# Patient Record
Sex: Male | Born: 1940
Health system: Southern US, Community
[De-identification: ages and names within clinical notes are randomized; demographics above are authoritative.]

## PROBLEM LIST (undated history)

## (undated) DIAGNOSIS — G43909 Migraine, unspecified, not intractable, without status migrainosus: Secondary | ICD-10-CM

## (undated) DIAGNOSIS — I10 Essential (primary) hypertension: Secondary | ICD-10-CM

## (undated) DIAGNOSIS — H269 Unspecified cataract: Secondary | ICD-10-CM

## (undated) DIAGNOSIS — J690 Pneumonitis due to inhalation of food and vomit: Secondary | ICD-10-CM

## (undated) DIAGNOSIS — K219 Gastro-esophageal reflux disease without esophagitis: Secondary | ICD-10-CM

## (undated) DIAGNOSIS — K279 Peptic ulcer, site unspecified, unspecified as acute or chronic, without hemorrhage or perforation: Secondary | ICD-10-CM

## (undated) DIAGNOSIS — K254 Chronic or unspecified gastric ulcer with hemorrhage: Secondary | ICD-10-CM

## (undated) DIAGNOSIS — N390 Urinary tract infection, site not specified: Secondary | ICD-10-CM

## (undated) DIAGNOSIS — A419 Sepsis, unspecified organism: Secondary | ICD-10-CM

## (undated) DIAGNOSIS — T7840XA Allergy, unspecified, initial encounter: Secondary | ICD-10-CM

## (undated) DIAGNOSIS — Z8673 Personal history of transient ischemic attack (TIA), and cerebral infarction without residual deficits: Secondary | ICD-10-CM

## (undated) DIAGNOSIS — Z9289 Personal history of other medical treatment: Secondary | ICD-10-CM

## (undated) DIAGNOSIS — R011 Cardiac murmur, unspecified: Secondary | ICD-10-CM

## (undated) DIAGNOSIS — M1990 Unspecified osteoarthritis, unspecified site: Secondary | ICD-10-CM

## (undated) DIAGNOSIS — T4145XA Adverse effect of unspecified anesthetic, initial encounter: Secondary | ICD-10-CM

## (undated) HISTORY — DX: Peptic ulcer, site unspecified, unspecified as acute or chronic, without hemorrhage or perforation: K27.9

## (undated) HISTORY — DX: Pneumonitis due to inhalation of food and vomit: J69.0

## (undated) HISTORY — DX: Sepsis, unspecified organism: A41.9

## (undated) HISTORY — PX: CATARACT EXTRACTION W/ INTRAOCULAR LENS  IMPLANT, BILATERAL: SHX1307

## (undated) HISTORY — DX: Allergy, unspecified, initial encounter: T78.40XA

## (undated) HISTORY — DX: Unspecified cataract: H26.9

## (undated) HISTORY — PX: COLONOSCOPY: SHX174

## (undated) HISTORY — DX: Urinary tract infection, site not specified: N39.0

## (undated) HISTORY — PX: LAPAROSCOPIC CHOLECYSTECTOMY: SUR755

---

## 1986-04-05 DIAGNOSIS — K254 Chronic or unspecified gastric ulcer with hemorrhage: Secondary | ICD-10-CM

## 1986-04-05 DIAGNOSIS — Z9289 Personal history of other medical treatment: Secondary | ICD-10-CM

## 1986-04-05 HISTORY — DX: Chronic or unspecified gastric ulcer with hemorrhage: K25.4

## 1986-04-05 HISTORY — DX: Personal history of other medical treatment: Z92.89

## 1991-04-06 DIAGNOSIS — T8859XA Other complications of anesthesia, initial encounter: Secondary | ICD-10-CM

## 1991-04-06 HISTORY — DX: Other complications of anesthesia, initial encounter: T88.59XA

## 1991-04-06 HISTORY — PX: EXCISIONAL HEMORRHOIDECTOMY: SHX1541

## 2009-10-09 ENCOUNTER — Encounter (INDEPENDENT_AMBULATORY_CARE_PROVIDER_SITE_OTHER): Payer: Self-pay | Admitting: *Deleted

## 2009-10-09 ENCOUNTER — Ambulatory Visit: Payer: Self-pay | Admitting: Internal Medicine

## 2009-10-09 DIAGNOSIS — R1012 Left upper quadrant pain: Secondary | ICD-10-CM

## 2009-10-17 ENCOUNTER — Ambulatory Visit: Payer: Self-pay | Admitting: Internal Medicine

## 2009-10-20 ENCOUNTER — Telehealth: Payer: Self-pay | Admitting: Internal Medicine

## 2009-10-31 ENCOUNTER — Ambulatory Visit: Payer: Self-pay | Admitting: Internal Medicine

## 2009-10-31 ENCOUNTER — Telehealth: Payer: Self-pay | Admitting: Internal Medicine

## 2009-10-31 ENCOUNTER — Inpatient Hospital Stay (HOSPITAL_COMMUNITY): Admission: EM | Admit: 2009-10-31 | Discharge: 2009-11-02 | Payer: Self-pay | Admitting: Emergency Medicine

## 2009-10-31 ENCOUNTER — Encounter (INDEPENDENT_AMBULATORY_CARE_PROVIDER_SITE_OTHER): Payer: Self-pay | Admitting: Surgery

## 2010-01-07 ENCOUNTER — Emergency Department (HOSPITAL_COMMUNITY): Admission: EM | Admit: 2010-01-07 | Discharge: 2010-01-07 | Payer: Self-pay | Admitting: Emergency Medicine

## 2010-05-05 NOTE — Progress Notes (Signed)
Summary: wants PPI  ---- Converted from flag ---- ---- 10/20/2009 8:01 AM, Clide Cliff RN wrote: Jeanene Erb pt back this am...wants PPI or something for discomfort...he assumes this discomfort is related to his gastritis/not new same as before procedure ------------------------------     Follow-up for Phone Call       Follow-up by: Iva Boop MD, Clementeen Graham,  October 20, 2009 8:09 AM    Additional Follow-up for Phone Call Additional follow up Details #2::    let him know that I will recommend treatment once path back (this week) Iva Boop MD, [facility]  October 20, 2009 8:09 AM    Informed patient that Dr. will notify him of treatment plan once the pathology is back sometime this week. Follow-up by: Clide Cliff RN,  October 20, 2009 12:25 PM

## 2010-05-05 NOTE — Letter (Signed)
Summary: [facility] Instructions  [facility]  [address] [place], [place] [address]   Phone: [phone]  Fax: [phone]       CORBETT MOULDER    [birth year]    MRN: [id number]      Procedure Day Dorna Bloom: Farrell Ours, 10/17/09     Arrival Time: 1:00 PM      Procedure Time: 2:00 PM    Location of Procedure:                    _X_  [facility] (4th Floor)                    PREPARATION FOR COLONOSCOPY WITH MOVIPREP   Starting 5 days prior to your procedure 10/12/09 do not eat nuts, seeds, popcorn, corn, beans, peas,  salads, or any raw vegetables.  Do not take any fiber supplements (e.g. Metamucil, Citrucel, and Benefiber).  THE DAY BEFORE YOUR PROCEDURE         THURSDAY, 10/16/09  1.  Drink clear liquids the entire day-NO SOLID FOOD  2.  Do not drink anything colored red or purple.  Avoid juices with pulp.  No orange juice.  3.  Drink at least 64 oz. (8 glasses) of fluid/clear liquids during the day to prevent dehydration and help the prep work efficiently.  CLEAR LIQUIDS INCLUDE: Water Jello Ice Popsicles Tea (sugar ok, no milk/cream) Powdered fruit flavored drinks Coffee (sugar ok, no milk/cream) Gatorade Juice: apple, white grape, white cranberry  Lemonade Clear bullion, consomm, broth Carbonated beverages (any kind) Strained chicken noodle soup Hard Candy                             4.  In the morning, mix first dose of MoviPrep solution:    Empty 1 Pouch A and 1 Pouch B into the disposable container    Add lukewarm drinking water to the top line of the container. Mix to dissolve    Refrigerate (mixed solution should be used within 24 hrs)  5.  Begin drinking the prep at 5:00 p.m. The MoviPrep container is divided by 4 marks.   Every 15 minutes drink the solution down to the next mark (approximately 8 oz) until the full liter is complete.   6.  Follow completed prep with 16 oz of clear liquid of your choice (Nothing red or purple).   Continue to drink clear liquids until bedtime.  7.  Before going to bed, mix second dose of MoviPrep solution:    Empty 1 Pouch A and 1 Pouch B into the disposable container    Add lukewarm drinking water to the top line of the container. Mix to dissolve    Refrigerate  THE DAY OF YOUR PROCEDURE      FRIDAY, 10/17/09  Beginning at 9:00 a.m. (5 hours before procedure):         1. Every 15 minutes, drink the solution down to the next mark (approx 8 oz) until the full liter is complete.  2. Follow completed prep with 16 oz. of clear liquid of your choice.    3. You may drink clear liquids until 12:00 PM (2 HOURS BEFORE PROCEDURE).   MEDICATION INSTRUCTIONS  Unless otherwise instructed, you should take regular prescription medications with a small sip of water   as early as possible the morning of your procedure.       OTHER INSTRUCTIONS  You  will need a responsible adult at least 68 years of age to accompany you and drive you home.   This person must remain in the waiting room during your procedure.  Wear loose fitting clothing that is easily removed.  Leave jewelry and other valuables at home.  However, you may wish to bring a book to read or  an iPod/MP3 player to listen to music as you wait for your procedure to start.  Remove all body piercing jewelry and leave at home.  Total time from sign-in until discharge is approximately 2-3 hours.  You should go home directly after your procedure and rest.  You can resume normal activities the  day after your procedure.  The day of your procedure you should not:   Drive   Make legal decisions   Operate machinery   Drink alcohol   Return to work  You will receive specific instructions about eating, activities and medications before you leave.  The above instructions have been reviewed and explained to me by   Francee Piccolo, CMA (AAMA)    I fully understand and can verbalize these instructions  _____________________________ Date 10/09/09

## 2010-05-05 NOTE — Procedures (Signed)
Summary: Upper Endoscopy  Patient: Steven Simmons Note: All result statuses are Final unless otherwise noted.  Tests: (1) Upper Endoscopy (EGD)   EGD Upper Endoscopy       DONE     [facility]     [address]     [place], [place]  [address]           ENDOSCOPY PROCEDURE REPORT           PATIENT:  [name], [name]  MR#:  [id number]     BIRTHDATE:  02-25-1941, 69 yrs. old  GENDER:  male           ENDOSCOPIST:  Iva Boop, MD, [facility]     Referred by:  Robert Bellow, M.D.           PROCEDURE DATE:  10/17/2009     PROCEDURE:  EGD with biopsy     ASA CLASS:  Class II     INDICATIONS:  abdominal pain, left upper quadr           MEDICATIONS:   Fentanyl 75 mcg IV, Versed 7 mg IV     TOPICAL ANESTHETIC:  Exactacain Spray           DESCRIPTION OF PROCEDURE:   After the risks benefits and     alternatives of the procedure were thoroughly explained, informed     consent was obtained.  The LB GIF-H180 [id number] endoscope was     introduced through the mouth and advanced to the second portion of     the duodenum, without limitations.  The instrument was slowly     withdrawn as the mucosa was fully examined.     <<PROCEDUREIMAGES>>           Mild gastritis was found in the body and the antrum of the     stomach. Multiple biopsies were obtained and sent to pathology.     Otherwise the examination was normal.    Retroflexed views revealed     no abnormalities.    The scope was then withdrawn from the patient     and the procedure completed.           COMPLICATIONS:  None           ENDOSCOPIC IMPRESSION:     1) Mild gastritis in the body and the antrum of the stomach     2) Otherwise normal examination     RECOMMENDATIONS:     1) Await biopsy results     will call           REPEAT EXAM:  In for as needed.           Iva Boop, MD, Clementeen Graham           CC:  The Patient     [facility]           n.     eSIGNED:   Iva Boop at 10/17/2009 02:59 PM           Geronimo Boot, [id number]  Note: An exclamation mark (!) indicates a result that was not dispersed into the flowsheet. Document Creation Date: 10/17/2009 3:01 PM _______________________________________________________________________  (1) Order result status: Final Collection or observation date-time: 10/17/2009 14:41 Requested date-time:  Receipt date-time:  Reported date-time:  Referring Physician:   Ordering Physician: Stan Head [phone]) Specimen Source:  Source: Launa Grill Order Number: [id number] Lab site:

## 2010-05-05 NOTE — Procedures (Signed)
Summary: Colonoscopy  Patient: Steven Simmons Note: All result statuses are Final unless otherwise noted.  Tests: (1) Colonoscopy (COL)   COL Colonoscopy           DONE     [facility]     [address]     [place], [place]  [address]           COLONOSCOPY PROCEDURE REPORT           PATIENT:  Steven Simmons, Steven Simmons  MR#:  [id number]     BIRTHDATE:  [birth year], 69 yrs. old  GENDER:  male     ENDOSCOPIST:  Iva Boop, MD, FACG     :     PROCEDURE DATE:  10/17/2009     PROCEDURE:  Average-risk screening colonoscopy     G0121     ASA CLASS:  Class II     INDICATIONS:  Elevated Risk Screening, family history of colon     cancer father in 2's     MEDICATIONS:   Fentanyl 25 mcg IV, Versed 3 mg IV, There was     residual sedation effect present from prior procedure.           DESCRIPTION OF PROCEDURE:   After the risks benefits and     alternatives of the procedure were thoroughly explained, informed     consent was obtained.  Digital rectal exam was performed and     revealed no abnormalities and normal prostate.   The LB CF-H180AL     [id number] endoscope was introduced through the anus and advanced to     the cecum, which was identified by both the appendix and ileocecal     valve, without limitations.  The quality of the prep was     excellent, using MoviPrep.  The instrument was then slowly     withdrawn as the colon was fully examined.     Inserton: 2:09 minutes withdrawal: 8:29 minutes     <<PROCEDUREIMAGES>>           FINDINGS:  Severe diverticulosis was found in the right colon.     Severe diverticulosis was found in the sigmoid colon.  This was     otherwise a normal examination of the colon.   Retroflexed views     in the rectum revealed no abnormalities.    The scope was then     withdrawn from the patient and the procedure completed.           COMPLICATIONS:  None     ENDOSCOPIC IMPRESSION:     1) Severe diverticulosis in the right colon     2) Severe  diverticulosis in the sigmoid colon     3) Otherwise normal examination, excellent prep           REPEAT EXAM:  In 5 year(s) for routine colonoscopy for family     history of colon cancer.           Iva Boop, MD, Clementeen Graham           CC:  The Patient           n.     eSIGNED:   Iva Boop at 10/17/2009 03:07 PM           Geronimo Boot, [id number]  Note: An exclamation mark (!) indicates a result that was not dispersed into the flowsheet. Document Creation Date: 10/17/2009 3:08 PM _______________________________________________________________________  (1) Order result status: Final Collection or  observation date-time: 10/17/2009 14:58 Requested date-time:  Receipt date-time:  Reported date-time:  Referring Physician:   Ordering Physician: Stan Head [phone]) Specimen Source:  Source: Launa Grill Order Number: [id number] Lab site:   Appended Document: Colonoscopy     Procedures Next Due Date:    Colonoscopy: 10/2014  Appended Document: Colonoscopy     Procedures Next Due Date:    Colonoscopy: 10/2014

## 2010-05-05 NOTE — Progress Notes (Signed)
Summary: abd pain, distention  Phone Note Call from Patient   Caller: Patient Summary of Call: starting 1 week ago, episodic abdominal pain, fairly intense and moreso this AM 2 Zantac ? a bit better hot shower ? better just a bit GI cocktail at urgent care last week not helpful, was given narcotic also RUQ area with bloating and abdominal distention started Prilosec after EGD/colon 7/15 (gastritis, diverticulosis) no fever no vomiting some back pain and abd muscles are stretched with abdominal distention I advised he go to [facility] ED to be evaluated given fairly sudden abdominal pain, intermittently x 1 week Prilosec can sometimes cause pain but this merits acute evaluation cc: [facility] ED via fax Initial call taken by: Iva Boop MD, Clementeen Graham,  October 31, 2009 4:50 AM

## 2010-05-05 NOTE — Assessment & Plan Note (Signed)
Summary: BLOATING//GAS PAINS--CH.   History of Present Illness Visit Type: Initial Visit Primary GI MD: Stan Head MD [facility] Primary Provider: Joneen Caraway Care Chief Complaint: Bloating/Gas x 52month History of Present Illness:   Worsening problems in last month. LUQ pain that radiates to flank and down side into leg. bicarbonate and water helps, lying on left side helps. Right side aggravates it. Some tenderness also, at times. Sometimes after he eats but not always. Is avoiding certain foods (dairy, chocolate, greasy foods, overeating) to help heartbun. Remembers significant bloating after a sub sandhich also. Has been diagnosed with acid reflux x 2 years. Has used some Zegerid OTC at times and other Rx PPI's at times. Not on any now.   GI Review of Systems    Reports abdominal pain, acid reflux, and  bloating.     Location of  Abdominal pain: right side.    Denies belching, chest pain, dysphagia with liquids, dysphagia with solids, heartburn, loss of appetite, nausea, vomiting, vomiting blood, weight loss, and  weight gain.        Denies anal fissure, black tarry stools, change in bowel habit, constipation, diarrhea, diverticulosis, fecal incontinence, heme positive stool, hemorrhoids, irritable bowel syndrome, jaundice, light color stool, liver problems, rectal bleeding, and  rectal pain. Preventive Screening-Counseling & Management  Alcohol-Tobacco     Alcohol drinks/day: 0     Smoking Status: quit     Passive Smoke Exposure: no     Passive Smoke Counseling: not indicated; no passive smoke exposure  Caffeine-Diet-Exercise     Caffeine use/day: up to 2     Does Patient Exercise: yes     Type of exercise: walking     Times/week: <3      Drug Use:  no.      Current Medications (verified): 1)  Lisinopril-Hydrochlorothiazide 20-25 Mg Tabs (Lisinopril-Hydrochlorothiazide) .... Once Daily  Allergies (verified): 1)  ! Penicillin 2)  ! * Antihistamine 3)  ! 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  Alcohol Use - no Daily Caffeine Use up to 2 Illicit Drug Use - no Lives alone, single Smoking Status:  quit Drug Use:  no Alcohol drinks/day:  0 Passive Smoke Exposure:  no Caffeine use/day:  up to 2 Does Patient Exercise:  yes  Review of Systems       The patient complains of arthritis/joint pain, back pain, and sleeping problems.         All other ROS negative except as per HPI.   Vital Signs:  Patient profile:   70 year old male Height:      71 inches Weight:      225.50 pounds BMI:     31.56 Pulse rate:   88 / minute Pulse rhythm:   regular BP sitting:   164 / 96  (left arm) Cuff size:   large  Vitals Entered By: June McMurray CMA Duncan Dull) (October 09, 2009 2:23 PM)  Physical Exam  General:  obese.  NAD Eyes:  PERRLA, no icterus. Mouth:  No deformity or lesions, dentition  Posterior pharynx. Neck:  Supple; no masses or thyromegaly. Lungs:  Clear throughout to auscultation. Heart:  Regular rate and rhythm; no murmurs, rubs,  or bruits. Abdomen:  obese, soft no HSM/mass small umbilical hernia, non-tender and reducible BS+ Rectal:  deferred until time of colonoscopy.   Extremities:  No clubbing, cyanosis, edema or deformities noted. Neurologic:  Alert and  oriented x3 Skin:  multiple tags, nevi Cervical Nodes:  No significant cervical or supraclavicular adenopathy.  Psych:  Alert and cooperative. Normal mood and affect.   Impression & Recommendations:  Problem # 1:  LUQ PAIN  (ICD-789.02) Assessment New  probably from GERD ? ulcer check H. pylori gven prior ulcers Risks, benefits,and indications of endoscopic procedure(s) were reviewed with the patient and all questions answered.  Orders: Colon/Endo (Colon/Endo)  Problem # 2:  SCREENING, COLON CANCER (ICD-V76.51) Assessment: New  Orders: Colon/Endo (Colon/Endo) Risks, benefits,and indications of endoscopic procedure(s) were reviewed with the patient and all questions answered.  Problem # 3:  ADENOCARCINOMA, COLON, FAMILY HX (ICD-V16.0) Assessment: New  Patient Instructions: 1)  Please pick up your medications at your pharmacy. MOVIPREP 2)  We will see you at your procedure on 10/17/09.  3)  HOME INSTEAD--[phone]--[email or web address] 4)  COMFORT KEEPERS--[phone]--[email or web address] 5)  [facility] Patient Information Guide given to patient.  6)  Colonoscopy and Flexible Sigmoidoscopy brochure given.  7)  Upper Endoscopy brochure given.  8)  Copy sent to : [facility] 9)  The medication list was reviewed and reconciled.  All changed / newly prescribed medications were explained.  A complete medication list was provided to the patient / caregiver. Prescriptions: MOVIPREP 100 GM  SOLR (PEG-KCL-NACL-NASULF-NA ASC-C) As per prep instructions.  #1 x 0   Entered by:   Francee Piccolo CMA (AAMA)   Authorized by:   Iva Boop MD, [facility]   Signed by:   Francee Piccolo CMA (AAMA) on 10/09/2009   Method used:   Electronically to        [facility] [address] [phone]* (retail)       [address]       [place], [place]  [address]       Ph: [phone] or [phone]       Fax: [phone]   RxID:   [id number]

## 2010-06-18 LAB — POCT CARDIAC MARKERS: Myoglobin, poc: 64.6 ng/mL (ref 12–200)

## 2010-06-18 LAB — POCT I-STAT, CHEM 8
BUN: 12 mg/dL (ref 6–23)
Creatinine, Ser: 0.9 mg/dL (ref 0.4–1.5)
HCT: 43 % (ref 39.0–52.0)
Sodium: 139 mEq/L (ref 135–145)
TCO2: 29 mmol/L (ref 0–100)

## 2010-06-20 LAB — CBC
HCT: 37.3 % — ABNORMAL LOW (ref 39.0–52.0)
MCHC: 35 g/dL (ref 30.0–36.0)
RBC: 4.03 MIL/uL — ABNORMAL LOW (ref 4.22–5.81)
RDW: 14.4 % (ref 11.5–15.5)

## 2010-06-20 LAB — URINALYSIS, ROUTINE W REFLEX MICROSCOPIC
Glucose, UA: NEGATIVE mg/dL
Ketones, ur: 15 mg/dL — AB
Leukocytes, UA: NEGATIVE
Nitrite: NEGATIVE
pH: 8.5 — ABNORMAL HIGH (ref 5.0–8.0)

## 2010-06-20 LAB — COMPREHENSIVE METABOLIC PANEL
Albumin: 3.7 g/dL (ref 3.5–5.2)
CO2: 31 mEq/L (ref 19–32)
Chloride: 98 mEq/L (ref 96–112)
GFR calc Af Amer: >60 mL/min (ref >60)
GFR calc non Af Amer: >60 mL/min (ref >60)
Glucose, Bld: 122 mg/dL — ABNORMAL HIGH (ref 70–99)
Total Bilirubin: 0.9 mg/dL (ref 0.3–1.2)
Total Protein: 7.5 g/dL (ref 6.0–8.3)

## 2010-06-20 LAB — DIFFERENTIAL
Basophils Relative: 1 % (ref 0–1)
Eosinophils Absolute: 0.2 10*3/uL (ref 0.0–0.7)
Lymphs Abs: 1.4 10*3/uL (ref 0.7–4.0)
Monocytes Relative: 7 % (ref 3–12)
Neutro Abs: 6.1 10*3/uL (ref 1.7–7.7)
Neutrophils Relative %: 74 % (ref 43–77)

## 2010-06-20 LAB — URINE MICROSCOPIC-ADD ON

## 2010-06-20 LAB — LIPASE, BLOOD: Lipase: 26 U/L (ref 11–59)

## 2010-06-20 LAB — POCT I-STAT 4, (NA,K, GLUC, HGB,HCT): Potassium: 3 mEq/L — ABNORMAL LOW (ref 3.5–5.1)

## 2011-05-18 ENCOUNTER — Ambulatory Visit (INDEPENDENT_AMBULATORY_CARE_PROVIDER_SITE_OTHER): Payer: Medicare Other | Admitting: Internal Medicine

## 2011-05-18 VITALS — BP 181/77 | HR 81 | Temp 98.3°F | Resp 16 | Ht 70.0 in | Wt 237.0 lb

## 2011-05-18 DIAGNOSIS — I10 Essential (primary) hypertension: Secondary | ICD-10-CM

## 2011-05-18 MED ORDER — LISINOPRIL-HYDROCHLOROTHIAZIDE 20-12.5 MG PO TABS: 1.0000 | ORAL_TABLET | Freq: Every day | ORAL | Status: DC

## 2011-05-18 NOTE — Patient Instructions (Signed)
TAKE YOUR BP MEDS AS PRESCRIBED. RETURN IN 2 WEEKS TO RE EVAL BP

## 2011-05-18 NOTE — Progress Notes (Signed)
  Subjective:    Patient ID: Steven Simmons, male    DOB: [birth year], 71 y.o.   MRN: [id number]  HPI RAN OUT OF BP MEDS BP IS RUNNING HIGH ONSET 2 DAYS AGO NO ASSOC SYMPTOMS   Review of Systems  Constitutional: Negative.   HENT: Negative.   Eyes: Negative.   Respiratory: Negative.   Cardiovascular: Negative.   Gastrointestinal: Negative.   Genitourinary: Negative.   Musculoskeletal: Negative.   Skin: Negative.   Neurological: Negative.   Hematological: Negative.   Psychiatric/Behavioral: Negative.        Objective:   Physical Exam  Constitutional: He is oriented to person, place, and time. He appears well-developed and well-nourished.  HENT:  Head: Normocephalic and atraumatic.  Eyes: Conjunctivae and EOM are normal. Pupils are equal, round, and reactive to light.  Neck: Normal range of motion.  Cardiovascular: Normal rate, regular rhythm and normal heart sounds.   Pulmonary/Chest: Effort normal and breath sounds normal.  Abdominal: Soft. Bowel sounds are normal.  Musculoskeletal: Normal range of motion.  Neurological: He is alert and oriented to person, place, and time.  Skin: Skin is warm and dry.  Psychiatric: He has a normal mood and affect. His behavior is normal. Judgment and thought content normal.          Assessment & Plan:  HYPERTENSION UNCONTROLLED MEDICATION REFILL

## 2011-10-06 ENCOUNTER — Encounter: Payer: Self-pay | Admitting: Family Medicine

## 2011-10-06 ENCOUNTER — Ambulatory Visit (INDEPENDENT_AMBULATORY_CARE_PROVIDER_SITE_OTHER): Payer: Medicare Other | Admitting: Family Medicine

## 2011-10-06 VITALS — BP 170/82 | HR 100 | Temp 98.3°F | Resp 16 | Ht 70.5 in | Wt 228.0 lb

## 2011-10-06 DIAGNOSIS — I10 Essential (primary) hypertension: Secondary | ICD-10-CM

## 2011-10-06 MED ORDER — LISINOPRIL-HYDROCHLOROTHIAZIDE 10-12.5 MG PO TABS: 1.0000 | ORAL_TABLET | Freq: Every day | ORAL | Status: DC

## 2011-10-06 NOTE — Progress Notes (Signed)
@UMFCLOGO @  Patient ID: Steven Simmons MRN: [id number], DOB: July 26, 1940, 71 y.o. Date of Encounter: 10/06/2011, 3:49 PM  Primary Physician: No primary provider on file.  Chief Complaint: HTN  HPI: 71 y.o. year old male with history below presents for hypertension follow up.  Ran out of medication recently  Diet consists of low salt No CP, HA, visual changes, or focal deficits.  Minimal exercise  No past medical history on file.   Home Meds: Prior to Admission medications   Medication Sig Start Date End Date Taking? Authorizing Provider  lisinopril-hydrochlorothiazide (PRINZIDE,ZESTORETIC) 20-12.5 MG per tablet Take 1 tablet by mouth daily. 05/18/11  Yes Sherlyn Hay, MD    Allergies:  Allergies  Allergen Reactions  . Veralipride     Gums bleed, tooth aches  . Antihistamines, Chlorpheniramine-Type   . Beta Adrenergic Blockers   . Ceclor (Cefaclor)   . Penicillins     History   Social History  . Marital Status: Single    Spouse Name: N/A    Number of Children: N/A  . Years of Education: N/A   Occupational History  . Not on file.   Social History Main Topics  . Smoking status: Former Games developer  . Smokeless tobacco: Not on file  . Alcohol Use: Not on file  . Drug Use: Not on file  . Sexually Active: Not on file   Other Topics Concern  . Not on file   Social History Narrative  . No narrative on file     No family history on file.  Review of Systems: Constitutional: negative for chills, fever, night sweats, weight changes, or fatigue  HEENT: negative for vision changes, hearing loss, congestion, rhinorrhea, ST, epistaxis, or sinus pressure Cardiovascular: negative for chest pain, palpitations, or DOE.  Some mild edema Respiratory: negative for hemoptysis, wheezing, shortness of breath, or cough Abdominal: negative for abdominal pain, nausea, vomiting, diarrhea, or constipation Dermatological: negative for rash Neurologic: negative for headache, dizziness, or  syncope All other systems reviewed and are otherwise negative with the exception to those above and in the HPI.   Physical Exam: Blood pressure 170/82, pulse 100, temperature 98.3 F (36.8 C), temperature source Oral, resp. rate 16, height 5' 10.5" (1.791 m), weight 228 lb (103.42 kg), SpO2 95.00%., Body mass index is 32.25 kg/(m^2). General: Well developed, well nourished, in no acute distress. Head: Normocephalic, atraumatic, eyes without discharge, sclera non-icteric, nares are without discharge. Bilateral auditory canals clear, TM's are without perforation, pearly grey and translucent with reflective cone of light bilaterally. Oral cavity moist, posterior pharynx without exudate, erythema, peritonsillar abscess, or post nasal drip.  Neck: Supple. No thyromegaly. Full ROM. No lymphadenopathy. No carotid bruits. Lungs: Clear bilaterally to auscultation without wheezes, rales, or rhonchi. Breathing is unlabored. Heart: RRR with S1 S2. No murmurs, rubs, or gallops appreciated.  Abdomen: Soft, non-tender, non-distended with normoactive bowel sounds. No hepatosplenomegaly. No rebound/guarding. No obvious abdominal masses. Msk:  Strength and tone normal for age. Extremities/Skin: Warm and dry. No clubbing or cyanosis. No edema. No rashes or suspicious lesions. Distal pulses 2+ and equal bilaterally. Neuro: Alert and oriented X 3. Moves all extremities spontaneously. Gait is normal. CNII-XII grossly in tact. DTR 2+, cerebellar function intact. Rhomberg normal. Psych:  Responds to questions appropriately with a normal affect.    CMP pending  ASSESSMENT AND PLAN:  71 y.o. year old male with hypertension. 1. Hypertension  lisinopril-hydrochlorothiazide (PRINZIDE,ZESTORETIC) 10-12.5 MG per tablet, Comprehensive metabolic panel     -  Signed,  Elvina Sidle, MD 10/06/2011 3:49 PM

## 2011-10-07 LAB — COMPREHENSIVE METABOLIC PANEL
ALT: 14 U/L (ref 0–53)
AST: 16 U/L (ref 0–37)
Albumin: 4.5 g/dL (ref 3.5–5.2)
Alkaline Phosphatase: 108 U/L (ref 39–117)
BUN: 16 mg/dL (ref 6–23)
CO2: 27 mEq/L (ref 19–32)
Calcium: 9.7 mg/dL (ref 8.4–10.5)
Chloride: 103 mEq/L (ref 96–112)
Creat: 0.76 mg/dL (ref 0.50–1.35)
Glucose, Bld: 121 mg/dL — ABNORMAL HIGH (ref 70–99)
Potassium: 4.1 mEq/L (ref 3.5–5.3)
Sodium: 140 mEq/L (ref 135–145)
Total Bilirubin: 0.6 mg/dL (ref 0.3–1.2)
Total Protein: 8 g/dL (ref 6.0–8.3)

## 2012-06-29 ENCOUNTER — Observation Stay (HOSPITAL_COMMUNITY): Payer: Medicare Other

## 2012-06-29 ENCOUNTER — Encounter (HOSPITAL_COMMUNITY): Payer: Self-pay | Admitting: *Deleted

## 2012-06-29 ENCOUNTER — Emergency Department (HOSPITAL_COMMUNITY): Payer: Medicare Other

## 2012-06-29 ENCOUNTER — Observation Stay (HOSPITAL_COMMUNITY)
Admission: EM | Admit: 2012-06-29 | Discharge: 2012-06-29 | Disposition: A | Discharge status: Home / Self Care | Payer: Medicare Other | Attending: Family Medicine | Admitting: Family Medicine

## 2012-06-29 DIAGNOSIS — G459 Transient cerebral ischemic attack, unspecified: Secondary | ICD-10-CM | POA: Diagnosis present

## 2012-06-29 DIAGNOSIS — G319 Degenerative disease of nervous system, unspecified: Secondary | ICD-10-CM | POA: Insufficient documentation

## 2012-06-29 DIAGNOSIS — I519 Heart disease, unspecified: Secondary | ICD-10-CM | POA: Insufficient documentation

## 2012-06-29 DIAGNOSIS — R471 Dysarthria and anarthria: Secondary | ICD-10-CM | POA: Insufficient documentation

## 2012-06-29 DIAGNOSIS — R4701 Aphasia: Principal | ICD-10-CM | POA: Insufficient documentation

## 2012-06-29 DIAGNOSIS — I10 Essential (primary) hypertension: Secondary | ICD-10-CM | POA: Diagnosis present

## 2012-06-29 DIAGNOSIS — R002 Palpitations: Secondary | ICD-10-CM | POA: Insufficient documentation

## 2012-06-29 HISTORY — DX: Essential (primary) hypertension: I10

## 2012-06-29 HISTORY — DX: Gastro-esophageal reflux disease without esophagitis: K21.9

## 2012-06-29 LAB — DIFFERENTIAL
Basophils Absolute: 0 10*3/uL (ref 0.0–0.1)
Eosinophils Absolute: 0.2 10*3/uL (ref 0.0–0.7)
Eosinophils Relative: 2 % (ref 0–5)
Lymphocytes Relative: 20 % (ref 12–46)
Lymphs Abs: 1.7 10*3/uL (ref 0.7–4.0)
Monocytes Absolute: 0.6 10*3/uL (ref 0.1–1.0)
Monocytes Relative: 7 % (ref 3–12)
Neutro Abs: 6 10*3/uL (ref 1.7–7.7)

## 2012-06-29 LAB — GLUCOSE, CAPILLARY: Glucose-Capillary: 144 mg/dL — ABNORMAL HIGH (ref 70–99)

## 2012-06-29 LAB — CBC
HCT: 42 % (ref 39.0–52.0)
Hemoglobin: 14.3 g/dL (ref 13.0–17.0)
MCV: 89.2 fL (ref 78.0–100.0)
Platelets: 293 10*3/uL (ref 150–400)
RBC: 4.71 MIL/uL (ref 4.22–5.81)
RDW: 13.3 % (ref 11.5–15.5)

## 2012-06-29 LAB — PROTIME-INR
INR: 0.98 (ref 0.00–1.49)
Prothrombin Time: 12.9 seconds (ref 11.6–15.2)

## 2012-06-29 LAB — COMPREHENSIVE METABOLIC PANEL
ALT: 14 U/L (ref 0–53)
AST: 22 U/L (ref 0–37)
Alkaline Phosphatase: 104 U/L (ref 39–117)
CO2: 30 mEq/L (ref 19–32)
Calcium: 9.6 mg/dL (ref 8.4–10.5)
Chloride: 100 mEq/L (ref 96–112)
Creatinine, Ser: 0.96 mg/dL (ref 0.50–1.35)
GFR calc Af Amer: >90 mL/min (ref >90)
Potassium: 4.1 mEq/L (ref 3.5–5.1)
Total Protein: 7.7 g/dL (ref 6.0–8.3)

## 2012-06-29 LAB — APTT: aPTT: 30 seconds (ref 24–37)

## 2012-06-29 LAB — HEMOGLOBIN A1C
Hgb A1c MFr Bld: 5.8 % — ABNORMAL HIGH (ref <5.7)
Mean Plasma Glucose: 120 mg/dL — ABNORMAL HIGH (ref <117)

## 2012-06-29 LAB — POCT I-STAT TROPONIN I: Troponin i, poc: 0 ng/mL (ref 0.00–0.08)

## 2012-06-29 MED ORDER — ATORVASTATIN CALCIUM 40 MG PO TABS
40.0000 mg | ORAL_TABLET | Freq: Every day | ORAL | Status: DC
  Filled 2012-06-29: qty 1

## 2012-06-29 MED ORDER — HYDROCHLOROTHIAZIDE 12.5 MG PO CAPS
12.5000 mg | ORAL_CAPSULE | Freq: Every day | ORAL | Status: DC
  Administered 2012-06-29: 12.5 mg via ORAL
  Filled 2012-06-29: qty 1

## 2012-06-29 MED ORDER — HEPARIN SODIUM (PORCINE) 5000 UNIT/ML IJ SOLN
5000.0000 [IU] | Freq: Three times a day (TID) | INTRAMUSCULAR | Status: DC
  Administered 2012-06-29 (×2): 5000 [IU] via SUBCUTANEOUS
  Filled 2012-06-29 (×4): qty 1

## 2012-06-29 MED ORDER — CLOPIDOGREL BISULFATE 75 MG PO TABS
75.0000 mg | ORAL_TABLET | Freq: Every day | ORAL | Status: DC
  Administered 2012-06-29: 75 mg via ORAL
  Filled 2012-06-29: qty 1

## 2012-06-29 MED ORDER — SODIUM CHLORIDE 0.9 % IJ SOLN: 3.0000 mL | INTRAMUSCULAR | Status: DC | PRN

## 2012-06-29 MED ORDER — ASPIRIN 81 MG PO CHEW
324.0000 mg | CHEWABLE_TABLET | Freq: Once | ORAL | Status: AC
  Administered 2012-06-29: 324 mg via ORAL
  Filled 2012-06-29: qty 4

## 2012-06-29 MED ORDER — CLOPIDOGREL BISULFATE 75 MG PO TABS: 75.0000 mg | ORAL_TABLET | Freq: Every day | ORAL | Status: DC

## 2012-06-29 MED ORDER — LISINOPRIL 10 MG PO TABS
10.0000 mg | ORAL_TABLET | Freq: Every day | ORAL | Status: DC
  Administered 2012-06-29: 10 mg via ORAL
  Filled 2012-06-29: qty 1

## 2012-06-29 MED ORDER — ATORVASTATIN CALCIUM 40 MG PO TABS: 40.0000 mg | ORAL_TABLET | Freq: Every day | ORAL | Status: DC

## 2012-06-29 MED ORDER — SODIUM CHLORIDE 0.9 % IJ SOLN: 3.0000 mL | Freq: Two times a day (BID) | INTRAMUSCULAR | Status: DC

## 2012-06-29 MED ORDER — SODIUM CHLORIDE 0.9 % IV SOLN: 250.0000 mL | INTRAVENOUS | Status: DC | PRN

## 2012-06-29 NOTE — H&P (Signed)
Family Medicine Teaching [facility] Admission History and Physical Service Pager: [phone]  Patient name: Steven Simmons Medical record number: [id number] Date of birth: [birth year] Age: 72 y.o. Gender: male  Primary Care Provider: [place]  Chief Complaint: Aphasia  Assessment and Plan: Steven Simmons is a 72 y.o. year old male with history of HTN presenting with aphasia and dysarthria.  1. TIA: patient with transient aphasia and dysarthria while talking on the phone. Currently without any symptoms and with no acute disease found on CT head. -will admit to telemetry for TIA observation -risk stratification labs-fasting lipid panel and A1c -will obtain MRI and carotid dopplers -start on ASA 325 tonight, can transition to 81 mg tomorrow -will allow permissive HTN in setting of TIA -neuro checks q4 hours -stroke swallow screen prior to placing diet orders  2. Palpitations: patient experienced these during his episode. NSR on EKG. Resolved prior to presentation to ED. -will monitor on telemetry for any events while here  3. HTN: will hold home medication. Want to allow permissive HTN in setting of TIA. Will plan to restart upon discharge.  FEN/GI: NPO until passes swallow eval, SLIV (if does not pass swallow eval, will start IVF) Prophylaxis: SQ heparin Disposition: admit to tele, discharge pending completion of TIA work-up Code Status: full  History of Present Illness: Steven Simmons is a 72 y.o. year old male with a history of HTN presenting with acute onset difficulty speaking.  Patient states he was on the phone around 11 pm with a friend when they noted him to have difficulty speaking. Patient felt as though he could not get his words out. His friend, who is in the room in the ED, noted that it sounded as though he was choking while trying to speak. Patient states this lasted for 10-15 minutes. Additionally the patient states he did not feel well at the time. States his pulse was  pounding and this lasted for 20 minutes. He took 2 lisinopril/HCTZ at this time because he took his BP and didn't get a read back on the machine.  He denies CP, dyspnea, headache, weakness, tingling, or numbness. Notably patient states he used to smoke, but quit in 1980.  In the ED the patients symptoms had completely resolved and a CT head was obtained without any acute abnormalities. EKG revealed NSR. POC troponin was negative. CBC and CMET within normal with exception of BUN of 24.  Patient Active Problem List  Diagnosis  . LUQ PAIN   Past Medical History: Past Medical History  Diagnosis Date  . Peptic ulcer disease   . Hypertension   . GERD (gastroesophageal reflux disease)    Past Surgical History: Past Surgical History  Procedure Laterality Date  . Cholecystectomy     Social History: History  Substance Use Topics  . Smoking status: Former Games developer  . Smokeless tobacco: Not on file  . Alcohol Use: No   For any additional social history documentation, please refer to relevant sections of EMR.  Family History: Family History  Problem Relation Age of Onset  . Heart disease Mother   . Hypertension Mother   . Heart disease Father   . Hypertension Father   CVA in father MI in mother  Allergies: Allergies  Allergen Reactions  . Ceclor (Cefaclor) Other (See Comments)    Makes my heart slow and I feel like I am fading away  . Veralipride     Gums bleed, tooth aches  . Antihistamines, Chlorpheniramine-Type Other (See Comments)  Dizziness and nose bleeds  . Beta Adrenergic Blockers Other (See Comments)    Gums bleed  . Celebrex (Celecoxib) Other (See Comments)    Stomach pain  . Ibuprofen Other (See Comments)    Stomach pain    . Nutrasweet Aspartame (Aspartame) Other (See Comments)    Memory loss  . Penicillins Other (See Comments)    Makes my heart slow and I feel like I am fading away   No current facility-administered medications on file prior to encounter.    Current Outpatient Prescriptions on File Prior to Encounter  Medication Sig Dispense Refill  . lisinopril-hydrochlorothiazide (PRINZIDE,ZESTORETIC) 10-12.5 MG per tablet Take 1 tablet by mouth daily.  90 tablet  3   Review Of Systems: Per HPI with the following additions: none Otherwise 12 point review of systems was performed and was unremarkable.  Physical Exam: BP 171/74  Pulse 86  Temp(Src) 97.5 F (36.4 C) (Oral)  Resp 12  SpO2 97% Exam: General: NAD, resting comfortably in bed HEENT: NCAT, MMM, PERRL Cardiovascular: rrr, no mrg appreciated Respiratory: CTAB, no crackles or wheezes Abdomen: s, NT, ND Extremities: no edema Skin: no lesions noted Neuro: CN 2-12 intact, 5/5 strength throughout, sensation to light touch intact, 2+ patellar and biceps reflexes, achilles reflexes difficult to appreciate bilaterally, normal gait, negative romberg, normal finger to nose  Labs and Imaging:  Results for orders placed during the hospital encounter of 06/29/12 (from the past 24 hour(s))  PROTIME-INR     Status: None   Collection Time    06/29/12 12:28 AM      Result Value Range   Prothrombin Time 12.9  11.6 - 15.2 seconds   INR 0.98  0.00 - 1.49  APTT     Status: None   Collection Time    06/29/12 12:28 AM      Result Value Range   aPTT 30  24 - 37 seconds  CBC     Status: None   Collection Time    06/29/12 12:28 AM      Result Value Range   WBC 8.5  4.0 - 10.5 K/uL   RBC 4.71  4.22 - 5.81 MIL/uL   Hemoglobin 14.3  13.0 - 17.0 g/dL   HCT 16.1  09.6 - 04.5 %   MCV 89.2  78.0 - 100.0 fL   MCH 30.4  26.0 - 34.0 pg   MCHC 34.0  30.0 - 36.0 g/dL   RDW 40.9  81.1 - 91.4 %   Platelets 293  150 - 400 K/uL  DIFFERENTIAL     Status: None   Collection Time    06/29/12 12:28 AM      Result Value Range   Neutrophils Relative 71  43 - 77 %   Neutro Abs 6.0  1.7 - 7.7 K/uL   Lymphocytes Relative 20  12 - 46 %   Lymphs Abs 1.7  0.7 - 4.0 K/uL   Monocytes Relative 7  3 - 12 %    Monocytes Absolute 0.6  0.1 - 1.0 K/uL   Eosinophils Relative 2  0 - 5 %   Eosinophils Absolute 0.2  0.0 - 0.7 K/uL   Basophils Relative 0  0 - 1 %   Basophils Absolute 0.0  0.0 - 0.1 K/uL  COMPREHENSIVE METABOLIC PANEL     Status: Abnormal   Collection Time    06/29/12 12:28 AM      Result Value Range   Sodium 138  135 - 145 mEq/L  Potassium 4.1  3.5 - 5.1 mEq/L   Chloride 100  96 - 112 mEq/L   CO2 30  19 - 32 mEq/L   Glucose, Bld 135 (*) 70 - 99 mg/dL   BUN 24 (*) 6 - 23 mg/dL   Creatinine, Ser 8.11  0.50 - 1.35 mg/dL   Calcium 9.6  8.4 - 91.4 mg/dL   Total Protein 7.7  6.0 - 8.3 g/dL   Albumin 3.6  3.5 - 5.2 g/dL   AST 22  0 - 37 U/L   ALT 14  0 - 53 U/L   Alkaline Phosphatase 104  39 - 117 U/L   Total Bilirubin 0.4  0.3 - 1.2 mg/dL   GFR calc non Af Amer 82 (*) >90 mL/min   GFR calc Af Amer >90  >90 mL/min  POCT I-STAT TROPONIN I     Status: None   Collection Time    06/29/12  1:04 AM      Result Value Range   Troponin i, poc 0.00  0.00 - 0.08 ng/mL   Comment 3            Ct Head Wo Contrast  06/29/2012  IMPRESSION: No acute intracranial findings or mass lesions. Age related cerebral atrophy and periventricular white matter disease.   Original Report Authenticated By: Rudie Meyer, M.D.    Marikay Alar, MD 06/29/2012, 2:54 AM  Upper Level Addendum:  I examined the patient with Dr. Birdie Sons. I have reviewed the note, made necessary revisions and agree with above.  Briefly, 72 yo M with TIA. ABCD2 score of 2. 1% risk of recurrent TIA in next 24 hrs. Admitting for TIA work up. Allowing permissive HTN for now, with plan to restart prinzide tomorrow. Did not order ECHO, suspect ischemic>embolic TIA. Will discuss utility of ECHO with my attending physician in the morning.   Itzel Lowrimore 06/29/12, 3:21 AM.

## 2012-06-29 NOTE — Discharge Summary (Signed)
Physician Discharge Summary  Patient ID: Steven Simmons MRN: [id number] DOB: 09-07-1940 Age: 72 y.o.  Admit date: 06/29/2012 Discharge date: 06/29/2012 Admitting Physician: Sanjuana Letters, MD  PCP: has f/u with Dr. Birdie Sons  Consultants: none     Discharge Diagnosis: Principal Problem:   TIA (transient ischemic attack) Active Problems:   HTN (hypertension)    Hospital Course Steven Simmons is a 72 y.o. year old male with history of HTN presenting with aphasia and dysarthria.   1. TIA: patient with transient aphasia and dysarthria while talking on the phone night prior to admission. Presented to the ED after symptoms resolved. CT head in the ED with no acute abnormalities. Risk stratification labs obtained and patient noted to have elevated LDL and started on Lipitor. A1c 5.8. Carotid dopplers revealed no significant stenosis. Echo with EF 60-65% with grade 1 diastolic dysfunction. Mitral valve with thickened and calcified anterior valve leaflet, cannot totally exclude vegetation. Patient discharged home on plavix after being started on ASA during hospitalization. Patient was stable from neurological stand point at time of discharge.  2. Palpitations: patient experienced these during his episode. NSR on EKG. Resolved prior to presentation to ED. Remained in NSR on tele during hospitalization. Will need to have event monitor as outpatient to monitor for further arrhythmias.   3. HTN: held home BP medication in acute setting of TIA to allow for permissive HTN. Patient was restarted on medication prior to discharge.  Problem List 1. TIA 2. Palpitations 3. HTN         Discharge PE   Filed Vitals:   06/29/12 1342  BP: 139/66  Pulse: 72  Temp: 98.1 F (36.7 C)  Resp: 18   Gen: NAD sitting on bed  CV: rrr, no mrg appreciated  Pulm: CTAB, no wheezes or crackles  Neuro: CN 2-12 intact, 5/5 strength throughout, sensation to light touch in tact, 2+ patellar reflex, gait  normal   Procedures/Imaging:  Ct Head Wo Contrast  06/29/2012  *RADIOLOGY REPORT*  Clinical Data: Aphasia  CT HEAD WITHOUT CONTRAST  Technique:  Contiguous axial images were obtained from the base of the skull through the vertex without contrast.  Comparison: None.  Findings: The ventricles are normal in size and in the midline without mass effect or shift.  No extra-axial fluid collections are identified.  Moderate periventricular white matter disease is noted.  No findings for acute hemispheric infarction and/or intracranial hemorrhage.  No mass lesions.  The brainstem and cerebellum grossly normal.  Vascular calcifications are noted.  The bony structures appear normal.  The paranasal sinuses and mastoid air cells are clear.  IMPRESSION: No acute intracranial findings or mass lesions. Age related cerebral atrophy and periventricular white matter disease.   Original Report Authenticated By: Rudie Meyer, M.D.     Labs  CBC  Recent Labs Lab 06/29/12 0028  WBC 8.5  HGB 14.3  HCT 42.0  PLT 293   BMET  Recent Labs Lab 06/29/12 0028  NA 138  K 4.1  CL 100  CO2 30  BUN 24*  CREATININE 0.96  CALCIUM 9.6  PROT 7.7  BILITOT 0.4  ALKPHOS 104  ALT 14  AST 22  GLUCOSE 135*   Results for orders placed during the hospital encounter of 06/29/12 (from the past 72 hour(s))  PROTIME-INR     Status: None   Collection Time    06/29/12 12:28 AM      Result Value Range   Prothrombin Time 12.9  11.6 - 15.2  seconds   INR 0.98  0.00 - 1.49  APTT     Status: None   Collection Time    06/29/12 12:28 AM      Result Value Range   aPTT 30  24 - 37 seconds  CBC     Status: None   Collection Time    06/29/12 12:28 AM      Result Value Range   WBC 8.5  4.0 - 10.5 K/uL   RBC 4.71  4.22 - 5.81 MIL/uL   Hemoglobin 14.3  13.0 - 17.0 g/dL   HCT 16.1  09.6 - 04.5 %   MCV 89.2  78.0 - 100.0 fL   MCH 30.4  26.0 - 34.0 pg   MCHC 34.0  30.0 - 36.0 g/dL   RDW 40.9  81.1 - 91.4 %   Platelets 293   150 - 400 K/uL  DIFFERENTIAL     Status: None   Collection Time    06/29/12 12:28 AM      Result Value Range   Neutrophils Relative 71  43 - 77 %   Neutro Abs 6.0  1.7 - 7.7 K/uL   Lymphocytes Relative 20  12 - 46 %   Lymphs Abs 1.7  0.7 - 4.0 K/uL   Monocytes Relative 7  3 - 12 %   Monocytes Absolute 0.6  0.1 - 1.0 K/uL   Eosinophils Relative 2  0 - 5 %   Eosinophils Absolute 0.2  0.0 - 0.7 K/uL   Basophils Relative 0  0 - 1 %   Basophils Absolute 0.0  0.0 - 0.1 K/uL  COMPREHENSIVE METABOLIC PANEL     Status: Abnormal   Collection Time    06/29/12 12:28 AM      Result Value Range   Sodium 138  135 - 145 mEq/L   Potassium 4.1  3.5 - 5.1 mEq/L   Chloride 100  96 - 112 mEq/L   CO2 30  19 - 32 mEq/L   Glucose, Bld 135 (*) 70 - 99 mg/dL   BUN 24 (*) 6 - 23 mg/dL   Creatinine, Ser 7.82  0.50 - 1.35 mg/dL   Calcium 9.6  8.4 - 95.6 mg/dL   Total Protein 7.7  6.0 - 8.3 g/dL   Albumin 3.6  3.5 - 5.2 g/dL   AST 22  0 - 37 U/L   ALT 14  0 - 53 U/L   Alkaline Phosphatase 104  39 - 117 U/L   Total Bilirubin 0.4  0.3 - 1.2 mg/dL   GFR calc non Af Amer 82 (*) >90 mL/min   GFR calc Af Amer >90  >90 mL/min   Comment:            The eGFR has been calculated     using the CKD EPI equation.     This calculation has not been     validated in all clinical     situations.     eGFR's persistently     <90 mL/min signify     possible Chronic Kidney Disease.  POCT I-STAT TROPONIN I     Status: None   Collection Time    06/29/12  1:04 AM      Result Value Range   Troponin i, poc 0.00  0.00 - 0.08 ng/mL   Comment 3            Comment: Due to the release kinetics of cTnI,     a negative result  within the first hours     of the onset of symptoms does not rule out     myocardial infarction with certainty.     If myocardial infarction is still suspected,     repeat the test at appropriate intervals.  LIPID PANEL     Status: Abnormal   Collection Time    06/29/12  5:22 AM      Result Value  Range   Cholesterol 187  0 - 200 mg/dL   Triglycerides 82  <161 mg/dL   HDL 34 (*) >09 mg/dL   Total CHOL/HDL Ratio 5.5     VLDL 16  0 - 40 mg/dL   LDL Cholesterol 604 (*) 0 - 99 mg/dL   Comment:            Total Cholesterol/HDL:CHD Risk     Coronary Heart Disease Risk Table                         Men   Women      1/2 Average Risk   3.4   3.3      Average Risk       5.0   4.4      2 X Average Risk   9.6   7.1      3 X Average Risk  23.4   11.0                Use the calculated Patient Ratio     above and the CHD Risk Table     to determine the patient's CHD Risk.                ATP III CLASSIFICATION (LDL):      <100     mg/dL   Optimal      540-981  mg/dL   Near or Above                        Optimal      130-159  mg/dL   Borderline      191-478  mg/dL   High      >295     mg/dL   Very High  HEMOGLOBIN A1C     Status: Abnormal   Collection Time    06/29/12  5:22 AM      Result Value Range   Hemoglobin A1C 5.8 (*) <5.7 %   Comment: (NOTE)                                                                               According to the ADA Clinical Practice Recommendations for 2011, when     HbA1c is used as a screening test:      >=6.5%   Diagnostic of Diabetes Mellitus               (if abnormal result is confirmed)     5.7-6.4%   Increased risk of developing Diabetes Mellitus     References:Diagnosis and Classification of Diabetes Mellitus,Diabetes     Care,2011,34(Suppl 1):S62-S69 and Standards of Medical Care in  Diabetes - 2011,Diabetes Care,2011,34 (Suppl 1):S11-S61.   Mean Plasma Glucose 120 (*) <117 mg/dL  TROPONIN I     Status: None   Collection Time    06/29/12  5:22 AM      Result Value Range   Troponin I <0.30  <0.30 ng/mL   Comment:            Due to the release kinetics of cTnI,     a negative result within the first hours     of the onset of symptoms does not rule out     myocardial infarction with certainty.     If myocardial infarction  is still suspected,     repeat the test at appropriate intervals.  GLUCOSE, CAPILLARY     Status: Abnormal   Collection Time    06/29/12  8:06 AM      Result Value Range   Glucose-Capillary 144 (*) 70 - 99 mg/dL   Comment 1 Documented in Chart     Comment 2 Notify RN    GLUCOSE, CAPILLARY     Status: Abnormal   Collection Time    06/29/12 11:07 AM      Result Value Range   Glucose-Capillary 114 (*) 70 - 99 mg/dL   Comment 1 Documented in Chart     Comment 2 Notify RN         Patient condition at time of discharge/disposition: stable  Disposition-home   Follow up issues: 1. Consider TEE per TTE read recommendation-couldn't rule out vegetation on mitral valve 2. Will need event monitor in outpatient setting to monitor for further palpitations  Discharge follow up:  Follow-up Information   Follow up with Marikay Alar, MD On 07/06/2012. (1:30 pm)    Contact information:   [address] [place] [address] [phone]          Discharge Orders   Future Appointments Provider Department Dept Phone   07/06/2012 2:00 PM Glori Luis, MD [facility] [phone]   Future Orders Complete By Expires     Increase activity slowly  As directed         Discharge Instructions: Please refer to Patient Instructions section of EMR for full details.  Patient was counseled important signs and symptoms that should prompt return to medical care, changes in medications, dietary instructions, activity restrictions, and follow up appointments.    Discharge Medications   Medication List    TAKE these medications       aspirin 81 MG chewable tablet  Chew 81 mg by mouth as needed (occasional use for headache).     atorvastatin 40 MG tablet  Commonly known as:  LIPITOR  Take 1 tablet (40 mg total) by mouth daily at 6 PM.     clopidogrel 75 MG tablet  Commonly known as:  PLAVIX  Take 1 tablet (75 mg total) by mouth daily with breakfast.      lisinopril-hydrochlorothiazide 10-12.5 MG per tablet  Commonly known as:  PRINZIDE,ZESTORETIC  Take 1 tablet by mouth daily.       Marikay Alar, MD of [facility] 07/01/2012 11:38 AM

## 2012-06-29 NOTE — Progress Notes (Signed)
*  PRELIMINARY RESULTS* Vascular Ultrasound Carotid Duplex (Doppler) has been completed.  Preliminary findings: Bilateral ICA: no stenosis. Bilateral Vertebrals: antegrade flow.   Steven Simmons FRANCES 06/29/2012, 1:47 PM

## 2012-06-29 NOTE — Progress Notes (Signed)
Patient refused transport for dc.  Explained the risks.  Lance Bosch, RN

## 2012-06-29 NOTE — ED Notes (Signed)
Report given to vivian, rn. Pt transported to floor via stretcher.

## 2012-06-29 NOTE — Progress Notes (Signed)
MRI called.  Patient refuses MRI.  He refuses even with medications.  Lance Bosch RN

## 2012-06-29 NOTE — Progress Notes (Signed)
Family Medicine Teaching Service Attending Note  I discussed patient Steven Simmons  with Dr. Birdie Sons and reviewed their note for today.  I agree with their assessment and plan.

## 2012-06-29 NOTE — Progress Notes (Signed)
FMTS interval progress note  S: patient doing well this morning with no further TIA symptoms. Notes that back in the '60's he had severe migraines and on one occurrence noted that he had difficulty speaking with this.  O: BP 147/77  Pulse 78  Temp(Src) 97.9 F (36.6 C) (Oral)  Resp 16  Ht 5\' 10"  (1.778 m)  Wt 209 lb 3.5 oz (94.9 kg)  BMI 30.02 kg/m2  SpO2 95% Pe: Gen: NAD sitting on bed CV: rrr, no mrg appreciated Pulm: CTAB, no wheezes or crackles Neuro: CN 2-12 intact, 5/5 strength throughout, sensation to light touch in tact, 2+ patellar reflex, gait normal  Results for orders placed during the hospital encounter of 06/29/12 (from the past 24 hour(s))  PROTIME-INR     Status: None   Collection Time    06/29/12 12:28 AM      Result Value Range   Prothrombin Time 12.9  11.6 - 15.2 seconds   INR 0.98  0.00 - 1.49  APTT     Status: None   Collection Time    06/29/12 12:28 AM      Result Value Range   aPTT 30  24 - 37 seconds  CBC     Status: None   Collection Time    06/29/12 12:28 AM      Result Value Range   WBC 8.5  4.0 - 10.5 K/uL   RBC 4.71  4.22 - 5.81 MIL/uL   Hemoglobin 14.3  13.0 - 17.0 g/dL   HCT 62.1  30.8 - 65.7 %   MCV 89.2  78.0 - 100.0 fL   MCH 30.4  26.0 - 34.0 pg   MCHC 34.0  30.0 - 36.0 g/dL   RDW 84.6  96.2 - 95.2 %   Platelets 293  150 - 400 K/uL  DIFFERENTIAL     Status: None   Collection Time    06/29/12 12:28 AM      Result Value Range   Neutrophils Relative 71  43 - 77 %   Neutro Abs 6.0  1.7 - 7.7 K/uL   Lymphocytes Relative 20  12 - 46 %   Lymphs Abs 1.7  0.7 - 4.0 K/uL   Monocytes Relative 7  3 - 12 %   Monocytes Absolute 0.6  0.1 - 1.0 K/uL   Eosinophils Relative 2  0 - 5 %   Eosinophils Absolute 0.2  0.0 - 0.7 K/uL   Basophils Relative 0  0 - 1 %   Basophils Absolute 0.0  0.0 - 0.1 K/uL  COMPREHENSIVE METABOLIC PANEL     Status: Abnormal   Collection Time    06/29/12 12:28 AM      Result Value Range   Sodium 138  135 - 145  mEq/L   Potassium 4.1  3.5 - 5.1 mEq/L   Chloride 100  96 - 112 mEq/L   CO2 30  19 - 32 mEq/L   Glucose, Bld 135 (*) 70 - 99 mg/dL   BUN 24 (*) 6 - 23 mg/dL   Creatinine, Ser 8.41  0.50 - 1.35 mg/dL   Calcium 9.6  8.4 - 32.4 mg/dL   Total Protein 7.7  6.0 - 8.3 g/dL   Albumin 3.6  3.5 - 5.2 g/dL   AST 22  0 - 37 U/L   ALT 14  0 - 53 U/L   Alkaline Phosphatase 104  39 - 117 U/L   Total Bilirubin 0.4  0.3 - 1.2 mg/dL  GFR calc non Af Amer 82 (*) >90 mL/min   GFR calc Af Amer >90  >90 mL/min  POCT I-STAT TROPONIN I     Status: None   Collection Time    06/29/12  1:04 AM      Result Value Range   Troponin i, poc 0.00  0.00 - 0.08 ng/mL   Comment 3           LIPID PANEL     Status: Abnormal   Collection Time    06/29/12  5:22 AM      Result Value Range   Cholesterol 187  0 - 200 mg/dL   Triglycerides 82  <454 mg/dL   HDL 34 (*) >09 mg/dL   Total CHOL/HDL Ratio 5.5     VLDL 16  0 - 40 mg/dL   LDL Cholesterol 811 (*) 0 - 99 mg/dL  TROPONIN I     Status: None   Collection Time    06/29/12  5:22 AM      Result Value Range   Troponin I <0.30  <0.30 ng/mL  GLUCOSE, CAPILLARY     Status: Abnormal   Collection Time    06/29/12  8:06 AM      Result Value Range   Glucose-Capillary 144 (*) 70 - 99 mg/dL   Comment 1 Documented in Chart     Comment 2 Notify RN     A/P: Steven Simmons is a 72 y.o. year old male with history of HTN presenting with aphasia and dysarthria.   1. TIA: patient with transient aphasia and dysarthria while talking on the phone. Currently without any symptoms and with no acute disease found on CT head.   -risk stratification labs-A1c -patient with elevated LDL-will start on lipitor  -will obtain MRI and carotid dopplers  -start on ASA 325 tonight, can transition to 81 mg tomorrow  -will allow permissive HTN in setting of TIA  -neuro checks q4 hours    2. Palpitations: patient experienced these during his episode. NSR on EKG. Resolved prior to presentation  to ED.  -will monitor on telemetry for any events while here  -NSR on tele -will likely need event monitor as outpatient to see if there are any arrhythmias   3. HTN: will restart home anti-HTN medication  FEN/GI: carb modified  Prophylaxis: SQ heparin  Disposition: admit to tele, discharge pending completion of TIA work-up  Code Status: full  Marikay Alar, MD PGY1

## 2012-06-29 NOTE — Progress Notes (Signed)
  Echocardiogram 2D Echocardiogram has been performed.  Steven Simmons 06/29/2012, 12:12 PM

## 2012-06-29 NOTE — ED Provider Notes (Signed)
History     CSN: [id number]  Arrival date & time 06/29/12  0016   First MD Initiated Contact with Patient 06/29/12 0020      Chief Complaint  Patient presents with  . Aphasia    (Consider location/radiation/quality/duration/timing/severity/associated sxs/prior treatment) HPI History provided by patient. Has history of hypertension. At home tonight talking to a friend on the telephone and had sudden onset of garbled speech and difficulty finding words. The symptoms lasted about 15-20 minutes and resolved prior to getting to the emergency department. No associated weakness or numbness in his face, arms or legs. At the time he felt a little "unsteady" on his feet but no unilateral symptoms. No chest pain or shortness of breath. No history of same. No headache. At this time has no symptoms. This occurred about an hour prior to arrival.  Past Medical History  Diagnosis Date  . Peptic ulcer disease   . Hypertension   . GERD (gastroesophageal reflux disease)     Past Surgical History  Procedure Laterality Date  . Cholecystectomy      Family History  Problem Relation Age of Onset  . Heart disease Mother   . Hypertension Mother   . Heart disease Father   . Hypertension Father     History  Substance Use Topics  . Smoking status: Former Games developer  . Smokeless tobacco: Not on file  . Alcohol Use: No      Review of Systems  Constitutional: Negative for fever and chills.  HENT: Negative for neck pain and neck stiffness.   Eyes: Negative for pain.  Respiratory: Negative for shortness of breath.   Cardiovascular: Negative for chest pain.  Gastrointestinal: Negative for abdominal pain.  Genitourinary: Negative for dysuria.  Musculoskeletal: Negative for back pain.  Skin: Negative for rash.  Neurological: Positive for speech difficulty. Negative for seizures and headaches.  All other systems reviewed and are negative.    Allergies  Ceclor; Veralipride; Antihistamines,  chlorpheniramine-type; Beta adrenergic blockers; Celebrex; Ibuprofen; Nutrasweet aspartame; and Penicillins  Home Medications   Current Outpatient Rx  Name  Route  Sig  Dispense  Refill  . aspirin 81 MG chewable tablet   Oral   Chew 81 mg by mouth as needed (occasional use for headache).         . lisinopril-hydrochlorothiazide (PRINZIDE,ZESTORETIC) 10-12.5 MG per tablet   Oral   Take 1 tablet by mouth daily.   90 tablet   3     BP 171/74  Pulse 86  Temp(Src) 97.5 F (36.4 C) (Oral)  Resp 12  SpO2 97%  Physical Exam  Constitutional: He is oriented to person, place, and time. He appears well-developed and well-nourished.  HENT:  Head: Normocephalic and atraumatic.  Eyes: Conjunctivae and EOM are normal. Pupils are equal, round, and reactive to light.  Neck: Full passive range of motion without pain. Neck supple. No thyromegaly present.  No meningismus  Cardiovascular: Normal rate, regular rhythm, S1 normal, S2 normal and intact distal pulses.   Pulmonary/Chest: Effort normal and breath sounds normal.  Abdominal: Soft. Bowel sounds are normal. There is no tenderness. There is no CVA tenderness.  Musculoskeletal: Normal range of motion.  Neurological: He is alert and oriented to person, place, and time. He has normal strength and normal reflexes. No cranial nerve deficit or sensory deficit. He displays a negative Romberg sign. GCS eye subscore is 4. GCS verbal subscore is 5. GCS motor subscore is 6.  Normal Gait  Skin: Skin is warm and  dry. No rash noted. No cyanosis. Nails show no clubbing.  Psychiatric: He has a normal mood and affect. His speech is normal and behavior is normal.    ED Course  Procedures (including critical care time)  Labs Reviewed  COMPREHENSIVE METABOLIC PANEL - Abnormal; Notable for the following:    Glucose, Bld 135 (*)    BUN 24 (*)    GFR calc non Af Amer 82 (*)    All other components within normal limits  PROTIME-INR  APTT  CBC   DIFFERENTIAL  TROPONIN I  POCT I-STAT TROPONIN I   Ct Head Wo Contrast  06/29/2012  *RADIOLOGY REPORT*  Clinical Data: Aphasia  CT HEAD WITHOUT CONTRAST  Technique:  Contiguous axial images were obtained from the base of the skull through the vertex without contrast.  Comparison: None.  Findings: The ventricles are normal in size and in the midline without mass effect or shift.  No extra-axial fluid collections are identified.  Moderate periventricular white matter disease is noted.  No findings for acute hemispheric infarction and/or intracranial hemorrhage.  No mass lesions.  The brainstem and cerebellum grossly normal.  Vascular calcifications are noted.  The bony structures appear normal.  The paranasal sinuses and mastoid air cells are clear.  IMPRESSION: No acute intracranial findings or mass lesions. Age related cerebral atrophy and periventricular white matter disease.   Original Report Authenticated By: Rudie Meyer, M.D.      1. TIA (transient ischemic attack)     Date: 06/29/2012  Rate: 92  Rhythm: normal sinus rhythm  QRS Axis: normal  Intervals: normal  ST/T Wave abnormalities: nonspecific ST changes  Conduction Disutrbances:none  Narrative Interpretation:   Old EKG Reviewed: none available  CT scan obtained and reviewed an aspirin provided. Serial neuro exams.  Medicine consult. Discussed with family practice resident service on call. When admit TIA workup  MDM  15-20 minute episode of speech deficits, as all prior to arrival  CT brain. EKG. Labs.  Aspirin.  Remains asymptomatic in the emergency department. Medicine admit        Sunnie Nielsen, MD 06/29/12 [phone]

## 2012-06-29 NOTE — ED Notes (Signed)
Pt states he has episode of "not being able to get my words out" around 11:15 pm tonight.  Able to speak in full sentences now, no facial droop, grip strength equal, no arm drift.

## 2012-06-29 NOTE — Progress Notes (Signed)
Patient refused MRI exam due to claustrophobia. Did not want to try even with medication. Tobi Bastos RN notified that patient refused exam.

## 2012-06-29 NOTE — Progress Notes (Signed)
Patient discharge education completed and IV removed.  Patient prescriptions given.  Lance Bosch, RN

## 2012-06-29 NOTE — H&P (Signed)
Seen and examined.  Discussed with Dr. Armen Pickup.  Agree with admit and her management and documentation.  Briefly, 72 yo male with hx of hypertension admitted with a convincing story of a TIA affecting speech with both dysarthria and aphasia.  Symptoms resolved quickly.  Has had suboptimal control of hypertension.  In the past, was recommended to take daily ASA but has not done so recently.  Not on statin.  Did have brief palpitations during event but also admits to being quite nervous. Imp: TIA, likely ischemic rather than embolic.  For secondary prevention now needs excellent BP control, daily ASA and statin (high LDL, low HDL).  We can do event monitor as outpatient - I view cardiac source highly unlikely with normal exam and only brief, one time palpitations.  OK to DC today with close FU.

## 2012-07-06 ENCOUNTER — Encounter: Payer: Self-pay | Admitting: Family Medicine

## 2012-07-06 ENCOUNTER — Ambulatory Visit (INDEPENDENT_AMBULATORY_CARE_PROVIDER_SITE_OTHER): Payer: Medicare Other | Admitting: Family Medicine

## 2012-07-06 VITALS — BP 124/86 | HR 85 | Temp 98.5°F | Ht 70.0 in | Wt 212.0 lb

## 2012-07-06 DIAGNOSIS — G459 Transient cerebral ischemic attack, unspecified: Secondary | ICD-10-CM

## 2012-07-06 MED ORDER — LOVASTATIN 40 MG PO TABS: 40.0000 mg | ORAL_TABLET | Freq: Every day | ORAL | Status: DC

## 2012-07-06 NOTE — Assessment & Plan Note (Addendum)
Doing well. No current symptoms. Has not had additional changed language ability. Patient with question of vegetation on TTE. Plan: patient to continue ASA 81 mg daily, states will not use plavix given upset stomach (during admission thought patient had failed ASA, though today patient states had only been using intermittently for aches and pains). Patient to try lovastatin in place of lipitor given patient preference due to side effects experienced on lipitor. To start healthy diet. Will refer to cardiology for evaluation for TEE given TTE results.

## 2012-07-06 NOTE — Progress Notes (Signed)
  Subjective:    Patient ID: Steven Simmons, male    DOB: 1940-06-13, 72 y.o.   MRN: [id number]  HPI Patient is a 72 male who presents for hospital f/u for TIA.  Patient states has been doing well. No more symptoms of garbled language. Tried taking lipitor but had headache, increased BP, and a little shortness of breath following taking this, so decided to not take it anymore. Additionally stopped taking plavix because it upset his stomach. Upon further questioning appears patient had only been intermittently taking aspirin for aches and pains, so not a true failure of aspirin therapy. States going to start new diet with more veggies and chicken/turkey. Denies chest pain or shortness of breath.  TTE in hospital with Mitral valve: Thickened and calcified anterior mitralvalve leaflet, cannot totally exclude a vegetation. If TIA suspected, suggest TEE to further characterize.  Review of Systems see HPI     Objective:   Physical Exam  Constitutional: He appears well-developed and well-nourished.  HENT:  Head: Normocephalic and atraumatic.  Cardiovascular: Normal rate, regular rhythm and normal heart sounds.  Exam reveals no gallop and no friction rub.   No murmur heard. Pulmonary/Chest: Effort normal and breath sounds normal.  Neurological: He is alert.  5/5 strength throughout, CN 2-12 intact, sensation to light touch intact  Skin: Skin is warm and dry.  BP 124/86  Pulse 85  Temp(Src) 98.5 F (36.9 C) (Oral)  Ht 5\' 10"  (1.778 m)  Wt 212 lb (96.163 kg)  BMI 30.42 kg/m2    Assessment & Plan:

## 2012-07-06 NOTE — Patient Instructions (Addendum)
Nice to see you again. I have given you a prescription for lovastatin. Please try this in place of lipitor. If this doesn't work we will try another medication. Please continue to take aspirin 81 mg daily. We will be setting you up with a cardiology referral to determine if you need further evaluation for the potential vegetation on your mitral valve.

## 2012-07-25 ENCOUNTER — Institutional Professional Consult (permissible substitution): Payer: Medicare Other | Admitting: Cardiology

## 2012-08-11 ENCOUNTER — Encounter: Payer: Self-pay | Admitting: Family Medicine

## 2012-08-11 ENCOUNTER — Ambulatory Visit (INDEPENDENT_AMBULATORY_CARE_PROVIDER_SITE_OTHER): Payer: Medicare Other | Admitting: Family Medicine

## 2012-08-11 VITALS — BP 140/88 | HR 92 | Temp 98.2°F | Ht 70.0 in | Wt 209.0 lb

## 2012-08-11 DIAGNOSIS — G459 Transient cerebral ischemic attack, unspecified: Secondary | ICD-10-CM

## 2012-08-11 DIAGNOSIS — E785 Hyperlipidemia, unspecified: Secondary | ICD-10-CM | POA: Insufficient documentation

## 2012-08-11 DIAGNOSIS — I10 Essential (primary) hypertension: Secondary | ICD-10-CM

## 2012-08-11 MED ORDER — LISINOPRIL-HYDROCHLOROTHIAZIDE 10-12.5 MG PO TABS: 1.0000 | ORAL_TABLET | Freq: Every day | ORAL | Status: DC

## 2012-08-11 MED ORDER — LOVASTATIN 20 MG PO TABS: 40.0000 mg | ORAL_TABLET | Freq: Every day | ORAL | Status: DC

## 2012-08-11 NOTE — Progress Notes (Signed)
  Subjective:    Patient ID: Steven Simmons, male    DOB: 29-Jan-1941, 72 y.o.   MRN: [id number]  HPI Patient is a 72 male who presents for f/u of HTN and HLD in setting of post-TIA.  HTN: patient taking lisinopril-HCTZ 10-12.5 daily. Denies chest pain, shortness of breath, headache. Has been walking daily and eating healthier. Was previously referred for cardiology eval given previous TTE read stating that they could not exclude vegetation on the mitral valve. Patient was referred to [facility] and states he does not feel he needs the referral because he feels fine right now.  HLD: states needs Rx for lovastatin 20 mg tabs b/c they are $4. Denies muscle aches.  Review of Systems see HPI     Objective:   Physical Exam  Constitutional: He appears well-developed and well-nourished.  HENT:  Head: Normocephalic and atraumatic.  Cardiovascular: Normal rate, regular rhythm, normal heart sounds and intact distal pulses.  Exam reveals no gallop and no friction rub.   No murmur heard. Pulmonary/Chest: Effort normal and breath sounds normal. No respiratory distress. He has no wheezes. He has no rales.  Neurological: He is alert.  Skin: Skin is warm and dry.  BP 140/88  Pulse 92  Temp(Src) 98.2 F (36.8 C) (Oral)  Ht 5\' 10"  (1.778 m)  Wt 209 lb (94.802 kg)  BMI 29.99 kg/m2    Assessment & Plan:

## 2012-08-11 NOTE — Patient Instructions (Signed)
Nice to see you again. You are doing a fantastic job. Continue to exercise and eat healthy. I have sent a prescription for your lovastatin to [facility]. If you develop any chest pain, difficulty breathing, weakness, blurry vision, changed in speech, or changes in sensation please seek medical attention.

## 2012-08-11 NOTE — Assessment & Plan Note (Signed)
At goal. Plan: continue lisinopril-HCTZ 10-12.5. Will need to evaluate renal function at next visit.

## 2012-08-11 NOTE — Assessment & Plan Note (Signed)
Started on statin in post stroke setting. LDL 137. Plan: continue lovastatin 40 mg daily.

## 2012-08-11 NOTE — Assessment & Plan Note (Signed)
Patient prefers no cardiology at this time due to feeling well. Discussed that we would prefer that he have this referral due to uncertainty of vegetation seen on TTE. Patient stated he would prefer not to do this and stated he knows what to look for to bring himself back in. Will continue to discuss this with the patient. To continue ASA 81 mg.

## 2012-08-13 ENCOUNTER — Emergency Department (HOSPITAL_COMMUNITY)
Admission: EM | Admit: 2012-08-13 | Discharge: 2012-08-13 | Disposition: A | Discharge status: Home / Self Care | Payer: Medicare Other | Attending: Emergency Medicine | Admitting: Emergency Medicine

## 2012-08-13 ENCOUNTER — Other Ambulatory Visit: Payer: Self-pay

## 2012-08-13 ENCOUNTER — Encounter (HOSPITAL_COMMUNITY): Payer: Self-pay | Admitting: Emergency Medicine

## 2012-08-13 DIAGNOSIS — Z860100 Personal history of colon polyps, unspecified: Secondary | ICD-10-CM | POA: Insufficient documentation

## 2012-08-13 DIAGNOSIS — Z7982 Long term (current) use of aspirin: Secondary | ICD-10-CM | POA: Insufficient documentation

## 2012-08-13 DIAGNOSIS — Z79899 Other long term (current) drug therapy: Secondary | ICD-10-CM | POA: Insufficient documentation

## 2012-08-13 DIAGNOSIS — R42 Dizziness and giddiness: Secondary | ICD-10-CM | POA: Insufficient documentation

## 2012-08-13 DIAGNOSIS — Z8719 Personal history of other diseases of the digestive system: Secondary | ICD-10-CM | POA: Insufficient documentation

## 2012-08-13 DIAGNOSIS — F411 Generalized anxiety disorder: Secondary | ICD-10-CM | POA: Insufficient documentation

## 2012-08-13 DIAGNOSIS — I10 Essential (primary) hypertension: Secondary | ICD-10-CM | POA: Insufficient documentation

## 2012-08-13 DIAGNOSIS — Z880 Allergy status to penicillin: Secondary | ICD-10-CM | POA: Insufficient documentation

## 2012-08-13 DIAGNOSIS — Z87891 Personal history of nicotine dependence: Secondary | ICD-10-CM | POA: Insufficient documentation

## 2012-08-13 LAB — COMPREHENSIVE METABOLIC PANEL
ALT: 14 U/L (ref 0–53)
AST: 21 U/L (ref 0–37)
Albumin: 3.7 g/dL (ref 3.5–5.2)
Alkaline Phosphatase: 106 U/L (ref 39–117)
BUN: 14 mg/dL (ref 6–23)
Chloride: 98 mEq/L (ref 96–112)
Potassium: 4 mEq/L (ref 3.5–5.1)
Sodium: 137 mEq/L (ref 135–145)
Total Bilirubin: 0.6 mg/dL (ref 0.3–1.2)
Total Protein: 7.8 g/dL (ref 6.0–8.3)

## 2012-08-13 LAB — CBC WITH DIFFERENTIAL/PLATELET
Basophils Absolute: 0 10*3/uL (ref 0.0–0.1)
Basophils Relative: 0 % (ref 0–1)
Eosinophils Absolute: 0 10*3/uL (ref 0.0–0.7)
Hemoglobin: 15 g/dL (ref 13.0–17.0)
MCH: 30.1 pg (ref 26.0–34.0)
MCHC: 34.4 g/dL (ref 30.0–36.0)
Monocytes Relative: 6 % (ref 3–12)
Neutro Abs: 8.3 10*3/uL — ABNORMAL HIGH (ref 1.7–7.7)
Neutrophils Relative %: 84 % — ABNORMAL HIGH (ref 43–77)
Platelets: 341 10*3/uL (ref 150–400)

## 2012-08-13 LAB — URINALYSIS, ROUTINE W REFLEX MICROSCOPIC
Bilirubin Urine: NEGATIVE
Glucose, UA: NEGATIVE mg/dL
Ketones, ur: NEGATIVE mg/dL
Nitrite: NEGATIVE
Specific Gravity, Urine: 1.011 (ref 1.005–1.030)
pH: 6.5 (ref 5.0–8.0)

## 2012-08-13 LAB — TROPONIN I: Troponin I: <0.3 ng/mL (ref <0.30)

## 2012-08-13 MED ORDER — CLONIDINE HCL 0.1 MG PO TABS: 0.1000 mg | ORAL_TABLET | Freq: Two times a day (BID) | ORAL | Status: DC | PRN

## 2012-08-13 NOTE — ED Provider Notes (Signed)
History     CSN: [id number]  Arrival date & time 08/13/12  1514   First MD Initiated Contact with Patient 08/13/12 1646      Chief Complaint  Patient presents with  . Hypertension    (Consider location/radiation/quality/duration/timing/severity/associated sxs/prior treatment) HPI Pt states he woke this AM with lightheadedness. Took his blood pressure and it was elevated. States he took 2 of his lisinopril/HCTZ which he has done in the past. Dizziness is improved. No HA, focal weakness, numbness or speech changes. No recent fever, chills, cough, CP, SOB, abd pain, N/V/D, urinary symptoms.  Past Medical History  Diagnosis Date  . Peptic ulcer disease   . Hypertension   . GERD (gastroesophageal reflux disease)     Past Surgical History  Procedure Laterality Date  . Cholecystectomy      Family History  Problem Relation Age of Onset  . Heart disease Mother   . Hypertension Mother   . Heart disease Father   . Hypertension Father     History  Substance Use Topics  . Smoking status: Former Games developer  . Smokeless tobacco: Not on file  . Alcohol Use: No      Review of Systems  Constitutional: Negative for fever, chills and fatigue.  HENT: Negative for neck pain and neck stiffness.   Eyes: Negative for visual disturbance.  Respiratory: Negative for shortness of breath.   Cardiovascular: Negative for chest pain and palpitations.  Gastrointestinal: Negative for nausea, vomiting and abdominal pain.  Genitourinary: Negative for dysuria, frequency and flank pain.  Musculoskeletal: Negative for back pain.  Skin: Negative for rash and wound.  Neurological: Positive for dizziness and light-headedness. Negative for syncope, weakness, numbness and headaches.  All other systems reviewed and are negative.    Allergies  Ceclor; Veralipride; Antihistamines, chlorpheniramine-type; Beta adrenergic blockers; Celebrex; Ibuprofen; Nutrasweet aspartame; and Penicillins  Home Medications    Current Outpatient Rx  Name  Route  Sig  Dispense  Refill  . aspirin 81 MG chewable tablet   Oral   Chew 81 mg by mouth daily as needed (occasional use for headache).          . lisinopril-hydrochlorothiazide (PRINZIDE,ZESTORETIC) 10-12.5 MG per tablet   Oral   Take 1 tablet by mouth daily.   90 tablet   3   . OVER THE COUNTER MEDICATION   Oral   Take 1 tablet by mouth daily. Potassium         . cloNIDine (CATAPRES) 0.1 MG tablet   Oral   Take 1 tablet (0.1 mg total) by mouth 2 (two) times daily as needed (systoic BP> 200).   15 tablet   0     BP 152/96  Pulse 85  Temp(Src) 98 F (36.7 C) (Oral)  Resp 17  SpO2 94%  Physical Exam  Nursing note and vitals reviewed. Constitutional: He is oriented to person, place, and time. He appears well-developed and well-nourished. No distress.  HENT:  Head: Normocephalic and atraumatic.  Mouth/Throat: Oropharynx is clear and moist.  Eyes: EOM are normal. Pupils are equal, round, and reactive to light.  Neck: Normal range of motion. Neck supple.  Cardiovascular: Normal rate and regular rhythm.   Pulmonary/Chest: Effort normal and breath sounds normal. No respiratory distress. He has no wheezes. He has no rales.  Abdominal: Soft. Bowel sounds are normal. He exhibits no distension and no mass. There is no tenderness. There is no rebound and no guarding.  Musculoskeletal: Normal range of motion. He exhibits no edema  and no tenderness.  Neurological: He is alert and oriented to person, place, and time.  5/5 motor in all ext, sensation intact.   Skin: Skin is warm and dry. No rash noted. No erythema.  Psychiatric:  mildly anxious    ED Course  Procedures (including critical care time)  Labs Reviewed  CBC WITH DIFFERENTIAL - Abnormal; Notable for the following:    Neutrophils Relative 84 (*)    Neutro Abs 8.3 (*)    Lymphocytes Relative 11 (*)    All other components within normal limits  COMPREHENSIVE METABOLIC PANEL -  Abnormal; Notable for the following:    Glucose, Bld 112 (*)    GFR calc non Af Amer 89 (*)    All other components within normal limits  TROPONIN I  URINALYSIS, ROUTINE W REFLEX MICROSCOPIC   No results found.   1. Uncontrolled hypertension       MDM  Advised to f/u with PMD for BP management. Return precautions given        Loren Racer, MD 08/13/12 2240

## 2012-08-13 NOTE — ED Notes (Signed)
Pt states he was dizzy this am, checked BP and it was elevated. Pt took his BP medication and it went down, however, 2 hrs later he took his BP again and it was elevated. Pt denies any other symptoms at this time. NAD noted. No neuro deficits.

## 2012-08-13 NOTE — ED Notes (Signed)
Dr Yelverton at bedside.  

## 2012-08-13 NOTE — ED Notes (Signed)
Pt states has been feeling dizzy this morning which he knows means he has to check his blood pressure which was 200/98. Pt took blood pressure medicine today to try and decrease pressure and medication was ineffective.

## 2012-08-15 ENCOUNTER — Ambulatory Visit: Payer: Medicare Other | Admitting: Family Medicine

## 2012-09-07 ENCOUNTER — Ambulatory Visit: Payer: Medicare Other | Admitting: Family Medicine

## 2012-09-22 ENCOUNTER — Ambulatory Visit (INDEPENDENT_AMBULATORY_CARE_PROVIDER_SITE_OTHER): Payer: Medicare Other | Admitting: Family Medicine

## 2012-09-22 ENCOUNTER — Encounter: Payer: Self-pay | Admitting: Family Medicine

## 2012-09-22 VITALS — BP 140/78 | HR 87 | Temp 98.3°F | Wt 208.0 lb

## 2012-09-22 DIAGNOSIS — E785 Hyperlipidemia, unspecified: Secondary | ICD-10-CM

## 2012-09-22 DIAGNOSIS — I10 Essential (primary) hypertension: Secondary | ICD-10-CM

## 2012-09-22 NOTE — Patient Instructions (Signed)
Nice to see you today. You are doing great. Continue to work on weight loss. Please start the statin we discussed at your last visit. Please check your blood pressure at home and record the results. Bring these with you at your next visit.

## 2012-09-23 ENCOUNTER — Other Ambulatory Visit: Payer: Self-pay | Admitting: Family Medicine

## 2012-09-23 NOTE — Assessment & Plan Note (Signed)
Discussed importance of statin with patient. Even though his LDL is not terribly high, discussed that it was still important for him to take this since he had a TIA and statins have proven beneficial in this setting. Continued to state that he would take this once he has time.

## 2012-09-23 NOTE — Progress Notes (Signed)
  Subjective:    Patient ID: Rodric Punch, male    DOB: [birth year], 72 y.o.   MRN: [id number]  HPI Patient is a 72 yo male who presents for f/u of his HTN and HLD.  HTN: Is taking lisinopril/HCTZ daily. Is intermittently checking his BP at home. States ranges anywhere from 130's-160's/80's-90's. Denies CP, SOB, and edema.  HLD: last LDL 137. Patient with TIA several months ago so supposed to start on statin. Is not currently taking his statin b/c he found a journal article that stated there was a slowing of mental capacity associated with this statin in 1993. Patient states he is waiting until he has nothing to do to try the statin so if this slowing occurs it won't affect him too much  Also had question regarding left shoulder pain he had 2 years ago. Sharp located in the joint when he abducted his left arm. Gradually improved and no pain now. Likely rotator cuff impingement.  Review of Systems see HPI     Objective:   Physical Exam  Constitutional: He appears well-developed and well-nourished.  HENT:  Head: Normocephalic and atraumatic.  Cardiovascular: Normal rate, regular rhythm and normal heart sounds.  Exam reveals no gallop and no friction rub.   No murmur heard. Pulmonary/Chest: Effort normal and breath sounds normal. He has no wheezes. He has no rales.  Abdominal: Soft. He exhibits no distension. There is no tenderness.  Musculoskeletal: He exhibits no edema.  Skin: Skin is warm and dry.  BP 140/78  Pulse 87  Temp(Src) 98.3 F (36.8 C) (Oral)  Wt 208 lb (94.348 kg)  BMI 29.84 kg/m2     Assessment & Plan:

## 2012-09-23 NOTE — Assessment & Plan Note (Signed)
BP at goal. Continue current dosing of lisiniporil/HCTZ

## 2012-10-24 ENCOUNTER — Encounter: Payer: Self-pay | Admitting: Family Medicine

## 2012-10-24 ENCOUNTER — Ambulatory Visit (INDEPENDENT_AMBULATORY_CARE_PROVIDER_SITE_OTHER): Payer: Medicare Other | Admitting: Family Medicine

## 2012-10-24 VITALS — BP 166/76 | HR 82 | Temp 98.6°F | Ht 70.0 in | Wt 205.0 lb

## 2012-10-24 DIAGNOSIS — I10 Essential (primary) hypertension: Secondary | ICD-10-CM

## 2012-10-24 DIAGNOSIS — E785 Hyperlipidemia, unspecified: Secondary | ICD-10-CM

## 2012-10-24 NOTE — Patient Instructions (Signed)
Nice to see you again. Please continue to take your blood pressure medications. Given that you had a TIA we recommend that you take a statin. I previously prescribed this to you. Once you feel comfortable taking this please try it.

## 2012-10-24 NOTE — Assessment & Plan Note (Signed)
BP elevated in office today, but recorded values are below goal. Will continue current regimen. See back in 3 months.

## 2012-10-24 NOTE — Assessment & Plan Note (Signed)
Discussed importance of statin with patient once again. Still not taking this and continues to reference study of 70 yo's that had slowed response rates while driving while on statins and states won't take this medication until he has time when he doesn't have to do anything.

## 2012-10-24 NOTE — Progress Notes (Signed)
  Subjective:    Patient ID: Steven Simmons, male    DOB: 09-24-1940, 72 y.o.   MRN: [id number]  Hypertension   HYPERTENSION Disease Monitoring Home BP Monitoring checking at home Range 121-150/64-76 Chest pain- no    Dyspnea- no Medications: lisinopril/hctz Compliance-  Taking daily  HYPERLIPIDEMIA Medications: Compliance- not taking previously prescribed statin, see assessment for reason    Review of Systems see HPI     Objective:   Physical Exam  Constitutional: He appears well-developed and well-nourished.  HENT:  Head: Normocephalic and atraumatic.  Cardiovascular: Normal rate, regular rhythm and normal heart sounds.  Exam reveals no gallop and no friction rub.   No murmur heard. Pulmonary/Chest: Effort normal and breath sounds normal. No respiratory distress. He has no wheezes.  Skin: Skin is warm and dry.  BP 166/76  Pulse 82  Temp(Src) 98.6 F (37 C) (Oral)  Ht 5\' 10"  (1.778 m)  Wt 205 lb (92.987 kg)  BMI 29.41 kg/m2    Assessment & Plan:

## 2012-11-18 ENCOUNTER — Encounter (HOSPITAL_COMMUNITY): Payer: Self-pay | Admitting: *Deleted

## 2012-11-18 ENCOUNTER — Emergency Department (HOSPITAL_COMMUNITY)
Admission: EM | Admit: 2012-11-18 | Discharge: 2012-11-18 | Disposition: A | Discharge status: Home / Self Care | Payer: Medicare Other | Attending: Emergency Medicine | Admitting: Emergency Medicine

## 2012-11-18 DIAGNOSIS — R209 Unspecified disturbances of skin sensation: Secondary | ICD-10-CM | POA: Insufficient documentation

## 2012-11-18 DIAGNOSIS — I10 Essential (primary) hypertension: Secondary | ICD-10-CM

## 2012-11-18 DIAGNOSIS — Z8711 Personal history of peptic ulcer disease: Secondary | ICD-10-CM | POA: Insufficient documentation

## 2012-11-18 DIAGNOSIS — Z87891 Personal history of nicotine dependence: Secondary | ICD-10-CM | POA: Insufficient documentation

## 2012-11-18 DIAGNOSIS — Z8719 Personal history of other diseases of the digestive system: Secondary | ICD-10-CM | POA: Insufficient documentation

## 2012-11-18 DIAGNOSIS — Z880 Allergy status to penicillin: Secondary | ICD-10-CM | POA: Insufficient documentation

## 2012-11-18 DIAGNOSIS — Z79899 Other long term (current) drug therapy: Secondary | ICD-10-CM | POA: Insufficient documentation

## 2012-11-18 NOTE — ED Provider Notes (Addendum)
CSN: [id number]     Arrival date & time 11/18/12  1548 History     First MD Initiated Contact with Patient 11/18/12 1620     Chief Complaint  Patient presents with  . Hypertension   (Consider location/radiation/quality/duration/timing/severity/associated sxs/prior Treatment) HPI Comments: Pt reports no SOB, CP, back pain, HA, blurred vision, dizziness, syncope.  He has a h/o TIA and for past 3 days, average BP's have been higher despite taking his usual medication as prescribed.  He also will take an extra tablet in the AM on occasion.  He also has been given clonidine in the ED in the past written to be taken as needed.  So he took that earlier as well and feels more relaxed, but it was still mildly up and so became concerned and came to the ED.  He notes he did fall and hurthis tailbone about a week ago, has a small cut to his right heel and hurt his left foot as well.  No problems walking.  He gets occasional numb sensation to bottom of feet, improved with walking on them.  He has not spoken to his PMD recently about these symptoms.    Patient is a 72 y.o. male presenting with hypertension. The history is provided by the patient.  Hypertension This is a chronic problem. Pertinent negatives include no chest pain and no shortness of breath.    Past Medical History  Diagnosis Date  . Peptic ulcer disease   . Hypertension   . GERD (gastroesophageal reflux disease)    Past Surgical History  Procedure Laterality Date  . Cholecystectomy     Family History  Problem Relation Age of Onset  . Heart disease Mother   . Hypertension Mother   . Heart disease Father   . Hypertension Father    History  Substance Use Topics  . Smoking status: Former Games developer  . Smokeless tobacco: Not on file  . Alcohol Use: No    Review of Systems  Respiratory: Negative for shortness of breath.   Cardiovascular: Negative for chest pain.  Gastrointestinal: Negative for nausea.  Skin: Positive for wound.   Neurological: Positive for numbness.  All other systems reviewed and are negative.    Allergies  Ceclor; Veralipride; Antihistamines, chlorpheniramine-type; Beta adrenergic blockers; Celebrex; Ibuprofen; Nutrasweet aspartame; and Penicillins  Home Medications   Current Outpatient Rx  Name  Route  Sig  Dispense  Refill  . aspirin 81 MG chewable tablet   Oral   Chew 81 mg by mouth daily as needed (occasional use for headache).          . cloNIDine (CATAPRES) 0.1 MG tablet   Oral   Take 0.1 mg by mouth 2 (two) times daily as needed (high blood pressure).         Marland Kitchen lisinopril-hydrochlorothiazide (PRINZIDE,ZESTORETIC) 10-12.5 MG per tablet   Oral   Take 1 tablet by mouth daily.   90 tablet   3    BP 169/98  Pulse 100  Temp(Src) 98.1 F (36.7 C) (Oral)  Resp 18  SpO2 95% Physical Exam  Nursing note and vitals reviewed. Constitutional: He is oriented to person, place, and time. He appears well-developed and well-nourished. No distress.  HENT:  Head: Normocephalic and atraumatic.  Eyes: Conjunctivae and EOM are normal. No scleral icterus.  Neck: Normal range of motion. Neck supple.  Cardiovascular: Normal rate, regular rhythm and intact distal pulses.   Pulmonary/Chest: Effort normal. No respiratory distress. He has no wheezes.  Abdominal: Soft.  He exhibits no distension. There is no tenderness. There is no rebound and no guarding.  Musculoskeletal:  Good DP pulses B, small superficial laceration through to dermis only on heel of right foot, no pus drainage, no significant erythema, warmth or tenderness  Neurological: He is alert and oriented to person, place, and time. He has normal strength. No sensory deficit. Gait normal.  No arm drift, normal finger to nose  Skin: Skin is warm and dry. He is not diaphoretic.  Psychiatric: He has a normal mood and affect.    ED Course   Procedures (including critical care time)  Labs Reviewed - No data to display No results  found. 1. Hypertension     MDM  RA sat is 95% and I interpret to be adequate  Pt has no symptoms or signs of end organ failure, BP is mildly up with h/o chronic HTN.  I advised that he not taken clonidine as a prn medication.  I encouraged him to keep a log for 1 week 2-3 times per day only and to discuss BP with PMD.  He can return for stroke like symptoms, CP, SOB, dizziness, syncope, visual loss or significant change.  Pt is agreeable to plan.    Gavin Pound. Oletta Lamas, MD 11/18/12 1653  Gavin Pound. Mairyn Lenahan, MD 11/18/12 1610

## 2012-11-18 NOTE — ED Notes (Signed)
Pt reports having htn and taking meds as prescribed but bp has been running high x 2 days, too high for his meter to read at home. Then took clonidine at 1530. bp 169/98 at triage, reports nausea and just not feeling good. No acute distress noted at triage.

## 2012-11-18 NOTE — Discharge Instructions (Signed)
Arterial Hypertension °Arterial hypertension (high blood pressure) is a condition of elevated pressure in your blood vessels. Hypertension over a long period of time is a risk factor for strokes, heart attacks, and heart failure. It is also the leading cause of kidney (renal) failure.  °CAUSES  °· In Adults -- Over 90% of all hypertension has no known cause. This is called essential or primary hypertension. In the other 10% of people with hypertension, the increase in blood pressure is caused by another disorder. This is called secondary hypertension. Important causes of secondary hypertension are: °· Heavy alcohol use. °· Obstructive sleep apnea. °· Hyperaldosterosim (Conn's syndrome). °· Steroid use. °· Chronic kidney failure. °· Hyperparathyroidism. °· Medications. °· Renal artery stenosis. °· Pheochromocytoma. °· Cushing's disease. °· Coarctation of the aorta. °· Scleroderma renal crisis. °· Licorice (in excessive amounts). °· Drugs (cocaine, methamphetamine). °Your caregiver can explain any items above that apply to you. °· In Children -- Secondary hypertension is more common and should always be considered. °· Pregnancy -- Few women of childbearing age have high blood pressure. However, up to 10% of them develop hypertension of pregnancy. Generally, this will not harm the woman. It may be a sign of 3 complications of pregnancy: preeclampsia, HELLP syndrome, and eclampsia. Follow up and control with medication is necessary. °SYMPTOMS  °· This condition normally does not produce any noticeable symptoms. It is usually found during a routine exam. °· Malignant hypertension is a late problem of high blood pressure. It may have the following symptoms: °· Headaches. °· Blurred vision. °· End-organ damage (this means your kidneys, heart, lungs, and other organs are being damaged). °· Stressful situations can increase the blood pressure. If a person with normal blood pressure has their blood pressure go up while being  seen by their caregiver, this is often termed "white coat hypertension." Its importance is not known. It may be related with eventually developing hypertension or complications of hypertension. °· Hypertension is often confused with mental tension, stress, and anxiety. °DIAGNOSIS  °The diagnosis is made by 3 separate blood pressure measurements. They are taken at least 1 week apart from each other. If there is organ damage from hypertension, the diagnosis may be made without repeat measurements. °Hypertension is usually identified by having blood pressure readings: °· Above 140/90 mmHg measured in both arms, at 3 separate times, over a couple weeks. °· Over 130/80 mmHg should be considered a risk factor and may require treatment in patients with diabetes. °Blood pressure readings over 120/80 mmHg are called "pre-hypertension" even in non-diabetic patients. °To get a true blood pressure measurement, use the following guidelines. Be aware of the factors that can alter blood pressure readings. °· Take measurements at least 1 hour after caffeine. °· Take measurements 30 minutes after smoking and without any stress. This is another reason to quit smoking  it raises your blood pressure. °· Use a proper cuff size. Ask your caregiver if you are not sure about your cuff size. °· Most home blood pressure cuffs are automatic. They will measure systolic and diastolic pressures. The systolic pressure is the pressure reading at the start of sounds. Diastolic pressure is the pressure at which the sounds disappear. If you are elderly, measure pressures in multiple postures. Try sitting, lying or standing. °· Sit at rest for a minimum of 5 minutes before taking measurements. °· You should not be on any medications like decongestants. These are found in many cold medications. °· Record your blood pressure readings and review   them with your caregiver. °If you have hypertension: °· Your caregiver may do tests to be sure you do not have  secondary hypertension (see "causes" above). °· Your caregiver may also look for signs of metabolic syndrome. This is also called Syndrome X or Insulin Resistance Syndrome. You may have this syndrome if you have type 2 diabetes, abdominal obesity, and abnormal blood lipids in addition to hypertension. °· Your caregiver will take your medical and family history and perform a physical exam. °· Diagnostic tests may include blood tests (for glucose, cholesterol, potassium, and kidney function), a urinalysis, or an EKG. Other tests may also be necessary depending on your condition. °PREVENTION  °There are important lifestyle issues that you can adopt to reduce your chance of developing hypertension: °· Maintain a normal weight. °· Limit the amount of salt (sodium) in your diet. °· Exercise often. °· Limit alcohol intake. °· Get enough potassium in your diet. Discuss specific advice with your caregiver. °· Follow a DASH diet (dietary approaches to stop hypertension). This diet is rich in fruits, vegetables, and low-fat dairy products, and avoids certain fats. °PROGNOSIS  °Essential hypertension cannot be cured. Lifestyle changes and medical treatment can lower blood pressure and reduce complications. The prognosis of secondary hypertension depends on the underlying cause. Many people whose hypertension is controlled with medicine or lifestyle changes can live a normal, healthy life.  °RISKS AND COMPLICATIONS  °While high blood pressure alone is not an illness, it often requires treatment due to its short- and long-term effects on many organs. Hypertension increases your risk for: °· CVAs or strokes (cerebrovascular accident). °· Heart failure due to chronically high blood pressure (hypertensive cardiomyopathy). °· Heart attack (myocardial infarction). °· Damage to the retina (hypertensive retinopathy). °· Kidney failure (hypertensive nephropathy). °Your caregiver can explain list items above that apply to you. Treatment  of hypertension can significantly reduce the risk of complications. °TREATMENT  °· For overweight patients, weight loss and regular exercise are recommended. Physical fitness lowers blood pressure. °· Mild hypertension is usually treated with diet and exercise. A diet rich in fruits and vegetables, fat-free dairy products, and foods low in fat and salt (sodium) can help lower blood pressure. Decreasing salt intake decreases blood pressure in a 1/3 of people. °· Stop smoking if you are a smoker. °The steps above are highly effective in reducing blood pressure. While these actions are easy to suggest, they are difficult to achieve. Most patients with moderate or severe hypertension end up requiring medications to bring their blood pressure down to a normal level. There are several classes of medications for treatment. Blood pressure pills (antihypertensives) will lower blood pressure by their different actions. Lowering the blood pressure by 10 mmHg may decrease the risk of complications by as much as 25%. °The goal of treatment is effective blood pressure control. This will reduce your risk for complications. Your caregiver will help you determine the best treatment for you according to your lifestyle. What is excellent treatment for one person, may not be for you. °HOME CARE INSTRUCTIONS  °· Do not smoke. °· Follow the lifestyle changes outlined in the "Prevention" section. °· If you are on medications, follow the directions carefully. Blood pressure medications must be taken as prescribed. Skipping doses reduces their benefit. It also puts you at risk for problems. °· Follow up with your caregiver, as directed. °· If you are asked to monitor your blood pressure at home, follow the guidelines in the "Diagnosis" section above. °SEEK MEDICAL CARE   IF:  °· You think you are having medication side effects. °· You have recurrent headaches or lightheadedness. °· You have swelling in your ankles. °· You have trouble with  your vision. °SEEK IMMEDIATE MEDICAL CARE IF:  °· You have sudden onset of chest pain or pressure, difficulty breathing, or other symptoms of a heart attack. °· You have a severe headache. °· You have symptoms of a stroke (such as sudden weakness, difficulty speaking, difficulty walking). °MAKE SURE YOU:  °· Understand these instructions. °· Will watch your condition. °· Will get help right away if you are not doing well or get worse. °Document Released: 03/22/2005 Document Revised: 06/14/2011 Document Reviewed: 10/20/2006 °[employer]® Patient Information ©2014 [employer]. ° °

## 2013-01-31 ENCOUNTER — Encounter: Payer: Self-pay | Admitting: Family Medicine

## 2013-01-31 ENCOUNTER — Ambulatory Visit (INDEPENDENT_AMBULATORY_CARE_PROVIDER_SITE_OTHER): Payer: Medicare Other | Admitting: Family Medicine

## 2013-01-31 VITALS — BP 163/79 | HR 86 | Temp 98.6°F | Wt 203.0 lb

## 2013-01-31 DIAGNOSIS — E785 Hyperlipidemia, unspecified: Secondary | ICD-10-CM

## 2013-01-31 DIAGNOSIS — I10 Essential (primary) hypertension: Secondary | ICD-10-CM

## 2013-01-31 MED ORDER — LISINOPRIL-HYDROCHLOROTHIAZIDE 20-12.5 MG PO TABS: 1.0000 | ORAL_TABLET | Freq: Every day | ORAL | Status: DC

## 2013-01-31 NOTE — Assessment & Plan Note (Addendum)
Continues to be above goal. Has previously been on higher dose of lisinopril. Will increase lisinopril to 20 mg and keep HCTZ at 12.5 mg daily. Advised to not take clonidine prn. Will see back in one month to ensure improvement in BP. Will check CMET as it has been some time since last lab check while on lisinopril/HCTZ.

## 2013-01-31 NOTE — Progress Notes (Signed)
Patient ID: Steven Simmons, male   DOB: 06-12-1940, 72 y.o.   MRN: [id number] Steven Simmons is a 72 y.o. male who presents today for follow-up of HTN.  HYPERTENSION Disease Monitoring Home BP Monitoring has not checked it recently at home, though states was previously in the 128-140 range systolic Chest pain- no    Dyspnea- no Medications Compliance-  taking. Lightheadedness-  no  Edema- no Patient states was given 5 tablets by ED physician to take as needed. States is not taking currently.  Patient notes having a cold about a month ago. Noted temp to 96.7 on digital thermometer even though it was hot outside. States he ate a fatty meal and his temperature improved to 98.6. Has not had an issue with this since then.  Past Medical History  Diagnosis Date  . Peptic ulcer disease   . Hypertension   . GERD (gastroesophageal reflux disease)     History  Smoking status  . Former Smoker  Smokeless tobacco  . Not on file    Family History  Problem Relation Age of Onset  . Heart disease Mother   . Hypertension Mother   . Heart disease Father   . Hypertension Father     Current Outpatient Prescriptions on File Prior to Visit  Medication Sig Dispense Refill  . aspirin 81 MG chewable tablet Chew 81 mg by mouth daily as needed (occasional use for headache).       . cloNIDine (CATAPRES) 0.1 MG tablet Take 0.1 mg by mouth 2 (two) times daily as needed (high blood pressure).       No current facility-administered medications on file prior to visit.    ROS: Per HPI   Physical Exam Filed Vitals:   01/31/13 1458  BP: 163/79  Pulse: 86  Temp: 98.6 F (37 C)    Physical Examination: General appearance - alert, well appearing, and in no distress Eyes - PERRL Chest - clear to auscultation, no wheezes, rales or rhonchi, symmetric air entry Heart - normal rate, regular rhythm, normal S1, S2, no murmurs, rubs, clicks or gallops Extremities - no pedal edema noted   Assessment/Plan:  Please see individual problem list.

## 2013-01-31 NOTE — Assessment & Plan Note (Signed)
Continues to refuse to take a statin as he states he is afraid it will affect his memory. Discussed importance of this given history of TIA.

## 2013-01-31 NOTE — Patient Instructions (Signed)
Nice to see you. Your blood pressure continues to be high. We will increase your lisinopril dose to get better control of your blood pressure. We will also obtain labs today to check on your electrolytes and kidneys.

## 2013-02-01 LAB — COMPREHENSIVE METABOLIC PANEL
ALT: 16 U/L (ref 0–53)
AST: 18 U/L (ref 0–37)
Albumin: 4.2 g/dL (ref 3.5–5.2)
BUN: 15 mg/dL (ref 6–23)
CO2: 26 mEq/L (ref 19–32)
Calcium: 9.7 mg/dL (ref 8.4–10.5)
Chloride: 101 mEq/L (ref 96–112)
Creat: 0.75 mg/dL (ref 0.50–1.35)
Potassium: 4.1 mEq/L (ref 3.5–5.3)

## 2013-02-02 ENCOUNTER — Telehealth: Payer: Self-pay | Admitting: *Deleted

## 2013-02-02 NOTE — Telephone Encounter (Signed)
Message copied by Henri Medal on Fri Feb 02, 2013 11:21 AM ------      Message from: Birdie Sons, ERIC G      Created: Fri Feb 02, 2013  9:36 AM       Please inform the patient that his labs were acceptable. Thanks. ------

## 2013-02-02 NOTE — Telephone Encounter (Signed)
LM for pt to call back.  Please give him message from Dr. Birdie Sons when he does.  Thanks [employer]

## 2013-02-28 ENCOUNTER — Ambulatory Visit: Payer: Medicare Other | Admitting: Family Medicine

## 2013-03-14 ENCOUNTER — Ambulatory Visit: Payer: Medicare Other | Admitting: Family Medicine

## 2013-03-19 ENCOUNTER — Encounter: Payer: Self-pay | Admitting: Family Medicine

## 2013-03-19 ENCOUNTER — Ambulatory Visit (INDEPENDENT_AMBULATORY_CARE_PROVIDER_SITE_OTHER): Payer: Medicare Other | Admitting: Family Medicine

## 2013-03-19 VITALS — BP 140/80 | HR 83 | Temp 98.4°F | Wt 202.0 lb

## 2013-03-19 DIAGNOSIS — I10 Essential (primary) hypertension: Secondary | ICD-10-CM

## 2013-03-19 LAB — BASIC METABOLIC PANEL
Calcium: 10 mg/dL (ref 8.4–10.5)
Chloride: 101 mEq/L (ref 96–112)
Creat: 0.85 mg/dL (ref 0.50–1.35)
Sodium: 139 mEq/L (ref 135–145)

## 2013-03-19 NOTE — Patient Instructions (Signed)
Nice to see you again. Please continue your lisinopril-HCTZ. I will see you back in one month.

## 2013-03-19 NOTE — Assessment & Plan Note (Signed)
At goal with increase in lisinopril dose. Will check bmet given change in dosing of lisinopril. Will continue current lisinopril/HCTZ dosing. F/u in one month.

## 2013-03-19 NOTE — Progress Notes (Signed)
Patient ID: Steven Simmons, male   DOB: Dec 31, 1940, 72 y.o.   MRN: [id number]  Marikay Alar, MD Phone: [phone]  Steven Simmons is a 72 y.o. male who presents today for HTN.  HYPERTENSION Disease Monitoring Home BP Monitoring checking, usually from 140-150 systolic Chest pain- no    Dyspnea- no Medications Compliance-  taking  Edema- no     Past Medical History  Diagnosis Date  . Peptic ulcer disease   . Hypertension   . GERD (gastroesophageal reflux disease)     History  Smoking status  . Former Smoker  Smokeless tobacco  . Not on file    Family History  Problem Relation Age of Onset  . Heart disease Mother   . Hypertension Mother   . Heart disease Father   . Hypertension Father     Current Outpatient Prescriptions on File Prior to Visit  Medication Sig Dispense Refill  . aspirin 81 MG chewable tablet Chew 81 mg by mouth daily as needed (occasional use for headache).       . lisinopril-hydrochlorothiazide (PRINZIDE,ZESTORETIC) 20-12.5 MG per tablet Take 1 tablet by mouth daily.  30 tablet  3   No current facility-administered medications on file prior to visit.    ROS: Per HPI   Physical Exam Filed Vitals:   03/19/13 1524  BP: 140/80  Pulse: 83  Temp: 98.4 F (36.9 C)    Physical Examination: General appearance - alert, well appearing, and in no distress Chest - clear to auscultation, no wheezes, rales or rhonchi, symmetric air entry Heart - normal rate, regular rhythm, normal S1, S2, no murmurs, rubs, clicks or gallops Extremities - no pedal edema noted   Assessment/Plan: Please see individual problem list.

## 2013-04-20 ENCOUNTER — Encounter: Payer: Self-pay | Admitting: Family Medicine

## 2013-04-20 ENCOUNTER — Ambulatory Visit: Payer: Medicare Other | Admitting: Family Medicine

## 2013-04-20 ENCOUNTER — Ambulatory Visit (INDEPENDENT_AMBULATORY_CARE_PROVIDER_SITE_OTHER): Payer: Medicare Other | Admitting: Family Medicine

## 2013-04-20 VITALS — BP 168/92 | HR 88 | Temp 99.0°F | Ht 70.0 in | Wt 206.0 lb

## 2013-04-20 DIAGNOSIS — I10 Essential (primary) hypertension: Secondary | ICD-10-CM

## 2013-04-20 MED ORDER — TETANUS-DIPHTH-ACELL PERTUSSIS 5-2.5-18.5 LF-MCG/0.5 IM SUSP: 0.5000 mL | Freq: Once | INTRAMUSCULAR | Status: DC

## 2013-04-20 MED ORDER — HYDROCHLOROTHIAZIDE 12.5 MG PO CAPS: 12.5000 mg | ORAL_CAPSULE | Freq: Every day | ORAL | Status: DC

## 2013-04-20 MED ORDER — LISINOPRIL 40 MG PO TABS: 40.0000 mg | ORAL_TABLET | Freq: Every day | ORAL | Status: DC

## 2013-04-20 NOTE — Progress Notes (Signed)
Patient ID: Steven Simmons, male   DOB: June 22, 1940, 73 y.o.   MRN: [id number]  [name], MD Phone: [phone]  Steven Simmons is a 73 y.o. male who presents today for HTN f/u.  HYPERTENSION Disease Monitoring Home BP Monitoring not checking Chest pain- no    Dyspnea- no Medications Compliance-  Taking. Lightheadedness-  no  Edema- no States any time he eats salty foods it will go.  Past Medical History  Diagnosis Date  . Peptic ulcer disease   . Hypertension   . GERD (gastroesophageal reflux disease)     History  Smoking status  . Former Smoker  Smokeless tobacco  . Not on file    Family History  Problem Relation Age of Onset  . Heart disease Mother   . Hypertension Mother   . Heart disease Father   . Hypertension Father     Current Outpatient Prescriptions on File Prior to Visit  Medication Sig Dispense Refill  . aspirin 81 MG chewable tablet Chew 81 mg by mouth daily as needed (occasional use for headache).       . lisinopril-hydrochlorothiazide (PRINZIDE,ZESTORETIC) 20-12.5 MG per tablet Take 1 tablet by mouth daily.  30 tablet  3   No current facility-administered medications on file prior to visit.    ROS: Per HPI   Physical Exam Filed Vitals:   04/20/13 1521  BP: 168/92  Pulse: 88  Temp: 99 F (37.2 C)    Physical Examination: General appearance - alert, well appearing, and in no distress Chest - clear to auscultation, no wheezes, rales or rhonchi, symmetric air entry Heart - normal rate, regular rhythm, normal S1, S2, no murmurs, rubs, clicks or gallops Extremities - no pedal edema noted  Assessment/Plan: Please see individual problem list.

## 2013-04-20 NOTE — Assessment & Plan Note (Signed)
Above goal today. Will plan to increase lisinopril dose to 40 mg and keep HCTZ at 12.5 mg daily. He will return for BMET in one week and for blood pressure check. I will see him in follow-up in one month. He is to check his BP at home.

## 2013-04-20 NOTE — Patient Instructions (Signed)
Nice to see you. I have increased your lisinopril to 40 mg daily and your HCTZ will remain 12.5 mg daily. We will have you return in one week to have your blood checked. I will see you back in one month. Please have them check your blood pressure when you come in for your lab visit.

## 2013-04-27 ENCOUNTER — Other Ambulatory Visit: Payer: Medicare Other

## 2013-04-27 VITALS — BP 164/88 | HR 88

## 2013-04-27 DIAGNOSIS — I10 Essential (primary) hypertension: Secondary | ICD-10-CM

## 2013-04-27 NOTE — Progress Notes (Signed)
BMP DONE TODAY Lorissa Kishbaugh 

## 2013-04-28 LAB — BASIC METABOLIC PANEL
BUN: 11 mg/dL (ref 6–23)
CALCIUM: 9.9 mg/dL (ref 8.4–10.5)
CO2: 33 mEq/L — ABNORMAL HIGH (ref 19–32)
CREATININE: 0.86 mg/dL (ref 0.50–1.35)
Chloride: 99 mEq/L (ref 96–112)
GLUCOSE: 117 mg/dL — AB (ref 70–99)
Potassium: 4.2 mEq/L (ref 3.5–5.3)
SODIUM: 138 meq/L (ref 135–145)

## 2013-05-24 ENCOUNTER — Ambulatory Visit: Payer: Medicare Other | Admitting: Family Medicine

## 2013-06-07 ENCOUNTER — Encounter: Payer: Self-pay | Admitting: Family Medicine

## 2013-06-07 ENCOUNTER — Ambulatory Visit (INDEPENDENT_AMBULATORY_CARE_PROVIDER_SITE_OTHER): Payer: Medicare Other | Admitting: Family Medicine

## 2013-06-07 VITALS — BP 158/78 | HR 74 | Temp 98.3°F | Ht 70.0 in | Wt 210.3 lb

## 2013-06-07 DIAGNOSIS — Z23 Encounter for immunization: Secondary | ICD-10-CM

## 2013-06-07 DIAGNOSIS — E785 Hyperlipidemia, unspecified: Secondary | ICD-10-CM

## 2013-06-07 DIAGNOSIS — I10 Essential (primary) hypertension: Secondary | ICD-10-CM

## 2013-06-07 MED ORDER — LISINOPRIL-HYDROCHLOROTHIAZIDE 20-25 MG PO TABS: 1.0000 | ORAL_TABLET | Freq: Every day | ORAL | Status: DC

## 2013-06-07 NOTE — Patient Instructions (Signed)
Nice to see you. Your blood pressure is still elevated above goal. I would like to increase the dose of the HCTZ and keep the lisinopril at the same dose. I will see you back in one month.

## 2013-06-09 NOTE — Assessment & Plan Note (Signed)
Not at goal. Patient states did not tolerated lisinopril 40 mg. Will decrease this back to 20 mg and increase HCTZ to 25 mg. F/u in one month.

## 2013-06-09 NOTE — Progress Notes (Signed)
Patient ID: Steven Simmons, male   DOB: 1941-03-01, 73 y.o.   MRN: [id number]  [name], MD Phone: [phone]  Steven Simmons is a 73 y.o. male who presents today for HTN f/u.  HYPERTENSION Disease Monitoring Home BP Monitoring checking, states is around 124 systolic typically, as low as 110. Chest pain- no    Dyspnea- no Medications Compliance-  taking.  Edema- no Patient states he stopped taking the higher dose of lisinopril due to developing bilateral shoulder pain. Pain resolved with decrease in dose.  The following portions of the patient's history were reviewed and updated as appropriate: allergies, current medications, past medical history, family and social history, and problem list.  Patient is a nonsmoker.  Past Medical History  Diagnosis Date  . Peptic ulcer disease   . Hypertension   . GERD (gastroesophageal reflux disease)     History  Smoking status  . Former Smoker  . Types: Cigarettes  . Quit date: 04/20/1977  Smokeless tobacco  . Not on file    Family History  Problem Relation Age of Onset  . Heart disease Mother   . Hypertension Mother   . Heart disease Father   . Hypertension Father     Current Outpatient Prescriptions on File Prior to Visit  Medication Sig Dispense Refill  . aspirin 81 MG chewable tablet Chew 81 mg by mouth daily as needed (occasional use for headache).       . Tdap (BOOSTRIX) 5-2.5-18.5 LF-MCG/0.5 injection Inject 0.5 mLs into the muscle once.  0.5 mL  0   No current facility-administered medications on file prior to visit.    ROS: Per HPI   Physical Exam Filed Vitals:   06/07/13 1639  BP: 158/78  Pulse:   Temp:     Physical Examination: General appearance - alert, well appearing, and in no distress Chest - clear to auscultation, no wheezes, rales or rhonchi, symmetric air entry Heart - normal rate, regular rhythm, normal S1, S2, no murmurs, rubs, clicks or gallops Extremities - no pedal edema  noted   Assessment/Plan: Please see individual problem list.

## 2013-06-09 NOTE — Assessment & Plan Note (Signed)
Patient continues to decline statin due to concern for side effects. Reinforced the benefit of taking this given history of TIA.

## 2013-07-09 ENCOUNTER — Ambulatory Visit (INDEPENDENT_AMBULATORY_CARE_PROVIDER_SITE_OTHER): Payer: Medicare Other | Admitting: Family Medicine

## 2013-07-09 VITALS — BP 150/84 | HR 81 | Temp 98.4°F | Wt 211.0 lb

## 2013-07-09 DIAGNOSIS — E785 Hyperlipidemia, unspecified: Secondary | ICD-10-CM

## 2013-07-09 DIAGNOSIS — M549 Dorsalgia, unspecified: Secondary | ICD-10-CM

## 2013-07-09 DIAGNOSIS — I10 Essential (primary) hypertension: Secondary | ICD-10-CM

## 2013-07-09 NOTE — Patient Instructions (Signed)
Back Pain, Adult  Back pain is very common. The pain often gets better over time. The cause of back pain is usually not dangerous. Most people can learn to manage their back pain on their own.   HOME CARE   · Stay active. Start with short walks on flat ground if you can. Try to walk farther each day.  · Do not sit, drive, or stand in one place for more than 30 minutes. Do not stay in bed.  · Do not avoid exercise or work. Activity can help your back heal faster.  · Be careful when you bend or lift an object. Bend at your knees, keep the object close to you, and do not twist.  · Sleep on a firm mattress. Lie on your side, and bend your knees. If you lie on your back, put a pillow under your knees.  · Only take medicines as told by your doctor.  · Put ice on the injured area.  · Put ice in a plastic bag.  · Place a towel between your skin and the bag.  · Leave the ice on for 15-20 minutes, 03-04 times a day for the first 2 to 3 days. After that, you can switch between ice and heat packs.  · Ask your doctor about back exercises or massage.  · Avoid feeling anxious or stressed. Find good ways to deal with stress, such as exercise.  GET HELP RIGHT AWAY IF:   · Your pain does not go away with rest or medicine.  · Your pain does not go away in 1 week.  · You have new problems.  · You do not feel well.  · The pain spreads into your legs.  · You cannot control when you poop (bowel movement) or pee (urinate).  · Your arms or legs feel weak or lose feeling (numbness).  · You feel sick to your stomach (nauseous) or throw up (vomit).  · You have belly (abdominal) pain.  · You feel like you may pass out (faint).  MAKE SURE YOU:   · Understand these instructions.  · Will watch your condition.  · Will get help right away if you are not doing well or get worse.  Document Released: 09/08/2007 Document Revised: 06/14/2011 Document Reviewed: 08/10/2010  [employer]® Patient Information ©2014 [employer].

## 2013-07-11 DIAGNOSIS — M549 Dorsalgia, unspecified: Secondary | ICD-10-CM | POA: Insufficient documentation

## 2013-07-11 NOTE — Assessment & Plan Note (Signed)
Continues to refuse to take a statin. Will continue to discuss this with the patient.

## 2013-07-11 NOTE — Assessment & Plan Note (Signed)
Right at goal on BP check today. Will continue current regimen. F/u in one month.

## 2013-07-11 NOTE — Assessment & Plan Note (Signed)
Improving on its own with ice and back brace. Advised supportive care. Return precautions given.

## 2013-07-11 NOTE — Progress Notes (Signed)
Patient ID: Laiken Nohr, male   DOB: Nov 22, 1940, 73 y.o.   MRN: [id number]  [name], MD Phone: [phone]  Andi Mahaffy is a 73 y.o. male who presents today for f/u.  Back pain: patient notes he has been traipsing around the woods recently for a photo shoot and his back is now sore. He notes it was worsened when he bent over without bending his knees. States he has had this before and it just takes some time for this to improve. Will typically improve for a fairly long time then will recur. He denies urinary or bowel issues. He has used heat and a back brace along with left over percocet with some benefit. He feels this is improving.  HYPERTENSION Disease Monitoring Home BP Monitoring intermittently checking  Chest pain- no    Dyspnea- no Medications Compliance-  taking.  Edema- no  HYPERLIPIDEMIA Symptoms See HTN for ROS Medications: Compliance- continues to refuse to take statin after many discussions  Patient is a nonsmoker.  PMH: acute back injury  ROS: Per HPI   Physical Exam Filed Vitals:   07/09/13 1531  BP: 150/84  Pulse: 81  Temp: 98.4 F (36.9 C)    Physical Examination: General appearance - alert, well appearing, and in no distress Eyes - pupils equal and reactive, extraocular eye movements intact Chest - clear to auscultation, no wheezes, rales or rhonchi, symmetric air entry Heart - normal rate, regular rhythm, normal S1, S2, no murmurs, rubs, clicks or gallops Neurological - CN 2-12 intact, 5/5 strength in bilateral biceps, triceps, grip, hip flexors, quads, plantar and dorsiflexion, sensation to light touch intact, reflexes absent bilateral achilles Musculoskeletal - back with no erythema or swelling, non-tender to palpation, good ROM Extremities - no pedal edema noted   Assessment/Plan: Please see individual problem list.

## 2013-08-07 ENCOUNTER — Ambulatory Visit (INDEPENDENT_AMBULATORY_CARE_PROVIDER_SITE_OTHER): Payer: Medicare Other | Admitting: Family Medicine

## 2013-08-07 ENCOUNTER — Encounter: Payer: Self-pay | Admitting: Family Medicine

## 2013-08-07 VITALS — BP 144/84 | HR 87 | Temp 98.2°F | Ht 70.0 in | Wt 210.0 lb

## 2013-08-07 DIAGNOSIS — I10 Essential (primary) hypertension: Secondary | ICD-10-CM

## 2013-08-07 MED ORDER — LISINOPRIL-HYDROCHLOROTHIAZIDE 20-25 MG PO TABS: 1.0000 | ORAL_TABLET | Freq: Every day | ORAL | Status: DC

## 2013-08-07 NOTE — Patient Instructions (Signed)
Nice to see you. Glad you are doing so well. Please continue to take your blood pressure medication. I will see you back in 3 months.

## 2013-08-07 NOTE — Assessment & Plan Note (Signed)
At goal for 2 consecutive months. Will continue current regimen. Refill given. F/u in 3 months.

## 2013-08-07 NOTE — Progress Notes (Signed)
Patient ID: Steven Simmons, male   DOB: Oct 28, 1940, 73 y.o.   MRN: [id number]  [name], MD Phone: [phone]  Steven Simmons is a 73 y.o. male who presents today for f/u.  HYPERTENSION Disease Monitoring Home BP Monitoring not checking Chest pain- no    Dyspnea- no Medications Compliance-  taking.   Edema- no  Patient is a nonsmoker.   ROS: Per HPI   Physical Exam Filed Vitals:   08/07/13 1555  BP: 144/84  Pulse: 87  Temp: 98.2 F (36.8 C)    Gen: Well NAD Lungs: CTABL Nl WOB Heart: RRR no MRG Exts: Non edematous BL  LE, warm and well perfused.    Assessment/Plan: Please see individual problem list.

## 2013-09-13 ENCOUNTER — Observation Stay (HOSPITAL_COMMUNITY)
Admission: EM | Admit: 2013-09-13 | Discharge: 2013-09-13 | Discharge status: Against Medical Advice | Payer: Medicare Other | Attending: Family Medicine | Admitting: Family Medicine

## 2013-09-13 ENCOUNTER — Encounter (HOSPITAL_COMMUNITY): Payer: Self-pay | Admitting: Emergency Medicine

## 2013-09-13 ENCOUNTER — Emergency Department (HOSPITAL_COMMUNITY): Payer: Medicare Other

## 2013-09-13 DIAGNOSIS — Z8673 Personal history of transient ischemic attack (TIA), and cerebral infarction without residual deficits: Secondary | ICD-10-CM | POA: Insufficient documentation

## 2013-09-13 DIAGNOSIS — R4701 Aphasia: Secondary | ICD-10-CM

## 2013-09-13 DIAGNOSIS — K219 Gastro-esophageal reflux disease without esophagitis: Secondary | ICD-10-CM | POA: Insufficient documentation

## 2013-09-13 DIAGNOSIS — R29810 Facial weakness: Secondary | ICD-10-CM | POA: Insufficient documentation

## 2013-09-13 DIAGNOSIS — R209 Unspecified disturbances of skin sensation: Secondary | ICD-10-CM | POA: Insufficient documentation

## 2013-09-13 DIAGNOSIS — K279 Peptic ulcer, site unspecified, unspecified as acute or chronic, without hemorrhage or perforation: Secondary | ICD-10-CM | POA: Insufficient documentation

## 2013-09-13 DIAGNOSIS — I10 Essential (primary) hypertension: Secondary | ICD-10-CM | POA: Insufficient documentation

## 2013-09-13 DIAGNOSIS — R4789 Other speech disturbances: Principal | ICD-10-CM | POA: Insufficient documentation

## 2013-09-13 DIAGNOSIS — G459 Transient cerebral ischemic attack, unspecified: Secondary | ICD-10-CM | POA: Diagnosis present

## 2013-09-13 DIAGNOSIS — Z87891 Personal history of nicotine dependence: Secondary | ICD-10-CM | POA: Insufficient documentation

## 2013-09-13 HISTORY — DX: Personal history of transient ischemic attack (TIA), and cerebral infarction without residual deficits: Z86.73

## 2013-09-13 LAB — RAPID URINE DRUG SCREEN, HOSP PERFORMED
Amphetamines: NOT DETECTED
BARBITURATES: NOT DETECTED
Benzodiazepines: NOT DETECTED
Cocaine: NOT DETECTED
Opiates: NOT DETECTED
TETRAHYDROCANNABINOL: NOT DETECTED

## 2013-09-13 LAB — URINALYSIS, ROUTINE W REFLEX MICROSCOPIC
Bilirubin Urine: NEGATIVE
Glucose, UA: NEGATIVE mg/dL
Hgb urine dipstick: NEGATIVE
Ketones, ur: NEGATIVE mg/dL
Leukocytes, UA: NEGATIVE
NITRITE: NEGATIVE
PROTEIN: NEGATIVE mg/dL
SPECIFIC GRAVITY, URINE: 1.026 (ref 1.005–1.030)
Urobilinogen, UA: 1 mg/dL (ref 0.0–1.0)
pH: 5 (ref 5.0–8.0)

## 2013-09-13 LAB — DIFFERENTIAL
BASOS PCT: 0 % (ref 0–1)
Basophils Absolute: 0 10*3/uL (ref 0.0–0.1)
Eosinophils Absolute: 0.1 10*3/uL (ref 0.0–0.7)
Eosinophils Relative: 1 % (ref 0–5)
Lymphocytes Relative: 17 % (ref 12–46)
Lymphs Abs: 1.6 10*3/uL (ref 0.7–4.0)
MONOS PCT: 5 % (ref 3–12)
Monocytes Absolute: 0.5 10*3/uL (ref 0.1–1.0)
NEUTROS ABS: 7.7 10*3/uL (ref 1.7–7.7)
NEUTROS PCT: 77 % (ref 43–77)

## 2013-09-13 LAB — COMPREHENSIVE METABOLIC PANEL
ALT: 14 U/L (ref 0–53)
AST: 19 U/L (ref 0–37)
Albumin: 3.8 g/dL (ref 3.5–5.2)
Alkaline Phosphatase: 110 U/L (ref 39–117)
BILIRUBIN TOTAL: 0.4 mg/dL (ref 0.3–1.2)
BUN: 23 mg/dL (ref 6–23)
CHLORIDE: 102 meq/L (ref 96–112)
CO2: 23 mEq/L (ref 19–32)
Calcium: 9.7 mg/dL (ref 8.4–10.5)
Creatinine, Ser: 0.9 mg/dL (ref 0.50–1.35)
GFR calc Af Amer: >90 mL/min (ref >90)
GFR calc non Af Amer: 84 mL/min — ABNORMAL LOW (ref >90)
Glucose, Bld: 211 mg/dL — ABNORMAL HIGH (ref 70–99)
POTASSIUM: 3.5 meq/L — AB (ref 3.7–5.3)
SODIUM: 141 meq/L (ref 137–147)
Total Protein: 7.8 g/dL (ref 6.0–8.3)

## 2013-09-13 LAB — CBC
HCT: 42.1 % (ref 39.0–52.0)
HEMOGLOBIN: 14.6 g/dL (ref 13.0–17.0)
MCH: 31.1 pg (ref 26.0–34.0)
MCHC: 34.7 g/dL (ref 30.0–36.0)
MCV: 89.6 fL (ref 78.0–100.0)
Platelets: 343 10*3/uL (ref 150–400)
RBC: 4.7 MIL/uL (ref 4.22–5.81)
RDW: 13.3 % (ref 11.5–15.5)
WBC: 9.9 10*3/uL (ref 4.0–10.5)

## 2013-09-13 LAB — I-STAT CHEM 8, ED
BUN: 27 mg/dL — ABNORMAL HIGH (ref 6–23)
CALCIUM ION: 1.16 mmol/L (ref 1.13–1.30)
CHLORIDE: 103 meq/L (ref 96–112)
Creatinine, Ser: 1 mg/dL (ref 0.50–1.35)
Glucose, Bld: 211 mg/dL — ABNORMAL HIGH (ref 70–99)
HEMATOCRIT: 44 % (ref 39.0–52.0)
HEMOGLOBIN: 15 g/dL (ref 13.0–17.0)
Potassium: 3.6 mEq/L — ABNORMAL LOW (ref 3.7–5.3)
Sodium: 143 mEq/L (ref 137–147)
TCO2: 24 mmol/L (ref 0–100)

## 2013-09-13 LAB — PROTIME-INR
INR: 1.05 (ref 0.00–1.49)
PROTHROMBIN TIME: 13.5 s (ref 11.6–15.2)

## 2013-09-13 LAB — ETHANOL

## 2013-09-13 LAB — I-STAT TROPONIN, ED: TROPONIN I, POC: 0.01 ng/mL (ref 0.00–0.08)

## 2013-09-13 LAB — APTT: aPTT: 26 seconds (ref 24–37)

## 2013-09-13 MED ORDER — ASPIRIN 81 MG PO CHEW
324.0000 mg | CHEWABLE_TABLET | Freq: Once | ORAL | Status: AC
  Administered 2013-09-13: 324 mg via ORAL
  Filled 2013-09-13: qty 4

## 2013-09-13 MED ORDER — SODIUM CHLORIDE 0.9 % IV BOLUS (SEPSIS)
1000.0000 mL | Freq: Once | INTRAVENOUS | Status: AC
  Administered 2013-09-13: 1000 mL via INTRAVENOUS

## 2013-09-13 NOTE — Progress Notes (Signed)
Received report from [place] from [facility] ED at 2130. Room ready and awaiting arrival of pt to 4N04.

## 2013-09-13 NOTE — ED Notes (Signed)
Provided pt with urinal. Explained we needed a sample and he stated he couldn't at this time, "He is dry." Told him to notify when he was able to void urine.

## 2013-09-13 NOTE — ED Notes (Addendum)
Pt stated he was eating burger with friend. Noticed slurred speech and difficulty forming thoughts. Hx of TIA last year. Pt has R side facial droop and decreased sensation in L leg. Pt alert and oriented. Pt reports migraine right before symptom start.

## 2013-09-13 NOTE — Discharge Instructions (Signed)
Return to the ED with any concerns including severe headache, weakness of arms or legs, changes in vision or speech, decreased level of alertness/lethargy, or any other alarming symptoms

## 2013-09-13 NOTE — ED Provider Notes (Signed)
CSN: [id number]     Arrival date & time 09/13/13  1828 History   First MD Initiated Contact with Patient 09/13/13 1846     Chief Complaint  Patient presents with  . Stroke Symptoms     (Consider location/radiation/quality/duration/timing/severity/associated sxs/prior Treatment) HPI Pt presents with c/o slurred speech and difficulty with word finding which began approx 20 minutes prior to arrival.  Upon arrival to the ED his symptoms have resolved.  Mild frontal headache associated  He deneis changes in vision.  No focal weakness or numbness. He states he has had simlar episode in the past and was told he had a TIA.  He does take aspirin daily.  No fever/chills.  No other recent illness.  Friend who accompanies him reports he had right side facial droop. Symptoms have resolved upon arrival to the ED.  There are no other associated systemic symptoms, there are no other alleviating or modifying factors.   Past Medical History  Diagnosis Date  . Peptic ulcer disease   . Hypertension   . GERD (gastroesophageal reflux disease)   . History of TIA (transient ischemic attack)    Past Surgical History  Procedure Laterality Date  . Cholecystectomy     Family History  Problem Relation Age of Onset  . Heart disease Mother   . Hypertension Mother   . Heart disease Father   . Hypertension Father    History  Substance Use Topics  . Smoking status: Former Smoker    Types: Cigarettes    Quit date: 04/20/1977  . Smokeless tobacco: Not on file  . Alcohol Use: No    Review of Systems ROS reviewed and all otherwise negative except for mentioned in HPI    Allergies  Ceclor; Veralipride; Antihistamines, chlorpheniramine-type; Beta adrenergic blockers; Celebrex; Ibuprofen; Nutrasweet aspartame; and Penicillins  Home Medications   Prior to Admission medications   Medication Sig Start Date End Date Taking? Authorizing Provider  acetaminophen (TYLENOL) 500 MG tablet Take 500 mg by mouth every  6 (six) hours as needed for headache.   Yes Historical Provider, MD  aspirin 81 MG chewable tablet Chew 81 mg by mouth daily as needed (occasional use for headache).    Yes Historical Provider, MD  lisinopril-hydrochlorothiazide (PRINZIDE,ZESTORETIC) 20-25 MG per tablet Take 1 tablet by mouth daily. 08/07/13  Yes Leone Haven, MD  magnesium oxide (MAG-OX) 400 MG tablet Take 400 mg by mouth daily.   Yes Historical Provider, MD  potassium chloride SA (K-DUR,KLOR-CON) 20 MEQ tablet Take 20 mEq by mouth daily.   Yes Historical Provider, MD   BP 158/80  Pulse 117  Temp(Src) 98.2 F (36.8 C) (Oral)  Resp 16  SpO2 97% Vitals reviewed Physical Exam Physical Examination: General appearance - alert, well appearing, and in no distress Mental status - alert, oriented to person, place, and time Eyes - pupils equal and reactive, extraocular eye movements intact Mouth - mucous membranes moist, pharynx normal without lesions Chest - clear to auscultation, no wheezes, rales or rhonchi, symmetric air entry Heart - normal rate, regular rhythm, normal S1, S2, no murmurs, rubs, clicks or gallops Abdomen - soft, nontender, nondistended, no masses or organomegaly Neurological - alert, oriented x 3, normal speech, cranial nerves 2-12 tested and intact, strength 5/5 in extremities x 4, sensation intact Extremities - peripheral pulses normal, no pedal edema, no clubbing or cyanosis Skin - normal coloration and turgor, no rashes  ED Course  Procedures (including critical care time)  6:48 PM pt seen and  evaluated, all symptoms have resolved at this time.    8:25 PM d/w family practice resident, pt will be transferred to telemetry bed at [facility] cone for further TIA workup. Pt continues to have no recurrence of symptoms in the ED.   10:28 PM pt has ready bed and I[place]- he now states that he feels fine and does not want to be admitted.  Pt states he feels that this was more likely a migraine.  I have talked  with him about the reasons for admission and further workup.  Complicated migraine is on the differential but cannot rule out TIA without further workup including  MRI.  Pt verbalizes understanding and would still like to go home.  Will sign out AMA.  Labs Review Labs Reviewed  COMPREHENSIVE METABOLIC PANEL - Abnormal; Notable for the following:    Potassium 3.5 (*)    Glucose, Bld 211 (*)    GFR calc non Af Amer 84 (*)    All other components within normal limits  I-STAT CHEM 8, ED - Abnormal; Notable for the following:    Potassium 3.6 (*)    BUN 27 (*)    Glucose, Bld 211 (*)    All other components within normal limits  ETHANOL  CBC  DIFFERENTIAL  URINE RAPID DRUG SCREEN (HOSP PERFORMED)  URINALYSIS, ROUTINE W REFLEX MICROSCOPIC  PROTIME-INR  APTT  I-STAT TROPOININ, ED    Imaging Review Ct Head Wo Contrast  09/13/2013   CLINICAL DATA:  Slurred speech  EXAM: CT HEAD WITHOUT CONTRAST  TECHNIQUE: Contiguous axial images were obtained from the base of the skull through the vertex without intravenous contrast.  COMPARISON:  06/29/2012  FINDINGS: Global atrophy. Chronic ischemic changes in the periventricular white matter. No mass effect, midline shift, or acute intracranial hemorrhage.  IMPRESSION: No acute intracranial pathology.   Electronically Signed   By: Maryclare Bean M.D.   On: 09/13/2013 19:31     EKG Interpretation   Date/Time:  Thursday September 13 2013 18:34:10 EDT Ventricular Rate:  117 PR Interval:  172 QRS Duration: 86 QT Interval:  315 QTC Calculation: 439 R Axis:   20 Text Interpretation:  Sinus tachycardia Right atrial enlargement  nonspecific st changes Since previous tracing rate faster Confirmed by  [facility]  MD, Alzena Gerber [phone]) on 09/13/2013 11:57:38 PM      MDM   Final diagnoses:  TIA (transient ischemic attack)  Aphasia    Pt presenting with c/o aphasia and right sided facial droop. Symptoms completely resolved.  D/w family practice resident for admission  for TIA workup.  Pt has declined admission.  I have discussed all the risks with him including worsening symptoms, major stroke, morbidity and even death.  He states he understands these risks and does not want to proceed with any further testing.  He agrees to call family medicine for a followup appointment tomorrow.      Threasa Beards, MD 09/14/13 0000

## 2013-09-13 NOTE — ED Notes (Signed)
Pt leaving AMA. MD aware and has spoken with pt.

## 2013-09-13 NOTE — ED Notes (Signed)
Patient transported to CT 

## 2013-09-13 NOTE — ED Notes (Signed)
Report called to [facility]. [facility] will be to facility in 1 1/2.

## 2013-12-28 ENCOUNTER — Ambulatory Visit (INDEPENDENT_AMBULATORY_CARE_PROVIDER_SITE_OTHER): Payer: Medicare Other | Admitting: Family Medicine

## 2013-12-28 ENCOUNTER — Encounter: Payer: Self-pay | Admitting: Family Medicine

## 2013-12-28 VITALS — BP 160/88 | HR 96 | Temp 98.6°F | Ht 70.0 in | Wt 215.0 lb

## 2013-12-28 DIAGNOSIS — Z23 Encounter for immunization: Secondary | ICD-10-CM

## 2013-12-28 DIAGNOSIS — E785 Hyperlipidemia, unspecified: Secondary | ICD-10-CM

## 2013-12-28 DIAGNOSIS — I10 Essential (primary) hypertension: Secondary | ICD-10-CM

## 2013-12-28 DIAGNOSIS — N529 Male erectile dysfunction, unspecified: Secondary | ICD-10-CM

## 2013-12-28 LAB — BASIC METABOLIC PANEL
BUN: 9 mg/dL (ref 6–23)
CALCIUM: 9.9 mg/dL (ref 8.4–10.5)
CO2: 30 mEq/L (ref 19–32)
Chloride: 97 mEq/L (ref 96–112)
Creat: 0.95 mg/dL (ref 0.50–1.35)
GLUCOSE: 129 mg/dL — AB (ref 70–99)
POTASSIUM: 3.8 meq/L (ref 3.5–5.3)
Sodium: 138 mEq/L (ref 135–145)

## 2013-12-28 LAB — POCT GLYCOSYLATED HEMOGLOBIN (HGB A1C): Hemoglobin A1C: 5.7

## 2013-12-28 MED ORDER — TADALAFIL 10 MG PO TABS: 10.0000 mg | ORAL_TABLET | Freq: Every day | ORAL | Status: DC | PRN

## 2013-12-28 MED ORDER — LISINOPRIL-HYDROCHLOROTHIAZIDE 20-12.5 MG PO TABS: 2.0000 | ORAL_TABLET | Freq: Every day | ORAL | Status: DC

## 2013-12-28 NOTE — Assessment & Plan Note (Signed)
Not at goal. Will increase lisinopril to 40 mg daily and keep HCTZ at 25 mg daily. Will check BMET today and have him return in one week for repeat BMET. F/u in one month.

## 2013-12-28 NOTE — Assessment & Plan Note (Signed)
Patient with reported erectile dysfunction. No obvious abnormalities on GU exam. Will check A1c and lipid panel for risk factors. Will start on cialis prn. Discussed that these can not be taken with nitrates and that if he develops chest pain he needs to inform EMS or the ED that he has taken cialis if he has taken this medication. F/u in one month.

## 2013-12-28 NOTE — Assessment & Plan Note (Signed)
Continues to refuse statin therapy. Will continue to encourage him to start a statin.

## 2013-12-28 NOTE — Progress Notes (Signed)
Patient ID: Steven Simmons, male   DOB: 1940/12/01, 73 y.o.   MRN: [id number]  [name], MD Phone: [phone]  Steven Simmons is a 73 y.o. male who presents today for f/u.  HYPERTENSION Disease Monitoring Home BP Monitoring not checking Chest pain- no    Dyspnea- no Medications Compliance-  Taking lisinopril-HCTZ.   Edema- no  HYPERLIPIDEMIA Symptoms Chest pain on exertion:  no   Leg claudication:   no Medications: has refused to take statin in the past.  Erectile dysfunction: notes this has been an issue for the past 2 years, though he has never spoken to a doctor about this or taken medication for this. He feels it is getting worse. He notes he is able to get "sort of erections" and can't maintain them. He denies urinary issues. He denies getting erections at night. He has not taken any medications for this previously. He notes no claudication symptoms.   Patient is a former smoker.   ROS: Per HPI   Physical Exam Filed Vitals:   12/28/13 1351  BP: 160/88  Pulse: 96  Temp: 98.6 F (37 C)    Gen: Well NAD HEENT: PERRL,  MMM Lungs: CTABL Nl WOB Heart: RRR no MRG GU: normal appearance of penis, no palpable abnormalities of the penis, normal testicles and vas deferens, no hernias noted Exts: Non edematous BL  LE, warm and well perfused.    Assessment/Plan: Please see individual problem list.  # Healthcare maintenance: flu shot given, advised on going to the pharmacy for Tdap  [name], MD [place] PGY-3

## 2013-12-28 NOTE — Patient Instructions (Signed)
Nice to see you. Please come back for a lab appointment to to have blood drawn.  I will see you back in one month for follow-up

## 2013-12-31 ENCOUNTER — Other Ambulatory Visit: Payer: Self-pay | Admitting: Family Medicine

## 2013-12-31 ENCOUNTER — Encounter: Payer: Self-pay | Admitting: Family Medicine

## 2013-12-31 DIAGNOSIS — I10 Essential (primary) hypertension: Secondary | ICD-10-CM

## 2014-01-03 ENCOUNTER — Other Ambulatory Visit: Payer: Medicare Other

## 2014-01-03 DIAGNOSIS — I10 Essential (primary) hypertension: Secondary | ICD-10-CM

## 2014-01-03 NOTE — Progress Notes (Signed)
BMP,TSH AND FLP DONE TODAY Steven Simmons

## 2014-01-04 LAB — BASIC METABOLIC PANEL
BUN: 17 mg/dL (ref 6–23)
CALCIUM: 9.5 mg/dL (ref 8.4–10.5)
CO2: 29 mEq/L (ref 19–32)
Chloride: 99 mEq/L (ref 96–112)
Creat: 0.87 mg/dL (ref 0.50–1.35)
Glucose, Bld: 115 mg/dL — ABNORMAL HIGH (ref 70–99)
POTASSIUM: 3.9 meq/L (ref 3.5–5.3)
SODIUM: 137 meq/L (ref 135–145)

## 2014-01-04 LAB — TSH: TSH: 2.14 u[IU]/mL (ref 0.350–4.500)

## 2014-01-04 LAB — LIPID PANEL
CHOL/HDL RATIO: 4.9 ratio
Cholesterol: 204 mg/dL — ABNORMAL HIGH (ref 0–200)
HDL: 42 mg/dL (ref >39)
LDL CALC: 136 mg/dL — AB (ref 0–99)
Triglycerides: 132 mg/dL (ref <150)
VLDL: 26 mg/dL (ref 0–40)

## 2014-01-08 ENCOUNTER — Encounter: Payer: Self-pay | Admitting: Family Medicine

## 2014-01-25 ENCOUNTER — Ambulatory Visit (INDEPENDENT_AMBULATORY_CARE_PROVIDER_SITE_OTHER): Payer: Medicare Other | Admitting: Family Medicine

## 2014-01-25 ENCOUNTER — Encounter: Payer: Self-pay | Admitting: Family Medicine

## 2014-01-25 VITALS — BP 195/83 | HR 103 | Temp 97.6°F | Wt 217.0 lb

## 2014-01-25 DIAGNOSIS — R05 Cough: Secondary | ICD-10-CM

## 2014-01-25 DIAGNOSIS — I10 Essential (primary) hypertension: Secondary | ICD-10-CM

## 2014-01-25 DIAGNOSIS — R059 Cough, unspecified: Secondary | ICD-10-CM | POA: Insufficient documentation

## 2014-01-25 MED ORDER — BENZONATATE 100 MG PO CAPS: 100.0000 mg | ORAL_CAPSULE | Freq: Two times a day (BID) | ORAL | Status: DC | PRN

## 2014-01-25 MED ORDER — ALBUTEROL SULFATE HFA 108 (90 BASE) MCG/ACT IN AERS
2.0000 | INHALATION_SPRAY | Freq: Four times a day (QID) | RESPIRATORY_TRACT | Status: DC | PRN
Start: 2014-01-25 — End: 2016-09-20

## 2014-01-25 NOTE — Assessment & Plan Note (Signed)
Elevated today but reports taking medication about an hour ago. Has follow up with Dr. Caryl Bis on Monday of next week.

## 2014-01-25 NOTE — Progress Notes (Signed)
    Subjective   Steven Simmons is a 73 y.o. male that presents for a same day visit  1. Cough: symptoms have been present for one week. It has been nonproductive. He hasn't been able to sleep due to the coughing. Denies any fevers but woke up with sweaty bed seats this morning. Has been around sick contacts with similar symptoms. Denies any rhinorrhea or itchy/water eyes. He denies any diarrhea, edema or shortness of breath or chest pain. Hasn't had any weight loss recently.  2. BP elevated but reports taking blood pressure pill about an hour ago. Also feels that being sick has increased his blood pressure as well as not sleeping much last night.   History  Substance Use Topics  . Smoking status: Former Smoker    Types: Cigarettes    Quit date: 04/20/1977  . Smokeless tobacco: Not on file  . Alcohol Use: No    ROS Per HPI  Objective   BP 195/83  Pulse 103  Temp(Src) 97.6 F (36.4 C) (Oral)  Wt 217 lb (98.431 kg)  SpO2 92%  General: NAD, alert, well appearing, coughing during exam j HEENT: No LAD, [place]/AT. oropharync clear  Respiratory/Chest: CTAB, no wheezing, rhonchi or rales  Cardiovascular: tachycardia, S1S2, no rubs gallops or leaps     Extremities: no edema, pulses intact  Neuro: no gross deficits.   Assessment and Plan   Please refer to problem based charting of assessment and plan

## 2014-01-25 NOTE — Patient Instructions (Addendum)
Thank you for coming in,   Please let me know if the medication is too expensive.   The albuterol can make your heart race so be mindful of its use.   Please return if your symptoms don't clear up in 7-10 days.   Please feel free to call with any questions or concerns at any time, at [phone]. --Dr. Raeford Razor Upper Respiratory Infection, Adult An upper respiratory infection (URI) is also known as the common cold. It is often caused by a type of germ (virus). Colds are easily spread (contagious). You can pass it to others by kissing, coughing, sneezing, or drinking out of the same glass. Usually, you get better in 1 or 2 weeks.  HOME CARE   Only take medicine as told by your doctor.  Use a warm mist humidifier or breathe in steam from a hot shower.  Drink enough water and fluids to keep your pee (urine) clear or pale yellow.  Get plenty of rest.  Return to work when your temperature is back to normal or as told by your doctor. You may use a face mask and wash your hands to stop your cold from spreading. GET HELP RIGHT AWAY IF:   After the first few days, you feel you are getting worse.  You have questions about your medicine.  You have chills, shortness of breath, or brown or red spit (mucus).  You have yellow or brown snot (nasal discharge) or pain in the face, especially when you bend forward.  You have a fever, puffy (swollen) neck, pain when you swallow, or white spots in the back of your throat.  You have a bad headache, ear pain, sinus pain, or chest pain.  You have a high-pitched whistling sound when you breathe in and out (wheezing).  You have a lasting cough or cough up blood.  You have sore muscles or a stiff neck. MAKE SURE YOU:   Understand these instructions.  Will watch your condition.  Will get help right away if you are not doing well or get worse. Document Released: 09/08/2007 Document Revised: 06/14/2011 Document Reviewed: 06/27/2013 [facility] Patient  Information 2015 [place], [place]. This information is not intended to replace advice given to you by your health care provider. Make sure you discuss any questions you have with your health care provider.

## 2014-01-25 NOTE — Assessment & Plan Note (Signed)
Most likely associated with URI. No weight loss or hematemesis.  - Albuterol PRN. Given warning about HR and if continued to be elevated  - tessalon perles for cough PRN  - supportive care: netti pot, nasal saline rinses, humidifier  - consider zyrtec or Flonase if symptoms persist.

## 2014-01-28 ENCOUNTER — Encounter: Payer: Self-pay | Admitting: Family Medicine

## 2014-01-28 ENCOUNTER — Ambulatory Visit (INDEPENDENT_AMBULATORY_CARE_PROVIDER_SITE_OTHER): Payer: Medicare Other | Admitting: Family Medicine

## 2014-01-28 VITALS — BP 159/85 | HR 74 | Temp 98.7°F | Ht 70.0 in | Wt 216.0 lb

## 2014-01-28 DIAGNOSIS — I10 Essential (primary) hypertension: Secondary | ICD-10-CM

## 2014-01-28 DIAGNOSIS — R05 Cough: Secondary | ICD-10-CM

## 2014-01-28 DIAGNOSIS — R059 Cough, unspecified: Secondary | ICD-10-CM

## 2014-01-28 LAB — BASIC METABOLIC PANEL
BUN: 18 mg/dL (ref 6–23)
CALCIUM: 9.4 mg/dL (ref 8.4–10.5)
CHLORIDE: 99 meq/L (ref 96–112)
CO2: 30 meq/L (ref 19–32)
Creat: 0.76 mg/dL (ref 0.50–1.35)
GLUCOSE: 123 mg/dL — AB (ref 70–99)
Potassium: 4.3 mEq/L (ref 3.5–5.3)
SODIUM: 138 meq/L (ref 135–145)

## 2014-01-28 MED ORDER — GUAIFENESIN-CODEINE 100-10 MG/5ML PO SOLN: 10.0000 mL | Freq: Three times a day (TID) | ORAL | Status: DC | PRN

## 2014-01-28 NOTE — Patient Instructions (Addendum)
Nice to meet you. You need to take 2 tablets of your lisinopril HCTZ. We will check labs and call you with the results.  For your cough I have given you a prescription for codeine cough syrup. This has mucinex in it.  If you develop fever, shortness of breath, or do not continue to improve please return for follow-up.  You can stop taking the tessalon. Please return in 2 weeks for a nurse visit for a blood pressure check.

## 2014-01-28 NOTE — Progress Notes (Signed)
Patient ID: Steven Simmons, male   DOB: 11-Sep-1940, 73 y.o.   MRN: [id number]  [name], MD Phone: [phone]  Steven Simmons is a 73 y.o. male who presents today for f/u.  HYPERTENSION Disease Monitoring Home BP Monitoring not checking Chest pain- no    Dyspnea- no Medications Compliance-  Taking 1/2 the dose he is prescribed. Edema- no  Cough: notes this has been present for 10 days. The cough has worsened. Seen on Friday for this by Dr Raeford Razor. Given tessalon for cough and this worked only on the first dose. Albuterol was too expensive to purchase. Congestion has improved, though has mild dull ache in sinuses. No sore throat, No ear fullness. Mild rhinorrhea. Cough has kept him awake at night. Denies shortness of breath and fever with this. He tried antihistamines though this did not help.    Patient is a former smoker.   ROS: Per HPI   Physical Exam Filed Vitals:   01/28/14 1539  BP: 159/85  Pulse: 74  Temp:     Gen: NAD HEENT: PERRL,  MMM, bilateral TM normal, no OP erythema, no cervical LAD Lungs: CTABL Nl WOB Heart: RRR no MRG Exts: Non edematous BL  LE, warm and well perfused.    Assessment/Plan: Please see individual problem list.  # Healthcare maintenance: need to discuss immunizations at next visit  [name], MD [place] PGY-3

## 2014-01-28 NOTE — Assessment & Plan Note (Signed)
Patient with continued cough likely a post-viral cough. This has not responded to tessalon. Will give trial of codeine-guaifenesin cough syrup. No indications for antibiotics at this time with no fever and normal exam. F/u prn for this issue.

## 2014-01-28 NOTE — Assessment & Plan Note (Addendum)
Not at goal. Patient is not taking medication as prescribed. He is to increase to lisinopril-HCTZ 40 mg/25 mg daily. BMET today and in one week. Nurse visit in 2 weeks for blood pressure check. F/u with me in 4-6 weeks.

## 2014-01-30 ENCOUNTER — Encounter: Payer: Self-pay | Admitting: Family Medicine

## 2014-02-11 ENCOUNTER — Ambulatory Visit (INDEPENDENT_AMBULATORY_CARE_PROVIDER_SITE_OTHER): Payer: Medicare Other | Admitting: *Deleted

## 2014-02-11 ENCOUNTER — Encounter: Payer: Self-pay | Admitting: Family Medicine

## 2014-02-11 ENCOUNTER — Other Ambulatory Visit: Payer: Self-pay | Admitting: Family Medicine

## 2014-02-11 ENCOUNTER — Telehealth: Payer: Self-pay | Admitting: *Deleted

## 2014-02-11 VITALS — BP 160/90 | HR 100

## 2014-02-11 DIAGNOSIS — Z136 Encounter for screening for cardiovascular disorders: Secondary | ICD-10-CM

## 2014-02-11 DIAGNOSIS — I10 Essential (primary) hypertension: Secondary | ICD-10-CM

## 2014-02-11 DIAGNOSIS — Z0130 Encounter for examination of blood pressure without abnormal findings: Secondary | ICD-10-CM

## 2014-02-11 MED ORDER — AMLODIPINE BESYLATE 10 MG PO TABS: 10.0000 mg | ORAL_TABLET | Freq: Every day | ORAL | Status: DC

## 2014-02-11 NOTE — Telephone Encounter (Signed)
-----   Message from Leone Haven, MD sent at 02/11/2014  4:52 PM EST ----- Thanks for seeing Mr Higinbotham. I am going to start him on amlodipine 10 mg daily. This has been sent to the pharmacy. Please inform him of this.   Randall Hiss ----- Message -----    From: Derl Barrow, RN    Sent: 02/11/2014   3:44 PM      To: Leone Haven, MD

## 2014-02-11 NOTE — Progress Notes (Signed)
   Pt in nurse clinic for blood pressure check.  BP 160/90 manually, heart rate 100.  Pt denies any symptoms today.  Pt advised to follow with PCP regarding BP.  Derl Barrow, RN

## 2014-02-11 NOTE — Telephone Encounter (Signed)
Pt informed of new medication being added for blood pressure amlodipine 10 mg daily.  Pt advised to follow up with PCP.  Derl Barrow, RN

## 2014-02-13 ENCOUNTER — Other Ambulatory Visit: Payer: Self-pay | Admitting: Family Medicine

## 2014-03-20 ENCOUNTER — Ambulatory Visit: Payer: Medicare Other | Admitting: Family Medicine

## 2014-04-05 ENCOUNTER — Encounter: Payer: Self-pay | Admitting: Family Medicine

## 2014-04-06 ENCOUNTER — Telehealth: Payer: Medicare Other | Admitting: Family

## 2014-04-06 DIAGNOSIS — R053 Chronic cough: Secondary | ICD-10-CM

## 2014-04-06 DIAGNOSIS — R05 Cough: Secondary | ICD-10-CM

## 2014-04-06 MED ORDER — BENZONATATE 100 MG PO CAPS: 100.0000 mg | ORAL_CAPSULE | Freq: Three times a day (TID) | ORAL | Status: DC | PRN

## 2014-04-06 NOTE — Progress Notes (Addendum)
We are sorry that you are not feeling well.  Here is how we plan to help!  Your cough may represent a more serious condition. You will need a face-to-face visit for complicated/severe symptoms which could represent a more serious condition.  There are many possible reasons for chronic (>3 weeks) cough including some of your medications, your history of being treated for chronic cough, etc. This current condition does not meet criteria for antibiotic (or anti-flu) prescribing and requires further evaluation face-to-face as mentioned above.   ADDENDUM:  Please keep your appointment as scheduled. For symptom relief, you may use A non-prescription cough medication called Robitussin DAC. Take 2 teaspoons every 8 hours or Delsym: take 2 teaspoons every 12 hours., A non-prescription cough medication called Mucinex DM: take 2 tablets every 12 hours. and A prescription cough medication called Tessalon Perles 100mg . You may take 1-2 capsules every 8 hours as needed for your cough. This has been sent to your preferred pharmacy.    If you are having a true medical emergency please call 911.  If you need an urgent face to face visit, [facility] has four urgent care centers for your convenience.  . [facility] a Provider at this Location  [address] [place], [place] [address] . 8 am to 8 pm Monday-Friday . 9 am to 7 pm Saturday-Sunday  . [facility] [place] a Provider at this Location  [address], [place], [place] [address] . 8 am to 8 pm Monday-Friday . 9 am to 6 pm Saturday . 11 am to 6 pm Sunday   . [facility] [place] Get Driving Directions  [address] [place], [place] [address] . 8 am to 8 pm Monday-Friday . 9 am to 4 pm Saturday-Sunday   . [facility] (a walk in  primary care provider)  [place] a Provider at this Location  [place], [place] [address] . 8 am to 8:30 pm Monday-Thursday . 8 am to 6 pm Friday . 8 am to 4 pm Saturday-Sunday  Your e-visit answers were reviewed by a board certified advanced clinical practitioner to complete your personal care plan.  Depending on the condition, your plan could have included both over the counter or prescription medications.  You will get an e-mail in the next two days asking about your experience.  I hope that your e-visit has been valuable and will speed your recovery . Thank you for choosing an e-visit.

## 2014-04-06 NOTE — Addendum Note (Signed)
Addended by: Benjamine Mola on: 04/06/2014 04:22 PM   Modules accepted: Orders

## 2014-04-08 ENCOUNTER — Other Ambulatory Visit: Payer: Self-pay | Admitting: Family Medicine

## 2014-04-08 MED ORDER — LISINOPRIL-HYDROCHLOROTHIAZIDE 20-12.5 MG PO TABS: 2.0000 | ORAL_TABLET | Freq: Every day | ORAL | Status: DC

## 2014-04-08 NOTE — Telephone Encounter (Signed)
Pt has an appt tomorrow. Wil Slape,CMA

## 2014-04-08 NOTE — Telephone Encounter (Signed)
Refill given. Please inform the patient that he needs to follow-up in clinic for his blood pressure in the next month. Thanks.

## 2014-04-09 ENCOUNTER — Observation Stay (HOSPITAL_COMMUNITY)
Admission: AD | Admit: 2014-04-09 | Discharge: 2014-04-10 | Disposition: A | Discharge status: Home / Self Care | Payer: Medicare Other | Source: Ambulatory Visit | Attending: Family Medicine | Admitting: Family Medicine

## 2014-04-09 ENCOUNTER — Encounter (HOSPITAL_COMMUNITY): Payer: Self-pay | Admitting: General Practice

## 2014-04-09 ENCOUNTER — Other Ambulatory Visit: Payer: Self-pay | Admitting: Family Medicine

## 2014-04-09 ENCOUNTER — Observation Stay (HOSPITAL_COMMUNITY): Payer: Medicare Other

## 2014-04-09 ENCOUNTER — Encounter: Payer: Self-pay | Admitting: Family Medicine

## 2014-04-09 ENCOUNTER — Ambulatory Visit (INDEPENDENT_AMBULATORY_CARE_PROVIDER_SITE_OTHER): Payer: Medicare Other | Admitting: Family Medicine

## 2014-04-09 VITALS — BP 170/92 | HR 112 | Temp 98.4°F | Ht 70.0 in | Wt 221.0 lb

## 2014-04-09 DIAGNOSIS — H538 Other visual disturbances: Secondary | ICD-10-CM | POA: Diagnosis not present

## 2014-04-09 DIAGNOSIS — Z720 Tobacco use: Secondary | ICD-10-CM | POA: Insufficient documentation

## 2014-04-09 DIAGNOSIS — I10 Essential (primary) hypertension: Secondary | ICD-10-CM | POA: Diagnosis not present

## 2014-04-09 DIAGNOSIS — Z881 Allergy status to other antibiotic agents status: Secondary | ICD-10-CM | POA: Diagnosis not present

## 2014-04-09 DIAGNOSIS — N529 Male erectile dysfunction, unspecified: Secondary | ICD-10-CM | POA: Insufficient documentation

## 2014-04-09 DIAGNOSIS — Z880 Allergy status to penicillin: Secondary | ICD-10-CM | POA: Diagnosis not present

## 2014-04-09 DIAGNOSIS — M19019 Primary osteoarthritis, unspecified shoulder: Secondary | ICD-10-CM | POA: Diagnosis not present

## 2014-04-09 DIAGNOSIS — Z87891 Personal history of nicotine dependence: Secondary | ICD-10-CM | POA: Insufficient documentation

## 2014-04-09 DIAGNOSIS — Z7982 Long term (current) use of aspirin: Secondary | ICD-10-CM | POA: Insufficient documentation

## 2014-04-09 DIAGNOSIS — Z888 Allergy status to other drugs, medicaments and biological substances status: Secondary | ICD-10-CM | POA: Insufficient documentation

## 2014-04-09 DIAGNOSIS — Z8673 Personal history of transient ischemic attack (TIA), and cerebral infarction without residual deficits: Secondary | ICD-10-CM | POA: Diagnosis not present

## 2014-04-09 DIAGNOSIS — E785 Hyperlipidemia, unspecified: Secondary | ICD-10-CM | POA: Insufficient documentation

## 2014-04-09 DIAGNOSIS — R05 Cough: Secondary | ICD-10-CM

## 2014-04-09 DIAGNOSIS — R059 Cough, unspecified: Secondary | ICD-10-CM

## 2014-04-09 DIAGNOSIS — G459 Transient cerebral ischemic attack, unspecified: Secondary | ICD-10-CM | POA: Diagnosis present

## 2014-04-09 DIAGNOSIS — J309 Allergic rhinitis, unspecified: Secondary | ICD-10-CM

## 2014-04-09 HISTORY — DX: Adverse effect of unspecified anesthetic, initial encounter: T41.45XA

## 2014-04-09 HISTORY — DX: Unspecified osteoarthritis, unspecified site: M19.90

## 2014-04-09 LAB — CBC
HCT: 40.2 % (ref 39.0–52.0)
HEMOGLOBIN: 13.6 g/dL (ref 13.0–17.0)
MCH: 30.2 pg (ref 26.0–34.0)
MCHC: 33.8 g/dL (ref 30.0–36.0)
MCV: 89.3 fL (ref 78.0–100.0)
PLATELETS: 321 10*3/uL (ref 150–400)
RBC: 4.5 MIL/uL (ref 4.22–5.81)
RDW: 13.6 % (ref 11.5–15.5)
WBC: 8.5 10*3/uL (ref 4.0–10.5)

## 2014-04-09 LAB — COMPREHENSIVE METABOLIC PANEL
ALT: 21 U/L (ref 0–53)
AST: 38 U/L — AB (ref 0–37)
Albumin: 3.6 g/dL (ref 3.5–5.2)
Alkaline Phosphatase: 92 U/L (ref 39–117)
Anion gap: 11 (ref 5–15)
BILIRUBIN TOTAL: 0.5 mg/dL (ref 0.3–1.2)
BUN: 6 mg/dL (ref 6–23)
CHLORIDE: 97 meq/L (ref 96–112)
CO2: 25 mmol/L (ref 19–32)
Calcium: 9.4 mg/dL (ref 8.4–10.5)
Creatinine, Ser: 0.92 mg/dL (ref 0.50–1.35)
GFR calc non Af Amer: 82 mL/min — ABNORMAL LOW (ref >90)
Glucose, Bld: 138 mg/dL — ABNORMAL HIGH (ref 70–99)
POTASSIUM: 3.3 mmol/L — AB (ref 3.5–5.1)
SODIUM: 133 mmol/L — AB (ref 135–145)
Total Protein: 7.1 g/dL (ref 6.0–8.3)

## 2014-04-09 LAB — GLUCOSE, CAPILLARY: Glucose-Capillary: 135 mg/dL — ABNORMAL HIGH (ref 70–99)

## 2014-04-09 MED ORDER — MAGNESIUM OXIDE 400 MG PO TABS: 400.0000 mg | ORAL_TABLET | Freq: Every day | ORAL | Status: DC

## 2014-04-09 MED ORDER — ACETAMINOPHEN 325 MG PO TABS
650.0000 mg | ORAL_TABLET | Freq: Four times a day (QID) | ORAL | Status: DC | PRN
  Administered 2014-04-09: 650 mg via ORAL
  Filled 2014-04-09 (×2): qty 2

## 2014-04-09 MED ORDER — LORATADINE 10 MG PO TABS
10.0000 mg | ORAL_TABLET | Freq: Every day | ORAL | Status: DC
  Administered 2014-04-09 – 2014-04-10 (×2): 10 mg via ORAL
  Filled 2014-04-09 (×2): qty 1

## 2014-04-09 MED ORDER — SODIUM CHLORIDE 0.9 % IV SOLN: 250.0000 mL | INTRAVENOUS | Status: DC | PRN

## 2014-04-09 MED ORDER — LISINOPRIL 20 MG PO TABS
40.0000 mg | ORAL_TABLET | Freq: Every day | ORAL | Status: DC
  Administered 2014-04-10: 40 mg via ORAL
  Filled 2014-04-09: qty 2

## 2014-04-09 MED ORDER — SODIUM CHLORIDE 0.9 % IJ SOLN
3.0000 mL | Freq: Two times a day (BID) | INTRAMUSCULAR | Status: DC
  Administered 2014-04-09 – 2014-04-10 (×2): 3 mL via INTRAVENOUS

## 2014-04-09 MED ORDER — MAGNESIUM OXIDE 400 (241.3 MG) MG PO TABS
400.0000 mg | ORAL_TABLET | Freq: Every day | ORAL | Status: DC
  Administered 2014-04-10: 400 mg via ORAL
  Filled 2014-04-09: qty 1

## 2014-04-09 MED ORDER — LISINOPRIL-HYDROCHLOROTHIAZIDE 20-12.5 MG PO TABS: 2.0000 | ORAL_TABLET | Freq: Every day | ORAL | Status: DC

## 2014-04-09 MED ORDER — ACETAMINOPHEN 650 MG RE SUPP: 650.0000 mg | Freq: Four times a day (QID) | RECTAL | Status: DC | PRN

## 2014-04-09 MED ORDER — SODIUM CHLORIDE 0.9 % IJ SOLN: 3.0000 mL | INTRAMUSCULAR | Status: DC | PRN

## 2014-04-09 MED ORDER — ALBUTEROL SULFATE (2.5 MG/3ML) 0.083% IN NEBU: 2.5000 mg | INHALATION_SOLUTION | Freq: Four times a day (QID) | RESPIRATORY_TRACT | Status: DC | PRN

## 2014-04-09 MED ORDER — HEPARIN SODIUM (PORCINE) 5000 UNIT/ML IJ SOLN
5000.0000 [IU] | Freq: Three times a day (TID) | INTRAMUSCULAR | Status: DC
  Administered 2014-04-09 – 2014-04-10 (×3): 5000 [IU] via SUBCUTANEOUS
  Filled 2014-04-09 (×3): qty 1

## 2014-04-09 MED ORDER — ATORVASTATIN CALCIUM 40 MG PO TABS
40.0000 mg | ORAL_TABLET | Freq: Every day | ORAL | Status: DC
  Filled 2014-04-09: qty 1

## 2014-04-09 MED ORDER — ALBUTEROL SULFATE HFA 108 (90 BASE) MCG/ACT IN AERS: 2.0000 | INHALATION_SPRAY | Freq: Four times a day (QID) | RESPIRATORY_TRACT | Status: DC | PRN

## 2014-04-09 MED ORDER — ASPIRIN EC 81 MG PO TBEC
81.0000 mg | DELAYED_RELEASE_TABLET | Freq: Every day | ORAL | Status: DC
  Administered 2014-04-09 – 2014-04-10 (×2): 81 mg via ORAL
  Filled 2014-04-09 (×2): qty 1

## 2014-04-09 MED ORDER — GUAIFENESIN ER 600 MG PO TB12
600.0000 mg | ORAL_TABLET | Freq: Two times a day (BID) | ORAL | Status: DC
Start: 2014-04-09 — End: 2014-04-10
  Administered 2014-04-09 – 2014-04-10 (×2): 600 mg via ORAL
  Filled 2014-04-09 (×2): qty 1

## 2014-04-09 MED ORDER — HYDROCHLOROTHIAZIDE 25 MG PO TABS
25.0000 mg | ORAL_TABLET | Freq: Every day | ORAL | Status: DC
  Administered 2014-04-10: 25 mg via ORAL
  Filled 2014-04-09: qty 1

## 2014-04-09 MED ORDER — BENZONATATE 100 MG PO CAPS: 100.0000 mg | ORAL_CAPSULE | Freq: Three times a day (TID) | ORAL | Status: DC | PRN

## 2014-04-09 NOTE — Patient Instructions (Addendum)
Patient admitted to the hospital for further management.

## 2014-04-09 NOTE — Progress Notes (Signed)
Patient ID: Steven Simmons, male   DOB: [birth year], 74 y.o.   MRN: [id number]  [name], MD Phone: [phone]  Steven Simmons is a 74 y.o. male who presents today for f/u.  HYPERTENSION Disease Monitoring Home BP Monitoring not checking Chest pain- no    Dyspnea- no Medications Compliance-  Taking, though is only taking lisinopril 20 mg, and maybe taking HCTZ though this is unclear.  Edema- no  Blurry vision: patient had episode of this earlier today. Woke up and his vision was blurry for 3-10 minutes. He was unable to see the computer screen. He notes after about 10 minutes his vision returned to normal. He did not note any weakness or numbness. He states he has felt fine since that time.   Cough: notes he has had a cough for the past month. Non-productive. He notes his ears are stopped up, has post-nasal drip, and congestion. No sore throat. Denies fevers and sweats. Notes cough has improved some over the past couple of days. Has not taken any medications for this.     Patient is a former smoker.    ROS: Per HPI   Physical Exam Filed Vitals:   04/09/14 1404  BP: 170/92  Pulse: 112  Temp: 98.4 F (36.9 C)    Gen: Well NAD HEENT: PERRL,  MMM Lungs: CTABL Nl WOB Heart: RRR  Neuro: CN 2-12 intact, 5/5 strength in bilateral biceps, triceps, grip, quads, hamstrings, plantar and dorsiflexion, sensation to light touch intact in bilateral UE and LE, normal gait, 2+ patellar reflexes Exts: Non edematous BL  LE, warm and well perfused.  Vision L eye 20/80 R eye 20/30 Both eyes 20/30    Assessment/Plan: Please see individual problem list.  [name], MD [facility] PGY-3

## 2014-04-09 NOTE — H&P (Signed)
[facility] Admission History and Physical Service Pager: [phone]  Patient name: Steven Simmons Medical record number: [id number] Date of birth: 07-02-1940 Age: 74 y.o. Gender: male  Primary Care Provider: [name], MD Consultants: none Code Status: full code (discussed with pt upon admission)   Chief Complaint: transient blurry vision  Assessment and Plan: Steven Simmons is a 74 y.o. male presenting with elevated BP in clinic and transient blurry vision. PMH is significant for HTN, HLD, prior TIA.  # Transient blurry vision - likely related to elevated blood pressure earlier today, however could also represent TIA given patient's history and age. Patient was admitted to the hospital from clinic out of concern for the fact that he lives at home by himself. Symptoms have now completely resolved. -Admit to the hospital telemetry unit for monitoring overnight -Check basic labs: CBC, CMET -Risk stratification: Will not check A1c, TSH, or lipid panel as these have all been done recently and were relatively unremarkable. Starting statin as below. -Aspirin 81 mg daily -Check CT head and EKG -Check visual acuity -We'll hold off on checking carotid Dopplers or echo as these were done less than 2 years ago and were normal -Blood pressure management as below  # Hypertension: Blood pressure quite elevated in clinic with systolic of over 892 initially. At this time he is normotensive for his age per JNC 8 guidelines -Continue home lisinopril-HCTZ  # Hyperlipidemia: Based on lipids drawn in October, 10 year ASCVD risk is over 30%. Patient would benefit from moderate to high intensity statin. -Start Lipitor 40 mg daily  # Cough: No abnormal findings on lung exam. May represent postnasal drip from etiology such as seasonal allergies, but could also be related to ACE inhibitor cough. Has had some nasal congestion as well. -Check chest x-ray -Start empiric  loratadine 10 mg daily and monitor for relief -Continue Tessalon Perles, guaifenesin  FEN/GI: SL IV, heart healthy diet once passes stroke swallow screen Prophylaxis: Subcutaneous heparin  Disposition: Pending eval as above, anticipate discharge to home tomorrow  History of Present Illness: Steven Simmons is a 74 y.o. male presenting with transient blurry vision.  Patient states that this morning when he awoke he had blurry vision for approximately 10 minutes. It then resolved. He came in to clinic this afternoon for routine follow-up of his hypertension, and was noted to be markedly hypertensive with a blood pressure of over 119 systolic, and over 417 diastolic initially. He states he has been compliant with his home lisinopril-HCTZ. He has had a cough for the last month or so, associated with some sinus congestion. He denies any chest pain, fevers, numbness/tingling/weakness, dizziness, or problems with oral intake. He did take some cough medicine which seems to have contained dextromethorphan. He last took a dose of this yesterday morning. Patient was sent over to the hospital for direct admission from the clinic out of concern that he might have had a TIA, and that he lives at home alone.  Review Of Systems: Per HPI otherwise 12 point review of systems was performed and was unremarkable.  Patient Active Problem List   Diagnosis Date Noted  . Cough 01/25/2014  . Erectile dysfunction 12/28/2013  . HLD (hyperlipidemia) 08/11/2012  . TIA (transient ischemic attack) 06/29/2012  . HTN (hypertension) 06/29/2012   Past Medical History: Past Medical History  Diagnosis Date  . Peptic ulcer disease   . Hypertension   . GERD (gastroesophageal reflux disease)   . History of TIA (transient ischemic attack)   .  Complication of anesthesia     " dont know the name of it ,but they gave it to me at [facility] in 1983 "   I was cold and had a head ache afterwards "  . Headache   . Arthritis      shoulders    Past Surgical History: Past Surgical History  Procedure Laterality Date  . Cholecystectomy    . Hemorrhoid surgery  1993  . Cataract extraction Bilateral   . Colonoscopy  2012 ?   Social History: History  Substance Use Topics  . Smoking status: Former Smoker    Types: Cigarettes    Quit date: 04/20/1977  . Smokeless tobacco: Never Used  . Alcohol Use: No   Additional social history: Denies tobacco, alcohol, or drug use. Remote smoking history, quit in 1981.  Please also refer to relevant sections of EMR.  Family History: Family History  Problem Relation Age of Onset  . Heart disease Mother   . Hypertension Mother   . Heart disease Father   . Hypertension Father    Allergies and Medications: Allergies  Allergen Reactions  . Ceclor [Cefaclor] Other (See Comments)    Makes my heart slow and I feel like I am fading away  . Veralipride     Gums bleed, tooth aches  . Amlodipine Other (See Comments)    Gum bleeding  . Antihistamines, Chlorpheniramine-Type Other (See Comments)    Dizziness and nose bleeds  . Beta Adrenergic Blockers Other (See Comments)    Gums bleed  . Celebrex [Celecoxib] Other (See Comments)    Stomach pain  . Ibuprofen Other (See Comments)    Stomach pain    . Nutrasweet Aspartame [Aspartame] Other (See Comments)    Memory loss  . Penicillins Other (See Comments)    Makes my heart slow and I feel like I am fading away   No current facility-administered medications on file prior to encounter.   Current Outpatient Prescriptions on File Prior to Encounter  Medication Sig Dispense Refill  . acetaminophen (TYLENOL) 500 MG tablet Take 500 mg by mouth every 6 (six) hours as needed for headache.    . albuterol (PROVENTIL HFA;VENTOLIN HFA) 108 (90 BASE) MCG/ACT inhaler Inhale 2 puffs into the lungs every 6 (six) hours as needed for wheezing or shortness of breath (cough). 1 Inhaler 2  . aspirin 81 MG chewable tablet Chew 81 mg by mouth daily  as needed (occasional use for headache).     . benzonatate (TESSALON) 100 MG capsule Take 1-2 capsules (100-200 mg total) by mouth every 8 (eight) hours as needed for cough. 30 capsule 0  . guaiFENesin-codeine 100-10 MG/5ML syrup Take 10 mLs by mouth 3 (three) times daily as needed for cough. 120 mL 0  . lisinopril-hydrochlorothiazide (PRINZIDE,ZESTORETIC) 20-12.5 MG per tablet Take 2 tablets by mouth daily. 180 tablet 0  . magnesium oxide (MAG-OX) 400 MG tablet Take 400 mg by mouth daily.    . potassium chloride SA (K-DUR,KLOR-CON) 20 MEQ tablet Take 20 mEq by mouth daily.    . tadalafil (CIALIS) 10 MG tablet Take 1 tablet (10 mg total) by mouth daily as needed for erectile dysfunction. Take 30 minutes prior to sexual activity. Do not take more than one tablet daily. 5 tablet 0    Objective: BP 148/64 mmHg  Pulse 101  Temp(Src) 98.1 F (36.7 C) (Oral)  Resp 18  Ht 5\' 10"  (1.778 m)  Wt 221 lb (100.245 kg)  BMI 31.71 kg/m2  SpO2 97% Exam: General: No acute distress, lying in bed, appears well HEENT: Normocephalic, atraumatic, moist mucous membranes, poor dentition Cardiovascular: Regular rate and rhythm, no murmurs Respiratory: Clear to auscultation bilaterally, normal respiratory effort Abdomen: Normoactive bowel sounds, soft, nontender to palpation, no organomegaly appreciated Extremities: No edema appreciated bilaterally. Skin: No rashes noted Neuro: Oriented 3, alert. Face symmetric. PEERL. Tongue protrudes midline. Extraocular movements intact. Hearing symmetric bilateral. Sensation intact to light touch over bilateral face, arms, and legs. 2+ patellar reflexes bilaterally. Full strength bilaterally with grip, elbow extension and flexion, shoulder abduction, hip flexion, ankle plantar flexion and dorsiflexion. Finger-nose-finger testing normal bilaterally. Speech normal. Cognition intact. Thought process linear and logical. Vision grossly normal.  Labs and  Imaging: pending  Leeanne Rio, MD 04/09/2014, 5:39 PM PGY-3, [facility] Intern pager: [phone], text pages welcome

## 2014-04-10 DIAGNOSIS — E785 Hyperlipidemia, unspecified: Secondary | ICD-10-CM | POA: Diagnosis not present

## 2014-04-10 DIAGNOSIS — J309 Allergic rhinitis, unspecified: Secondary | ICD-10-CM | POA: Insufficient documentation

## 2014-04-10 DIAGNOSIS — G459 Transient cerebral ischemic attack, unspecified: Secondary | ICD-10-CM | POA: Diagnosis not present

## 2014-04-10 DIAGNOSIS — H538 Other visual disturbances: Secondary | ICD-10-CM | POA: Diagnosis not present

## 2014-04-10 DIAGNOSIS — I10 Essential (primary) hypertension: Secondary | ICD-10-CM | POA: Diagnosis not present

## 2014-04-10 DIAGNOSIS — Z720 Tobacco use: Secondary | ICD-10-CM | POA: Insufficient documentation

## 2014-04-10 DIAGNOSIS — N529 Male erectile dysfunction, unspecified: Secondary | ICD-10-CM | POA: Diagnosis not present

## 2014-04-10 LAB — BASIC METABOLIC PANEL
Anion gap: 11 (ref 5–15)
BUN: 6 mg/dL (ref 6–23)
CALCIUM: 9.5 mg/dL (ref 8.4–10.5)
CO2: 28 mmol/L (ref 19–32)
Chloride: 97 mEq/L (ref 96–112)
Creatinine, Ser: 0.86 mg/dL (ref 0.50–1.35)
GFR calc Af Amer: >90 mL/min (ref >90)
GFR calc non Af Amer: 85 mL/min — ABNORMAL LOW (ref >90)
GLUCOSE: 103 mg/dL — AB (ref 70–99)
Potassium: 3.4 mmol/L — ABNORMAL LOW (ref 3.5–5.1)
Sodium: 136 mmol/L (ref 135–145)

## 2014-04-10 MED ORDER — FLUTICASONE PROPIONATE 50 MCG/ACT NA SUSP: 2.0000 | Freq: Every day | NASAL | Status: DC

## 2014-04-10 MED ORDER — SIMETHICONE 80 MG PO CHEW
80.0000 mg | CHEWABLE_TABLET | Freq: Four times a day (QID) | ORAL | Status: DC | PRN
Start: 2014-04-10 — End: 2014-04-10
  Administered 2014-04-10: 80 mg via ORAL
  Filled 2014-04-10: qty 1

## 2014-04-10 MED ORDER — POTASSIUM CHLORIDE CRYS ER 20 MEQ PO TBCR
30.0000 meq | EXTENDED_RELEASE_TABLET | Freq: Once | ORAL | Status: AC
  Administered 2014-04-10: 30 meq via ORAL
  Filled 2014-04-10 (×2): qty 1

## 2014-04-10 MED ORDER — SIMETHICONE 80 MG PO CHEW: 80.0000 mg | CHEWABLE_TABLET | Freq: Four times a day (QID) | ORAL | Status: DC | PRN

## 2014-04-10 NOTE — Progress Notes (Signed)
UR completed 

## 2014-04-10 NOTE — Assessment & Plan Note (Signed)
Patient with symptoms of allergic rhinitis. No signs of infection at this time. Discussed use of saline nasal spray. Will try flonase as well. F/u in one month for this issue.

## 2014-04-10 NOTE — Progress Notes (Signed)
EKG completed. Patient appears less agitated and more calm at this time. VSS table. Patient stated he feel much better from earlier. Offer bath. Patient refused and verbalized that he will take a shower once discharge.   Ave Filter, RN

## 2014-04-10 NOTE — Discharge Instructions (Signed)
Discharge Date: 04/10/2014  Reason for Hospitalization: Blurred vision and increased blood pressure  We admitted you to observe you for possible stroke symptoms. All your imaging and labs were normal. No sign that this was a stroke. However it is important that you monitor your blood pressures at home. We have scheduled a visit for tomorrow to go into clinic for BP recheck. You have also be scheduled to revisit you PCP for hospital follow-up. Please make these appointments.  New medications:  -Simethicone for gas.  Thank you for letting us participate in your care!

## 2014-04-10 NOTE — Discharge Summary (Signed)
[facility] Discharge Summary  Patient name: Steven Simmons Medical record number: [id number] Date of birth: 1940/09/02 Age: 74 y.o. Gender: male Date of Admission: 04/09/2014  Date of Discharge: 04/10/2013 Admitting Physician: Lind Covert, MD  Primary Care Provider: [name], MD Consultants: None  Indication for Hospitalization: Elevated Blood Pressure and blurred vision  Discharge Diagnoses/Problem List:   Patient Active Problem List   Diagnosis Date Noted  . Blurred vision   . Accelerated hypertension   . Hyperlipidemia   . Tobacco abuse   . Cough 01/25/2014  . Erectile dysfunction 12/28/2013  . HLD (hyperlipidemia) 08/11/2012  . TIA (transient ischemic attack) 06/29/2012  . HTN (hypertension) 06/29/2012   Disposition: Home  Discharge Condition: Improved  Discharge Exam:  Filed Vitals:   04/10/14 1326  BP: 175/92  Pulse: 84  Temp:   Resp: 20   General: No acute distress, walking in room, appears well HEENT: Normocephalic, atraumatic, moist mucous membranes, poor dentition Cardiovascular: Regular rate and rhythm, no murmurs Respiratory: Clear to auscultation bilaterally, normal respiratory effort Abdomen: Normoactive bowel sounds, soft, nontender to palpation, no organomegaly appreciated Extremities: No edema appreciated bilaterally. Skin: No rashes noted Neuro: No focal deficits. A&Ox3.  Brief Hospital Course:  Steven Simmons is a 74 y.o. male who presented with transient blurred vision in setting of elevated blood pressure. PMH is significant for HTN, HLD, and prior TIA.   Transient blurry vision in setting of elevated blood pressure: Blurred vision likely related to elevated blood pressure in clinic. He was admitted for concern of TIA as he has history of prior TIA. -Check basic labs: CBC nml, CMET unremarkable except for some hypokalemia for which he was given potassium. CT head with no acute intracranial abnormality,  small vessel ischemic changes noted. CXR with no active cardiopulmonary disease. EKG wuith normal sinus rhythm. No risk stratification labs completed as patient recently had done. He was given aspirin and his regular home BP medication, lisinopril-HCTZ. Blood pressure remained appropriate during admission and he denied any more blurred vision.    Issues for Follow Up:  1. Blood pressures normatensive during hospitalization. Continue to monitor and consider adjusting if continue to be elevated. 2. Patient refused Lipitor. Continue to have discussions about cholesterol medication.   Significant Procedures: None  Significant Labs and Imaging:   Recent Labs Lab 04/09/14 1908  WBC 8.5  HGB 13.6  HCT 40.2  PLT 321    Recent Labs Lab 04/09/14 1908 04/10/14 0421  NA 133* 136  K 3.3* 3.4*  CL 97 97  CO2 25 28  GLUCOSE 138* 103*  BUN 6 6  CREATININE 0.92 0.86  CALCIUM 9.4 9.5  ALKPHOS 92  --   AST 38*  --   ALT 21  --   ALBUMIN 3.6  --    Dg Chest 2 View 04/09/2014  IMPRESSION: No active cardiopulmonary disease.     Ct Head Wo Contrast 04/09/2014  IMPRESSION: 1.  No acute intracranial abnormality. 2. Advanced small vessel ischemic change.     Results/Tests Pending at Time of Discharge: None  Discharge Medications:    Medication List    TAKE these medications        acetaminophen 500 MG tablet  Commonly known as:  TYLENOL  Take 500 mg by mouth every 6 (six) hours as needed for headache.     albuterol 108 (90 BASE) MCG/ACT inhaler  Commonly known as:  PROVENTIL HFA;VENTOLIN HFA  Inhale 2 puffs into the lungs every  6 (six) hours as needed for wheezing or shortness of breath (cough).     aspirin 81 MG chewable tablet  Chew 81 mg by mouth daily as needed (occasional use for headache).     benzonatate 100 MG capsule  Commonly known as:  TESSALON  Take 1-2 capsules (100-200 mg total) by mouth every 8 (eight) hours as needed for cough.     guaiFENesin-codeine 100-10 MG/5ML  syrup  Take 10 mLs by mouth 3 (three) times daily as needed for cough.     lisinopril-hydrochlorothiazide 20-12.5 MG per tablet  Commonly known as:  PRINZIDE,ZESTORETIC  Take 2 tablets by mouth daily.     POTASSIUM PO  Take 1 tablet by mouth daily.     simethicone 80 MG chewable tablet  Commonly known as:  MYLICON  Chew 1 tablet (80 mg total) by mouth 4 (four) times daily as needed for flatulence.     tadalafil 10 MG tablet  Commonly known as:  CIALIS  Take 1 tablet (10 mg total) by mouth daily as needed for erectile dysfunction. Take 30 minutes prior to sexual activity. Do not take more than one tablet daily.        Discharge Instructions: Please refer to Patient Instructions section of EMR for full details.  Patient was counseled important signs and symptoms that should prompt return to medical care, changes in medications, dietary instructions, activity restrictions, and follow up appointments.   Follow-Up Appointments: Follow-up Information    Follow up with [name], MD On 04/22/2014.   Specialty:  Family Medicine   Why:  @11am  for hospital follow-up   Contact information:   [place] [address] [phone]       Follow up with Nurse Visit On 04/11/2014.   Why:  @3 :30p for blood pressure check   Contact information:   [place] [address] [phone]     Luiz Blare, DO 04/10/2014, 2:14 PM PGY-1, [facility]

## 2014-04-10 NOTE — Assessment & Plan Note (Signed)
Blood pressure elevated during office visit and patient with possible end organ damage with blurry vision earlier today. Patient will be admitted to the hospital for further management of this issue.

## 2014-04-10 NOTE — Progress Notes (Signed)
Discharge instruction reviewed with patient. Instructed patient to obtain his medication at pharmacy on d/c summary. All questions answered at this time. Patient waiting for transportation.  Ave Filter, RN

## 2014-04-10 NOTE — Assessment & Plan Note (Signed)
Patient with episode of blurred vision possibly related to HTN emergency vs TIA. Patient does have a prior history of TIA and has risk factors with HTN and HLD for vascular event. Given potential for this being a TIA and that he is at the most risk in the acute period for CVA he will be direct admitted to the hospital for further work up and monitoring.

## 2014-04-10 NOTE — Progress Notes (Signed)
Patient c/o that the claritin and mucinex taken this AM is making him sick since these are the two new medication he is taking while he is here. Patient described that his "belly is full of gas, burning feeling, and it is bad" and he "feels sick like I'm going to throw up". Abdomen appear soft, nondistended with hyperactive bowel sounds. LBM 04/09/14. Dr. Gerarda Fraction paged and aware of situation. Patient is request for gas, nausea, and heartburn medication. Patient is currently walking around in his room. RN informed patient that once these medications are order and verified, RN will administer.   Ave Filter, RN

## 2014-04-10 NOTE — Progress Notes (Signed)
Family Medicine Teaching Service Daily Progress Note Intern Pager: [phone]  Patient name: Steven Simmons Medical record number: [id number] Date of birth: [birth year] Age: 74 y.o. Gender: male  Primary Care Provider: [name], MD Consultants: None Code Status: Full  Pt Overview and Major Events to Date:  1/5 - Admitted  Assessment and Plan: Auren Valdes is a 75 y.o. male presenting with elevated BP in clinic and transient blurry vision. PMH is significant for HTN, HLD, prior TIA.  # Transient blurry vision - likely related to elevated blood pressure earlier today, however could also represent TIA given patient's history and age. Patient was admitted to the hospital from clinic out of concern for the fact that he lives at home by himself. Symptoms have now completely resolved. -Labs: CBC nml, CMET unremarkable except for some hypokalemia (will give one dose of K-DUR) -Risk stratification: Will not check A1c, TSH, or lipid panel as these have all been done recently and were relatively unremarkable.  -Aspirin 81 mg daily -CT head: no acute intracranial abnormality, small vessel ischemic changes noted -CXR: no active cardiopulmonary disease -EKG pending -We'll hold off on checking carotid Dopplers or echo as these were done less than 2 years ago and were normal -Blood pressure management as below  # Hypertension: Blood pressure quite elevated in clinic with systolic of over 355 initially. At this time he is normotensive for his age per JNC 8 guidelines -Continue home lisinopril-HCTZ -will supplement potassium; he receives medicine at home  # Hyperlipidemia: Based on lipids drawn in October, 10 year ASCVD risk is over 30%. Patient would benefit from moderate to high intensity statin. -Continue Lipitor 40 mg daily  # Cough: No abnormal findings on lung exam. May represent postnasal drip from etiology such as seasonal allergies, but could also be related to ACE inhibitor cough. Has  had some nasal congestion as well. -Continue empiric loratadine 10 mg daily and monitor for relief -Continue Tessalon Perles, guaifenesin  FEN/GI: regular diet Prophylaxis: Subcutaneous heparin   Disposition: Home  Subjective:  Patient feels better this morning. He complains of no symptoms. He is no longer having blurry vision. Denies CP, SOB, HA.  Objective: Temp:  [97.7 F (36.5 C)-98.4 F (36.9 C)] 98.4 F (36.9 C) (01/06 0627) Pulse Rate:  [66-112] 70 (01/06 0627) Resp:  [18] 18 (01/06 0627) BP: (117-170)/(52-92) 117/52 mmHg (01/06 0627) SpO2:  [95 %-98 %] 96 % (01/06 0627) Weight:  [221 lb (100.245 kg)] 221 lb (100.245 kg) (01/05 1606) Physical Exam: General: No acute distress, walking in room, appears well HEENT: Normocephalic, atraumatic, moist mucous membranes, poor dentition Cardiovascular: Regular rate and rhythm, no murmurs Respiratory: Clear to auscultation bilaterally, normal respiratory effort Abdomen: Normoactive bowel sounds, soft, nontender to palpation, no organomegaly appreciated Extremities: No edema appreciated bilaterally. Skin: No rashes noted Neuro: No focal deficits. A&Ox3.  Laboratory:  Recent Labs Lab 04/09/14 1908  WBC 8.5  HGB 13.6  HCT 40.2  PLT 321    Recent Labs Lab 04/09/14 1908 04/10/14 0421  NA 133* 136  K 3.3* 3.4*  CL 97 97  CO2 25 28  BUN 6 6  CREATININE 0.92 0.86  CALCIUM 9.4 9.5  PROT 7.1  --   BILITOT 0.5  --   ALKPHOS 92  --   ALT 21  --   AST 38*  --   GLUCOSE 138* 103*     Imaging/Diagnostic Tests: Dg Chest 2 View  04/09/2014   CLINICAL DATA:  Cough and congestion for 1  month. Tightness in the chest. Previous smoker.  EXAM: CHEST  2 VIEW  COMPARISON:  None.  FINDINGS: Scattered calcified granulomas in the lungs. Normal heart size and pulmonary vascularity. No focal airspace disease or consolidation in the lungs. No blunting of costophrenic angles. No pneumothorax. Mediastinal contours appear intact.  Degenerative changes in the spine.  IMPRESSION: No active cardiopulmonary disease.   Electronically Signed   By: Lucienne Capers M.D.   On: 04/09/2014 21:29   Ct Head Wo Contrast  04/09/2014   CLINICAL DATA:  High blood pressure. Maxillary sinus pain. Transient ischemic attack.  EXAM: CT HEAD WITHOUT CONTRAST  TECHNIQUE: Contiguous axial images were obtained from the base of the skull through the vertex without intravenous contrast.  COMPARISON:  09/13/2013  FINDINGS: Sinuses/Soft tissues: Clear paranasal sinuses and mastoid air cells.  Intracranial: Moderate to marked low density in the periventricular white matter likely related to small vessel disease. No mass lesion, hemorrhage, hydrocephalus, acute infarct, intra-axial, or extra-axial fluid collection.  IMPRESSION: 1.  No acute intracranial abnormality. 2. Advanced small vessel ischemic change.   Electronically Signed   By: Abigail Miyamoto M.D.   On: 04/09/2014 18:29    Katheren Shams, DO 04/10/2014, 7:41 AM PGY-1, [facility] Intern pager: [phone], text pages welcome

## 2014-04-11 ENCOUNTER — Emergency Department (HOSPITAL_COMMUNITY)
Admission: EM | Admit: 2014-04-11 | Discharge: 2014-04-11 | Disposition: A | Discharge status: Home / Self Care | Payer: Medicare Other | Attending: Emergency Medicine | Admitting: Emergency Medicine

## 2014-04-11 ENCOUNTER — Encounter: Payer: Self-pay | Admitting: Family Medicine

## 2014-04-11 ENCOUNTER — Ambulatory Visit (INDEPENDENT_AMBULATORY_CARE_PROVIDER_SITE_OTHER): Payer: Medicare Other | Admitting: *Deleted

## 2014-04-11 ENCOUNTER — Encounter (HOSPITAL_COMMUNITY): Payer: Self-pay | Admitting: Cardiology

## 2014-04-11 VITALS — BP 172/98 | HR 99

## 2014-04-11 DIAGNOSIS — I159 Secondary hypertension, unspecified: Secondary | ICD-10-CM | POA: Insufficient documentation

## 2014-04-11 DIAGNOSIS — Z7982 Long term (current) use of aspirin: Secondary | ICD-10-CM | POA: Insufficient documentation

## 2014-04-11 DIAGNOSIS — Z87891 Personal history of nicotine dependence: Secondary | ICD-10-CM | POA: Insufficient documentation

## 2014-04-11 DIAGNOSIS — Z136 Encounter for screening for cardiovascular disorders: Secondary | ICD-10-CM

## 2014-04-11 DIAGNOSIS — Z880 Allergy status to penicillin: Secondary | ICD-10-CM | POA: Insufficient documentation

## 2014-04-11 DIAGNOSIS — I10 Essential (primary) hypertension: Secondary | ICD-10-CM | POA: Diagnosis present

## 2014-04-11 DIAGNOSIS — Z8673 Personal history of transient ischemic attack (TIA), and cerebral infarction without residual deficits: Secondary | ICD-10-CM | POA: Insufficient documentation

## 2014-04-11 DIAGNOSIS — Z8719 Personal history of other diseases of the digestive system: Secondary | ICD-10-CM | POA: Diagnosis not present

## 2014-04-11 DIAGNOSIS — Z0130 Encounter for examination of blood pressure without abnormal findings: Secondary | ICD-10-CM

## 2014-04-11 DIAGNOSIS — Z8711 Personal history of peptic ulcer disease: Secondary | ICD-10-CM | POA: Insufficient documentation

## 2014-04-11 DIAGNOSIS — M1990 Unspecified osteoarthritis, unspecified site: Secondary | ICD-10-CM | POA: Diagnosis not present

## 2014-04-11 DIAGNOSIS — Z7951 Long term (current) use of inhaled steroids: Secondary | ICD-10-CM | POA: Insufficient documentation

## 2014-04-11 NOTE — Progress Notes (Signed)
   Pt in nurse clinic for blood pressure check. BP left arm 180/92 manually, heart rate 99.  Pt stated he is having some dizziness,headache and stomach upset.  Pt denies chest pain, visual changes, no numbness/tingling extremities and SOB.  Pt took lisinopril-hydrochlorothiazide this AM around 6.  Blood Pressure checked in right arm manually 172/98.  Precept with Dr. Gwendlyn Deutscher; have pt go to ED since there is no apps at [facility] for evaluation.  Pt stated understanding.  Derl Barrow, RN

## 2014-04-11 NOTE — ED Notes (Signed)
Pt reports that he was just discharged from the hospital for HTN and was told to follow up this morning. Pt reports that his BP was 180 at the office and was told to come back here for further evaluation. Pt denies any headache or blurred vision at this time

## 2014-04-11 NOTE — ED Provider Notes (Signed)
CSN: [id number]     Arrival date & time 04/11/14  1608 History   First MD Initiated Contact with Patient 04/11/14 1654     Chief Complaint  Patient presents with  . Hypertension     (Consider location/radiation/quality/duration/timing/severity/associated sxs/prior Treatment) HPI   Steven Simmons is a 74 y.o. male who is here for evaluation of high blood pressure, associated with a sensation of nausea, and headache.  He checked his blood pressure at home this morning and it was over 397 systolic.  He went to his doctor's office this afternoon for a nurse pressure check.  It was 180/92, so the responsible physician, sent him here to be evaluated for this problem.  He was discharged from the hospital yesterday after an evaluation for blurred vision.  He had screening CT, but no additional testing, for a strokelike syndrome. The  blurred vision resolved after 5 minutes.  In the hospital.  He was observed and given his usual medication, for blood pressure, which is lisinopril 40 mg and hydrochlorothiazide 25 mg daily.  He was normotensive in the hospital.  He was diagnosed with TIA and discharged.  Patient states that he has taken his usual medications today.  He denies blurred vision today.  He states he also has had some nasal congestion recently and this was treated by his doctor with an antihistamine, and a decongestant.  He does not know what these medications are.  There is no indication in Epic, about what they were.  Documentation by his primary care service indicates that he has periods of noncompliance with medicine administration. There are no other known modifying factors.   Past Medical History  Diagnosis Date  . Peptic ulcer disease   . Hypertension   . GERD (gastroesophageal reflux disease)   . History of TIA (transient ischemic attack)   . Complication of anesthesia     " dont know the name of it ,but they gave it to me at [facility] in 1983 "   I was cold and had a head ache  afterwards "  . Headache   . Arthritis     shoulders    Past Surgical History  Procedure Laterality Date  . Cholecystectomy    . Hemorrhoid surgery  1993  . Cataract extraction Bilateral   . Colonoscopy  2012 ?   Family History  Problem Relation Age of Onset  . Heart disease Mother   . Hypertension Mother   . Heart disease Father   . Hypertension Father    History  Substance Use Topics  . Smoking status: Former Smoker    Types: Cigarettes    Quit date: 04/20/1977  . Smokeless tobacco: Never Used  . Alcohol Use: No    Review of Systems  All other systems reviewed and are negative.     Allergies  Ceclor; Veralipride; Amlodipine; Antihistamines, chlorpheniramine-type; Beta adrenergic blockers; Celebrex; Ibuprofen; Lipitor; Nutrasweet aspartame; and Penicillins  Home Medications   Prior to Admission medications   Medication Sig Start Date End Date Taking? Authorizing Provider  acetaminophen (TYLENOL) 500 MG tablet Take 500 mg by mouth every 6 (six) hours as needed for headache.    Historical Provider, MD  albuterol (PROVENTIL HFA;VENTOLIN HFA) 108 (90 BASE) MCG/ACT inhaler Inhale 2 puffs into the lungs every 6 (six) hours as needed for wheezing or shortness of breath (cough). Patient not taking: Reported on 04/09/2014 01/25/14   Rosemarie Ax, MD  aspirin 81 MG chewable tablet Chew 81 mg by mouth  daily as needed (occasional use for headache).     Historical Provider, MD  benzonatate (TESSALON) 100 MG capsule Take 1-2 capsules (100-200 mg total) by mouth every 8 (eight) hours as needed for cough. 04/06/14   Benjamine Mola, FNP  fluticasone (FLONASE) 50 MCG/ACT nasal spray Place 2 sprays into both nostrils daily. 04/10/14   Leone Haven, MD  guaiFENesin-codeine 100-10 MG/5ML syrup Take 10 mLs by mouth 3 (three) times daily as needed for cough. 01/28/14   Leone Haven, MD  lisinopril-hydrochlorothiazide (PRINZIDE,ZESTORETIC) 20-12.5 MG per tablet Take 2 tablets by  mouth daily. 04/08/14   Leone Haven, MD  POTASSIUM PO Take 1 tablet by mouth daily.    Historical Provider, MD  simethicone (MYLICON) 80 MG chewable tablet Chew 1 tablet (80 mg total) by mouth 4 (four) times daily as needed for flatulence. 04/10/14   Katheren Shams, DO  tadalafil (CIALIS) 10 MG tablet Take 1 tablet (10 mg total) by mouth daily as needed for erectile dysfunction. Take 30 minutes prior to sexual activity. Do not take more than one tablet daily. 12/28/13   Leone Haven, MD   BP 155/84 mmHg  Pulse 79  Temp(Src) 98.1 F (36.7 C) (Oral)  Resp 18  Ht 5\' 9"  (1.753 m)  Wt 221 lb (100.245 kg)  BMI 32.62 kg/m2  SpO2 97% Physical Exam  Constitutional: He is oriented to person, place, and time. He appears well-developed and well-nourished. He appears distressed (Appears uncomfortable).  HENT:  Head: Normocephalic and atraumatic.  Right Ear: External ear normal.  Left Ear: External ear normal.  Eyes: Conjunctivae and EOM are normal. Pupils are equal, round, and reactive to light.  Neck: Normal range of motion and phonation normal. Neck supple.  Cardiovascular: Normal rate, regular rhythm and normal heart sounds.   Pulmonary/Chest: Effort normal and breath sounds normal. He exhibits no bony tenderness.  Abdominal: Soft. There is no tenderness.  Musculoskeletal: Normal range of motion.  Neurological: He is alert and oriented to person, place, and time. No cranial nerve deficit or sensory deficit. He exhibits normal muscle tone. Coordination normal.  No dysarthria and aphasia or nystagmus.  No ataxia.  Romberg's negative.  Skin: Skin is warm, dry and intact.  Psychiatric: His behavior is normal. Judgment and thought content normal.  He is anxious, and exhibits pressured speech.  Nursing note and vitals reviewed.   ED Course  Procedures (including critical care time)  Medications - No data to display  Patient Vitals for the past 24 hrs:  BP Temp Temp src Pulse Resp SpO2  Height Weight  04/11/14 1815 155/84 mmHg - - 79 18 97 % - -  04/11/14 1800 140/76 mmHg - - 70 - 98 % - -  04/11/14 1745 144/69 mmHg - - 69 - 96 % - -  04/11/14 1730 130/69 mmHg - - 78 - 97 % - -  04/11/14 1715 162/84 mmHg - - 85 - 97 % - -  04/11/14 1700 122/72 mmHg - - 83 - 97 % - -  04/11/14 1621 160/91 mmHg 98.1 F (36.7 C) Oral 100 18 96 % 5\' 9"  (1.753 m) 221 lb (100.245 kg)    6:30 PM Reevaluation with update and discussion. After initial assessment and treatment, an updated evaluation reveals he remains comfortable with, repeat blood pressure reassuring.  Findings discussed with the patient, all questions answered. Lonnette Shrode L    Vitals with Age-Percentiles 04/11/2014 04/11/2014 04/11/2014 04/11/2014  Length 175.3 cm  Systolic 599 357 017 793  Diastolic 91 98 92 92  Pulse 100  99 99  Respiration 18     Weight 100.245 kg      Vitals with Age-Percentiles 04/10/2014 04/10/2014 04/10/2014 04/10/2014 04/10/2014  Length       Systolic 903 009 233 007 622  Diastolic 83 92 633 98 86  Pulse 79 84  104 87  Respiration 18 20  20 18   Weight        Vitals with Age-Percentiles 06/07/4560  Length   Systolic 563  Diastolic 84  Pulse 70  Respiration   Weight             Labs Review Labs Reviewed - No data to display  Imaging Review Dg Chest 2 View  04/09/2014   CLINICAL DATA:  Cough and congestion for 1 month. Tightness in the chest. Previous smoker.  EXAM: CHEST  2 VIEW  COMPARISON:  None.  FINDINGS: Scattered calcified granulomas in the lungs. Normal heart size and pulmonary vascularity. No focal airspace disease or consolidation in the lungs. No blunting of costophrenic angles. No pneumothorax. Mediastinal contours appear intact. Degenerative changes in the spine.  IMPRESSION: No active cardiopulmonary disease.   Electronically Signed   By: Lucienne Capers M.D.   On: 04/09/2014 21:29     EKG Interpretation None      MDM   Final diagnoses:  Secondary hypertension, unspecified     Hypertension, improved with observation only.  It is within the range of his pressures, yesterday while he was hospitalized.  I discussed with the patient, the way he is taking his blood pressure medicine.  He states that his physician told him to take lisinopril 20 mg with 12.5  mg under hydrochlorothiazide, twice a day.  This was done because of persistently high blood pressure.  This is not evident from the office record, however.  While this is unusual, it may serve him well.  At this time he is asymptomatic, and does not require further evaluation for high blood pressure or any neurologic problem.  I doubt hypertensive urgency, CVA, or TIA.   Nursing Notes Reviewed/ Care Coordinated Applicable Imaging Reviewed Interpretation of Laboratory Data incorporated into ED treatment  The patient appears reasonably screened and/or stabilized for discharge and I doubt any other medical condition or other [facility] requiring further screening, evaluation, or treatment in the ED at this time prior to discharge.  Plan: Home Medications- usual; Home Treatments- rest; return here if the recommended treatment, does not improve the symptoms; Recommended follow up- PCP prn   Richarda Blade, MD 04/11/14 [phone]

## 2014-04-11 NOTE — Discharge Instructions (Signed)
Hypertension °Hypertension, commonly called high blood pressure, is when the force of blood pumping through your arteries is too strong. Your arteries are the blood vessels that carry blood from your heart throughout your body. A blood pressure reading consists of a higher number over a lower number, such as 110/72. The higher number (systolic) is the pressure inside your arteries when your heart pumps. The lower number (diastolic) is the pressure inside your arteries when your heart relaxes. Ideally you want your blood pressure below 120/80. °Hypertension forces your heart to work harder to pump blood. Your arteries may become narrow or stiff. Having hypertension puts you at risk for heart disease, stroke, and other problems.  °RISK FACTORS °Some risk factors for high blood pressure are controllable. Others are not.  °Risk factors you cannot control include:  °· Race. You may be at higher risk if you are African American. °· Age. Risk increases with age. °· Gender. Men are at higher risk than women before age 45 years. After age 65, women are at higher risk than men. °Risk factors you can control include: °· Not getting enough exercise or physical activity. °· Being overweight. °· Getting too much fat, sugar, calories, or salt in your diet. °· Drinking too much alcohol. °SIGNS AND SYMPTOMS °Hypertension does not usually cause signs or symptoms. Extremely high blood pressure (hypertensive crisis) may cause headache, anxiety, shortness of breath, and nosebleed. °DIAGNOSIS  °To check if you have hypertension, your health care provider will measure your blood pressure while you are seated, with your arm held at the level of your heart. It should be measured at least twice using the same arm. Certain conditions can cause a difference in blood pressure between your right and left arms. A blood pressure reading that is higher than normal on one occasion does not mean that you need treatment. If one blood pressure reading  is high, ask your health care provider about having it checked again. °TREATMENT  °Treating high blood pressure includes making lifestyle changes and possibly taking medicine. Living a healthy lifestyle can help lower high blood pressure. You may need to change some of your habits. °Lifestyle changes may include: °· Following the DASH diet. This diet is high in fruits, vegetables, and whole grains. It is low in salt, red meat, and added sugars. °· Getting at least 2½ hours of brisk physical activity every week. °· Losing weight if necessary. °· Not smoking. °· Limiting alcoholic beverages. °· Learning ways to reduce stress. ° If lifestyle changes are not enough to get your blood pressure under control, your health care provider may prescribe medicine. You may need to take more than one. Work closely with your health care provider to understand the risks and benefits. °HOME CARE INSTRUCTIONS °· Have your blood pressure rechecked as directed by your health care provider.   °· Take medicines only as directed by your health care provider. Follow the directions carefully. Blood pressure medicines must be taken as prescribed. The medicine does not work as well when you skip doses. Skipping doses also puts you at risk for problems.   °· Do not smoke.   °· Monitor your blood pressure at home as directed by your health care provider.  °SEEK MEDICAL CARE IF:  °· You think you are having a reaction to medicines taken. °· You have recurrent headaches or feel dizzy. °· You have swelling in your ankles. °· You have trouble with your vision. °SEEK IMMEDIATE MEDICAL CARE IF: °· You develop a severe headache or confusion. °·   You have unusual weakness, numbness, or feel faint.  You have severe chest or abdominal pain.  You vomit repeatedly.  You have trouble breathing. MAKE SURE YOU:   Understand these instructions.  Will watch your condition.  Will get help right away if you are not doing well or get worse. Document  Released: 03/22/2005 Document Revised: 08/06/2013 Document Reviewed: 01/12/2013 [facility] Patient Information 2015 [place], [place]. This information is not intended to replace advice given to you by your health care provider. Make sure you discuss any questions you have with your health care provider.  How to Take Your Blood Pressure HOW DO I GET A BLOOD PRESSURE MACHINE?  You can buy an electronic home blood pressure machine at your local pharmacy. Insurance will sometimes cover the cost if you have a prescription.  Ask your doctor what type of machine is best for you. There are different machines for your arm and your wrist.  If you decide to buy a machine to check your blood pressure on your arm, first check the size of your arm so you can buy the right size cuff. To check the size of your arm:   Use a measuring tape that shows both inches and centimeters.   Wrap the measuring tape around the upper-middle part of your arm. You may need someone to help you measure.   Write down your arm measurement in both inches and centimeters.   To measure your blood pressure correctly, it is important to have the right size cuff.   If your arm is up to 13 inches (up to 34 centimeters), get an adult cuff size.  If your arm is 13 to 17 inches (35 to 44 centimeters), get a large adult cuff size.    If your arm is 17 to 20 inches (45 to 52 centimeters), get an adult thigh cuff.  WHAT DO THE NUMBERS MEAN?   There are two numbers that make up your blood pressure. For example: 120/80.  The first number (120 in our example) is called the "systolic pressure." It is a measure of the pressure in your blood vessels when your heart is pumping blood.  The second number (80 in our example) is called the "diastolic pressure." It is a measure of the pressure in your blood vessels when your heart is resting between beats.  Your doctor will tell you what your blood pressure should be. WHAT SHOULD I DO  BEFORE I CHECK MY BLOOD PRESSURE?   Try to rest or relax for at least 30 minutes before you check your blood pressure.  Do not smoke.  Do not have any drinks with caffeine, such as:  Soda.  Coffee.  Tea.  Check your blood pressure in a quiet room.  Sit down and stretch out your arm on a table. Keep your arm at about the level of your heart. Let your arm relax.  Make sure that your legs are not crossed. HOW DO I CHECK MY BLOOD PRESSURE?  Follow the directions that came with your machine.  Make sure you remove any tight-fighting clothing from your arm or wrist. Wrap the cuff around your upper arm or wrist. You should be able to fit a finger between the cuff and your arm. If you cannot fit a finger between the cuff and your arm, it is too tight and should be removed and rewrapped.  Some units require you to manually pump up the arm cuff.  Automatic units inflate the cuff when you press a  button.  Cuff deflation is automatic in both models.  After the cuff is inflated, the unit measures your blood pressure and pulse. The readings are shown on a monitor. Hold still and breathe normally while the cuff is inflated.  Getting a reading takes less than a minute.  Some models store readings in a memory. Some provide a printout of readings. If your machine does not store your readings, keep a written record.  Take readings with you to your next visit with your doctor. Document Released: 03/04/2008 Document Revised: 08/06/2013 Document Reviewed: 05/17/2013 [facility] Patient Information 2015 [place], [place]. This information is not intended to replace advice given to you by your health care provider. Make sure you discuss any questions you have with your health care provider.

## 2014-04-12 ENCOUNTER — Encounter: Payer: Self-pay | Admitting: Family Medicine

## 2014-04-12 ENCOUNTER — Telehealth: Payer: Self-pay | Admitting: Family Medicine

## 2014-04-12 MED ORDER — LISINOPRIL-HYDROCHLOROTHIAZIDE 20-12.5 MG PO TABS: 2.0000 | ORAL_TABLET | Freq: Every day | ORAL | Status: DC

## 2014-04-12 MED ORDER — LOVASTATIN 40 MG PO TABS: 40.0000 mg | ORAL_TABLET | Freq: Every day | ORAL | Status: DC

## 2014-04-12 NOTE — Telephone Encounter (Signed)
Would like his RX for lisionpril resent to pharmacy Just took one and it is burning his stomach Please send to [facility] on [address]  Would like to be called when this is done

## 2014-04-12 NOTE — Telephone Encounter (Signed)
Lisinopril possibly made stomach burn. Had issues with this yesterday. Would like to have his prescription resent to pharmacy to try a different batch. Also wants previously tried lovastatin sent to [facility].

## 2014-04-22 ENCOUNTER — Inpatient Hospital Stay: Payer: Medicare Other | Admitting: Family Medicine

## 2014-10-16 ENCOUNTER — Encounter: Payer: Self-pay | Admitting: Internal Medicine

## 2014-12-10 ENCOUNTER — Encounter: Payer: Self-pay | Admitting: Internal Medicine

## 2015-02-24 ENCOUNTER — Other Ambulatory Visit: Payer: Self-pay | Admitting: *Deleted

## 2015-02-25 MED ORDER — LISINOPRIL-HYDROCHLOROTHIAZIDE 20-12.5 MG PO TABS
2.0000 | ORAL_TABLET | Freq: Every day | ORAL | Status: DC
Start: 2015-02-25 — End: 2015-08-04

## 2015-07-05 DIAGNOSIS — Z8673 Personal history of transient ischemic attack (TIA), and cerebral infarction without residual deficits: Secondary | ICD-10-CM

## 2015-07-05 HISTORY — DX: Personal history of transient ischemic attack (TIA), and cerebral infarction without residual deficits: Z86.73

## 2015-07-28 ENCOUNTER — Emergency Department (HOSPITAL_COMMUNITY): Payer: Medicare Other

## 2015-07-28 ENCOUNTER — Observation Stay (HOSPITAL_COMMUNITY): Payer: Medicare Other

## 2015-07-28 ENCOUNTER — Observation Stay (HOSPITAL_COMMUNITY)
Admission: EM | Admit: 2015-07-28 | Discharge: 2015-07-28 | Disposition: A | Discharge status: Home / Self Care | Payer: Medicare Other | Attending: Family Medicine | Admitting: Family Medicine

## 2015-07-28 ENCOUNTER — Encounter (HOSPITAL_COMMUNITY): Payer: Self-pay | Admitting: Emergency Medicine

## 2015-07-28 DIAGNOSIS — I672 Cerebral atherosclerosis: Secondary | ICD-10-CM | POA: Diagnosis not present

## 2015-07-28 DIAGNOSIS — Z87891 Personal history of nicotine dependence: Secondary | ICD-10-CM | POA: Insufficient documentation

## 2015-07-28 DIAGNOSIS — J309 Allergic rhinitis, unspecified: Secondary | ICD-10-CM | POA: Diagnosis not present

## 2015-07-28 DIAGNOSIS — Z79899 Other long term (current) drug therapy: Secondary | ICD-10-CM | POA: Diagnosis not present

## 2015-07-28 DIAGNOSIS — G459 Transient cerebral ischemic attack, unspecified: Secondary | ICD-10-CM | POA: Diagnosis not present

## 2015-07-28 DIAGNOSIS — I674 Hypertensive encephalopathy: Principal | ICD-10-CM | POA: Insufficient documentation

## 2015-07-28 DIAGNOSIS — N529 Male erectile dysfunction, unspecified: Secondary | ICD-10-CM | POA: Diagnosis not present

## 2015-07-28 DIAGNOSIS — R51 Headache: Secondary | ICD-10-CM | POA: Diagnosis not present

## 2015-07-28 DIAGNOSIS — I10 Essential (primary) hypertension: Secondary | ICD-10-CM | POA: Diagnosis present

## 2015-07-28 DIAGNOSIS — R279 Unspecified lack of coordination: Secondary | ICD-10-CM | POA: Diagnosis present

## 2015-07-28 DIAGNOSIS — H5712 Ocular pain, left eye: Secondary | ICD-10-CM | POA: Diagnosis not present

## 2015-07-28 DIAGNOSIS — K219 Gastro-esophageal reflux disease without esophagitis: Secondary | ICD-10-CM | POA: Diagnosis not present

## 2015-07-28 DIAGNOSIS — Z7982 Long term (current) use of aspirin: Secondary | ICD-10-CM | POA: Diagnosis not present

## 2015-07-28 DIAGNOSIS — Z8673 Personal history of transient ischemic attack (TIA), and cerebral infarction without residual deficits: Secondary | ICD-10-CM | POA: Diagnosis not present

## 2015-07-28 DIAGNOSIS — Z9114 Patient's other noncompliance with medication regimen: Secondary | ICD-10-CM | POA: Diagnosis not present

## 2015-07-28 DIAGNOSIS — E785 Hyperlipidemia, unspecified: Secondary | ICD-10-CM | POA: Insufficient documentation

## 2015-07-28 LAB — CBC
HEMATOCRIT: 44.3 % (ref 39.0–52.0)
HEMATOCRIT: 41.3 % (ref 39.0–52.0)
HEMOGLOBIN: 14.2 g/dL (ref 13.0–17.0)
Hemoglobin: 13.7 g/dL (ref 13.0–17.0)
MCH: 28.8 pg (ref 26.0–34.0)
MCH: 29.6 pg (ref 26.0–34.0)
MCHC: 32.1 g/dL (ref 30.0–36.0)
MCHC: 33.2 g/dL (ref 30.0–36.0)
MCV: 89.9 fL (ref 78.0–100.0)
MCV: 89.2 fL (ref 78.0–100.0)
Platelets: 319 10*3/uL (ref 150–400)
Platelets: 287 10*3/uL (ref 150–400)
RBC: 4.93 MIL/uL (ref 4.22–5.81)
RBC: 4.63 MIL/uL (ref 4.22–5.81)
RDW: 14.1 % (ref 11.5–15.5)
RDW: 14 % (ref 11.5–15.5)
WBC: 10.5 10*3/uL (ref 4.0–10.5)
WBC: 8.9 10*3/uL (ref 4.0–10.5)

## 2015-07-28 LAB — CBG MONITORING, ED: GLUCOSE-CAPILLARY: 133 mg/dL — AB (ref 65–99)

## 2015-07-28 LAB — CREATININE, SERUM: Creatinine, Ser: 0.86 mg/dL (ref 0.61–1.24)

## 2015-07-28 LAB — I-STAT CHEM 8, ED
BUN: 16 mg/dL (ref 6–20)
CALCIUM ION: 1.14 mmol/L (ref 1.13–1.30)
Chloride: 102 mmol/L (ref 101–111)
Creatinine, Ser: 0.9 mg/dL (ref 0.61–1.24)
Glucose, Bld: 129 mg/dL — ABNORMAL HIGH (ref 65–99)
HCT: 47 % (ref 39.0–52.0)
Hemoglobin: 16 g/dL (ref 13.0–17.0)
Potassium: 3.9 mmol/L (ref 3.5–5.1)
SODIUM: 141 mmol/L (ref 135–145)
TCO2: 26 mmol/L (ref 0–100)

## 2015-07-28 LAB — GLUCOSE, CAPILLARY: Glucose-Capillary: 147 mg/dL — ABNORMAL HIGH (ref 65–99)

## 2015-07-28 LAB — DIFFERENTIAL
Basophils Absolute: 0 10*3/uL (ref 0.0–0.1)
Basophils Relative: 0 %
EOS ABS: 0.1 10*3/uL (ref 0.0–0.7)
EOS PCT: 1 %
LYMPHS ABS: 2.2 10*3/uL (ref 0.7–4.0)
Lymphocytes Relative: 21 %
Monocytes Absolute: 0.5 10*3/uL (ref 0.1–1.0)
Monocytes Relative: 5 %
Neutro Abs: 7.6 10*3/uL (ref 1.7–7.7)
Neutrophils Relative %: 73 %

## 2015-07-28 LAB — COMPREHENSIVE METABOLIC PANEL
ALT: 16 U/L — ABNORMAL LOW (ref 17–63)
ANION GAP: 10 (ref 5–15)
AST: 20 U/L (ref 15–41)
Albumin: 3.9 g/dL (ref 3.5–5.0)
Alkaline Phosphatase: 99 U/L (ref 38–126)
BILIRUBIN TOTAL: 0.6 mg/dL (ref 0.3–1.2)
BUN: 14 mg/dL (ref 6–20)
CO2: 25 mmol/L (ref 22–32)
Calcium: 9.4 mg/dL (ref 8.9–10.3)
Chloride: 103 mmol/L (ref 101–111)
Creatinine, Ser: 0.87 mg/dL (ref 0.61–1.24)
GFR calc Af Amer: >60 mL/min (ref >60)
GLUCOSE: 134 mg/dL — AB (ref 65–99)
POTASSIUM: 3.8 mmol/L (ref 3.5–5.1)
Sodium: 138 mmol/L (ref 135–145)
TOTAL PROTEIN: 7.9 g/dL (ref 6.5–8.1)

## 2015-07-28 LAB — I-STAT TROPONIN, ED: TROPONIN I, POC: 0.01 ng/mL (ref 0.00–0.08)

## 2015-07-28 LAB — PROTIME-INR
INR: 1.02 (ref 0.00–1.49)
Prothrombin Time: 13.6 seconds (ref 11.6–15.2)

## 2015-07-28 LAB — TSH: TSH: 2.85 u[IU]/mL (ref 0.350–4.500)

## 2015-07-28 LAB — APTT: aPTT: 28 seconds (ref 24–37)

## 2015-07-28 LAB — LIPID PANEL
CHOLESTEROL: 190 mg/dL (ref 0–200)
HDL: 38 mg/dL — ABNORMAL LOW (ref >40)
LDL Cholesterol: 124 mg/dL — ABNORMAL HIGH (ref 0–99)
Total CHOL/HDL Ratio: 5 RATIO
Triglycerides: 140 mg/dL (ref <150)
VLDL: 28 mg/dL (ref 0–40)

## 2015-07-28 LAB — MRSA PCR SCREENING: MRSA by PCR: NEGATIVE

## 2015-07-28 MED ORDER — METOCLOPRAMIDE HCL 5 MG/ML IJ SOLN
10.0000 mg | Freq: Once | INTRAMUSCULAR | Status: AC
  Administered 2015-07-28: 10 mg via INTRAVENOUS
  Filled 2015-07-28: qty 2

## 2015-07-28 MED ORDER — HYDRALAZINE HCL 20 MG/ML IJ SOLN
10.0000 mg | Freq: Once | INTRAMUSCULAR | Status: AC
  Administered 2015-07-28: 10 mg via INTRAVENOUS
  Filled 2015-07-28: qty 1

## 2015-07-28 MED ORDER — LISINOPRIL-HYDROCHLOROTHIAZIDE 20-12.5 MG PO TABS: 2.0000 | ORAL_TABLET | Freq: Every day | ORAL | Status: DC

## 2015-07-28 MED ORDER — ACETAMINOPHEN 650 MG RE SUPP: 650.0000 mg | Freq: Four times a day (QID) | RECTAL | Status: DC | PRN

## 2015-07-28 MED ORDER — LORAZEPAM 2 MG/ML IJ SOLN
1.0000 mg | Freq: Once | INTRAMUSCULAR | Status: AC
  Administered 2015-07-28: 1 mg via INTRAVENOUS
  Filled 2015-07-28: qty 1

## 2015-07-28 MED ORDER — ATORVASTATIN CALCIUM 40 MG PO TABS: 40.0000 mg | ORAL_TABLET | Freq: Every day | ORAL | Status: DC

## 2015-07-28 MED ORDER — LISINOPRIL 20 MG PO TABS
40.0000 mg | ORAL_TABLET | Freq: Every day | ORAL | Status: DC
  Administered 2015-07-28: 40 mg via ORAL
  Filled 2015-07-28 (×2): qty 2

## 2015-07-28 MED ORDER — FLUTICASONE PROPIONATE 50 MCG/ACT NA SUSP
2.0000 | Freq: Every day | NASAL | Status: DC
  Filled 2015-07-28: qty 16

## 2015-07-28 MED ORDER — ACETAMINOPHEN 325 MG PO TABS: 650.0000 mg | ORAL_TABLET | Freq: Four times a day (QID) | ORAL | Status: DC | PRN

## 2015-07-28 MED ORDER — SODIUM CHLORIDE 0.9% FLUSH
3.0000 mL | Freq: Two times a day (BID) | INTRAVENOUS | Status: DC
  Administered 2015-07-28: 3 mL via INTRAVENOUS

## 2015-07-28 MED ORDER — ASPIRIN 81 MG PO CHEW: 81.0000 mg | CHEWABLE_TABLET | Freq: Every day | ORAL | Status: DC | PRN

## 2015-07-28 MED ORDER — HYDROCHLOROTHIAZIDE 25 MG PO TABS
25.0000 mg | ORAL_TABLET | Freq: Every day | ORAL | Status: DC
  Administered 2015-07-28: 25 mg via ORAL
  Filled 2015-07-28: qty 1

## 2015-07-28 MED ORDER — ENOXAPARIN SODIUM 40 MG/0.4ML ~~LOC~~ SOLN
40.0000 mg | SUBCUTANEOUS | Status: DC
  Administered 2015-07-28: 40 mg via SUBCUTANEOUS
  Filled 2015-07-28: qty 0.4

## 2015-07-28 NOTE — Progress Notes (Signed)
Pt tx report received from [place] at 1140 and pt arrived to the unit at 1300. Pt A&O x4; pt oriented to the unit and room; telemetry applied and verified with NT to CCMD; VSS; Skin intact with no pressure ulcer noted. Pt in bed with call light within reach. Will closely monitor. Delia Heady RN

## 2015-07-28 NOTE — ED Provider Notes (Signed)
CSN: [id number]     Arrival date & time 07/28/15  0356 History   First MD Initiated Contact with Patient 07/28/15 [phone]     Chief Complaint: Loss of coordination  (Consider location/radiation/quality/duration/timing/severity/associated sxs/prior Treatment) The history is provided by the patient.  75 year old male with a history of hypertension and TIA states that he was working on his computer at about 2 AM when he suddenly was unable to make his hands to what he wanted them to do. He also was unable to enter a text message on his cell phone. He also noted some pain behind his left eye and some blurring of his vision. He lay down to go to sleep and noted that symptoms were somewhat improved when he woke up at were still there. This is very different from the symptoms he had with his TIA which was about 2 years ago. He denies chest pain and denies nausea or vomiting. He has not noticed any weakness or numbness.  Past Medical History  Diagnosis Date  . Peptic ulcer disease   . Hypertension   . GERD (gastroesophageal reflux disease)   . History of TIA (transient ischemic attack)   . Complication of anesthesia     " dont know the name of it ,but they gave it to me at [facility] in 1983 "   I was cold and had a head ache afterwards "  . Headache   . Arthritis     shoulders    Past Surgical History  Procedure Laterality Date  . Cholecystectomy    . Hemorrhoid surgery  1993  . Cataract extraction Bilateral   . Colonoscopy  2012 ?   Family History  Problem Relation Age of Onset  . Heart disease Mother   . Hypertension Mother   . Heart disease Father   . Hypertension Father    Social History  Substance Use Topics  . Smoking status: Former Smoker    Types: Cigarettes    Quit date: 04/20/1977  . Smokeless tobacco: Never Used  . Alcohol Use: No    Review of Systems  All other systems reviewed and are negative.     Allergies  Ceclor; Veralipride; Amlodipine; Antihistamines,  chlorpheniramine-type; Beta adrenergic blockers; Celebrex; Ibuprofen; Lipitor; Nutrasweet aspartame; and Penicillins  Home Medications   Prior to Admission medications   Medication Sig Start Date End Date Taking? Authorizing Provider  acetaminophen (TYLENOL) 500 MG tablet Take 500 mg by mouth every 6 (six) hours as needed for headache.    Historical Provider, MD  albuterol (PROVENTIL HFA;VENTOLIN HFA) 108 (90 BASE) MCG/ACT inhaler Inhale 2 puffs into the lungs every 6 (six) hours as needed for wheezing or shortness of breath (cough). Patient not taking: Reported on 04/09/2014 01/25/14   Rosemarie Ax, MD  aspirin 81 MG chewable tablet Chew 81 mg by mouth daily as needed (occasional use for headache).     Historical Provider, MD  benzonatate (TESSALON) 100 MG capsule Take 1-2 capsules (100-200 mg total) by mouth every 8 (eight) hours as needed for cough. 04/06/14   Benjamine Mola, FNP  fluticasone (FLONASE) 50 MCG/ACT nasal spray Place 2 sprays into both nostrils daily. 04/10/14   Leone Haven, MD  guaiFENesin-codeine 100-10 MG/5ML syrup Take 10 mLs by mouth 3 (three) times daily as needed for cough. 01/28/14   Leone Haven, MD  lisinopril-hydrochlorothiazide (PRINZIDE,ZESTORETIC) 20-12.5 MG tablet Take 2 tablets by mouth daily. 02/25/15   Aquilla Hacker, MD  lovastatin (MEVACOR) 40  MG tablet Take 1 tablet (40 mg total) by mouth at bedtime. 04/12/14   Leone Haven, MD  POTASSIUM PO Take 1 tablet by mouth daily.    Historical Provider, MD  simethicone (MYLICON) 80 MG chewable tablet Chew 1 tablet (80 mg total) by mouth 4 (four) times daily as needed for flatulence. 04/10/14   Katheren Shams, DO  tadalafil (CIALIS) 10 MG tablet Take 1 tablet (10 mg total) by mouth daily as needed for erectile dysfunction. Take 30 minutes prior to sexual activity. Do not take more than one tablet daily. 12/28/13   Leone Haven, MD   BP 222/107 mmHg  Pulse 101  Temp(Src) 98.4 F (36.9 C) (Oral)  Resp  20  SpO2 99% Physical Exam  Constitutional: He is oriented to person, place, and time.  HENT:  Head: Normocephalic and atraumatic.  Eyes: EOM are normal. Pupils are equal, round, and reactive to light.  Neck: Normal range of motion. Neck supple. No JVD present.  No carotid bruit  Cardiovascular: Normal rate, regular rhythm and normal heart sounds.   No murmur heard. Pulmonary/Chest: Effort normal and breath sounds normal. He has no wheezes. He has no rales. He exhibits no tenderness.  Abdominal: Soft. Bowel sounds are normal. He exhibits no distension and no mass. There is no tenderness.  Musculoskeletal: Normal range of motion. He exhibits no edema.  Lymphadenopathy:    He has no cervical adenopathy.  Neurological: He is alert and oriented to person, place, and time. No cranial nerve deficit. He exhibits normal muscle tone. Coordination normal.  Skin: Skin is warm and dry. No rash noted.  Psychiatric: He has a normal mood and affect.  Nursing note and vitals reviewed.  75 year old male, resting comfortably and in no acute distress. Vital signs are significant for hypertension and borderline tachycardia. Oxygen saturation is 99%, which is normal. Head is normocephalic and atraumatic. PERRLA, EOMI. Oropharynx is clear. Neck is nontender and supple without adenopathy or JVD. Back is nontender and there is no CVA tenderness. Lungs are clear without rales, wheezes, or rhonchi. Chest is nontender. Heart has regular rate and rhythm without murmur. Abdomen is soft, flat, nontender without masses or hepatosplenomegaly and peristalsis is normoactive. Extremities have no cyanosis or edema, full range of motion is present. Skin is warm and dry without rash. Neurologic: Mental status is normal, cranial nerves are intact, there are no motor or sensory deficits. Speech is normal. There is no pronator drift. Muscle strength is 5/5 and all tested muscle groups. Finger to nose testing is normal.  ED  Course  Procedures (including critical care time) Labs Review Results for orders placed or performed during the hospital encounter of 07/28/15  Protime-INR  Result Value Ref Range   Prothrombin Time 13.6 11.6 - 15.2 seconds   INR 1.02 0.00 - 1.49  APTT  Result Value Ref Range   aPTT 28 24 - 37 seconds  CBC  Result Value Ref Range   WBC 10.5 4.0 - 10.5 K/uL   RBC 4.93 4.22 - 5.81 MIL/uL   Hemoglobin 14.2 13.0 - 17.0 g/dL   HCT 44.3 39.0 - 52.0 %   MCV 89.9 78.0 - 100.0 fL   MCH 28.8 26.0 - 34.0 pg   MCHC 32.1 30.0 - 36.0 g/dL   RDW 14.1 11.5 - 15.5 %   Platelets 319 150 - 400 K/uL  Differential  Result Value Ref Range   Neutrophils Relative % 73 %   Neutro Abs  7.6 1.7 - 7.7 K/uL   Lymphocytes Relative 21 %   Lymphs Abs 2.2 0.7 - 4.0 K/uL   Monocytes Relative 5 %   Monocytes Absolute 0.5 0.1 - 1.0 K/uL   Eosinophils Relative 1 %   Eosinophils Absolute 0.1 0.0 - 0.7 K/uL   Basophils Relative 0 %   Basophils Absolute 0.0 0.0 - 0.1 K/uL  Comprehensive metabolic panel  Result Value Ref Range   Sodium 138 135 - 145 mmol/L   Potassium 3.8 3.5 - 5.1 mmol/L   Chloride 103 101 - 111 mmol/L   CO2 25 22 - 32 mmol/L   Glucose, Bld 134 (H) 65 - 99 mg/dL   BUN 14 6 - 20 mg/dL   Creatinine, Ser 0.87 0.61 - 1.24 mg/dL   Calcium 9.4 8.9 - 10.3 mg/dL   Total Protein 7.9 6.5 - 8.1 g/dL   Albumin 3.9 3.5 - 5.0 g/dL   AST 20 15 - 41 U/L   ALT 16 (L) 17 - 63 U/L   Alkaline Phosphatase 99 38 - 126 U/L   Total Bilirubin 0.6 0.3 - 1.2 mg/dL   GFR calc non Af Amer >60 >60 mL/min   GFR calc Af Amer >60 >60 mL/min   Anion gap 10 5 - 15  I-stat troponin, ED (not at [facility])  Result Value Ref Range   Troponin i, poc 0.01 0.00 - 0.08 ng/mL   Comment 3          CBG monitoring, ED  Result Value Ref Range   Glucose-Capillary 133 (H) 65 - 99 mg/dL  I-Stat Chem 8, ED  (not at [facility], [facility])  Result Value Ref Range   Sodium 141 135 - 145 mmol/L   Potassium 3.9 3.5 - 5.1 mmol/L   Chloride 102  101 - 111 mmol/L   BUN 16 6 - 20 mg/dL   Creatinine, Ser 0.90 0.61 - 1.24 mg/dL   Glucose, Bld 129 (H) 65 - 99 mg/dL   Calcium, Ion 1.14 1.13 - 1.30 mmol/L   TCO2 26 0 - 100 mmol/L   Hemoglobin 16.0 13.0 - 17.0 g/dL   HCT 47.0 39.0 - 52.0 %   Imaging Review Ct Head Wo Contrast  07/28/2015  CLINICAL DATA:  Code stroke for confusion and blurred vision. Headache behind the left eye. EXAM: CT HEAD WITHOUT CONTRAST TECHNIQUE: Contiguous axial images were obtained from the base of the skull through the vertex without intravenous contrast. COMPARISON:  04/09/2014 FINDINGS: Skull and Sinuses:Negative for fracture or destructive process. Visualized orbits: Bilateral cataract resection. No acute finding to explain blurred vision. Brain: No evidence of acute infarction, hemorrhage, hydrocephalus, or mass lesion/mass effect. Chronic small vessel disease with stable pattern. Small vessel ischemic gliosis is extensive, confluent throughout the deep cerebral white matter. Normal cerebral volume. These results were called by telephone at the time of interpretation on 07/28/2015 at 4:28 am to Dr. Wendee Beavers, who verbally acknowledged these results. IMPRESSION: 1. No acute finding or change from prior. 2. Extensive chronic microvascular ischemia. Electronically Signed   By: Monte Fantasia M.D.   On: 07/28/2015 04:28   Mr Jodene Nam Head Wo Contrast  07/28/2015  CLINICAL DATA:  75 year old hypertensive male with loss of control of his arm. Vision became blurred. Symptoms have improved. Persistent headache behind left thigh and blurred vision. Initial assessment revealed some weakness in left hand versus right. Subsequent encounter. EXAM: MRI HEAD WITHOUT CONTRAST MRA HEAD WITHOUT CONTRAST TECHNIQUE: Multiplanar, multiecho pulse sequences of the brain and  surrounding structures were obtained without intravenous contrast. Angiographic images of the head were obtained using MRA technique without contrast. COMPARISON:  07/28/2015 and  06/29/2012 head CT. No comparison brain MR. FINDINGS: MRI HEAD FINDINGS No acute infarct or intracranial hemorrhage. Significant chronic microvascular changes. Mild global atrophy without hydrocephalus. No intracranial mass lesion noted on this unenhanced exam. Small sclerotic focus right parietal calvarium without significant change of indeterminate etiology. Transverse ligament hypertrophy. Narrowing of the ventral aspect of thecal sac with minimal cord contact without significant compression. Cervical medullary junction and pituitary region unremarkable. Post lens replacement.  Orbital structures otherwise unremarkable. Minimal mucosal thickening paranasal sinuses. Major intracranial vascular structures are patent. MRA HEAD FINDINGS Anterior circulation without medium or large size vessel significant stenosis or occlusion. Fetal contribution to formation and posterior cerebral arteries bilaterally. Moderate narrowing M2 segments left middle cerebral artery branches. Mild narrowing M2 branches right middle cerebral artery branches. Ectatic distal vertebral arteries. Left vertebral artery slightly dominant in size. Mild irregularity of the distal vertebral arteries without significant narrowing. Mild to moderate narrowing of portions of the posterior inferior cerebellar artery bilaterally. Mild to moderate irregularity and narrowing of portions of the basilar artery. Nonvisualized left anterior inferior cerebellar artery. Mild to moderate narrowing and irregularity superior cerebellar artery bilaterally. Mild to moderate narrowing portions of the posterior cerebral arteries bilaterally. No aneurysm noted. IMPRESSION: MRI HEAD No acute infarct or intracranial hemorrhage. Significant chronic microvascular changes. Mild global atrophy without hydrocephalus. No intracranial mass lesion noted on this unenhanced exam. Small sclerotic focus right parietal calvarium without significant change of indeterminate etiology.  Transverse ligament hypertrophy. Narrowing of the ventral aspect of thecal sac with minimal cord contact without significant compression. MRA HEAD Intracranial atherosclerotic changes most notable involving branch vessels and posterior circulation as detailed above. Electronically Signed   By: Genia Del M.D.   On: 07/28/2015 07:41   Mr Brain Wo Contrast  07/28/2015  CLINICAL DATA:  75 year old hypertensive male with loss of control of his arm. Vision became blurred. Symptoms have improved. Persistent headache behind left thigh and blurred vision. Initial assessment revealed some weakness in left hand versus right. Subsequent encounter. EXAM: MRI HEAD WITHOUT CONTRAST MRA HEAD WITHOUT CONTRAST TECHNIQUE: Multiplanar, multiecho pulse sequences of the brain and surrounding structures were obtained without intravenous contrast. Angiographic images of the head were obtained using MRA technique without contrast. COMPARISON:  07/28/2015 and 06/29/2012 head CT. No comparison brain MR. FINDINGS: MRI HEAD FINDINGS No acute infarct or intracranial hemorrhage. Significant chronic microvascular changes. Mild global atrophy without hydrocephalus. No intracranial mass lesion noted on this unenhanced exam. Small sclerotic focus right parietal calvarium without significant change of indeterminate etiology. Transverse ligament hypertrophy. Narrowing of the ventral aspect of thecal sac with minimal cord contact without significant compression. Cervical medullary junction and pituitary region unremarkable. Post lens replacement.  Orbital structures otherwise unremarkable. Minimal mucosal thickening paranasal sinuses. Major intracranial vascular structures are patent. MRA HEAD FINDINGS Anterior circulation without medium or large size vessel significant stenosis or occlusion. Fetal contribution to formation and posterior cerebral arteries bilaterally. Moderate narrowing M2 segments left middle cerebral artery branches. Mild  narrowing M2 branches right middle cerebral artery branches. Ectatic distal vertebral arteries. Left vertebral artery slightly dominant in size. Mild irregularity of the distal vertebral arteries without significant narrowing. Mild to moderate narrowing of portions of the posterior inferior cerebellar artery bilaterally. Mild to moderate irregularity and narrowing of portions of the basilar artery. Nonvisualized left anterior inferior cerebellar artery. Mild to moderate narrowing  and irregularity superior cerebellar artery bilaterally. Mild to moderate narrowing portions of the posterior cerebral arteries bilaterally. No aneurysm noted. IMPRESSION: MRI HEAD No acute infarct or intracranial hemorrhage. Significant chronic microvascular changes. Mild global atrophy without hydrocephalus. No intracranial mass lesion noted on this unenhanced exam. Small sclerotic focus right parietal calvarium without significant change of indeterminate etiology. Transverse ligament hypertrophy. Narrowing of the ventral aspect of thecal sac with minimal cord contact without significant compression. MRA HEAD Intracranial atherosclerotic changes most notable involving branch vessels and posterior circulation as detailed above. Electronically Signed   By: Genia Del M.D.   On: 07/28/2015 07:41   I have personally reviewed and evaluated these images and lab results as part of my medical decision-making.   EKG Interpretation   Date/Time:  Monday July 28 2015 04:08:01 EDT Ventricular Rate:  94 PR Interval:  128 QRS Duration: 88 QT Interval:  356 QTC Calculation: 445 R Axis:   19 Text Interpretation:  Normal sinus rhythm Normal ECG When compared with  ECG of 04/10/2014, No significant change was found Confirmed by [facility]  MD,  Xaiver Roskelley ([id number]) on 07/28/2015 4:15:53 AM      CRITICAL CARE Performed by: [id number] Total critical care time: 45 minutes Critical care time was exclusive of separately billable procedures and  treating other patients. Critical care was necessary to treat or prevent imminent or life-threatening deterioration. Critical care was time spent personally by me on the following activities: development of treatment plan with patient and/or surrogate as well as nursing, discussions with consultants, evaluation of patient's response to treatment, examination of patient, obtaining history from patient or surrogate, ordering and performing treatments and interventions, ordering and review of laboratory studies, ordering and review of radiographic studies, pulse oximetry and re-evaluation of patient's condition.  MDM   Final diagnoses:  Transient cerebral ischemia, unspecified transient cerebral ischemia type  Severe hypertension    TIA versus stroke. Lack of coordination suggests posterior circulation stroke. Code stroke is been activated and he is sent for CT scan.Old records reviewed and he actually was admitted for TIA in January 2016. However, I do not see evidence of any vascular imaging or echocardiogram at that time. Last echocardiogram was in 2014.  Cases discussed with Dr. Wendee Beavers of neurology services who does not feel that this is a stroke or TIA although he has ordered MRI scan. He is concerned about possibility of hypertensive encephalopathy. I do not see an exam consistent with hypertensive encephalopathy although his blood pressure is significantly elevated. He is given a dose of hydralazine. Case is discussed with Dr. Juleen China of family practice service who agrees to admit the patient under observation status.  Delora Fuel, MD [phone]

## 2015-07-28 NOTE — H&P (Signed)
[facility] Admission History and Physical Service Pager: [phone]  Patient name: Steven Simmons Medical record number: [id number] Date of birth: 1940/11/10 Age: 75 y.o. Gender: male  Primary Care Provider: Paula Compton, MD Consultants: Neurology Code Status: FULL (discussed upon admission)  Chief Complaint: Inability to Control Hands   Assessment and Plan: Steven Simmons is a 75 y.o. male presenting with inability to control hands, confusion and HTN . PMH is significant for HTN, GERD, h/o TIA, and OA.   Inability to Control Hands & Confusion: Resolved at time of admission. Symptoms likely 2/2 to uncontrolled HTN (BP 222/107 at time of presentation). Also concerned for possible TIA. Neurology canceled code stroke and thought symptoms most consistent with hypertensive encephalopathy. CT head without acute finding. -admit to telemetry, vital signs per unit  -MRI/MRA pending  -permissive hypertension pending MRI results   -NPO pending RN swallow screen -risk stratification labs -patient has been non-compliant with statin therapy in the past per patient report and EMR review   Hypertensive Urgency: In the setting of medication non-compliance for several months. BP 222/107 at presentation. S/p Hydralazine IV 10 mg with improvement in BPs to [id number] systolic.  -will resume home lisinopril 40 mg and HCTZ 25 mg if MRI negative   Eye pain: patient without vision changes. Improved since arriving in the ED. No eye redness. - recheck in AM - Attempt detailed fundoscopic exam  Hyperlipidemia: Based on most recent lipid panel (10/15), 10 year ASCVD risk is over 30%. Patient has been non-compliant with statin therapy.  -lipid panel ordered  -discuss importance of statin therapy  FEN/GI: NPO pending RN swallow screen, SLIV  Prophylaxis: Lovenox   Disposition: Admit to FPTS; Attending [place]   History of Present Illness:  Steven Simmons is a 75 y.o. male  presenting with complaint of inability to move his hands. Symptoms started at around 2am this morning. He reports feeling like he could not control his hands properly to work on his computer and describes them as "feeling like they weren't real."  He was still able to pick up objects using his hands. Denies weakness of UE and LE. No problems with walking. He then developed some left eye pain. No associated visual changes present. Reports recent allergic rhinitis symptoms related to the pollen. Also reports he was very confused. He reports a similar episode last week. He has not been taking his blood pressure medication since January. Reports he monitors his BP at home and it is normal but when he comes to the clinic or hospital it is always elevated.   Review Of Systems: Per HPI with the following additions:  None  Otherwise the remainder of the systems were negative.  Patient Active Problem List   Diagnosis Date Noted  . Allergic rhinitis 04/10/2014  . Blurred vision   . Accelerated hypertension   . Hyperlipidemia   . Tobacco abuse   . Cough 01/25/2014  . Erectile dysfunction 12/28/2013  . HLD (hyperlipidemia) 08/11/2012  . TIA (transient ischemic attack) 06/29/2012  . HTN (hypertension) 06/29/2012    Past Medical History: Past Medical History  Diagnosis Date  . Peptic ulcer disease   . Hypertension   . GERD (gastroesophageal reflux disease)   . History of TIA (transient ischemic attack)   . Complication of anesthesia     " dont know the name of it ,but they gave it to me at [facility] in 1983 "   I was cold and had a head ache afterwards "  .  Headache   . Arthritis     shoulders     Past Surgical History: Past Surgical History  Procedure Laterality Date  . Cholecystectomy    . Hemorrhoid surgery  1993  . Cataract extraction Bilateral   . Colonoscopy  2012 ?    Social History: Social History  Substance Use Topics  . Smoking status: Former Smoker    Types: Cigarettes     Quit date: 04/20/1977  . Smokeless tobacco: Never Used  . Alcohol Use: No   Additional social history: No drug use.   Please also refer to relevant sections of EMR.  Family History: Family History  Problem Relation Age of Onset  . Heart disease Mother   . Hypertension Mother   . Heart disease Father   . Hypertension Father     Allergies and Medications: Allergies  Allergen Reactions  . Ceclor [Cefaclor] Other (See Comments)    Makes my heart slow and I feel like I am fading away  . Veralipride     Gums bleed, tooth aches  . Amlodipine Other (See Comments)    Gum bleeding  . Antihistamines, Chlorpheniramine-Type Other (See Comments)    Dizziness and nose bleeds  . Beta Adrenergic Blockers Other (See Comments)    Gums bleed  . Celebrex [Celecoxib] Other (See Comments)    Stomach pain  . Ibuprofen Other (See Comments)    Stomach pain    . Lipitor [Atorvastatin] Hypertension  . Nutrasweet Aspartame [Aspartame] Other (See Comments)    Memory loss  . Penicillins Other (See Comments)    Makes my heart slow and I feel like I am fading away   No current facility-administered medications on file prior to encounter.   Current Outpatient Prescriptions on File Prior to Encounter  Medication Sig Dispense Refill  . aspirin 81 MG chewable tablet Chew 81 mg by mouth daily as needed (occasional use for headache).     . fluticasone (FLONASE) 50 MCG/ACT nasal spray Place 2 sprays into both nostrils daily. 16 g 1  . lisinopril-hydrochlorothiazide (PRINZIDE,ZESTORETIC) 20-12.5 MG tablet Take 2 tablets by mouth daily. 180 tablet 0  . albuterol (PROVENTIL HFA;VENTOLIN HFA) 108 (90 BASE) MCG/ACT inhaler Inhale 2 puffs into the lungs every 6 (six) hours as needed for wheezing or shortness of breath (cough). (Patient not taking: Reported on 04/09/2014) 1 Inhaler 2  . benzonatate (TESSALON) 100 MG capsule Take 1-2 capsules (100-200 mg total) by mouth every 8 (eight) hours as needed for cough.  (Patient not taking: Reported on 07/28/2015) 30 capsule 0  . guaiFENesin-codeine 100-10 MG/5ML syrup Take 10 mLs by mouth 3 (three) times daily as needed for cough. (Patient not taking: Reported on 07/28/2015) 120 mL 0  . lovastatin (MEVACOR) 40 MG tablet Take 1 tablet (40 mg total) by mouth at bedtime. (Patient not taking: Reported on 07/28/2015) 90 tablet 1  . simethicone (MYLICON) 80 MG chewable tablet Chew 1 tablet (80 mg total) by mouth 4 (four) times daily as needed for flatulence. (Patient not taking: Reported on 07/28/2015) 30 tablet 0  . tadalafil (CIALIS) 10 MG tablet Take 1 tablet (10 mg total) by mouth daily as needed for erectile dysfunction. Take 30 minutes prior to sexual activity. Do not take more than one tablet daily. (Patient not taking: Reported on 07/28/2015) 5 tablet 0    Objective: BP 200/97 mmHg  Pulse 68  Temp(Src) 98.4 F (36.9 C) (Oral)  Resp 12  SpO2 99% Exam: General: elderly male lying in bed in  NAD  Eyes: EOMI. PERRL. No conjunctivitis.  ENTM: Oropharynx clear. Very poor dentition. Mild TTP over left maxillary sinus.  Neck: Full ROM.  Cardiovascular: RRR. No murmurs appreciated. 2+ pedal pulses.  Respiratory: CTAB. Normal WOB.  Abdomen: obese, soft, NTND, +BS  MSK: Moves all extremities spontaneously.  Skin: Warm and dry.  Neuro: CN II-XII grossly intact. Normal finger-to-nose and heel-to-shin testing. Strength 5/5 in all extremities. No palmar drift. Speech is normal.  Psych: Appropriate mood and affect.   Labs and Imaging: CBC BMET   Recent Labs Lab 07/28/15 0413 07/28/15 0419  WBC 10.5  --   HGB 14.2 16.0  HCT 44.3 47.0  PLT 319  --     Recent Labs Lab 07/28/15 0413 07/28/15 0419  NA 138 141  K 3.8 3.9  CL 103 102  CO2 25  --   BUN 14 16  CREATININE 0.87 0.90  GLUCOSE 134* 129*  CALCIUM 9.4  --      iStat Troponin 0.01  Ct Head Wo Contrast  07/28/2015  CLINICAL DATA:  Code stroke for confusion and blurred vision. Headache behind the  left eye. EXAM: CT HEAD WITHOUT CONTRAST TECHNIQUE: Contiguous axial images were obtained from the base of the skull through the vertex without intravenous contrast. COMPARISON:  04/09/2014 FINDINGS: Skull and Sinuses:Negative for fracture or destructive process. Visualized orbits: Bilateral cataract resection. No acute finding to explain blurred vision. Brain: No evidence of acute infarction, hemorrhage, hydrocephalus, or mass lesion/mass effect. Chronic small vessel disease with stable pattern. Small vessel ischemic gliosis is extensive, confluent throughout the deep cerebral white matter. Normal cerebral volume. These results were called by telephone at the time of interpretation on 07/28/2015 at 4:28 am to Dr. Wendee Beavers, who verbally acknowledged these results. IMPRESSION: 1. No acute finding or change from prior. 2. Extensive chronic microvascular ischemia. Electronically Signed   By: Monte Fantasia M.D.   On: 07/28/2015 04:28    Nicolette Bang, DO 07/28/2015, 5:23 AM PGY-1, [facility] pager: [phone], text pages welcome  I have seen and examined the patient. I have read and agree with the above note. My changes are noted in blue.  Cordelia Poche, MD PGY-3, [facility] 07/28/2015, 7:25 AM

## 2015-07-28 NOTE — Consult Note (Signed)
Neurology Consultation Reason for Consult: confusion Referring Physician: Dr. Roxanne Mins  CC: confusion  History is obtained from:pt  HPI: Steven Simmons is a 75 y.o. male with hx of HTN and a prior TIA dx who presents to the hospital after he was sitting at his computer and he says he "did not know what to do".  Says he did not realize that his hands or legs "worked" and that this lasted for 20 minutes.  Says this has never happened before to him.  Denies focal sx - mostly just felt confused - then this was followed by a sudden onset of a sharp pain behind the left eye which has persisted since.  Even though he is no longer confused he feels that he is still "not right".  On my exam in the ED he was completed normal and his sx had resolved.  I cancelled the code stroke. NIHSS = 0.   LKW: 2am tpa given?: no  ROS: A 14 point ROS was performed and is negative except as noted in the HPI.    Past Medical History  Diagnosis Date  . Peptic ulcer disease   . Hypertension   . GERD (gastroesophageal reflux disease)   . History of TIA (transient ischemic attack)   . Complication of anesthesia     " dont know the name of it ,but they gave it to me at [facility] in 1983 "   I was cold and had a head ache afterwards "  . Headache   . Arthritis     shoulders     Family History  Problem Relation Age of Onset  . Heart disease Mother   . Hypertension Mother   . Heart disease Father   . Hypertension Father     Social History:  reports that he quit smoking about 38 years ago. His smoking use included Cigarettes. He has never used smokeless tobacco. He reports that he does not drink alcohol or use illicit drugs.  Exam: Current vital signs: BP 222/107 mmHg  Pulse 101  Temp(Src) 98.4 F (36.9 C) (Oral)  Resp 20  SpO2 99% Vital signs in last 24 hours: Temp:  [98.4 F (36.9 C)] 98.4 F (36.9 C) (04/24 0400) Pulse Rate:  [101] 101 (04/24 0400) Resp:  [20] 20 (04/24 0400) BP: (222)/(107)  222/107 mmHg (04/24 0400) SpO2:  [99 %] 99 % (04/24 0400)   Physical Exam  Constitutional: Appears well-developed and well-nourished.  Poor dentition Psych: Affect appropriate to situation Eyes: No scleral injection HENT: No OP obstrucion Head: Normocephalic.  Cardiovascular: Normal rate and regular rhythm.  Respiratory: Effort normal and breath sounds normal to anterior ascultation GI: Soft.  No distension. There is no tenderness.  Skin: WDI  Neuro: Mental Status: Patient is awake, alert, oriented to person, place, month, year, and situation. Patient is able to give a clear and coherent history. No signs of aphasia or neglect Cranial Nerves II: Visual Fields are full. Pupils are equal, round, and reactive to light. III,IV, VI: EOMI without ptosis or diploplia.  V: Facial sensation is symmetric to temperature VII: Facial movement is symmetric.  VIII: hearing is intact to voice X: Uvula elevates symmetrically XI: Shoulder shrug is symmetric. XII: tongue is midline without atrophy or fasciculations.  Motor: Tone is normal. Bulk is normal. 5/5 strength was present in all four extremities. Sensory: Sensation is symmetric to light touch and temperature in the arms and legs Deep Tendon Reflexes: 2+ and symmetric in the biceps and patellae.  Plantars: Toes are downgoing bilaterally. Cerebellar: FNF and HKS are intact bilaterally    I have reviewed labs in epic and the results pertinent to this consultation are:  Normal BMP  I have reviewed the images obtained:  CT is negative for acute pahtology he has prominent white matter disease  Impression: transient confusion in the setting of extreme hypertension.  This is consistent with hypertensive encephalopathy.  reocmmend brain MRI and if negative blood pressure control.  Consider medicine consultation regarding dispo. I am going to order a STAT MRI as this will help triage patient.  If pres is present then might need admission to a  setting where his BP can be rapidly brought down via drip  Recommendations: 1) as above

## 2015-07-28 NOTE — ED Notes (Addendum)
Stroke cancelled

## 2015-07-28 NOTE — ED Notes (Signed)
Stroke called

## 2015-07-28 NOTE — ED Notes (Signed)
LKW 0200. Patient states around that time he lost control of his arm and his vision became blurred. Since that time symptoms have improved, but patient still has headache behind left eye and blurred vision. Initial assessment revealed some weakness in left hand vs right.

## 2015-07-28 NOTE — Progress Notes (Signed)
Please see note by Dr. Wendee Beavers earlier. He has returned to baseline, just with mild persistent headahce.   I agree with the suspicion that this is hypertensive encephalopathy, no focal findings to suggest TIA or stroke. MRI is negative.  1) Treat as hypertensive emergency.  2) Will defer to primary team regarding BP management.  3) Will give one time dose of reglan for headache, may be some migrainous component, but suspect that this is also related to BP.  4) please call with any further questions or concerns.   Roland Rack, MD [facility] [phone]  If 7pm- 7am, please page neurology on call as listed in [place].

## 2015-07-28 NOTE — Care Management Note (Signed)
Case Management Note  Patient Details  Name: Steven Simmons MRN: [id number] Date of Birth: 12-17-1940  Subjective/Objective:  Pt in with TIA. He is from home alone.                   Action/Plan: Awaiting PT/OT recs. CM following for discharge needs.   Expected Discharge Date:                  Expected Discharge Plan:     In-House Referral:     Discharge planning Services     Post Acute Care Choice:    Choice offered to:     DME Arranged:    DME Agency:     HH Arranged:    HH Agency:     Status of Service:  In process, will continue to follow  Medicare Important Message Given:    Date Medicare IM Given:    Medicare IM give by:    Date Additional Medicare IM Given:    Additional Medicare Important Message give by:     If discussed at [place] of Stay Meetings, dates discussed:    Additional Comments:  Pollie Friar, RN 07/28/2015, 3:05 PM

## 2015-07-28 NOTE — Discharge Instructions (Signed)
It has been a pleasure taking care of you! You were admitted due to severely elevated blood pressure. We did imaging of your head to rule out stroke.  We are discharging you on Lipitor for your cholesterol medication to help prevent heart attacks & strokes. We strongly recommend you take your blood pressure medication as well.  Please, make sure to read the directions before you take them. The names and directions on how to take these medications are found on this discharge paper under medication section.  You also need a follow up with your primary care doctor. The address, date and time are found on the discharge paper under follow up section.  Take care,

## 2015-07-28 NOTE — Discharge Summary (Signed)
[facility] Discharge Summary  Patient name: Steven Simmons Medical record number: [id number] Date of birth: [birth year] Age: 75 y.o. Gender: male Date of Admission: 07/28/2015  Date of Discharge: 07/28/2015 Admitting Physician: Lupita Dawn, MD  Primary Care Provider: Paula Compton, MD Consultants: Neurology   Indication for Hospitalization: Accelerated HTN   Discharge Diagnoses/Problem List:  Patient Active Problem List   Diagnosis Date Noted  . Hypertensive encephalopathy   . Severe hypertension   . Allergic rhinitis 04/10/2014  . Blurred vision   . Accelerated hypertension   . Hyperlipidemia   . Tobacco abuse   . Cough 01/25/2014  . Erectile dysfunction 12/28/2013  . HLD (hyperlipidemia) 08/11/2012  . TIA (transient ischemic attack) 06/29/2012  . HTN (hypertension) 06/29/2012    Disposition: Home   Discharge Condition: Stable   Discharge Exam:  General: elderly male lying in bed in NAD  Eyes: EOMI. PERRL. No conjunctivitis.  ENTM: Oropharynx clear. Very poor dentition. Mild TTP over left maxillary sinus.  Neck: Full ROM.  Cardiovascular: RRR. No murmurs appreciated. 2+ pedal pulses.  Respiratory: CTAB. Normal WOB.  Abdomen: obese, soft, NTND, +BS  MSK: Moves all extremities spontaneously.  Skin: Warm and dry.  Neuro: CN II-XII grossly intact. Normal finger-to-nose and heel-to-shin testing. Strength 5/5 in all extremities. No palmar drift. Speech is normal.  Psych: Appropriate mood and affect.   Brief Hospital Course:  Steven Simmons is 75 y.o. male with PMH uncontrolled HTN. TIA, GERD, and OA who presented with inability to control hands and confusion.   Neurological Symptoms: Concerning for TIA at presentation. Symptoms resolved quickly. CT and MRI negative for acute CVA. Symptoms likely related to accelerated HTN. Changed home Lovastatin to Lipitor based on 10 year ASCVD risk score. Continued home ASA.  Accelerated HTN:  Patient presented with BP on 222/107. Patient reports non-compliance with anti-hypertensive medications for past 3 months. Given 10 mg IV hydralazine per neurology recommendations in ED. Once CVA ruled out, restarted on home Lisinopril-HCTZ. BPs stabilized shortly thereafter. No evidence of cardiac or renal end organ damage present at this admission.   Posterior Left Eye Pain: Without associated visual changes. Unable to adequately visualize fundus on exam. Potentially an occular migraine or related to elevated BP. Eye pain improved with one time dose of Reglan.   Issues for Follow Up:  1. Patient reports BPs are normotensive at home and that is why he is non-compliant with medical therapy. Would recommend appointment with [facility] for 24 hour outpatient blood pressure monitoring.  2. Would recommend outpatient opthalmology follow up for left eye pain and history of uncontrolled HTN.   Significant Procedures: None   Significant Labs and Imaging:   Recent Labs Lab 07/28/15 0413 07/28/15 0419 07/28/15 1320  WBC 10.5  --  8.9  HGB 14.2 16.0 13.7  HCT 44.3 47.0 41.3  PLT 319  --  287    Recent Labs Lab 07/28/15 0413 07/28/15 0419 07/28/15 1320  NA 138 141  --   K 3.8 3.9  --   CL 103 102  --   CO2 25  --   --   GLUCOSE 134* 129*  --   BUN 14 16  --   CREATININE 0.87 0.90 0.86  CALCIUM 9.4  --   --   ALKPHOS 99  --   --   AST 20  --   --   ALT 16*  --   --   ALBUMIN 3.9  --   --  Ct Head Wo Contrast  07/28/2015  CLINICAL DATA:  Code stroke for confusion and blurred vision. Headache behind the left eye. EXAM: CT HEAD WITHOUT CONTRAST TECHNIQUE: Contiguous axial images were obtained from the base of the skull through the vertex without intravenous contrast. COMPARISON:  04/09/2014 FINDINGS: Skull and Sinuses:Negative for fracture or destructive process. Visualized orbits: Bilateral cataract resection. No acute finding to explain blurred vision. Brain: No evidence of acute  infarction, hemorrhage, hydrocephalus, or mass lesion/mass effect. Chronic small vessel disease with stable pattern. Small vessel ischemic gliosis is extensive, confluent throughout the deep cerebral white matter. Normal cerebral volume. These results were called by telephone at the time of interpretation on 07/28/2015 at 4:28 am to Dr. Wendee Beavers, who verbally acknowledged these results. IMPRESSION: 1. No acute finding or change from prior. 2. Extensive chronic microvascular ischemia. Electronically Signed   By: Monte Fantasia M.D.   On: 07/28/2015 04:28   Mr Jodene Nam Head Wo Contrast  07/28/2015  CLINICAL DATA:  75 year old hypertensive male with loss of control of his arm. Vision became blurred. Symptoms have improved. Persistent headache behind left thigh and blurred vision. Initial assessment revealed some weakness in left hand versus right. Subsequent encounter. EXAM: MRI HEAD WITHOUT CONTRAST MRA HEAD WITHOUT CONTRAST TECHNIQUE: Multiplanar, multiecho pulse sequences of the brain and surrounding structures were obtained without intravenous contrast. Angiographic images of the head were obtained using MRA technique without contrast. COMPARISON:  07/28/2015 and 06/29/2012 head CT. No comparison brain MR. FINDINGS: MRI HEAD FINDINGS No acute infarct or intracranial hemorrhage. Significant chronic microvascular changes. Mild global atrophy without hydrocephalus. No intracranial mass lesion noted on this unenhanced exam. Small sclerotic focus right parietal calvarium without significant change of indeterminate etiology. Transverse ligament hypertrophy. Narrowing of the ventral aspect of thecal sac with minimal cord contact without significant compression. Cervical medullary junction and pituitary region unremarkable. Post lens replacement.  Orbital structures otherwise unremarkable. Minimal mucosal thickening paranasal sinuses. Major intracranial vascular structures are patent. MRA HEAD FINDINGS Anterior circulation  without medium or large size vessel significant stenosis or occlusion. Fetal contribution to formation and posterior cerebral arteries bilaterally. Moderate narrowing M2 segments left middle cerebral artery branches. Mild narrowing M2 branches right middle cerebral artery branches. Ectatic distal vertebral arteries. Left vertebral artery slightly dominant in size. Mild irregularity of the distal vertebral arteries without significant narrowing. Mild to moderate narrowing of portions of the posterior inferior cerebellar artery bilaterally. Mild to moderate irregularity and narrowing of portions of the basilar artery. Nonvisualized left anterior inferior cerebellar artery. Mild to moderate narrowing and irregularity superior cerebellar artery bilaterally. Mild to moderate narrowing portions of the posterior cerebral arteries bilaterally. No aneurysm noted. IMPRESSION: MRI HEAD No acute infarct or intracranial hemorrhage. Significant chronic microvascular changes. Mild global atrophy without hydrocephalus. No intracranial mass lesion noted on this unenhanced exam. Small sclerotic focus right parietal calvarium without significant change of indeterminate etiology. Transverse ligament hypertrophy. Narrowing of the ventral aspect of thecal sac with minimal cord contact without significant compression. MRA HEAD Intracranial atherosclerotic changes most notable involving branch vessels and posterior circulation as detailed above. Electronically Signed   By: Genia Del M.D.   On: 07/28/2015 07:41   Mr Brain Wo Contrast  07/28/2015  CLINICAL DATA:  75 year old hypertensive male with loss of control of his arm. Vision became blurred. Symptoms have improved. Persistent headache behind left thigh and blurred vision. Initial assessment revealed some weakness in left hand versus right. Subsequent encounter. EXAM: MRI HEAD WITHOUT CONTRAST MRA HEAD WITHOUT  CONTRAST TECHNIQUE: Multiplanar, multiecho pulse sequences of the  brain and surrounding structures were obtained without intravenous contrast. Angiographic images of the head were obtained using MRA technique without contrast. COMPARISON:  07/28/2015 and 06/29/2012 head CT. No comparison brain MR. FINDINGS: MRI HEAD FINDINGS No acute infarct or intracranial hemorrhage. Significant chronic microvascular changes. Mild global atrophy without hydrocephalus. No intracranial mass lesion noted on this unenhanced exam. Small sclerotic focus right parietal calvarium without significant change of indeterminate etiology. Transverse ligament hypertrophy. Narrowing of the ventral aspect of thecal sac with minimal cord contact without significant compression. Cervical medullary junction and pituitary region unremarkable. Post lens replacement.  Orbital structures otherwise unremarkable. Minimal mucosal thickening paranasal sinuses. Major intracranial vascular structures are patent. MRA HEAD FINDINGS Anterior circulation without medium or large size vessel significant stenosis or occlusion. Fetal contribution to formation and posterior cerebral arteries bilaterally. Moderate narrowing M2 segments left middle cerebral artery branches. Mild narrowing M2 branches right middle cerebral artery branches. Ectatic distal vertebral arteries. Left vertebral artery slightly dominant in size. Mild irregularity of the distal vertebral arteries without significant narrowing. Mild to moderate narrowing of portions of the posterior inferior cerebellar artery bilaterally. Mild to moderate irregularity and narrowing of portions of the basilar artery. Nonvisualized left anterior inferior cerebellar artery. Mild to moderate narrowing and irregularity superior cerebellar artery bilaterally. Mild to moderate narrowing portions of the posterior cerebral arteries bilaterally. No aneurysm noted. IMPRESSION: MRI HEAD No acute infarct or intracranial hemorrhage. Significant chronic microvascular changes. Mild global  atrophy without hydrocephalus. No intracranial mass lesion noted on this unenhanced exam. Small sclerotic focus right parietal calvarium without significant change of indeterminate etiology. Transverse ligament hypertrophy. Narrowing of the ventral aspect of thecal sac with minimal cord contact without significant compression. MRA HEAD Intracranial atherosclerotic changes most notable involving branch vessels and posterior circulation as detailed above. Electronically Signed   By: Genia Del M.D.   On: 07/28/2015 07:41    Results/Tests Pending at Time of Discharge: Hemoglobin A1C   Discharge Medications:    Medication List    STOP taking these medications        benzonatate 100 MG capsule  Commonly known as:  TESSALON     guaiFENesin-codeine 100-10 MG/5ML syrup     lovastatin 40 MG tablet  Commonly known as:  MEVACOR     tadalafil 10 MG tablet  Commonly known as:  CIALIS      TAKE these medications        albuterol 108 (90 Base) MCG/ACT inhaler  Commonly known as:  PROVENTIL HFA;VENTOLIN HFA  Inhale 2 puffs into the lungs every 6 (six) hours as needed for wheezing or shortness of breath (cough).     aspirin 81 MG chewable tablet  Chew 81 mg by mouth daily as needed (occasional use for headache).     atorvastatin 40 MG tablet  Commonly known as:  LIPITOR  Take 1 tablet (40 mg total) by mouth daily at 6 PM.     fluticasone 50 MCG/ACT nasal spray  Commonly known as:  FLONASE  Place 2 sprays into both nostrils daily.     lisinopril-hydrochlorothiazide 20-12.5 MG tablet  Commonly known as:  PRINZIDE,ZESTORETIC  Take 2 tablets by mouth daily.     simethicone 80 MG chewable tablet  Commonly known as:  MYLICON  Chew 1 tablet (80 mg total) by mouth 4 (four) times daily as needed for flatulence.        Discharge Instructions: Please refer to Patient Instructions section  of EMR for full details.  Patient was counseled important signs and symptoms that should prompt return  to medical care, changes in medications, dietary instructions, activity restrictions, and follow up appointments.   Follow-Up Appointments: Follow-up Information    Follow up with [facility]. Go on 08/01/2015.   Specialty:  Family Medicine   Why:  For Hospital Followup at 3:00 with Dr. Nanda Quinton information:   [address] [id number] [place] [id number] [place], DO 07/28/2015, 9:36 PM PGY-1, [place]

## 2015-07-29 LAB — HEMOGLOBIN A1C
HEMOGLOBIN A1C: 6.1 % — AB (ref 4.8–5.6)
Mean Plasma Glucose: 128 mg/dL

## 2015-08-01 ENCOUNTER — Inpatient Hospital Stay: Payer: Medicare Other | Admitting: Family Medicine

## 2015-08-04 ENCOUNTER — Encounter: Payer: Self-pay | Admitting: Family Medicine

## 2015-08-04 ENCOUNTER — Ambulatory Visit (INDEPENDENT_AMBULATORY_CARE_PROVIDER_SITE_OTHER): Payer: Medicare Other | Admitting: Family Medicine

## 2015-08-04 VITALS — BP 140/93 | Temp 98.3°F | Resp 94 | Ht 70.0 in | Wt 218.0 lb

## 2015-08-04 DIAGNOSIS — E785 Hyperlipidemia, unspecified: Secondary | ICD-10-CM | POA: Diagnosis not present

## 2015-08-04 DIAGNOSIS — I10 Essential (primary) hypertension: Secondary | ICD-10-CM

## 2015-08-04 DIAGNOSIS — H538 Other visual disturbances: Secondary | ICD-10-CM | POA: Diagnosis not present

## 2015-08-04 MED ORDER — LISINOPRIL-HYDROCHLOROTHIAZIDE 20-12.5 MG PO TABS: 2.0000 | ORAL_TABLET | Freq: Every day | ORAL | Status: DC

## 2015-08-04 NOTE — Assessment & Plan Note (Signed)
He reports intolerance to Lipitor.  - ASCVD risk:  50% - recommended retrying Lipitor and calling if unable to tolerated.  Would recommend Crestor at that time

## 2015-08-04 NOTE — Assessment & Plan Note (Addendum)
BP mildly elevated today.  Recommended checking blood pressure 2 to 3 times daily and follow-up in 1-2 weeks.  Also discussed possibility of 24-hour ambulatory blood pressure monitoring - Recommend ASA 81mg  qd - Recommended reestablishing with his ophthalmologist

## 2015-08-04 NOTE — Progress Notes (Signed)
   Subjective:    Patient ID: Steven Simmons, male    DOB: 02/24/1941, 75 y.o.   MRN: [id number]  Seen for hospital follow-up for   CC: Hypertension  - He reports compliance with lisinopril-HCTZ - Tried Lipitor once, but caused headache , so he discontinued it - Denies chest pain, shortness breath, palpitations  Smoking history noted  Review of Systems   See HPI for ROS. Objective:  BP 140/93 mmHg  Temp(Src) 98.3 F (36.8 C) (Oral)  Resp 94  Ht 5\' 10"  (1.778 m)  Wt 218 lb (98.884 kg)  BMI 31.28 kg/m2  SpO2 93%  General: NAD Cardiac: RRR, normal heart sounds, no murmurs. 2+ radial and PT pulses bilaterally Respiratory: CTAB, normal effort    Assessment & Plan:   HTN (hypertension) BP mildly elevated today.  Recommended checking blood pressure 2 to 3 times daily and follow-up in 1-2 weeks.  Also discussed possibility of 24-hour ambulatory blood pressure monitoring - Recommend ASA 81mg  qd - Recommended reestablishing with his ophthalmologist  HLD (hyperlipidemia) He reports intolerance to Lipitor.  - ASCVD risk:  50% - recommended retrying Lipitor and calling if unable to tolerated.  Would recommend Crestor at that time

## 2015-08-25 ENCOUNTER — Ambulatory Visit: Payer: Medicare Other | Admitting: Family Medicine

## 2015-12-31 DIAGNOSIS — Z23 Encounter for immunization: Secondary | ICD-10-CM | POA: Diagnosis not present

## 2016-01-26 ENCOUNTER — Other Ambulatory Visit: Payer: Self-pay | Admitting: Family Medicine

## 2016-01-26 ENCOUNTER — Other Ambulatory Visit: Payer: Self-pay | Admitting: Student

## 2016-01-26 DIAGNOSIS — I10 Essential (primary) hypertension: Secondary | ICD-10-CM

## 2016-01-27 MED ORDER — LISINOPRIL-HYDROCHLOROTHIAZIDE 20-12.5 MG PO TABS: 2.0000 | ORAL_TABLET | Freq: Every day | ORAL | 2 refills | Status: DC

## 2016-01-27 NOTE — Telephone Encounter (Signed)
[name] would like a refill of the following medications:     lisinopril-hydrochlorothiazide (PRINZIDE,ZESTORETIC) 20-12.5 MG tablet Marina Goodell, MD]    Preferred pharmacy: [id number] - [place], [place]

## 2016-04-30 ENCOUNTER — Encounter: Payer: Self-pay | Admitting: Student

## 2016-05-03 ENCOUNTER — Encounter: Payer: Self-pay | Admitting: *Deleted

## 2016-05-03 ENCOUNTER — Telehealth: Payer: Self-pay | Admitting: Student

## 2016-05-03 DIAGNOSIS — I10 Essential (primary) hypertension: Secondary | ICD-10-CM

## 2016-05-03 MED ORDER — LISINOPRIL-HYDROCHLOROTHIAZIDE 20-12.5 MG PO TABS: 2.0000 | ORAL_TABLET | Freq: Every day | ORAL | 2 refills | Status: DC

## 2016-05-03 NOTE — Telephone Encounter (Signed)
Blood pressure medication refilled per patient request. He was last seen 1 year ago by his previous PCP. Please call the patient and ask him to make an appointment with me

## 2016-05-03 NOTE — Telephone Encounter (Signed)
Mychart message sent to patient asking him to call or respond to my message for a follow up appointment. Temara Lanum,CMA

## 2016-09-17 ENCOUNTER — Encounter (HOSPITAL_COMMUNITY): Payer: Self-pay

## 2016-09-17 ENCOUNTER — Emergency Department (HOSPITAL_COMMUNITY)
Admission: EM | Admit: 2016-09-17 | Discharge: 2016-09-17 | Disposition: A | Discharge status: Home / Self Care | Payer: Medicare Other | Attending: Emergency Medicine | Admitting: Emergency Medicine

## 2016-09-17 DIAGNOSIS — R531 Weakness: Secondary | ICD-10-CM | POA: Insufficient documentation

## 2016-09-17 DIAGNOSIS — I10 Essential (primary) hypertension: Secondary | ICD-10-CM | POA: Diagnosis not present

## 2016-09-17 DIAGNOSIS — Z87891 Personal history of nicotine dependence: Secondary | ICD-10-CM | POA: Insufficient documentation

## 2016-09-17 DIAGNOSIS — R197 Diarrhea, unspecified: Secondary | ICD-10-CM | POA: Diagnosis not present

## 2016-09-17 DIAGNOSIS — Z7982 Long term (current) use of aspirin: Secondary | ICD-10-CM | POA: Diagnosis not present

## 2016-09-17 DIAGNOSIS — Z79899 Other long term (current) drug therapy: Secondary | ICD-10-CM | POA: Diagnosis not present

## 2016-09-17 DIAGNOSIS — Z8673 Personal history of transient ischemic attack (TIA), and cerebral infarction without residual deficits: Secondary | ICD-10-CM | POA: Insufficient documentation

## 2016-09-17 LAB — COMPREHENSIVE METABOLIC PANEL
ALBUMIN: 3.8 g/dL (ref 3.5–5.0)
ALK PHOS: 98 U/L (ref 38–126)
ALT: 17 U/L (ref 17–63)
ANION GAP: 9 (ref 5–15)
AST: 26 U/L (ref 15–41)
BILIRUBIN TOTAL: 1 mg/dL (ref 0.3–1.2)
BUN: 12 mg/dL (ref 6–20)
CALCIUM: 9.7 mg/dL (ref 8.9–10.3)
CO2: 28 mmol/L (ref 22–32)
Chloride: 101 mmol/L (ref 101–111)
Creatinine, Ser: 1.05 mg/dL (ref 0.61–1.24)
GFR calc Af Amer: >60 mL/min (ref >60)
Glucose, Bld: 139 mg/dL — ABNORMAL HIGH (ref 65–99)
POTASSIUM: 3.9 mmol/L (ref 3.5–5.1)
Sodium: 138 mmol/L (ref 135–145)
TOTAL PROTEIN: 7.6 g/dL (ref 6.5–8.1)

## 2016-09-17 LAB — CBC
HEMATOCRIT: 40.8 % (ref 39.0–52.0)
Hemoglobin: 13.6 g/dL (ref 13.0–17.0)
MCH: 29.7 pg (ref 26.0–34.0)
MCHC: 33.3 g/dL (ref 30.0–36.0)
MCV: 89.1 fL (ref 78.0–100.0)
Platelets: 319 10*3/uL (ref 150–400)
RBC: 4.58 MIL/uL (ref 4.22–5.81)
RDW: 13.8 % (ref 11.5–15.5)
WBC: 7.8 10*3/uL (ref 4.0–10.5)

## 2016-09-17 LAB — I-STAT TROPONIN, ED: TROPONIN I, POC: 0 ng/mL (ref 0.00–0.08)

## 2016-09-17 LAB — LIPASE, BLOOD: Lipase: 26 U/L (ref 11–51)

## 2016-09-17 NOTE — ED Triage Notes (Signed)
Pt here with c/o hypertension. He states two ago ago he took his BP it was 449 systolic. "Its been fluctuating quite a bit." He also reports having diarrhea last three days after he eats. Pt currently denies any pain.

## 2016-09-17 NOTE — ED Notes (Signed)
Pt aware that urine sample is needed, but is unable to provide one at this time 

## 2016-09-17 NOTE — ED Provider Notes (Signed)
[facility] DEPT Provider Note   CSN: [id number] Arrival date & time: 09/17/16  1404     History   Chief Complaint Chief Complaint  Patient presents with  . Hypertension    HPI Steven Simmons is a 76 y.o. male.Complains of mild generalized weakness for the past 3 days. No other complaint. He's had one episode of diarrhea per day for 3 days. No diarrhea today. Last episode was yesterday. No chest pain or shortness of breath no other associated symptoms. He took his blood pressure at home and it was 643 systolic. He came here for evaluation. No recent travel or antibiotic use. No other associated symptoms. No treatment prior to coming  HPI  Past Medical History:  Diagnosis Date  . Arthritis    shoulders   . Complication of anesthesia    " dont know the name of it ,but they gave it to me at [facility] in 1983 "   I was cold and had a head ache afterwards "  . GERD (gastroesophageal reflux disease)   . Headache   . History of TIA (transient ischemic attack)   . Hypertension   . Peptic ulcer disease     Patient Active Problem List   Diagnosis Date Noted  . Hypertensive encephalopathy   . Allergic rhinitis 04/10/2014  . Blurred vision   . Tobacco abuse   . Erectile dysfunction 12/28/2013  . HLD (hyperlipidemia) 08/11/2012  . TIA (transient ischemic attack) 06/29/2012  . HTN (hypertension) 06/29/2012    Past Surgical History:  Procedure Laterality Date  . CATARACT EXTRACTION Bilateral   . CHOLECYSTECTOMY    . COLONOSCOPY  2012 ?  Marland Kitchen [place] Medications    Prior to Admission medications   Medication Sig Start Date End Date Taking? Authorizing Provider  albuterol (PROVENTIL HFA;VENTOLIN HFA) 108 (90 BASE) MCG/ACT inhaler Inhale 2 puffs into the lungs every 6 (six) hours as needed for wheezing or shortness of breath (cough). Patient not taking: Reported on 04/09/2014 01/25/14   Rosemarie Ax, MD  aspirin 81 MG chewable tablet Chew  81 mg by mouth daily as needed (occasional use for headache).     [provider]  atorvastatin (LIPITOR) 40 MG tablet Take 1 tablet (40 mg total) by mouth daily at 6 PM. 07/28/15   Nicolette Bang, DO  fluticasone [facility]) 50 MCG/ACT nasal spray Place 2 sprays into both nostrils daily. 04/10/14   Leone Haven, MD  lisinopril-hydrochlorothiazide (PRINZIDE,ZESTORETIC) 20-12.5 MG tablet Take 2 tablets by mouth daily. 05/03/16   Veatrice Bourbon, MD  simethicone (MYLICON) 80 MG chewable tablet Chew 1 tablet (80 mg total) by mouth 4 (four) times daily as needed for flatulence. Patient not taking: Reported on 07/28/2015 04/10/14   Katheren Shams, DO    Family History Family History  Problem Relation Age of Onset  . Heart disease Mother   . Hypertension Mother   . Heart disease Father   . Hypertension Father     Social History Social History  Substance Use Topics  . Smoking status: Former Smoker    Types: Cigarettes    Quit date: 04/20/1977  . Smokeless tobacco: Never Used  . Alcohol use No     Allergies   Ceclor [cefaclor]; Veralipride; Amlodipine; Antihistamines, chlorpheniramine-type; Beta adrenergic blockers; Celebrex [celecoxib]; Ibuprofen; Lipitor [atorvastatin]; Nutrasweet aspartame [aspartame]; and Penicillins   Review of Systems Review of Systems  Constitutional: Negative.   HENT: Negative.  Respiratory: Negative.   Cardiovascular: Negative.   Gastrointestinal: Positive for diarrhea.  Musculoskeletal: Negative.   Skin: Negative.   Neurological: Positive for light-headedness.  Psychiatric/Behavioral: Negative.   All other systems reviewed and are negative.    Physical Exam Updated Vital Signs BP (!) 181/74 (BP Location: Right Arm)   Pulse 97   Temp 98.7 F (37.1 C) (Oral)   Resp 18   Ht 5\' 10"  (1.778 m)   Wt 95.3 kg (210 lb)   SpO2 94%   BMI 30.13 kg/m   Physical Exam  Constitutional: He is oriented to person, place, and time. He  appears well-developed and well-nourished.  HENT:  Head: Normocephalic and atraumatic.  Eyes: Conjunctivae are normal. Pupils are equal, round, and reactive to light.  Neck: Neck supple. No tracheal deviation present. No thyromegaly present.  Cardiovascular: Normal rate and regular rhythm.   No murmur heard. Pulmonary/Chest: Effort normal and breath sounds normal.  Abdominal: Soft. Bowel sounds are normal. He exhibits no distension. There is no tenderness.  Musculoskeletal: Normal range of motion. He exhibits no edema or tenderness.  Neurological: He is alert and oriented to person, place, and time. Coordination normal.  Skin: Skin is warm and dry. No rash noted.  Psychiatric: He has a normal mood and affect.  Nursing note and vitals reviewed.    ED Treatments / Results  Labs (all labs ordered are listed, but only abnormal results are displayed) Labs Reviewed  COMPREHENSIVE METABOLIC PANEL - Abnormal; Notable for the following:       Result Value   Glucose, Bld 139 (*)    All other components within normal limits  LIPASE, BLOOD  CBC  I-STAT TROPOININ, ED    EKG  EKG Interpretation  Date/Time:  Friday September 17 2016 18:33:54 EDT Ventricular Rate:  85 PR Interval:    QRS Duration: 104 QT Interval:  369 QTC Calculation: 439 R Axis:   -4 Text Interpretation:  Sinus rhythm Abnormal R-wave progression, early transition Borderline repolarization abnormality No significant change since last tracing Confirmed by Orlie Dakin [phone]) on 09/17/2016 6:42:47 PM       Radiology No results found.  Procedures Procedures (including critical care time)  Medications Ordered in ED Medications - No data to display  Results for orders placed or performed during the hospital encounter of 09/17/16  Lipase, blood  Result Value Ref Range   Lipase 26 11 - 51 U/L  Comprehensive metabolic panel  Result Value Ref Range   Sodium 138 135 - 145 mmol/L   Potassium 3.9 3.5 - 5.1 mmol/L    Chloride 101 101 - 111 mmol/L   CO2 28 22 - 32 mmol/L   Glucose, Bld 139 (H) 65 - 99 mg/dL   BUN 12 6 - 20 mg/dL   Creatinine, Ser 1.05 0.61 - 1.24 mg/dL   Calcium 9.7 8.9 - 10.3 mg/dL   Total Protein 7.6 6.5 - 8.1 g/dL   Albumin 3.8 3.5 - 5.0 g/dL   AST 26 15 - 41 U/L   ALT 17 17 - 63 U/L   Alkaline Phosphatase 98 38 - 126 U/L   Total Bilirubin 1.0 0.3 - 1.2 mg/dL   GFR calc non Af Amer >60 >60 mL/min   GFR calc Af Amer >60 >60 mL/min   Anion gap 9 5 - 15  CBC  Result Value Ref Range   WBC 7.8 4.0 - 10.5 K/uL   RBC 4.58 4.22 - 5.81 MIL/uL   Hemoglobin 13.6 13.0 -  17.0 g/dL   HCT 40.8 39.0 - 52.0 %   MCV 89.1 78.0 - 100.0 fL   MCH 29.7 26.0 - 34.0 pg   MCHC 33.3 30.0 - 36.0 g/dL   RDW 13.8 11.5 - 15.5 %   Platelets 319 150 - 400 K/uL  I-stat troponin, ED  Result Value Ref Range   Troponin i, poc 0.00 0.00 - 0.08 ng/mL   Comment 3           No results found. Initial Impression / Assessment and Plan / ED Course  I have reviewed the triage vital signs and the nursing notes.  Pertinent labs & imaging results that were available during my care of the patient were reviewed by me and considered in my medical decision making (see chart for details).     Patient asymptomatic since eating in the emergency department. Plan he is encouraged to make follow-up appointment with PMD Final Clinical Impressions(s) / ED Diagnoses  Diagnosis weakness Final diagnoses:  None    New Prescriptions New Prescriptions   No medications on file     Orlie Dakin, MD 09/17/16 2111

## 2016-09-17 NOTE — Discharge Instructions (Signed)
Make sure that you drink at least six 8 ounce glasses of water each day in order to stay well-hydrated. Please schedule the next available appointment with your primary care physician

## 2016-09-17 NOTE — ED Notes (Signed)
Pt verbalized understanding of d/c instructions and has no further questions. Pt is stable, A&Ox4, VSS.  

## 2016-09-17 NOTE — ED Notes (Signed)
The pt is c/o dizziness for the past 2 days and his bp has been all over the place alert no distress  He has also had diarrhea for 2 days particularly when he eats sugar

## 2016-09-20 ENCOUNTER — Emergency Department (HOSPITAL_COMMUNITY)
Admission: EM | Admit: 2016-09-20 | Discharge: 2016-09-20 | Disposition: A | Discharge status: Home / Self Care | Payer: Medicare Other | Attending: Emergency Medicine | Admitting: Emergency Medicine

## 2016-09-20 ENCOUNTER — Encounter (HOSPITAL_COMMUNITY): Payer: Self-pay | Admitting: Emergency Medicine

## 2016-09-20 DIAGNOSIS — Z87891 Personal history of nicotine dependence: Secondary | ICD-10-CM | POA: Diagnosis not present

## 2016-09-20 DIAGNOSIS — Z79899 Other long term (current) drug therapy: Secondary | ICD-10-CM | POA: Insufficient documentation

## 2016-09-20 DIAGNOSIS — I10 Essential (primary) hypertension: Secondary | ICD-10-CM | POA: Diagnosis not present

## 2016-09-20 DIAGNOSIS — Z8673 Personal history of transient ischemic attack (TIA), and cerebral infarction without residual deficits: Secondary | ICD-10-CM | POA: Diagnosis not present

## 2016-09-20 DIAGNOSIS — E876 Hypokalemia: Secondary | ICD-10-CM | POA: Diagnosis not present

## 2016-09-20 LAB — I-STAT CHEM 8, ED
BUN: 10 mg/dL (ref 6–20)
CREATININE: 0.9 mg/dL (ref 0.61–1.24)
Calcium, Ion: 1.2 mmol/L (ref 1.15–1.40)
Chloride: 98 mmol/L — ABNORMAL LOW (ref 101–111)
GLUCOSE: 161 mg/dL — AB (ref 65–99)
HEMATOCRIT: 43 % (ref 39.0–52.0)
Hemoglobin: 14.6 g/dL (ref 13.0–17.0)
POTASSIUM: 2.9 mmol/L — AB (ref 3.5–5.1)
Sodium: 139 mmol/L (ref 135–145)
TCO2: 27 mmol/L (ref 0–100)

## 2016-09-20 LAB — CBG MONITORING, ED: Glucose-Capillary: 154 mg/dL — ABNORMAL HIGH (ref 65–99)

## 2016-09-20 MED ORDER — POTASSIUM CHLORIDE ER 20 MEQ PO TBCR: 20.0000 meq | EXTENDED_RELEASE_TABLET | Freq: Two times a day (BID) | ORAL | 0 refills | Status: DC

## 2016-09-20 MED ORDER — POTASSIUM CHLORIDE CRYS ER 20 MEQ PO TBCR
40.0000 meq | EXTENDED_RELEASE_TABLET | Freq: Once | ORAL | Status: AC
  Administered 2016-09-20: 40 meq via ORAL
  Filled 2016-09-20: qty 2

## 2016-09-20 NOTE — ED Provider Notes (Signed)
[facility] DEPT Provider Note   CSN: [id number] Arrival date & time: 09/20/16  1639     History   Chief Complaint Chief Complaint  Patient presents with  . Hypertension    HPI Steven Simmons is a 76 y.o. male.   Hypertension  This is a recurrent problem. The current episode started 3 to 5 hours ago. The problem occurs every several days. The problem has been resolved. Pertinent negatives include no chest pain, no abdominal pain, no headaches and no shortness of breath. Associated symptoms comments: States that he checks his blood pressure on a daily basis and was concerned because of his elevated at home, 160/80. Denies chest pain, shortness of breath, nausea, vomiting, diaphoresis. Denies headache or syncopal episodes. Does endorse a four-day history of nonbloody diarrhea associated with mild lightheadedness when standing.. Nothing aggravates the symptoms. Nothing relieves the symptoms. He has tried nothing for the symptoms.    Past Medical History:  Diagnosis Date  . Arthritis    shoulders   . Complication of anesthesia    " dont know the name of it ,but they gave it to me at [facility] in 1983 "   I was cold and had a head ache afterwards "  . GERD (gastroesophageal reflux disease)   . Headache   . History of TIA (transient ischemic attack)   . Hypertension   . Peptic ulcer disease     Patient Active Problem List   Diagnosis Date Noted  . Hypertensive encephalopathy   . Allergic rhinitis 04/10/2014  . Blurred vision   . Tobacco abuse   . Erectile dysfunction 12/28/2013  . HLD (hyperlipidemia) 08/11/2012  . TIA (transient ischemic attack) 06/29/2012  . HTN (hypertension) 06/29/2012    Past Surgical History:  Procedure Laterality Date  . CATARACT EXTRACTION Bilateral   . CHOLECYSTECTOMY    . COLONOSCOPY  2012 ?  [facility] [place] Medications    Prior to Admission medications   Medication Sig Start Date End Date Taking?  Authorizing Provider  lisinopril-hydrochlorothiazide (PRINZIDE,ZESTORETIC) 20-12.5 MG tablet Take 2 tablets by mouth daily. 05/03/16  Yes Haney, Alyssa A, MD  potassium chloride 20 MEQ TBCR Take 20 mEq by mouth 2 (two) times daily. 09/20/16 09/27/16  Nathaniel Man, MD    Family History Family History  Problem Relation Age of Onset  . Heart disease Mother   . Hypertension Mother   . Heart disease Father   . Hypertension Father     Social History Social History  Substance Use Topics  . Smoking status: Former Smoker    Types: Cigarettes    Quit date: 04/20/1977  . Smokeless tobacco: Never Used  . Alcohol use No     Allergies   Ceclor [cefaclor]; Veralipride; Amlodipine; Antihistamines, chlorpheniramine-type; Beta adrenergic blockers; Celebrex [celecoxib]; Ibuprofen; Lipitor [atorvastatin]; Nutrasweet aspartame [aspartame]; and Penicillins   Review of Systems Review of Systems  Constitutional: Negative for appetite change and fever.  HENT: Negative for congestion.   Eyes: Negative for visual disturbance.  Respiratory: Negative for chest tightness and shortness of breath.   Cardiovascular: Negative for chest pain and palpitations.  Gastrointestinal: Positive for diarrhea. Negative for abdominal pain, blood in stool, nausea and vomiting.  Genitourinary: Negative for decreased urine volume, dysuria and flank pain.  Musculoskeletal: Negative for back pain.  Skin: Negative for rash.  Neurological: Negative for dizziness, seizures, syncope, weakness, light-headedness and headaches.  Psychiatric/Behavioral: Negative for behavioral problems.  Physical Exam Updated Vital Signs BP (!) 154/80   Pulse 70   Temp 98.4 F (36.9 C) (Oral)   Resp 12   SpO2 98%   Physical Exam  Constitutional: He is oriented to person, place, and time. He appears well-developed and well-nourished. No distress.  HENT:  Head: Atraumatic.  Mouth/Throat: Oropharynx is clear and moist.  Eyes: EOM are  normal.  Neck: Normal range of motion.  Cardiovascular: Normal rate, regular rhythm, normal heart sounds and intact distal pulses.   Pulmonary/Chest: Effort normal and breath sounds normal. No respiratory distress.  Abdominal: Soft. He exhibits no distension. There is no tenderness.  Musculoskeletal: Normal range of motion. He exhibits no edema.  No unilateral leg swelling  Neurological: He is alert and oriented to person, place, and time.  Skin: Skin is warm. Capillary refill takes less than 2 seconds.  Psychiatric: He has a normal mood and affect.     ED Treatments / Results  Labs (all labs ordered are listed, but only abnormal results are displayed) Labs Reviewed  I-STAT CHEM 8, ED - Abnormal; Notable for the following:       Result Value   Potassium 2.9 (*)    Chloride 98 (*)    Glucose, Bld 161 (*)    All other components within normal limits  CBG MONITORING, ED - Abnormal; Notable for the following:    Glucose-Capillary 154 (*)    All other components within normal limits    EKG  EKG Interpretation None       Radiology No results found.  Procedures Procedures (including critical care time)  Medications Ordered in ED Medications  potassium chloride SA (K-DUR,KLOR-CON) CR tablet 40 mEq (40 mEq Oral Given 09/20/16 2244)     Initial Impression / Assessment and Plan / ED Course  I have reviewed the triage vital signs and the nursing notes.  Pertinent labs & imaging results that were available during my care of the patient were reviewed by me and considered in my medical decision making (see chart for details).     Patient has a history of hypertension, presents to the emergency department concerned for hypertension. Asymptomatic. Blood pressure at home 160s/80s. Endorses some noncompliance with his blood pressure medications. No acute distress. Blood pressure in the emergency department 140/80s.  EKG showed nonspecific T-wave inversion in aVF. Normal intervals,  no chamber enlargement, no signs of acute ischemia. No change when compared to prior EKG. Glucose 154. Electrolytes significant for potassium 2.9. Likely secondary to HCTZ use. By mouth potassium. Patient has no signs or symptoms of ACS, PE, dissection. Normotensive in the emergency department.  Patient stable for discharge home. Given a prescription for potassium supplements. Discussed follow-up with the primary care physician in the next 1 week for reevaluation. Given strict return precautions to the emergency department. Patient expressed understanding. Final Clinical Impressions(s) / ED Diagnoses   Final diagnoses:  Hypokalemia  Hypertension, unspecified type    New Prescriptions Discharge Medication List as of 09/20/2016 10:44 PM    START taking these medications   Details  potassium chloride 20 MEQ TBCR Take 20 mEq by mouth 2 (two) times daily., Starting Mon 09/20/2016, Until Mon 09/27/2016, Print         Nathaniel Man, MD 09/21/16 0040    Merrily Pew, MD 09/22/16 2035

## 2016-09-20 NOTE — ED Triage Notes (Signed)
Pt has been having BP fluctuations all weekend. Pt states he was seen here and was told to see his regular physician. Pt states today his BP was 161/77 at it's highest. Pt took his BP medications today as prescribed. Pt states after he takes his lisinopril, he feels "drunk and dizzy."

## 2016-09-24 ENCOUNTER — Ambulatory Visit (INDEPENDENT_AMBULATORY_CARE_PROVIDER_SITE_OTHER): Payer: Medicare Other | Admitting: Student

## 2016-09-24 ENCOUNTER — Encounter: Payer: Self-pay | Admitting: Student

## 2016-09-24 VITALS — BP 138/68 | HR 91 | Temp 98.5°F | Ht 70.0 in | Wt 217.0 lb

## 2016-09-24 DIAGNOSIS — E876 Hypokalemia: Secondary | ICD-10-CM | POA: Insufficient documentation

## 2016-09-24 DIAGNOSIS — I10 Essential (primary) hypertension: Secondary | ICD-10-CM | POA: Diagnosis not present

## 2016-09-24 MED ORDER — POTASSIUM CHLORIDE ER 20 MEQ PO TBCR: 20.0000 meq | EXTENDED_RELEASE_TABLET | Freq: Two times a day (BID) | ORAL | 2 refills | Status: DC

## 2016-09-24 NOTE — Patient Instructions (Signed)
Follow up in 1 month for blood pressure, potassium check Call the office with questions or concerns

## 2016-09-24 NOTE — Assessment & Plan Note (Signed)
Continue lisinopril

## 2016-09-24 NOTE — Progress Notes (Signed)
   Subjective:    Patient ID: Steven Simmons, male    DOB: January 11, 1941, 76 y.o.   MRN: [id number]   CC: ED follow up for hypokalemia  HPI: 76 y/o M with HTN presents doe ED follow up for hypokalemia  Hypokalemia - K was 2.9 in the ED with no EKG changes - he as given K in the ED and a prescription to take at home - he is on lisinopril-HCTZ for HTN  HTN - reports compliance with Prinzide - no chest pain or SOB  Smoking status reviewed  Review of Systems  Per HPI, else denies recent illness, fever   Objective:  BP 138/68   Pulse 91   Temp 98.5 F (36.9 C) (Oral)   Ht 5\' 10"  (1.778 m)   Wt 217 lb (98.4 kg)   SpO2 96%   BMI 31.14 kg/m  Vitals and nursing note reviewed  General: NAD Cardiac: RRR Respiratory: CTAB, normal effort Skin: warm and dry, no rashes noted Neuro: alert and oriented, no focal deficits   Assessment & Plan:    Hypokalemia K 2.9 in the ED now s/p Kdur 40 in the ED and given a script for [place] outpatient. He has not started this yet - BMP today - will continue Kdur given he is on lisinopril - follow in one month for BP and K check  HTN (hypertension) Continue lisinopril    Breigh Annett A. Lincoln Brigham MD, [facility] Resident PGY-3 Pager [phone]

## 2016-09-24 NOTE — Assessment & Plan Note (Signed)
K 2.9 in the ED now s/p Kdur 40 in the ED and given a script for [place] outpatient. He has not started this yet - BMP today - will continue Kdur given he is on lisinopril - follow in one month for BP and K check

## 2016-09-25 LAB — BASIC METABOLIC PANEL
BUN/Creatinine Ratio: 15 (ref 10–24)
BUN: 14 mg/dL (ref 8–27)
CALCIUM: 9.7 mg/dL (ref 8.6–10.2)
CHLORIDE: 101 mmol/L (ref 96–106)
CO2: 23 mmol/L (ref 20–29)
Creatinine, Ser: 0.94 mg/dL (ref 0.76–1.27)
GFR calc Af Amer: 92 mL/min/{1.73_m2} (ref >59)
GFR calc non Af Amer: 80 mL/min/{1.73_m2} (ref >59)
GLUCOSE: 124 mg/dL — AB (ref 65–99)
POTASSIUM: 3.6 mmol/L (ref 3.5–5.2)
Sodium: 142 mmol/L (ref 134–144)

## 2016-12-09 ENCOUNTER — Other Ambulatory Visit: Payer: Self-pay | Admitting: Family Medicine

## 2016-12-09 DIAGNOSIS — I10 Essential (primary) hypertension: Secondary | ICD-10-CM

## 2016-12-09 MED ORDER — LISINOPRIL-HYDROCHLOROTHIAZIDE 20-12.5 MG PO TABS: 2.0000 | ORAL_TABLET | Freq: Every day | ORAL | 0 refills | Status: DC

## 2016-12-09 NOTE — Telephone Encounter (Signed)
Vm left informing pt of 1 month refill. Pt was informed to call and schedule fu apt with pcp for any additional refills.

## 2016-12-09 NOTE — Telephone Encounter (Signed)
Rx refilled for 30 day supply. Patient needs follow up for BP recheck and K recheck.

## 2016-12-10 ENCOUNTER — Telehealth: Payer: Self-pay

## 2016-12-10 ENCOUNTER — Other Ambulatory Visit: Payer: Self-pay | Admitting: Family Medicine

## 2016-12-10 DIAGNOSIS — I10 Essential (primary) hypertension: Secondary | ICD-10-CM

## 2016-12-10 NOTE — Telephone Encounter (Signed)
Steven Simmons would like a refill of the following medications:     lisinopril-hydrochlorothiazide (PRINZIDE,ZESTORETIC) 20-12.5 MG tablet Steven Lope, DO]    Preferred pharmacy: [facility] [place], [place] - [address]    Comment:    Pending    Disp Refills Start End  lisinopril-hydrochlorothiazide (PRINZIDE,ZESTORETIC) 20-12.5 MG tablet 60 tablet 0 12/10/2016   Sig - Route:  Take 2 tablets by mouth daily. - Oral  DAW:  No

## 2016-12-10 NOTE — Telephone Encounter (Signed)
Sent in electronically yesterday and per chart review patient was informed of need for follow appt for future refills

## 2016-12-13 ENCOUNTER — Telehealth: Payer: Self-pay | Admitting: Family Medicine

## 2016-12-13 MED ORDER — LISINOPRIL-HYDROCHLOROTHIAZIDE 20-12.5 MG PO TABS: 2.0000 | ORAL_TABLET | Freq: Every day | ORAL | 0 refills | Status: DC

## 2016-12-17 ENCOUNTER — Ambulatory Visit: Payer: Medicare Other | Admitting: Family Medicine

## 2017-01-14 ENCOUNTER — Ambulatory Visit: Payer: Medicare Other | Admitting: Family Medicine

## 2017-01-20 ENCOUNTER — Ambulatory Visit (INDEPENDENT_AMBULATORY_CARE_PROVIDER_SITE_OTHER): Payer: Medicare Other | Admitting: Family Medicine

## 2017-01-20 ENCOUNTER — Encounter: Payer: Self-pay | Admitting: Family Medicine

## 2017-01-20 VITALS — BP 150/78 | HR 103 | Temp 97.8°F | Ht 70.0 in | Wt 221.0 lb

## 2017-01-20 DIAGNOSIS — E785 Hyperlipidemia, unspecified: Secondary | ICD-10-CM

## 2017-01-20 DIAGNOSIS — Z23 Encounter for immunization: Secondary | ICD-10-CM

## 2017-01-20 DIAGNOSIS — I10 Essential (primary) hypertension: Secondary | ICD-10-CM

## 2017-01-20 MED ORDER — TETANUS-DIPHTH-ACELL PERTUSSIS 5-2.5-18.5 LF-MCG/0.5 IM SUSP: 0.5000 mL | Freq: Once | INTRAMUSCULAR | 0 refills | Status: AC

## 2017-01-20 MED ORDER — PNEUMOCOCCAL VAC POLYVALENT 25 MCG/0.5ML IJ INJ: 0.5000 mL | INJECTION | INTRAMUSCULAR | 0 refills | Status: DC

## 2017-01-20 NOTE — Patient Instructions (Addendum)
It was good to see you today!  For your blood pressure, - We are checking some labs today to recheck your potassium. If results require attention, either myself or my nurse will get in touch with you. If everything is normal, you will get a letter in the mail or a message in My Chart. Please give [place] a call if you do not hear from [place] after 2 weeks. - Continue your current medications, at your age your blood pressure goal is under 150/90  For your general health - I have sent the tetanus booster to your pharmacy in case you ever need it.   Please check-out at the front desk before leaving the clinic. Make an appointment at your convenience for a Medicare Annual Wellness visit.    Please bring all of your medications with you to each visit.   Sign up for My Chart to have easy access to your labs results, and communication with your primary care physician.  Feel free to call with any questions or concerns at any time, at [phone].   Take care,  Dr. Bufford Lope, [place]

## 2017-01-20 NOTE — Progress Notes (Signed)
    Subjective:  Steven Simmons is a 76 y.o. male who presents to the [facility] today for HTN follow up  HPI:   Hypertension  - taking medications as instructed, no medication side effects noted. Is on prinzide 40-25mg  daily and was started on Kdur in June due to his persistently low potassium - home BP monitoring in range of 944-967R'F systolic over 16B'W diastolic - no TIA's, no chest pain on exertion, no dyspnea on exertion, no swelling of ankles, no orthostatic dizziness or lightheadedness, noting episodes of paroxysmal nocturnal dyspnea and no palpitations.    ROS: Per HPI  Objective:  Physical Exam: BP (!) 150/78   Pulse (!) 103   Temp 97.8 F (36.6 C) (Oral)   Ht 5\' 10"  (1.778 m)   Wt 221 lb (100.2 kg)   BMI 31.71 kg/m   Gen: NAD, resting comfortably CV: RRR with no murmurs appreciated Pulm: NWOB, CTAB with no crackles, wheezes, or rhonchi GI: Normal bowel sounds present. Soft, Nontender, Nondistended. MSK: no edema, cyanosis, or clubbing noted Skin: warm, dry Neuro: grossly normal, moves all extremities Psych: Normal affect and thought content   Assessment/Plan:  HTN (hypertension) On recheck today, patient is at goal for his age range which is <150/90. No changes to medications today. Will recheck BMP to ensure that K is within normal range on Kdur supplementation. Follow up in 6 months.  Health Maintenance - PCV23 given today - patient refused tetanus booster due to cost, was informed that sent to pharmacy and may be either free or very little cost given his insurance and to double check - patient advised to scheduled Medicare AWV  Bufford Lope, DO PGY-2, [facility] 01/20/2017 3:10 PM

## 2017-01-21 ENCOUNTER — Other Ambulatory Visit: Payer: Self-pay | Admitting: Family Medicine

## 2017-01-21 ENCOUNTER — Encounter: Payer: Self-pay | Admitting: Family Medicine

## 2017-01-21 DIAGNOSIS — I10 Essential (primary) hypertension: Secondary | ICD-10-CM

## 2017-01-21 LAB — BASIC METABOLIC PANEL
BUN/Creatinine Ratio: 12 (ref 10–24)
BUN: 11 mg/dL (ref 8–27)
CALCIUM: 9.9 mg/dL (ref 8.6–10.2)
CHLORIDE: 98 mmol/L (ref 96–106)
CO2: 22 mmol/L (ref 20–29)
Creatinine, Ser: 0.95 mg/dL (ref 0.76–1.27)
GFR calc Af Amer: 91 mL/min/{1.73_m2} (ref >59)
GFR calc non Af Amer: 79 mL/min/{1.73_m2} (ref >59)
GLUCOSE: 132 mg/dL — AB (ref 65–99)
POTASSIUM: 3.7 mmol/L (ref 3.5–5.2)
Sodium: 139 mmol/L (ref 134–144)

## 2017-01-21 MED ORDER — LISINOPRIL-HYDROCHLOROTHIAZIDE 20-12.5 MG PO TABS: 2.0000 | ORAL_TABLET | Freq: Every day | ORAL | 3 refills | Status: DC

## 2017-01-21 NOTE — Telephone Encounter (Signed)
Medication sent into pharmacy since provider is post call and also patient will be out tomorrow. Jazmin Hartsell,CMA

## 2017-01-22 NOTE — Assessment & Plan Note (Signed)
On recheck today, patient is at goal for his age range which is <150/90. No changes to medications today. Will recheck BMP to ensure that K is within normal range on Kdur supplementation. Follow up in 6 months.

## 2017-01-24 ENCOUNTER — Encounter: Payer: Self-pay | Admitting: Family Medicine

## 2017-02-09 DIAGNOSIS — Z23 Encounter for immunization: Secondary | ICD-10-CM | POA: Diagnosis not present

## 2017-06-02 ENCOUNTER — Encounter: Payer: Self-pay | Admitting: Family Medicine

## 2017-06-02 DIAGNOSIS — I10 Essential (primary) hypertension: Secondary | ICD-10-CM

## 2017-06-03 MED ORDER — LISINOPRIL-HYDROCHLOROTHIAZIDE 20-12.5 MG PO TABS: 2.0000 | ORAL_TABLET | Freq: Every day | ORAL | 3 refills | Status: DC

## 2017-08-03 ENCOUNTER — Encounter (HOSPITAL_COMMUNITY): Payer: Self-pay | Admitting: Emergency Medicine

## 2017-08-03 ENCOUNTER — Ambulatory Visit (HOSPITAL_COMMUNITY)
Admission: EM | Admit: 2017-08-03 | Discharge: 2017-08-03 | Disposition: A | Discharge status: Home / Self Care | Payer: Medicare Other | Attending: Family Medicine | Admitting: Family Medicine

## 2017-08-03 DIAGNOSIS — B370 Candidal stomatitis: Secondary | ICD-10-CM | POA: Diagnosis present

## 2017-08-03 MED ORDER — FLUCONAZOLE 200 MG PO TABS: 200.0000 mg | ORAL_TABLET | Freq: Every day | ORAL | 0 refills | Status: AC

## 2017-08-03 MED ORDER — CLOTRIMAZOLE 10 MG MT TROC: 10.0000 mg | Freq: Every day | OROMUCOSAL | 0 refills | Status: DC

## 2017-08-03 NOTE — Discharge Instructions (Addendum)
Take fluconazole as prescribed and to completion Take clotrimazole as prescribed and to completion Perform salt-water rinses daily Drink cold liquids, like water and iced tea. Eat frozen ice pops or frozen juices. Eat foods that are easy to swallow, like gelatin and ice cream. Drink from a straw if the patches in your mouth are painful. Follow up with PCP next week for reevaluation and consider ENT referral Return or go to ER if you have any new or worsening symptoms

## 2017-08-03 NOTE — ED Triage Notes (Signed)
Pt states "Saturday I had acid reflux and it burned my throat, I sprayed it with mouth wash" pt states it hasn't gotten better. Pt also c/o R ear pain.

## 2017-08-03 NOTE — ED Provider Notes (Addendum)
[facility]    CSN: [id number] Arrival date & time: 08/03/17  1514     History   Chief Complaint Chief Complaint  Patient presents with  . Sore Throat    HPI Steven Simmons is a 77 y.o. male.   complains of improving sore throat that began 4 days.  It began after experiencing an episode of acid reflux. He describes the pain as constant and burning.  He has tried chloraseptic tablets with temporary relief.  His symptoms are made worse when he swallows.  He reports being treated for acid reflux in the past, in which his symptoms improved.       Past Medical History:  Diagnosis Date  . Arthritis    shoulders   . Complication of anesthesia    " dont know the name of it ,but they gave it to me at [facility] in 1983 "   I was cold and had a head ache afterwards "  . GERD (gastroesophageal reflux disease)   . Headache   . History of TIA (transient ischemic attack)   . Hypertension   . Peptic ulcer disease     Patient Active Problem List   Diagnosis Date Noted  . Hypokalemia 09/24/2016  . Erectile dysfunction 12/28/2013  . HLD (hyperlipidemia) 08/11/2012  . HTN (hypertension) 06/29/2012    Past Surgical History:  Procedure Laterality Date  . CATARACT EXTRACTION Bilateral   . CHOLECYSTECTOMY    . COLONOSCOPY  2012 ?  [facility] [place] Medications    Prior to Admission medications   Medication Sig Start Date End Date Taking? Authorizing Provider  clotrimazole (MYCELEX) 10 MG troche Take 1 tablet (10 mg total) by mouth 5 (five) times daily for 14 days. 08/03/17 08/17/17  Susana Duell, Marye Round, PA-C  fluconazole (DIFLUCAN) 200 MG tablet Take 1 tablet (200 mg total) by mouth daily for 7 days. 08/03/17 08/10/17  Danice Dippolito, [place], PA-C  lisinopril-hydrochlorothiazide (PRINZIDE,ZESTORETIC) 20-12.5 MG tablet Take 2 tablets by mouth daily. 06/03/17   Bufford Lope, DO  Potassium Chloride ER 20 MEQ TBCR Take 20 mEq by mouth 2 (two) times daily. Patient not  taking: Reported on 08/03/2017 09/24/16 10/01/16  Veatrice Bourbon, MD    Family History Family History  Problem Relation Age of Onset  . Heart disease Mother   . Hypertension Mother   . Heart disease Father   . Hypertension Father     Social History Social History   Tobacco Use  . Smoking status: Former Smoker    Types: Cigarettes    Last attempt to quit: 04/20/1977    Years since quitting: 40.3  . Smokeless tobacco: Never Used  Substance Use Topics  . Alcohol use: No  . Drug use: No     Allergies   Ceclor [cefaclor]; Veralipride; Amlodipine; Antihistamines, chlorpheniramine-type; Beta adrenergic blockers; Celebrex [celecoxib]; Ibuprofen; Lipitor [atorvastatin]; Nutrasweet aspartame [aspartame]; and Penicillins   Review of Systems Review of Systems  Constitutional: Negative for appetite change, chills, fatigue, fever and unexpected weight change.  HENT: Positive for sore throat.   Respiratory: Negative for shortness of breath.   Cardiovascular: Negative for chest pain.  Gastrointestinal: Negative for abdominal distention, abdominal pain, constipation, diarrhea, nausea and vomiting.  Neurological: Negative for light-headedness.     Physical Exam Triage Vital Signs ED Triage Vitals [08/03/17 1535]  Enc Vitals Group     BP (!) 160/93     Pulse Rate (!) 106  Resp 16     Temp 99.2 F (37.3 C)     Temp src      SpO2 98 %     Weight      Height      Head Circumference      Peak Flow      Pain Score      Pain Loc      Pain Edu?      Excl. in [place]?    No data found.  Updated Vital Signs BP (!) 160/93   Pulse (!) 106   Temp 99.2 F (37.3 C)   Resp 16   SpO2 98%   Physical Exam  Constitutional: He is oriented to person, place, and time. No distress.  HENT:  Head: Normocephalic and atraumatic.  Right Ear: External ear normal.  Left Ear: External ear normal.  Nose: Nose normal.  Mouth/Throat: Uvula is midline. He does not have dentures. Abnormal dentition  (Poor dentition.  ). Dental caries present. No posterior oropharyngeal edema or posterior oropharyngeal erythema.    Eyes: Pupils are equal, round, and reactive to light. EOM are normal. No scleral icterus.  Neck: Normal range of motion. Neck supple.  Cardiovascular: Normal rate, regular rhythm and normal heart sounds. Exam reveals no gallop and no friction rub.  No murmur heard. Radial pulse 2+ bilaterally    Pulmonary/Chest: Effort normal and breath sounds normal. No stridor. No respiratory distress. He has no wheezes. He has no rales.  Abdominal: Soft. Bowel sounds are normal. There is no tenderness. There is no guarding.  Protuberant  Lymphadenopathy:    He has no cervical adenopathy.  Neurological: He is alert and oriented to person, place, and time.  Skin: Skin is warm and dry. Capillary refill takes 2 to 3 seconds. He is not diaphoretic.  Psychiatric: He has a normal mood and affect. His behavior is normal. Judgment and thought content normal.  Nursing note and vitals reviewed.      UC Treatments / Results  Labs (all labs ordered are listed, but only abnormal results are displayed) Labs Reviewed - No data to display  EKG None  Radiology No results found.  Procedures Procedures (including critical care time)  Medications Ordered in UC Medications - No data to display  Initial Impression / Assessment and Plan / UC Course  I have reviewed the triage vital signs and the nursing notes.  Pertinent labs & imaging results that were available during my care of the patient were reviewed by me and considered in my medical decision making (see chart for details).   Hx and PE consistent with oropharyngeal candidiasis.  Prescribed fluconazole and clotrimazole.  Patient instructed to follow up with PCP next week for further evaluation and management.  Return and ER precautions given.    Final Clinical Impressions(s) / UC Diagnoses   Final diagnoses:  Oropharyngeal  candidiasis     Discharge Instructions     Take fluconazole as prescribed and to completion Use clotrimazole as prescribed and to completion Perform salt-water rinses daily Drink cold liquids, like water and iced tea. Eat frozen ice pops or frozen juices. Eat foods that are easy to swallow, like gelatin and ice cream. Drink from a straw if the patches in your mouth are painful. Follow up with PCP next week for reevaluation and consider ENT referral Return or go to ER if you have any new or worsening symptoms     ED Prescriptions    Medication Sig Dispense Auth. Provider  fluconazole (DIFLUCAN) 200 MG tablet Take 1 tablet (200 mg total) by mouth daily for 7 days. 10 tablet Gabriella Woodhead, [place], PA-C   clotrimazole (MYCELEX) 10 MG troche Take 1 tablet (10 mg total) by mouth 5 (five) times daily for 14 days. 70 tablet Winfred Redel, [place], PA-C     Controlled Substance Prescriptions [place] Controlled Substance Registry consulted? Not Applicable   Lestine Box, PA-C 08/03/17 [place], [place], [place] 08/03/17 1652

## 2017-08-12 ENCOUNTER — Other Ambulatory Visit: Payer: Self-pay

## 2017-08-12 ENCOUNTER — Ambulatory Visit (HOSPITAL_COMMUNITY)
Admission: EM | Admit: 2017-08-12 | Discharge: 2017-08-12 | Disposition: A | Discharge status: Home / Self Care | Payer: Medicare Other | Source: Home / Self Care | Attending: Family Medicine | Admitting: Family Medicine

## 2017-08-12 ENCOUNTER — Encounter (HOSPITAL_COMMUNITY): Payer: Self-pay | Admitting: Emergency Medicine

## 2017-08-12 ENCOUNTER — Emergency Department (HOSPITAL_COMMUNITY)
Admission: EM | Admit: 2017-08-12 | Discharge: 2017-08-13 | Disposition: A | Discharge status: Home / Self Care | Payer: Medicare Other | Attending: Emergency Medicine | Admitting: Emergency Medicine

## 2017-08-12 ENCOUNTER — Emergency Department (HOSPITAL_COMMUNITY): Payer: Medicare Other

## 2017-08-12 ENCOUNTER — Encounter (HOSPITAL_COMMUNITY): Payer: Self-pay | Admitting: Family Medicine

## 2017-08-12 DIAGNOSIS — I10 Essential (primary) hypertension: Secondary | ICD-10-CM | POA: Insufficient documentation

## 2017-08-12 DIAGNOSIS — R066 Hiccough: Secondary | ICD-10-CM | POA: Insufficient documentation

## 2017-08-12 DIAGNOSIS — Z79899 Other long term (current) drug therapy: Secondary | ICD-10-CM | POA: Diagnosis not present

## 2017-08-12 DIAGNOSIS — B370 Candidal stomatitis: Secondary | ICD-10-CM | POA: Diagnosis not present

## 2017-08-12 DIAGNOSIS — R110 Nausea: Secondary | ICD-10-CM

## 2017-08-12 DIAGNOSIS — R0602 Shortness of breath: Secondary | ICD-10-CM | POA: Diagnosis not present

## 2017-08-12 LAB — POCT I-STAT, CHEM 8
BUN: 15 mg/dL (ref 6–20)
CALCIUM ION: 1.18 mmol/L (ref 1.15–1.40)
CREATININE: 0.9 mg/dL (ref 0.61–1.24)
Chloride: 95 mmol/L — ABNORMAL LOW (ref 101–111)
Glucose, Bld: 109 mg/dL — ABNORMAL HIGH (ref 65–99)
HCT: 41 % (ref 39.0–52.0)
HEMOGLOBIN: 13.9 g/dL (ref 13.0–17.0)
POTASSIUM: 3.3 mmol/L — AB (ref 3.5–5.1)
Sodium: 134 mmol/L — ABNORMAL LOW (ref 135–145)
TCO2: 26 mmol/L (ref 22–32)

## 2017-08-12 LAB — CBC
HEMATOCRIT: 38.9 % — AB (ref 39.0–52.0)
HEMOGLOBIN: 13.6 g/dL (ref 13.0–17.0)
MCH: 30.3 pg (ref 26.0–34.0)
MCHC: 35 g/dL (ref 30.0–36.0)
MCV: 86.6 fL (ref 78.0–100.0)
Platelets: 398 10*3/uL (ref 150–400)
RBC: 4.49 MIL/uL (ref 4.22–5.81)
RDW: 13.6 % (ref 11.5–15.5)
WBC: 11.2 10*3/uL — ABNORMAL HIGH (ref 4.0–10.5)

## 2017-08-12 LAB — BASIC METABOLIC PANEL
ANION GAP: 11 (ref 5–15)
BUN: 14 mg/dL (ref 6–20)
CHLORIDE: 98 mmol/L — AB (ref 101–111)
CO2: 25 mmol/L (ref 22–32)
Calcium: 9.5 mg/dL (ref 8.9–10.3)
Creatinine, Ser: 0.98 mg/dL (ref 0.61–1.24)
GFR calc Af Amer: >60 mL/min (ref >60)
GFR calc non Af Amer: >60 mL/min (ref >60)
Glucose, Bld: 110 mg/dL — ABNORMAL HIGH (ref 65–99)
POTASSIUM: 3.2 mmol/L — AB (ref 3.5–5.1)
Sodium: 134 mmol/L — ABNORMAL LOW (ref 135–145)

## 2017-08-12 LAB — I-STAT TROPONIN, ED: Troponin i, poc: 0 ng/mL (ref 0.00–0.08)

## 2017-08-12 MED ORDER — ONDANSETRON HCL 4 MG PO TABS: 4.0000 mg | ORAL_TABLET | Freq: Three times a day (TID) | ORAL | 0 refills | Status: AC | PRN

## 2017-08-12 MED ORDER — NYSTATIN 100000 UNIT/ML MT SUSP: 500000.0000 [IU] | Freq: Four times a day (QID) | OROMUCOSAL | 0 refills | Status: AC

## 2017-08-12 NOTE — ED Triage Notes (Signed)
Pt to ED c/o "SOB attack" about 15 minutes ago - reports he was seen at [facility] a couple hours ago and was told to go to ED if he had same symptoms or if things got worse. Patient has hx peptic ulcer disease and GERD, but reports this sensation was new - states he felt like he had to belch, but couldn't and felt a pressure in his upper abdomen/epigastrium that caused brief, but severe shortness of breath. Patient states it quickly went away and he hasn't had it since but wants to make sure everything is okay. Resp currently e/u, skin warm/dry.

## 2017-08-12 NOTE — ED Provider Notes (Signed)
[facility]    CSN: [id number] Arrival date & time: 08/12/17  1641     History   Chief Complaint Chief Complaint  Patient presents with  . Hiccups  . Nausea    HPI Steven Simmons is a 77 y.o. male.   HPI Patient was seen few days ago for oropharyngeal thrush and was sent home on Diflucan and Clotrimazole. However, he stated he never picked up his Clotrimazole and had completed 6 doses of his Diflucan. He stated he was supposed to take the diflucan for 7 days, however, the next day after he started the medication, he developed nausea and hiccupping without vomiting. He denies stomach pain, no blood in his stool. He endorsed watery stool 2 days ago which later became solid. He is concern something is wrong with his stomach and suggested that xray might help determine the cause of his hiccups. No other concern. Past Medical History:  Diagnosis Date  . Arthritis    shoulders   . Complication of anesthesia    " dont know the name of it ,but they gave it to me at [facility] long in 1983 "   I was cold and had a head ache afterwards "  . GERD (gastroesophageal reflux disease)   . Headache   . History of TIA (transient ischemic attack)   . Hypertension   . Peptic ulcer disease     Patient Active Problem List   Diagnosis Date Noted  . Hypokalemia 09/24/2016  . Erectile dysfunction 12/28/2013  . HLD (hyperlipidemia) 08/11/2012  . HTN (hypertension) 06/29/2012    Past Surgical History:  Procedure Laterality Date  . CATARACT EXTRACTION Bilateral   . CHOLECYSTECTOMY    . COLONOSCOPY  2012 ?  Marland Kitchen Shaft Medications    Prior to Admission medications   Medication Sig Start Date End Date Taking? Authorizing Provider  clotrimazole (MYCELEX) 10 MG troche Take 1 tablet (10 mg total) by mouth 5 (five) times daily for 14 days. 08/03/17 08/17/17  Wurst, [place], PA-C  lisinopril-hydrochlorothiazide (PRINZIDE,ZESTORETIC) 20-12.5 MG tablet Take 2  tablets by mouth daily. 06/03/17   Bufford Lope, DO  Potassium Chloride ER 20 MEQ TBCR Take 20 mEq by mouth 2 (two) times daily. Patient not taking: Reported on 08/03/2017 09/24/16 10/01/16  Veatrice Bourbon, MD    Family History Family History  Problem Relation Age of Onset  . Heart disease Mother   . Hypertension Mother   . Heart disease Father   . Hypertension Father     Social History Social History   Tobacco Use  . Smoking status: Former Smoker    Types: Cigarettes    Last attempt to quit: 04/20/1977    Years since quitting: 40.3  . Smokeless tobacco: Never Used  Substance Use Topics  . Alcohol use: No  . Drug use: No     Allergies   Ceclor [cefaclor]; Veralipride; Amlodipine; Antihistamines, chlorpheniramine-type; Beta adrenergic blockers; Celebrex [celecoxib]; Ibuprofen; Lipitor [atorvastatin]; Nutrasweet aspartame [aspartame]; and Penicillins   Review of Systems Review of Systems  Constitutional: Negative for fatigue and fever.  Respiratory: Negative.   Cardiovascular: Negative.   Gastrointestinal: Positive for nausea. Negative for abdominal distention, abdominal pain, blood in stool, constipation and vomiting.       Hiccups   Neurological: Negative.   All other systems reviewed and are negative.    Physical Exam Triage Vital Signs ED Triage Vitals [08/12/17 1701]  Enc Vitals Group  BP 124/77     Pulse Rate 90     Resp 18     Temp 98.8 F (37.1 C)     Temp src      SpO2 100 %     Weight      Height      Head Circumference      Peak Flow      Pain Score      Pain Loc      Pain Edu?      Excl. in [place]?    No data found.    Updated Vital Signs BP 124/77   Pulse 90   Temp 98.8 F (37.1 C)   Resp 18   SpO2 100%   Visual Acuity Right Eye Distance:   Left Eye Distance:   Bilateral Distance:    Right Eye Near:   Left Eye Near:    Bilateral Near:     Physical Exam  Constitutional: He appears well-nourished. No distress.  HENT:  Right  Ear: Ear canal normal.  Left Ear: Ear canal normal.  Ears:  Mouth/Throat: No oropharyngeal exudate or posterior oropharyngeal edema.    Neck: Neck supple.  Cardiovascular: Normal rate and regular rhythm.  No murmur heard. Pulmonary/Chest: Effort normal and breath sounds normal. No stridor. No respiratory distress.  Abdominal: Soft. Bowel sounds are normal. He exhibits no distension and no mass. There is no tenderness. There is no rebound and no guarding.  Vitals reviewed.    UC Treatments / Results  Labs (all labs ordered are listed, but only abnormal results are displayed) Labs Reviewed - No data to display  EKG None  Radiology No results found.  Procedures Procedures (including critical care time)  Medications Ordered in UC Medications - No data to display  Initial Impression / Assessment and Plan / UC Course  I have reviewed the triage vital signs and the nursing notes.  Pertinent labs & imaging results that were available during my care of the patient were reviewed by me and considered in my medical decision making (see chart for details).  Clinical Course as of Aug 13 1754  Fri Aug 12, 2017  1731 Nausea: Likely medication induced. He already stopped his Diflucan after completing 6 days treatment. Will give Zofran prn for few days.    [KE]  1736 Hiccups: Not typical of Diflucan's S/E although he may present with different GI symptoms. Hiccups persists on and off despite being off med. I recommended giving it time. Avoid trigger. Check electrolyte. Xray might not show a whole lot. If this persist I recommended ED visit for Imaging. F/U with PCP in two days for reassessment.   NB: Bmet with mildly low potassium. Will give KDur for few days. Repeat Bmet in 1 week.  NB: Patient declined potassium. He stated it messes up his GI. He stated he uses OTC potassium. I recommended PCP f/u soon for management of his hypokalemia. He verbalized understanding.      [KE]  1741 Oropharyngeal Thrush. No whitish patch at the back of his throat. I am uncertain if the brown patch on his tongue is due to milk shake or thrush since I was unable to scrap iit off. Will give Nystatin   [KE]    Clinical Course User Index [KE] Kinnie Feil, MD     Final Clinical Impressions(s) / UC Diagnoses   Final diagnoses:  None   Discharge Instructions   None    ED Prescriptions    None  Nausea without vomiting  Oral thrush  Hiccup   Controlled Substance Prescriptions [place] Controlled Substance Registry consulted? Not Applicable   Kinnie Feil, MD 08/12/17 2006

## 2017-08-12 NOTE — ED Triage Notes (Signed)
Pt here for nausea, trouble belching and hiccups since using a medication that was prescribed here clotrimazole. sts that he has had some constipation and diarrhea.

## 2017-08-12 NOTE — Discharge Instructions (Addendum)
It was nice seeing you today. I am sorry about your symptoms.  Your lab shows mildly low potassium,. We will replace it. If hiccups persist, please go to the ED for imaging.  See your PCP in two days for repeat lab. Have PCP check your Liver enzymes too if symptoms persists or worsens.  Call if you have any question.  NB: Please have your PCP recheck your potassium. I have deleted potassium prescription since you declined it.

## 2017-08-13 LAB — HEPATIC FUNCTION PANEL
ALBUMIN: 3.7 g/dL (ref 3.5–5.0)
ALK PHOS: 87 U/L (ref 38–126)
ALT: 19 U/L (ref 17–63)
AST: 31 U/L (ref 15–41)
Bilirubin, Direct: 0.3 mg/dL (ref 0.1–0.5)
Indirect Bilirubin: 0.8 mg/dL (ref 0.3–0.9)
TOTAL PROTEIN: 7.5 g/dL (ref 6.5–8.1)
Total Bilirubin: 1.1 mg/dL (ref 0.3–1.2)

## 2017-08-13 LAB — I-STAT TROPONIN, ED: Troponin i, poc: 0.01 ng/mL (ref 0.00–0.08)

## 2017-08-13 LAB — HIV ANTIBODY (ROUTINE TESTING W REFLEX): HIV SCREEN 4TH GENERATION: NONREACTIVE

## 2017-08-13 LAB — LIPASE, BLOOD: LIPASE: 32 U/L (ref 11–51)

## 2017-08-13 MED ORDER — PANTOPRAZOLE SODIUM 40 MG PO TBEC: 40.0000 mg | DELAYED_RELEASE_TABLET | Freq: Every day | ORAL | 1 refills | Status: DC

## 2017-08-13 MED ORDER — PANTOPRAZOLE SODIUM 40 MG PO TBEC
40.0000 mg | DELAYED_RELEASE_TABLET | Freq: Once | ORAL | Status: AC
  Administered 2017-08-13: 40 mg via ORAL
  Filled 2017-08-13: qty 1

## 2017-08-13 NOTE — Discharge Instructions (Signed)
I recommend close follow-up with your gastroenterologist given your recent history of thrush.  Your labs today were normal including liver function tests, pancreas test, kidney function test, cardiac labs.

## 2017-08-13 NOTE — ED Provider Notes (Signed)
TIME SEEN: 1:53 AM  CHIEF COMPLAINT: Hiccups, shortness of breath after eating  HPI: Patient is a 77 year old male with history of GERD, hypertension, peptic ulcer disease who is status post cholecystectomy who presents to the emergency department with intermittent episodes of hiccuping and shortness of breath after eating.  States he was seen in urgent care for the same and was diagnosed with thrush and finished 6 days of fluconazole.  States that he feels like the thrush has improved as he is no longer having pain with swallowing or difficulty swallowing but still having episodes of feeling like he cannot catch his breath after he is eating and then has hiccups and feels like he cannot belch.  States "I do not think my diaphragm is working properly".  Denies chest pain or chest discomfort.  No current shortness of breath.  Symptoms only present after eating.  No abdominal pain.  No nausea or vomiting.  No diarrhea.  No fever.  Has had previous history of endoscopy years ago with Dr. Carlean Purl which was unremarkable as well as colonoscopy.  No other abdominal surgeries other than cholecystectomy.  No history of steroid use, HIV, diabetes.  ROS: See HPI Constitutional: no fever  Eyes: no drainage  ENT: no runny nose   Cardiovascular:  no chest pain  Resp:  SOB  GI: no vomiting GU: no dysuria Integumentary: no rash  Allergy: no hives  Musculoskeletal: no leg swelling  Neurological: no slurred speech ROS otherwise negative  PAST MEDICAL HISTORY/PAST SURGICAL HISTORY:  Past Medical History:  Diagnosis Date  . Arthritis    shoulders   . Complication of anesthesia    " dont know the name of it ,but they gave it to me at [facility] long in 1983 "   I was cold and had a head ache afterwards "  . GERD (gastroesophageal reflux disease)   . Headache   . History of TIA (transient ischemic attack)   . Hypertension   . Peptic ulcer disease     MEDICATIONS:  Prior to Admission medications    Medication Sig Start Date End Date Taking? Authorizing Provider  lisinopril-hydrochlorothiazide (PRINZIDE,ZESTORETIC) 20-12.5 MG tablet Take 2 tablets by mouth daily. 06/03/17   Bufford Lope, DO  nystatin (MYCOSTATIN) 100000 UNIT/ML suspension Use as directed 5 mLs (500,000 Units total) in the mouth or throat 4 (four) times daily for 7 days. 08/12/17 08/19/17  Kinnie Feil, MD  ondansetron (ZOFRAN) 4 MG tablet Take 1 tablet (4 mg total) by mouth every 8 (eight) hours as needed for up to 2 days for nausea or vomiting. 08/12/17 08/14/17  Kinnie Feil, MD  Potassium Chloride ER 20 MEQ TBCR Take 20 mEq by mouth 2 (two) times daily. Patient not taking: Reported on 08/03/2017 09/24/16 10/01/16  Veatrice Bourbon, MD    ALLERGIES:  Allergies  Allergen Reactions  . Ceclor [Cefaclor] Other (See Comments)    Makes my heart slow and I feel like I am fading away  . Veralipride     Gums bleed, tooth aches  . Amlodipine Other (See Comments)    Gum bleeding  . Antihistamines, Chlorpheniramine-Type Other (See Comments)    Dizziness and nose bleeds  . Beta Adrenergic Blockers Other (See Comments)    Gums bleed  . Celebrex [Celecoxib] Other (See Comments)    Stomach pain  . Ibuprofen Other (See Comments)    Stomach pain    . Lipitor [Atorvastatin] Hypertension  . Nutrasweet Aspartame [Aspartame] Other (See Comments)  Memory loss  . Penicillins Other (See Comments)    Makes my heart slow and I feel like I am fading away Has patient had a PCN reaction causing immediate rash, facial/tongue/throat swelling, SOB or lightheadedness with hypotension: No Has patient had a PCN reaction causing severe rash involving mucus membranes or skin necrosis: No Has patient had a PCN reaction that required hospitalization: No Has patient had a PCN reaction occurring within the last 10 years: No If all of the above answers are "NO", then may proceed with Cephalosporin use.    SOCIAL HISTORY:  Social History    Tobacco Use  . Smoking status: Former Smoker    Types: Cigarettes    Last attempt to quit: 04/20/1977    Years since quitting: 40.3  . Smokeless tobacco: Never Used  Substance Use Topics  . Alcohol use: No    FAMILY HISTORY: Family History  Problem Relation Age of Onset  . Heart disease Mother   . Hypertension Mother   . Heart disease Father   . Hypertension Father     EXAM: BP 130/67 (BP Location: Right Arm)   Pulse 62   Temp 98.5 F (36.9 C) (Oral)   Resp 18   SpO2 98%  CONSTITUTIONAL: Alert and oriented and responds appropriately to questions. Well-appearing; well-nourished HEAD: Normocephalic EYES: Conjunctivae clear, pupils appear equal, EOMI ENT: normal nose; moist mucous membranes; No pharyngeal erythema or petechiae, no tonsillar hypertrophy or exudate, no uvular deviation, no unilateral swelling, no trismus or drooling, no muffled voice, normal phonation, no stridor, multiple dental caries present, no drainable dental abscess noted, no Ludwig's angina, tongue sits flat in the bottom of the mouth, no angioedema, no facial erythema or warmth, no facial swelling; no pain with movement of the neck.  No thrush noted in his mouth, on his tongue or in his posterior oropharynx. NECK: Supple, no meningismus, no nuchal rigidity, no LAD  CARD: RRR; S1 and S2 appreciated; no murmurs, no clicks, no rubs, no gallops RESP: Normal chest excursion without splinting or tachypnea; breath sounds clear and equal bilaterally; no wheezes, no rhonchi, no rales, no hypoxia or respiratory distress, speaking full sentences ABD/GI: Normal bowel sounds; non-distended; soft, non-tender, no rebound, no guarding, no peritoneal signs, no hepatosplenomegaly BACK:  The back appears normal and is non-tender to palpation, there is no CVA tenderness EXT: Normal ROM in all joints; non-tender to palpation; no edema; normal capillary refill; no cyanosis, no calf tenderness or swelling    SKIN: Normal color  for age and race; warm; no rash NEURO: Moves all extremities equally PSYCH: The patient's mood and manner are appropriate. Grooming and personal hygiene are appropriate.  MEDICAL DECISION MAKING: Patient here with episodes of belching, hiccups after eating.  He denies feeling like he has been aspirating.  Lungs are currently clear and he is asymptomatic at this time.  Could be related to acid reflux but also could be related to recent diagnosis of thrush.  He is not a diabetic and denies being immunocompromised.  Unclear why patient has developed thrush.  Will obtain HIV screening.  Cardiac labs unremarkable and have low suspicion that this is ACS but will obtain second troponin.  EKG shows no ischemic abnormality.  Chest x-ray is clear.  His abdominal exam is benign.  Have recommended if his work-up here is unremarkable that he follow-up closely with gastroenterology as he may benefit from another endoscopy to rule out esophageal candidiasis.     ED PROGRESS: Patient's LFTs, lipase  and second troponin negative.  HIV test is pending.  This can be followed up by his outpatient provider.  Have again recommended close follow-up with gastroenterology and I will discharge him with prescription of Protonix.  Discussed return precautions.  Patient verbalized understanding and is comfortable with this plan.  At this time, I do not feel there is any life-threatening condition present. I have reviewed and discussed all results (EKG, imaging, lab, urine as appropriate) and exam findings with patient/family. I have reviewed nursing notes and appropriate previous records.  I feel the patient is safe to be discharged home without further emergent workup and can continue workup as an outpatient as needed. Discussed usual and customary return precautions. Patient/family verbalize understanding and are comfortable with this plan.  Outpatient follow-up has been provided if needed. All questions have been  answered.       EKG Interpretation  Date/Time:  Saturday Aug 13 2017 01:51:17 EDT Ventricular Rate:  72 PR Interval:    QRS Duration: 110 QT Interval:  395 QTC Calculation: 433 R Axis:   26 Text Interpretation:  Sinus rhythm Atrial premature complex Abnormal R-wave progression, early transition Minimal ST depression, lateral leads No significant change since last tracing Confirmed by Vernida Mcnicholas, Cyril Mourning [phone]) on 08/13/2017 2:13:55 AM         Brandelyn Henne, Delice Bison, DO 08/13/17 0330

## 2017-08-18 ENCOUNTER — Other Ambulatory Visit: Payer: Self-pay | Admitting: Gastroenterology

## 2017-08-18 DIAGNOSIS — R1032 Left lower quadrant pain: Secondary | ICD-10-CM | POA: Diagnosis not present

## 2017-08-18 DIAGNOSIS — R634 Abnormal weight loss: Secondary | ICD-10-CM | POA: Diagnosis not present

## 2017-08-18 DIAGNOSIS — K210 Gastro-esophageal reflux disease with esophagitis: Secondary | ICD-10-CM | POA: Diagnosis not present

## 2017-08-18 DIAGNOSIS — R630 Anorexia: Secondary | ICD-10-CM | POA: Diagnosis not present

## 2017-08-18 DIAGNOSIS — Z800 Family history of malignant neoplasm of digestive organs: Secondary | ICD-10-CM | POA: Diagnosis not present

## 2017-08-22 ENCOUNTER — Inpatient Hospital Stay (HOSPITAL_COMMUNITY)
Admission: EM | Admit: 2017-08-22 | Discharge: 2017-08-24 | DRG: 872 | Disposition: A | Discharge status: Home / Self Care | Payer: Medicare Other | Attending: Family Medicine | Admitting: Family Medicine

## 2017-08-22 ENCOUNTER — Emergency Department (HOSPITAL_COMMUNITY): Payer: Medicare Other

## 2017-08-22 ENCOUNTER — Other Ambulatory Visit: Payer: Self-pay

## 2017-08-22 ENCOUNTER — Encounter (HOSPITAL_COMMUNITY): Payer: Self-pay | Admitting: Emergency Medicine

## 2017-08-22 DIAGNOSIS — E860 Dehydration: Secondary | ICD-10-CM | POA: Diagnosis not present

## 2017-08-22 DIAGNOSIS — Z886 Allergy status to analgesic agent status: Secondary | ICD-10-CM

## 2017-08-22 DIAGNOSIS — N390 Urinary tract infection, site not specified: Secondary | ICD-10-CM | POA: Insufficient documentation

## 2017-08-22 DIAGNOSIS — E876 Hypokalemia: Secondary | ICD-10-CM | POA: Diagnosis not present

## 2017-08-22 DIAGNOSIS — Z6831 Body mass index (BMI) 31.0-31.9, adult: Secondary | ICD-10-CM

## 2017-08-22 DIAGNOSIS — Z9049 Acquired absence of other specified parts of digestive tract: Secondary | ICD-10-CM

## 2017-08-22 DIAGNOSIS — R059 Cough, unspecified: Secondary | ICD-10-CM | POA: Diagnosis present

## 2017-08-22 DIAGNOSIS — R066 Hiccough: Secondary | ICD-10-CM | POA: Diagnosis present

## 2017-08-22 DIAGNOSIS — J690 Pneumonitis due to inhalation of food and vomit: Secondary | ICD-10-CM | POA: Diagnosis present

## 2017-08-22 DIAGNOSIS — A419 Sepsis, unspecified organism: Secondary | ICD-10-CM | POA: Insufficient documentation

## 2017-08-22 DIAGNOSIS — K219 Gastro-esophageal reflux disease without esophagitis: Secondary | ICD-10-CM | POA: Diagnosis not present

## 2017-08-22 DIAGNOSIS — E861 Hypovolemia: Secondary | ICD-10-CM

## 2017-08-22 DIAGNOSIS — R000 Tachycardia, unspecified: Secondary | ICD-10-CM | POA: Diagnosis not present

## 2017-08-22 DIAGNOSIS — Z881 Allergy status to other antibiotic agents status: Secondary | ICD-10-CM

## 2017-08-22 DIAGNOSIS — R05 Cough: Secondary | ICD-10-CM | POA: Diagnosis present

## 2017-08-22 DIAGNOSIS — Z79899 Other long term (current) drug therapy: Secondary | ICD-10-CM

## 2017-08-22 DIAGNOSIS — R8271 Bacteriuria: Secondary | ICD-10-CM | POA: Diagnosis present

## 2017-08-22 DIAGNOSIS — E878 Other disorders of electrolyte and fluid balance, not elsewhere classified: Secondary | ICD-10-CM | POA: Diagnosis present

## 2017-08-22 DIAGNOSIS — E441 Mild protein-calorie malnutrition: Secondary | ICD-10-CM | POA: Diagnosis not present

## 2017-08-22 DIAGNOSIS — Z8673 Personal history of transient ischemic attack (TIA), and cerebral infarction without residual deficits: Secondary | ICD-10-CM

## 2017-08-22 DIAGNOSIS — R1084 Generalized abdominal pain: Secondary | ICD-10-CM

## 2017-08-22 DIAGNOSIS — R630 Anorexia: Secondary | ICD-10-CM | POA: Diagnosis present

## 2017-08-22 DIAGNOSIS — Z8711 Personal history of peptic ulcer disease: Secondary | ICD-10-CM

## 2017-08-22 DIAGNOSIS — Z87891 Personal history of nicotine dependence: Secondary | ICD-10-CM

## 2017-08-22 DIAGNOSIS — Z9842 Cataract extraction status, left eye: Secondary | ICD-10-CM

## 2017-08-22 DIAGNOSIS — Z888 Allergy status to other drugs, medicaments and biological substances status: Secondary | ICD-10-CM

## 2017-08-22 DIAGNOSIS — Z9841 Cataract extraction status, right eye: Secondary | ICD-10-CM

## 2017-08-22 DIAGNOSIS — I10 Essential (primary) hypertension: Secondary | ICD-10-CM | POA: Diagnosis present

## 2017-08-22 DIAGNOSIS — R1013 Epigastric pain: Secondary | ICD-10-CM | POA: Diagnosis present

## 2017-08-22 DIAGNOSIS — E785 Hyperlipidemia, unspecified: Secondary | ICD-10-CM | POA: Diagnosis present

## 2017-08-22 DIAGNOSIS — J189 Pneumonia, unspecified organism: Secondary | ICD-10-CM | POA: Diagnosis present

## 2017-08-22 DIAGNOSIS — N400 Enlarged prostate without lower urinary tract symptoms: Secondary | ICD-10-CM | POA: Diagnosis present

## 2017-08-22 DIAGNOSIS — E871 Hypo-osmolality and hyponatremia: Secondary | ICD-10-CM

## 2017-08-22 DIAGNOSIS — R109 Unspecified abdominal pain: Secondary | ICD-10-CM | POA: Diagnosis not present

## 2017-08-22 DIAGNOSIS — Z8249 Family history of ischemic heart disease and other diseases of the circulatory system: Secondary | ICD-10-CM

## 2017-08-22 DIAGNOSIS — Z880 Allergy status to penicillin: Secondary | ICD-10-CM

## 2017-08-22 DIAGNOSIS — B370 Candidal stomatitis: Secondary | ICD-10-CM | POA: Diagnosis present

## 2017-08-22 DIAGNOSIS — D72829 Elevated white blood cell count, unspecified: Secondary | ICD-10-CM | POA: Diagnosis present

## 2017-08-22 DIAGNOSIS — R739 Hyperglycemia, unspecified: Secondary | ICD-10-CM | POA: Diagnosis present

## 2017-08-22 HISTORY — DX: Migraine, unspecified, not intractable, without status migrainosus: G43.909

## 2017-08-22 HISTORY — DX: Chronic or unspecified gastric ulcer with hemorrhage: K25.4

## 2017-08-22 HISTORY — DX: Cardiac murmur, unspecified: R01.1

## 2017-08-22 HISTORY — DX: Personal history of other medical treatment: Z92.89

## 2017-08-22 HISTORY — DX: Sepsis, unspecified organism: A41.9

## 2017-08-22 LAB — CBC WITH DIFFERENTIAL/PLATELET
ABS IMMATURE GRANULOCYTES: 0.1 10*3/uL (ref 0.0–0.1)
BASOS ABS: 0 10*3/uL (ref 0.0–0.1)
Basophils Relative: 0 %
Eosinophils Absolute: 0.1 10*3/uL (ref 0.0–0.7)
Eosinophils Relative: 1 %
HCT: 37.3 % — ABNORMAL LOW (ref 39.0–52.0)
HEMOGLOBIN: 12.8 g/dL — AB (ref 13.0–17.0)
IMMATURE GRANULOCYTES: 1 %
LYMPHS PCT: 11 %
Lymphs Abs: 1.6 10*3/uL (ref 0.7–4.0)
MCH: 28.8 pg (ref 26.0–34.0)
MCHC: 34.3 g/dL (ref 30.0–36.0)
MCV: 84 fL (ref 78.0–100.0)
Monocytes Absolute: 1.1 10*3/uL — ABNORMAL HIGH (ref 0.1–1.0)
Monocytes Relative: 7 %
NEUTROS ABS: 12.4 10*3/uL — AB (ref 1.7–7.7)
Neutrophils Relative %: 80 %
Platelets: 406 10*3/uL — ABNORMAL HIGH (ref 150–400)
RBC: 4.44 MIL/uL (ref 4.22–5.81)
RDW: 13.2 % (ref 11.5–15.5)
WBC: 15.4 10*3/uL — AB (ref 4.0–10.5)

## 2017-08-22 LAB — LACTIC ACID, PLASMA: Lactic Acid, Venous: 1.6 mmol/L (ref 0.5–1.9)

## 2017-08-22 LAB — URINALYSIS, ROUTINE W REFLEX MICROSCOPIC
GLUCOSE, UA: NEGATIVE mg/dL
Ketones, ur: NEGATIVE mg/dL
NITRITE: POSITIVE — AB
PROTEIN: 100 mg/dL — AB
Specific Gravity, Urine: 1.026 (ref 1.005–1.030)
pH: 5 (ref 5.0–8.0)

## 2017-08-22 LAB — I-STAT TROPONIN, ED: Troponin i, poc: 0.02 ng/mL (ref 0.00–0.08)

## 2017-08-22 LAB — COMPREHENSIVE METABOLIC PANEL
ALBUMIN: 3 g/dL — AB (ref 3.5–5.0)
ALK PHOS: 82 U/L (ref 38–126)
ALT: 41 U/L (ref 17–63)
AST: 37 U/L (ref 15–41)
Anion gap: 13 (ref 5–15)
BUN: 7 mg/dL (ref 6–20)
CALCIUM: 8.9 mg/dL (ref 8.9–10.3)
CO2: 26 mmol/L (ref 22–32)
CREATININE: 0.96 mg/dL (ref 0.61–1.24)
Chloride: 88 mmol/L — ABNORMAL LOW (ref 101–111)
GFR calc Af Amer: >60 mL/min (ref >60)
GFR calc non Af Amer: >60 mL/min (ref >60)
GLUCOSE: 169 mg/dL — AB (ref 65–99)
Potassium: 3 mmol/L — ABNORMAL LOW (ref 3.5–5.1)
SODIUM: 127 mmol/L — AB (ref 135–145)
Total Bilirubin: 0.9 mg/dL (ref 0.3–1.2)
Total Protein: 7.4 g/dL (ref 6.5–8.1)

## 2017-08-22 LAB — LIPASE, BLOOD: Lipase: 23 U/L (ref 11–51)

## 2017-08-22 MED ORDER — GI COCKTAIL ~~LOC~~
30.0000 mL | Freq: Once | ORAL | Status: AC
  Administered 2017-08-22: 30 mL via ORAL
  Filled 2017-08-22: qty 30

## 2017-08-22 MED ORDER — BARIUM SULFATE 2 % PO SUSP: 450.0000 mL | Freq: Once | ORAL | Status: DC

## 2017-08-22 MED ORDER — SODIUM CHLORIDE 0.9 % IV SOLN
INTRAVENOUS | Status: AC
  Administered 2017-08-22: 23:00:00 via INTRAVENOUS

## 2017-08-22 MED ORDER — CIPROFLOXACIN IN D5W 400 MG/200ML IV SOLN
400.0000 mg | Freq: Once | INTRAVENOUS | Status: DC
  Administered 2017-08-22: 400 mg via INTRAVENOUS
  Filled 2017-08-22: qty 200

## 2017-08-22 MED ORDER — IOHEXOL 300 MG/ML  SOLN
100.0000 mL | Freq: Once | INTRAMUSCULAR | Status: AC | PRN
  Administered 2017-08-22: 100 mL via INTRAVENOUS

## 2017-08-22 MED ORDER — ONDANSETRON 4 MG PO TBDP
8.0000 mg | ORAL_TABLET | Freq: Once | ORAL | Status: AC
  Administered 2017-08-22: 8 mg via ORAL
  Filled 2017-08-22: qty 2

## 2017-08-22 MED ORDER — SODIUM CHLORIDE 0.9 % IV BOLUS
1000.0000 mL | Freq: Once | INTRAVENOUS | Status: AC
  Administered 2017-08-22: 1000 mL via INTRAVENOUS

## 2017-08-22 NOTE — ED Notes (Signed)
Cipro was started and then discontinued.  Patient received approximately half of the 400mg .  Admitting MD at bedside and aware.

## 2017-08-22 NOTE — ED Notes (Signed)
Results reviewed.  No changes in acuity at this time 

## 2017-08-22 NOTE — ED Provider Notes (Signed)
[place] EMERGENCY DEPARTMENT Provider Note   CSN: [id number] Arrival date & time: 08/22/17  1422    History   Chief Complaint Chief Complaint  Patient presents with  . Abdominal Pain    HPI Madison Albea is a 77 y.o. male.  The history is provided by the patient.  Abdominal Pain   This is a new problem. The current episode started more than 1 week ago. The problem occurs constantly. The problem has been gradually worsening. The pain is associated with eating. The pain is located in the epigastric region and suprapubic region. The quality of the pain is burning. The pain is moderate. Associated symptoms include anorexia, belching and nausea. Pertinent negatives include fever, vomiting, dysuria, hematuria and arthralgias. The symptoms are aggravated by eating. Nothing relieves the symptoms. His past medical history is significant for GERD.    Past Medical History:  Diagnosis Date  . Arthritis    shoulders   . Complication of anesthesia    " dont know the name of it ,but they gave it to me at [facility] in 1983 "   I was cold and had a head ache afterwards "  . GERD (gastroesophageal reflux disease)   . Headache   . History of TIA (transient ischemic attack)   . Hypertension   . Peptic ulcer disease     Patient Active Problem List   Diagnosis Date Noted  . Hyponatremia 08/22/2017  . Sepsis secondary to UTI ([place]) 08/22/2017  . GERD (gastroesophageal reflux disease) 08/22/2017  . Hypokalemia 09/24/2016  . Erectile dysfunction 12/28/2013  . HLD (hyperlipidemia) 08/11/2012  . HTN (hypertension) 06/29/2012    Past Surgical History:  Procedure Laterality Date  . CATARACT EXTRACTION Bilateral   . CHOLECYSTECTOMY    . COLONOSCOPY  2012 ?  [facility] [place] Medications    Prior to Admission medications   Medication Sig Start Date End Date Taking? Authorizing Provider  lisinopril-hydrochlorothiazide (PRINZIDE,ZESTORETIC)  20-12.5 MG tablet Take 2 tablets by mouth daily. 06/03/17  Yes Bufford Lope, DO  omeprazole (PRILOSEC) 20 MG capsule Take 20 mg by mouth daily.   Yes [provider]  PRESCRIPTION MEDICATION Take 1 tablet by mouth daily. ACID REDUCER   Yes [provider]  pantoprazole (PROTONIX) 40 MG tablet Take 1 tablet (40 mg total) by mouth daily. Patient not taking: Reported on 08/22/2017 08/13/17   Ward, Delice Bison, DO  Potassium Chloride ER 20 MEQ TBCR Take 20 mEq by mouth 2 (two) times daily. Patient not taking: Reported on 08/03/2017 09/24/16 10/01/16  Veatrice Bourbon, MD    Family History Family History  Problem Relation Age of Onset  . Heart disease Mother   . Hypertension Mother   . Heart disease Father   . Hypertension Father     Social History Social History   Tobacco Use  . Smoking status: Former Smoker    Types: Cigarettes    Last attempt to quit: 04/20/1977    Years since quitting: 40.3  . Smokeless tobacco: Never Used  Substance Use Topics  . Alcohol use: No  . Drug use: No     Allergies   Ceclor [cefaclor]; Veralipride; Amlodipine; Antihistamines, chlorpheniramine-type; Beta adrenergic blockers; Celebrex [celecoxib]; Ibuprofen; Lipitor [atorvastatin]; Nutrasweet aspartame [aspartame]; and Penicillins   Review of Systems Review of Systems  Constitutional: Negative for chills and fever.  HENT: Negative for ear pain and sore throat.   Eyes:  Negative for pain and visual disturbance.  Respiratory: Negative for cough and shortness of breath.   Cardiovascular: Negative for chest pain and palpitations.  Gastrointestinal: Positive for abdominal pain, anorexia and nausea. Negative for vomiting.  Genitourinary: Positive for decreased urine volume. Negative for dysuria and hematuria.  Musculoskeletal: Negative for arthralgias and back pain.  Skin: Negative for color change and rash.  Neurological: Negative for seizures and syncope.  All other systems reviewed and are  negative.    Physical Exam Updated Vital Signs BP (!) 130/52   Pulse 80   Temp 98.9 F (37.2 C) (Oral)   Resp 19   SpO2 96%   Physical Exam  Constitutional: He appears well-developed and well-nourished.  HENT:  Head: Normocephalic and atraumatic.  Eyes: Conjunctivae are normal.  Neck: Neck supple.  Cardiovascular: Regular rhythm.  No murmur heard. Mild tachycardia  Pulmonary/Chest: Effort normal and breath sounds normal. No respiratory distress.  Abdominal: Soft. He exhibits no distension. There is no tenderness. There is no rebound and no guarding.  Genitourinary:  Genitourinary Comments: Prostate is slightly enlarged but not tender or boggy.  Musculoskeletal: He exhibits no edema or tenderness.  Neurological: He is alert.  Skin: Skin is warm and dry.  Psychiatric: He has a normal mood and affect.  Nursing note and vitals reviewed.    ED Treatments / Results  Labs (all labs ordered are listed, but only abnormal results are displayed) Labs Reviewed  CBC WITH DIFFERENTIAL/PLATELET - Abnormal; Notable for the following components:      Result Value   WBC 15.4 (*)    Hemoglobin 12.8 (*)    HCT 37.3 (*)    Platelets 406 (*)    Neutro Abs 12.4 (*)    Monocytes Absolute 1.1 (*)    All other components within normal limits  COMPREHENSIVE METABOLIC PANEL - Abnormal; Notable for the following components:   Sodium 127 (*)    Potassium 3.0 (*)    Chloride 88 (*)    Glucose, Bld 169 (*)    Albumin 3.0 (*)    All other components within normal limits  URINALYSIS, ROUTINE W REFLEX MICROSCOPIC - Abnormal; Notable for the following components:   Color, Urine AMBER (*)    APPearance CLOUDY (*)    Hgb urine dipstick SMALL (*)    Bilirubin Urine SMALL (*)    Protein, ur 100 (*)    Nitrite POSITIVE (*)    Leukocytes, UA MODERATE (*)    WBC, UA >50 (*)    Bacteria, UA MANY (*)    All other components within normal limits  URINE CULTURE  LIPASE, BLOOD  LACTIC ACID, PLASMA   LACTIC ACID, PLASMA  I-STAT TROPONIN, ED    EKG None  Radiology Dg Chest 2 View  Result Date: 08/22/2017 CLINICAL DATA:  Nausea, abdominal pain and difficulty eating for 1 week. EXAM: CHEST - 2 VIEW COMPARISON:  PA and lateral chest 08/12/2017 and 04/09/2014. FINDINGS: Mild subsegmental atelectasis is seen in the lung bases. The lungs are otherwise clear. Heart size is normal. No pneumothorax or pleural effusion. No acute bony abnormality. IMPRESSION: No acute disease. Electronically Signed   By: Inge Rise M.D.   On: 08/22/2017 15:32   Ct Abdomen Pelvis W Contrast  Result Date: 08/22/2017 CLINICAL DATA:  Nausea. Abdominal pain. Acid reflux. Abdominal distension. EXAM: CT ABDOMEN AND PELVIS WITH CONTRAST TECHNIQUE: Multidetector CT imaging of the abdomen and pelvis was performed using the standard protocol following bolus administration of intravenous contrast.  CONTRAST:  168mL OMNIPAQUE IOHEXOL 300 MG/ML  SOLN COMPARISON:  10/31/2009 FINDINGS: Lower chest: Patchy right lower lobe airspace disease with minimal probable atelectasis at the left lung base laterally. Mild cardiomegaly, without pericardial or pleural effusion. Hepatobiliary: Mild motion degradation throughout. Normal liver. Cholecystectomy, without biliary ductal dilatation. Pancreas: Normal, without mass or ductal dilatation. Spleen: Normal in size, without focal abnormality. Adrenals/Urinary Tract: Mild left adrenal thickening. Normal right adrenal gland. Lower pole left renal cyst. Bilateral too small to characterize renal lesions. Normal urinary bladder. Stomach/Bowel: The proximal stomach is underdistended. Apparent wall thickening, including on image 18/3, is at least partially felt to be secondary. Scattered colonic diverticula. Normal terminal ileum and appendix. Normal small bowel. Vascular/Lymphatic: Aortic and branch vessel atherosclerosis. No abdominopelvic adenopathy. Reproductive: Mild prostatomegaly. Other: No  significant free fluid.  No free intraperitoneal air. Musculoskeletal: Degenerative partial fusion of the bilateral sacroiliac joints. Lumbosacral spondylosis. IMPRESSION: 1. Mild motion degradation. 2.  No acute process in the abdomen or pelvis. 3. Apparent proximal gastric wall thickening is favored to be due to underdistention. Gastritis cannot be excluded. 4.  Aortic Atherosclerosis (ICD10-I70.0). 5. Prostatomegaly. 6. Peripheral right base airspace disease is indeterminate but new since 2011. Considerations include infection/aspiration or interval scarring. Correlate with acute pulmonary symptoms. Electronically Signed   By: Abigail Miyamoto M.D.   On: 08/22/2017 20:39    Procedures Procedures (including critical care time)  Medications Ordered in ED Medications  barium (READI-CAT 2) 2 % suspension 450 mL (has no administration in time range)  gi cocktail (Maalox,Lidocaine,Donnatal) (30 mLs Oral Given 08/22/17 1456)  ondansetron (ZOFRAN-ODT) disintegrating tablet 8 mg (8 mg Oral Given 08/22/17 1456)  sodium chloride 0.9 % bolus 1,000 mL (1,000 mLs Intravenous New Bag/Given 08/22/17 2202)  iohexol (OMNIPAQUE) 300 MG/ML solution 100 mL (100 mLs Intravenous Contrast Given 08/22/17 2007)     Initial Impression / Assessment and Plan / ED Course  I have reviewed the triage vital signs and the nursing notes.  Pertinent labs & imaging results that were available during my care of the patient were reviewed by me and considered in my medical decision making (see chart for details).    Patient is a 77 year old male with history as above, who was recently diagnosed with GERD.  Resents today with ongoing abdominal pain, anorexia, and feeling weak.  He reports he has an outpatient CT scheduled, however he did not feel like he could wait for this.  He denies any fever, chills, or urinary symptoms.  Reports his pain is a burning pain and worse when he eats.  This is caused him to not tolerate food.  He was seen  in the first look process where his laboratory work-up was notable for a leukocytosis, hyponatremia, hypokalemia, and hypochloremia.  I suspect this is due to poor p.o. intake.  His urinalysis also shows evidence of infection.  Prostate exam does not reveal concern for prostatitis.  Given his anorexia and electrolyte derangements, patient will require admission.  I have given him a dose of Cipro for the urinary tract infection.  Family medicine has been consulted for admission.  I suspect his p.o. intolerance is more related to gastritis than his urinary tract infection.  His CT scan did show possible gastritis.  No other acute findings on his CT scan.  Final Clinical Impressions(s) / ED Diagnoses   Final diagnoses:  Generalized abdominal pain  Dehydration  Lower urinary tract infectious disease  Hyponatremia  Hypokalemia    ED Discharge Orders  None       Clifton James, MD 08/22/17 2245    Mesner, Corene Cornea, MD 08/23/17 0475

## 2017-08-22 NOTE — ED Provider Notes (Signed)
Patient placed in Quick Look pathway, seen and evaluated   Chief Complaint: abdominal pain, nausea, weakness, sob, dizziness  HPI:   Pt states his symptoms began 1 week ago. States reports nausea, abdominal pain, unable to eat. States was diagnosed with acid reflux by GI. States was scheduled for CT scan this Thursday but states he is feeling too weak to wait. Denies CP. States burping and belching. Reports abdominal fullness and distention. No urinary symptoms.   ROS: Abd pain, nausea, vomiting, dizziness.   Physical Exam:   Gen: No distress  Neuro: Awake and Alert  Skin: Warm    Focused Exam: nad. Belching and burping. Coughing. Tachycardic, otherwise normal HR and rhythm. Lungs clear bilaterally. Diffuse abd tenderness.    Initiation of care has begun. The patient has been counseled on the process, plan, and necessity for staying for the completion/evaluation, and the remainder of the medical screening examination   Patient in emergency department with abdominal pain, abdominal distention, nausea, vomiting, no appetite.  He is also endorsing some cough, shortness of breath, chest discomfort.  He is tachycardic, he appears to be dry.  Will get labs and CT abdomen pelvis.  Will get a chest x-ray for cough, chest pain, shortness of breath.  Will try GI cocktail and Zofran while in triage.  Vitals:   08/22/17 1435  BP: 124/76  Pulse: (!) 113  Resp: 19  TempSrc: Oral  SpO2: 94%      [name], PA-C 08/22/17 1502    [name], MD 08/24/17 1310

## 2017-08-22 NOTE — H&P (Addendum)
[facility] Admission History and Physical Service Pager: [phone]  Patient name: Steven Simmons Medical record number: [id number] Date of birth: 08-Aug-1940 Age: 77 y.o. Gender: male  Primary Care Provider: Bufford Lope, DO Consultants: none Code Status: full  Chief Complaint: burning with eating  Assessment and Plan: Steven Simmons is a 77 y.o. male presenting with ~3 week history of intractable GERD leading to decreased PO intake. Admitted due to multiple electrolyte abnormalities. UA consistent with UTI.  Intractable GERD  Nausea Patient with known chronic GERD that has continually worsened over the past three weeks. Was scheduled to have an outpatient CT scan performed at direction of [facility] on 5/23. Given that patient has now had this CT scan performed in the hospital, will discuss with [facility] in am to determine if any additional workup or intervention needs to be performed in hospital. Given that patient has GERD intractable to multiple PPI and has some evidence for gastritis on CT scan will check H pylori antigen. Has been feeling better after receiving GI cocktail in ED. Will continue PPI and treat with prn antacids for now. Less likely cardiac, troponin negative x 2, EKG without ST changes, or twave inversions  - admit to family medicine teaching service, Dr. McDiarmid, appropriate for medsurg - vital signs per floor routine - continue protonix 40mg  daily, can possibly increase to BID if needed - Contact [facility] for further recommendations in AM - Maalox or tums prn for resistant burning - regular diet for now - NS @ 100 mL/hr - zofran 4mg  q 8 hours for nausea - lovenox for dvt ppx  Urinary tract infection Patient with positive nitrites and Moderate leuks on UA. Received dose of ciprofloxacin IV. Will continue cipro 400mg  BID IV for now, but can likely switch to PO on 5/21. Follow up urine culture. Due to patient already receiving abx, will not  draw blood cultures. - continue cipro IV 400mg  bid - follow up urine culture - monitor fever curve, vital signs  Hypokalemia Patient with potassium of 3.0. Likely due to decreased PO intake. Has been slowly trending down over last month. 3.3 on 5/10. Will replete with 69meq kdur. Will check BMP in am to see if resolved. Is already on kdur 35meq bid as outpatient. Will hold this additional dose while admitted. - 38meq kdur - bmp in am  Hyponatremia Na of 127 on admission. Likely due to poor PO intake over last month. Will initially need to replete slowly to avoid osmotic demyelination. Will replete with NS @ rate of 171mL/hr and check bmp in am. - NS @ 111mL/hr - bmp in am  Cough  peripheral right airspace disease Likely etiology for patient's cough is 2/2 to his intractable GERD. Given that he has had episodes of violent coughing while laying down, he may have a small bit of aspiration. This remains most likely explanation for his CT scan finding. Patient afebrile and this area did not appear on CXR. Will monitor but it will likely resolve over time.  Hypertension Given electrolyte abnormalities will hold home hctz and lisinopril. Pressures in 120-140 SBP range in ED. If becomes hypertensive will add on prn hydralazine. - holding hctz/lisinopril - hydralazine prn for SBP>180 - vital signs per floor routine  Hyperglycemia Glucose of 169 on admission. Last A1C 6.1 in 07/2015. Will get A1C. CBG checks in AM. Consider SSI if elevated.  Hyperlipidemia Patient with history of elevated cholesterol and elevated LDL in past. Has refused statin  therapy in the past. Would benefit from statin therapy. Will get lipid panel as no new lab results for cholesterol since 2017.  Mild Protein calorie Malnutrition Albumin 3.0. Can supplement meals with ensure shakes.  PMH is significant for GERD, Hypertension, Erectile dysfunction, HLD, H/O TIA  FEN/GI: regular diet, NS @ 120mL/hr Prophylaxis: lovenox  for dvt ppx  Disposition: likely home following clinical course  History of Present Illness:  Steven Simmons is a 77 y.o. male presenting with inability to tolerate PO for last 3 weeks. Patient states that the reason he has had such poor PO tolerance is due to overwhelming burning sensation in abdomen when he eats. He has been evaluated by [facility] in the past and was scheduled to get a CT abdomen and pelvis with contrast on 5/23. He states that the burning became so bad that he was unable to tolerate it which prompted his visit to [facility] ED. Accompanying symptoms are cough when laying flat, hiccups, and a lot of "belching". He denies any vomiting but does endorse nausea. He has not noticed any blood in his stool and has been having regular non-diarrhea bowel movements.  Of note patient was seen at Urgent care on 5/1 for sore throat after an episode of acid reflux. He was diagnosed with oropharyngeal candidiasis and treated with fluconazole and mycelex. He was seen in the ED on 5/10 for hiccup and shortness of breath after eating. Was discharged home with prescription for Protonix and f/u with GI. Initially only on prilosec 20mg  prior to that ed visit.  Workup in the Ed consisted of a cmp, cbc, troponin, lactic acid, UA, chest xray, CT abdomen and pelvis, and EKG. This workup significant for na 127, k 3.0, chloride 88, glucose 169, albumin 3.0. WBC of 15.4, hgb 12.8, and plt 406. Troponin 0.02, lactic acid 1.6. UA had positive nitrites, moderate leuks, and small hgb. CT abdomen and pelvis showed no acute process, but did show proximal gastric wall thickening which could be related to gastritis. There was also noted to be peripheral right base airspace disease which is new since 2011. Chest xray showed no acute disease. EKG showed sinus tachycardia.  Due to his UA with + nitrites he received a half dose of 400mg  ciprofloxicin which was discontinued for unknown reasons. He also received zofran x1, maalox,  lidocaine, donnatal cocktail x1    Review Of Systems: Per HPI with the following additions:  Review of Systems  Constitutional: Negative for chills and fever.  Eyes: Negative for pain and discharge.  Respiratory: Positive for cough.   Cardiovascular: Negative for chest pain.  Gastrointestinal: Positive for abdominal pain, heartburn and nausea. Negative for blood in stool, constipation, diarrhea and vomiting.  Genitourinary: Negative for dysuria.  Musculoskeletal: Negative for myalgias and neck pain.  Neurological: Negative for dizziness and headaches.    Patient Active Problem List   Diagnosis Date Noted  . Hyponatremia 08/22/2017  . Sepsis secondary to UTI ([place]) 08/22/2017  . GERD (gastroesophageal reflux disease) 08/22/2017  . Urinary tract infection 08/22/2017  . Hypokalemia 09/24/2016  . Erectile dysfunction 12/28/2013  . HLD (hyperlipidemia) 08/11/2012  . HTN (hypertension) 06/29/2012    Past Medical History: Past Medical History:  Diagnosis Date  . Arthritis    shoulders   . Complication of anesthesia    " dont know the name of it ,but they gave it to me at [facility] in 1983 "   I was cold and had a head ache afterwards "  .  GERD (gastroesophageal reflux disease)   . Headache   . History of TIA (transient ischemic attack)   . Hypertension   . Peptic ulcer disease     Past Surgical History: Past Surgical History:  Procedure Laterality Date  . CATARACT EXTRACTION Bilateral   . CHOLECYSTECTOMY    . COLONOSCOPY  2012 ?  [facility] [place]    Social History: Social History   Tobacco Use  . Smoking status: Former Smoker    Types: Cigarettes    Last attempt to quit: 04/20/1977    Years since quitting: 40.3  . Smokeless tobacco: Never Used  Substance Use Topics  . Alcohol use: No  . Drug use: No   Additional social history: Please also refer to relevant sections of EMR.  Family History: Family History  Problem Relation Age of Onset  .  Heart disease Mother   . Hypertension Mother   . Heart disease Father   . Hypertension Father    Allergies and Medications: Allergies  Allergen Reactions  . Ceclor [Cefaclor] Other (See Comments)    Makes my heart slow and I feel like I am fading away  . Veralipride     Gums bleed, tooth aches  . Amlodipine Other (See Comments)    Gum bleeding  . Antihistamines, Chlorpheniramine-Type Other (See Comments)    Dizziness and nose bleeds  . Beta Adrenergic Blockers Other (See Comments)    Gums bleed  . Celebrex [Celecoxib] Other (See Comments)    Stomach pain  . Ibuprofen Other (See Comments)    Stomach pain    . Lipitor [Atorvastatin] Hypertension  . Nutrasweet Aspartame [Aspartame] Other (See Comments)    Memory loss  . Penicillins Other (See Comments)    Makes my heart slow and I feel like I am fading away Has patient had a PCN reaction causing immediate rash, facial/tongue/throat swelling, SOB or lightheadedness with hypotension: No Has patient had a PCN reaction causing severe rash involving mucus membranes or skin necrosis: No Has patient had a PCN reaction that required hospitalization: No Has patient had a PCN reaction occurring within the last 10 years: No If all of the above answers are "NO", then may proceed with Cephalosporin use.   No current facility-administered medications on file prior to encounter.    Current Outpatient Medications on File Prior to Encounter  Medication Sig Dispense Refill  . lisinopril-hydrochlorothiazide (PRINZIDE,ZESTORETIC) 20-12.5 MG tablet Take 2 tablets by mouth daily. 60 tablet 3  . omeprazole (PRILOSEC) 20 MG capsule Take 20 mg by mouth daily.    Marland Kitchen PRESCRIPTION MEDICATION Take 1 tablet by mouth daily. ACID REDUCER    . pantoprazole (PROTONIX) 40 MG tablet Take 1 tablet (40 mg total) by mouth daily. (Patient not taking: Reported on 08/22/2017) 30 tablet 1  . Potassium Chloride ER 20 MEQ TBCR Take 20 mEq by mouth 2 (two) times daily.  (Patient not taking: Reported on 08/03/2017) 60 tablet 2    Objective: BP 134/77   Pulse 89   Temp 98.9 F (37.2 C) (Oral)   Resp 19   SpO2 93%  Exam: General: No acute distress, resting comfortable, sitting up in bed. Obese, caucasian male Eyes: eomi, perrl. ENTM: ears and face without any signs of external trauma Neck: range of motion intact Cardiovascular: rrr, no m/r/g. Palpable pt/dpt bilaterally. Palpable radial pulse bilaterally Respiratory: lungs clear to ausculation bilaterally, no increased work of breathing, mild non-productive cough noted Gastrointestinal: soft, non-tender, non-distended. Bowel sounds present MSK: 5/5  strength BUE, BLE. Derm: numerous seborrheic keratosis noted in abdomen and back Neuro: aox3, cn 2-12 intact, no focal neuro deficits Psych: appropriate  Labs and Imaging: CBC BMET  Recent Labs  Lab 08/22/17 1449  WBC 15.4*  HGB 12.8*  HCT 37.3*  PLT 406*   Recent Labs  Lab 08/22/17 1449  NA 127*  K 3.0*  CL 88*  CO2 26  BUN 7  CREATININE 0.96  GLUCOSE 169*  CALCIUM 8.9      Guadalupe Dawn, MD 08/22/2017, 11:59 PM PGY-1, [facility] pager: [phone], text pages welcome  UPPER LEVEL ADDENDUM  I have read the above note and made revisions highlighted in blue.  Kerrin Mo, MD, PGY-3 [facility]

## 2017-08-22 NOTE — ED Triage Notes (Signed)
Patient presents to ED for assessment abdominal pain, SOB, chest pain, n/v/d.  PA did quick look

## 2017-08-23 ENCOUNTER — Other Ambulatory Visit: Payer: Self-pay

## 2017-08-23 ENCOUNTER — Encounter (HOSPITAL_COMMUNITY): Payer: Self-pay | Admitting: General Practice

## 2017-08-23 DIAGNOSIS — E878 Other disorders of electrolyte and fluid balance, not elsewhere classified: Secondary | ICD-10-CM | POA: Diagnosis present

## 2017-08-23 DIAGNOSIS — E860 Dehydration: Secondary | ICD-10-CM | POA: Diagnosis present

## 2017-08-23 DIAGNOSIS — E876 Hypokalemia: Secondary | ICD-10-CM | POA: Diagnosis not present

## 2017-08-23 DIAGNOSIS — I10 Essential (primary) hypertension: Secondary | ICD-10-CM | POA: Diagnosis not present

## 2017-08-23 DIAGNOSIS — J690 Pneumonitis due to inhalation of food and vomit: Secondary | ICD-10-CM

## 2017-08-23 DIAGNOSIS — R1013 Epigastric pain: Secondary | ICD-10-CM | POA: Diagnosis not present

## 2017-08-23 DIAGNOSIS — E861 Hypovolemia: Secondary | ICD-10-CM | POA: Diagnosis not present

## 2017-08-23 DIAGNOSIS — R319 Hematuria, unspecified: Secondary | ICD-10-CM | POA: Diagnosis not present

## 2017-08-23 DIAGNOSIS — B370 Candidal stomatitis: Secondary | ICD-10-CM

## 2017-08-23 DIAGNOSIS — Z8711 Personal history of peptic ulcer disease: Secondary | ICD-10-CM | POA: Diagnosis not present

## 2017-08-23 DIAGNOSIS — R05 Cough: Secondary | ICD-10-CM

## 2017-08-23 DIAGNOSIS — Z8249 Family history of ischemic heart disease and other diseases of the circulatory system: Secondary | ICD-10-CM | POA: Diagnosis not present

## 2017-08-23 DIAGNOSIS — D72828 Other elevated white blood cell count: Secondary | ICD-10-CM

## 2017-08-23 DIAGNOSIS — N390 Urinary tract infection, site not specified: Secondary | ICD-10-CM | POA: Diagnosis present

## 2017-08-23 DIAGNOSIS — E785 Hyperlipidemia, unspecified: Secondary | ICD-10-CM | POA: Diagnosis present

## 2017-08-23 DIAGNOSIS — Z9841 Cataract extraction status, right eye: Secondary | ICD-10-CM | POA: Diagnosis not present

## 2017-08-23 DIAGNOSIS — J189 Pneumonia, unspecified organism: Secondary | ICD-10-CM | POA: Diagnosis present

## 2017-08-23 DIAGNOSIS — E441 Mild protein-calorie malnutrition: Secondary | ICD-10-CM | POA: Diagnosis present

## 2017-08-23 DIAGNOSIS — Z8673 Personal history of transient ischemic attack (TIA), and cerebral infarction without residual deficits: Secondary | ICD-10-CM | POA: Diagnosis not present

## 2017-08-23 DIAGNOSIS — Z880 Allergy status to penicillin: Secondary | ICD-10-CM | POA: Diagnosis not present

## 2017-08-23 DIAGNOSIS — Z79899 Other long term (current) drug therapy: Secondary | ICD-10-CM | POA: Diagnosis not present

## 2017-08-23 DIAGNOSIS — R066 Hiccough: Secondary | ICD-10-CM

## 2017-08-23 DIAGNOSIS — Z881 Allergy status to other antibiotic agents status: Secondary | ICD-10-CM | POA: Diagnosis not present

## 2017-08-23 DIAGNOSIS — E871 Hypo-osmolality and hyponatremia: Secondary | ICD-10-CM | POA: Diagnosis present

## 2017-08-23 DIAGNOSIS — R630 Anorexia: Secondary | ICD-10-CM | POA: Diagnosis present

## 2017-08-23 DIAGNOSIS — N400 Enlarged prostate without lower urinary tract symptoms: Secondary | ICD-10-CM | POA: Diagnosis present

## 2017-08-23 DIAGNOSIS — D72829 Elevated white blood cell count, unspecified: Secondary | ICD-10-CM | POA: Diagnosis present

## 2017-08-23 DIAGNOSIS — Z886 Allergy status to analgesic agent status: Secondary | ICD-10-CM | POA: Diagnosis not present

## 2017-08-23 DIAGNOSIS — A419 Sepsis, unspecified organism: Secondary | ICD-10-CM | POA: Diagnosis present

## 2017-08-23 DIAGNOSIS — Z87891 Personal history of nicotine dependence: Secondary | ICD-10-CM | POA: Diagnosis not present

## 2017-08-23 DIAGNOSIS — K219 Gastro-esophageal reflux disease without esophagitis: Secondary | ICD-10-CM | POA: Diagnosis present

## 2017-08-23 DIAGNOSIS — Z6831 Body mass index (BMI) 31.0-31.9, adult: Secondary | ICD-10-CM | POA: Diagnosis not present

## 2017-08-23 DIAGNOSIS — Z9842 Cataract extraction status, left eye: Secondary | ICD-10-CM | POA: Diagnosis not present

## 2017-08-23 DIAGNOSIS — R739 Hyperglycemia, unspecified: Secondary | ICD-10-CM | POA: Diagnosis present

## 2017-08-23 DIAGNOSIS — R8271 Bacteriuria: Secondary | ICD-10-CM | POA: Diagnosis not present

## 2017-08-23 HISTORY — DX: Pneumonitis due to inhalation of food and vomit: J69.0

## 2017-08-23 LAB — CBC WITH DIFFERENTIAL/PLATELET
ABS IMMATURE GRANULOCYTES: 0.1 10*3/uL (ref 0.0–0.1)
BASOS ABS: 0 10*3/uL (ref 0.0–0.1)
BASOS PCT: 0 %
Eosinophils Absolute: 0.1 10*3/uL (ref 0.0–0.7)
Eosinophils Relative: 1 %
HCT: 35.1 % — ABNORMAL LOW (ref 39.0–52.0)
HEMOGLOBIN: 11.8 g/dL — AB (ref 13.0–17.0)
Immature Granulocytes: 1 %
LYMPHS PCT: 9 %
Lymphs Abs: 1.1 10*3/uL (ref 0.7–4.0)
MCH: 28.7 pg (ref 26.0–34.0)
MCHC: 33.6 g/dL (ref 30.0–36.0)
MCV: 85.4 fL (ref 78.0–100.0)
Monocytes Absolute: 0.9 10*3/uL (ref 0.1–1.0)
Monocytes Relative: 7 %
NEUTROS ABS: 9.8 10*3/uL — AB (ref 1.7–7.7)
NEUTROS PCT: 82 %
PLATELETS: 317 10*3/uL (ref 150–400)
RBC: 4.11 MIL/uL — AB (ref 4.22–5.81)
RDW: 13.4 % (ref 11.5–15.5)
WBC: 12 10*3/uL — ABNORMAL HIGH (ref 4.0–10.5)

## 2017-08-23 LAB — LIPID PANEL
CHOLESTEROL: 117 mg/dL (ref 0–200)
HDL: 28 mg/dL — ABNORMAL LOW (ref >40)
LDL CALC: 78 mg/dL (ref 0–99)
TRIGLYCERIDES: 55 mg/dL (ref <150)
Total CHOL/HDL Ratio: 4.2 RATIO
VLDL: 11 mg/dL (ref 0–40)

## 2017-08-23 LAB — BASIC METABOLIC PANEL
ANION GAP: 12 (ref 5–15)
ANION GAP: 9 (ref 5–15)
BUN: 7 mg/dL (ref 6–20)
BUN: 6 mg/dL (ref 6–20)
CALCIUM: 8.6 mg/dL — AB (ref 8.9–10.3)
CALCIUM: 8.7 mg/dL — AB (ref 8.9–10.3)
CO2: 29 mmol/L (ref 22–32)
CO2: 30 mmol/L (ref 22–32)
Chloride: 90 mmol/L — ABNORMAL LOW (ref 101–111)
Chloride: 94 mmol/L — ABNORMAL LOW (ref 101–111)
Creatinine, Ser: 1.14 mg/dL (ref 0.61–1.24)
Creatinine, Ser: 0.98 mg/dL (ref 0.61–1.24)
GLUCOSE: 153 mg/dL — AB (ref 65–99)
GLUCOSE: 167 mg/dL — AB (ref 65–99)
POTASSIUM: 2.8 mmol/L — AB (ref 3.5–5.1)
POTASSIUM: 3.2 mmol/L — AB (ref 3.5–5.1)
SODIUM: 131 mmol/L — AB (ref 135–145)
Sodium: 133 mmol/L — ABNORMAL LOW (ref 135–145)

## 2017-08-23 LAB — HEMOGLOBIN A1C
HEMOGLOBIN A1C: 6 % — AB (ref 4.8–5.6)
MEAN PLASMA GLUCOSE: 125.5 mg/dL

## 2017-08-23 LAB — TSH: TSH: 0.365 u[IU]/mL (ref 0.350–4.500)

## 2017-08-23 LAB — TROPONIN I: Troponin I: <0.03 ng/mL (ref <0.03)

## 2017-08-23 LAB — MAGNESIUM: MAGNESIUM: 1.6 mg/dL — AB (ref 1.7–2.4)

## 2017-08-23 LAB — LACTIC ACID, PLASMA: LACTIC ACID, VENOUS: 0.9 mmol/L (ref 0.5–1.9)

## 2017-08-23 LAB — OCCULT BLOOD X 1 CARD TO LAB, STOOL: Fecal Occult Bld: NEGATIVE

## 2017-08-23 MED ORDER — POTASSIUM CHLORIDE 10 MEQ/100ML IV SOLN
10.0000 meq | INTRAVENOUS | Status: AC
  Administered 2017-08-23 (×4): 10 meq via INTRAVENOUS
  Filled 2017-08-23 (×4): qty 100

## 2017-08-23 MED ORDER — CIPROFLOXACIN HCL 500 MG PO TABS: 500.0000 mg | ORAL_TABLET | Freq: Two times a day (BID) | ORAL | Status: DC

## 2017-08-23 MED ORDER — ONDANSETRON HCL 4 MG PO TABS
4.0000 mg | ORAL_TABLET | Freq: Four times a day (QID) | ORAL | Status: DC | PRN
Start: 2017-08-23 — End: 2017-08-24

## 2017-08-23 MED ORDER — CIPROFLOXACIN IN D5W 400 MG/200ML IV SOLN
400.0000 mg | Freq: Two times a day (BID) | INTRAVENOUS | Status: DC
  Filled 2017-08-23: qty 200

## 2017-08-23 MED ORDER — ONDANSETRON HCL 4 MG/2ML IJ SOLN: 4.0000 mg | Freq: Four times a day (QID) | INTRAMUSCULAR | Status: DC | PRN

## 2017-08-23 MED ORDER — LEVOFLOXACIN IN D5W 750 MG/150ML IV SOLN
750.0000 mg | INTRAVENOUS | Status: DC
  Administered 2017-08-23 – 2017-08-24 (×2): 750 mg via INTRAVENOUS
  Filled 2017-08-23 (×3): qty 150

## 2017-08-23 MED ORDER — ENOXAPARIN SODIUM 40 MG/0.4ML ~~LOC~~ SOLN
40.0000 mg | SUBCUTANEOUS | Status: DC
  Administered 2017-08-24: 40 mg via SUBCUTANEOUS
  Filled 2017-08-23 (×2): qty 0.4

## 2017-08-23 MED ORDER — ACETAMINOPHEN 650 MG RE SUPP: 650.0000 mg | Freq: Four times a day (QID) | RECTAL | Status: DC | PRN

## 2017-08-23 MED ORDER — HYDRALAZINE HCL 20 MG/ML IJ SOLN: 5.0000 mg | Freq: Four times a day (QID) | INTRAMUSCULAR | Status: DC | PRN

## 2017-08-23 MED ORDER — POTASSIUM CHLORIDE 20 MEQ PO PACK
40.0000 meq | PACK | Freq: Once | ORAL | Status: DC
  Filled 2017-08-23: qty 2

## 2017-08-23 MED ORDER — SODIUM CHLORIDE 0.9 % IV SOLN
INTRAVENOUS | Status: DC
  Administered 2017-08-23: 13:00:00 via INTRAVENOUS

## 2017-08-23 MED ORDER — POTASSIUM CHLORIDE CRYS ER 20 MEQ PO TBCR
40.0000 meq | EXTENDED_RELEASE_TABLET | Freq: Once | ORAL | Status: AC
  Administered 2017-08-23: 40 meq via ORAL
  Filled 2017-08-23: qty 2

## 2017-08-23 MED ORDER — SIMETHICONE 80 MG PO CHEW
80.0000 mg | CHEWABLE_TABLET | Freq: Four times a day (QID) | ORAL | Status: DC
  Administered 2017-08-24: 80 mg via ORAL
  Filled 2017-08-23 (×3): qty 1

## 2017-08-23 MED ORDER — PANTOPRAZOLE SODIUM 40 MG PO TBEC
40.0000 mg | DELAYED_RELEASE_TABLET | Freq: Two times a day (BID) | ORAL | Status: DC
  Administered 2017-08-23 – 2017-08-24 (×3): 40 mg via ORAL
  Filled 2017-08-23 (×3): qty 1

## 2017-08-23 MED ORDER — ACETAMINOPHEN 325 MG PO TABS: 650.0000 mg | ORAL_TABLET | Freq: Four times a day (QID) | ORAL | Status: DC | PRN

## 2017-08-23 MED ORDER — POTASSIUM CHLORIDE 10 MEQ/100ML IV SOLN
10.0000 meq | Freq: Once | INTRAVENOUS | Status: AC
  Administered 2017-08-23: 10 meq via INTRAVENOUS
  Filled 2017-08-23: qty 100

## 2017-08-23 MED ORDER — PANTOPRAZOLE SODIUM 40 MG PO TBEC: 40.0000 mg | DELAYED_RELEASE_TABLET | Freq: Every day | ORAL | Status: DC

## 2017-08-23 MED ORDER — MAGNESIUM SULFATE 2 GM/50ML IV SOLN
2.0000 g | Freq: Once | INTRAVENOUS | Status: AC
  Administered 2017-08-23: 2 g via INTRAVENOUS
  Filled 2017-08-23: qty 50

## 2017-08-23 NOTE — Progress Notes (Signed)
Family Medicine Teaching Service Daily Progress Note Intern Pager: [phone]  Patient name: Steven Simmons Medical record number: [id number] Date of birth: 03-Aug-1940 Age: 77 y.o. Gender: male  Primary Care Provider: Bufford Lope, DO Consultants: GI Code Status: full  Pt Overview and Major Events to Date:  5/20 admitted to fpts  Assessment and Plan: Merrik Puebla is a 77 y.o. male presenting with ~3 week history of intractable GERD leading to decreased PO intake. Admitted due to multiple electrolyte abnormalities. UA consistent with UTI.  Intractable GERD  Nausea Patient with known chronic GERD that has continually worsened over the past three weeks. Will discuss with [facility] if any intervention is indicated now that he has received this CT Abdomen and pelvis. Continue PPI with prns for breakthrough. Feeling somewhat improved this morning. Has not experienced the burning sensation on protonix since admission. - vital signs per floor routine - continue protonix 40mg  daily, can possibly increase to BID if needed - Will contact [facility] for recommendations - Maalox or tums prn for resistant GERD symptoms - regular diet for now - NS @ 100 mL/hr - zofran 4mg  q 8 hours for nausea - lovenox for dvt ppx  Urinary tract infection Patient with positive nitrites and Moderate leuks on UA. Received dose of ciprofloxacin IV. Will transition to po ciprofloxacin this am. 500mg  bid. Burning sensation has resolved. - continue cipro 500mg  bid oral - follow up urine culture - monitor fever curve, vital signs  Hypokalemia Patient with potassium of 3.0. Down to 2.8. Patient unable to tolerate large kdur pill so will replete w/ IV potassium - 10 meq IV kCL - bmp in afternoon (1700), if still low can give another dose of kcl.  Hyponatremia Na of 127 on admission. Improved to 131 on 5/21. Likely due to poor PO intake over last month. Will need to continue slow repletion with NS @ rate of  140mL/hr. - NS @ 173mL/hr - daily bmp  Cough  peripheral right airspace disease Likely etiology for patient's cough is 2/2 to his intractable GERD. Given that he has had episodes of violent coughing while laying down, he may have a small bit of aspiration. This remains most likely explanation for his CT scan finding. Patient afebrile and this area did not appear on CXR. Will monitor but it will likely resolve over time.  Hypertension Given electrolyte abnormalities will hold home hctz and lisinopril. Pressures in 120-140 SBP range in ED. 121/62 in am of 5/21. If becomes hypertensive will add on prn hydralazine. - holding hctz/lisinopril - hydralazine prn for SBP>180 - vital signs per floor routine  Hyperglycemia A1C 6.0. Glucose 153 in am. Will continue to follow, will consider starting SSI if persistently in upper 100s.  Hyperlipidemia Patient with history of elevated cholesterol and elevated LDL in past. Has refused statin therapy in the past. Would benefit from statin therapy. Will get lipid panel as no new lab results for cholesterol since 2017.  Mild Protein calorie Malnutrition Albumin 3.0. Can supplement meals with ensure shakes.  FEN/GI: regular diet, NS @ 14mL/hr Prophylaxis: lovenox for dvt ppx  Disposition: likely home  Subjective:  Feeling better this morning. No concerns, interested in hearing back from GI.  Objective: Temp:  [98.1 F (36.7 C)-98.9 F (37.2 C)] 98.1 F (36.7 C) (05/21 0433) Pulse Rate:  [76-113] 86 (05/21 0433) Resp:  [16-19] 16 (05/21 0433) BP: (121-166)/(52-88) 121/62 (05/21 0433) SpO2:  [92 %-99 %] 94 % (05/21 0433) Weight:  [220 lb 7.4  oz (100 kg)] 220 lb 7.4 oz (100 kg) (05/21 0042) Physical Exam: General: NAD, resting comfortably, sitting up in bed. Obese, caucasian male Cardiovascular: rrr, no m/r/g. Palpable pt/dpt bilaterally. Palpable radial pulse bilaterally Respiratory: Lungs Clear To Ausculation Bilaterally,  no increased  wob, mild non-productive cough noted Gastrointestinal: soft, NT, ND.  Bowel sounds present MSK: 5/5 strength BUE, BLE. Derm: numerous seborrheic keratosis noted in abdomen and back Neuro: aox3, no focal neuro deficits Psych: appropriate   Laboratory: Recent Labs  Lab 08/22/17 1449 08/23/17 0230  WBC 15.4* 12.0*  HGB 12.8* 11.8*  HCT 37.3* 35.1*  PLT 406* 317   Recent Labs  Lab 08/22/17 1449 08/23/17 0230  NA 127* 131*  K 3.0* 2.8*  CL 88* 90*  CO2 26 29  BUN 7 7  CREATININE 0.96 1.14  CALCIUM 8.9 8.6*  PROT 7.4  --   BILITOT 0.9  --   ALKPHOS 82  --   ALT 41  --   AST 37  --   GLUCOSE 169* 153*    Imaging/Diagnostic Tests: CLINICAL DATA:  Nausea. Abdominal pain. Acid reflux. Abdominal distension.  EXAM: CT ABDOMEN AND PELVIS WITH CONTRAST  TECHNIQUE: Multidetector CT imaging of the abdomen and pelvis was performed using the standard protocol following bolus administration of intravenous contrast.  CONTRAST:  1103mL OMNIPAQUE IOHEXOL 300 MG/ML  SOLN  COMPARISON:  10/31/2009  FINDINGS: Lower chest: Patchy right lower lobe airspace disease with minimal probable atelectasis at the left lung base laterally. Mild cardiomegaly, without pericardial or pleural effusion.  Hepatobiliary: Mild motion degradation throughout. Normal liver. Cholecystectomy, without biliary ductal dilatation.  Pancreas: Normal, without mass or ductal dilatation.  Spleen: Normal in size, without focal abnormality.  Adrenals/Urinary Tract: Mild left adrenal thickening. Normal right adrenal gland. Lower pole left renal cyst. Bilateral too small to characterize renal lesions. Normal urinary bladder.  Stomach/Bowel: The proximal stomach is underdistended. Apparent wall thickening, including on image 18/3, is at least partially felt to be secondary.  Scattered colonic diverticula. Normal terminal ileum and appendix. Normal small bowel.  Vascular/Lymphatic: Aortic and  branch vessel atherosclerosis. No abdominopelvic adenopathy.  Reproductive: Mild prostatomegaly.  Other: No significant free fluid.  No free intraperitoneal air.  Musculoskeletal: Degenerative partial fusion of the bilateral sacroiliac joints. Lumbosacral spondylosis.  IMPRESSION: 1. Mild motion degradation. 2.  No acute process in the abdomen or pelvis. 3. Apparent proximal gastric wall thickening is favored to be due to underdistention. Gastritis cannot be excluded. 4.  Aortic Atherosclerosis (ICD10-I70.0). 5. Prostatomegaly. 6. Peripheral right base airspace disease is indeterminate but new since 2011. Considerations include infection/aspiration or interval scarring. Correlate with acute pulmonary symptoms.  Guadalupe Dawn, MD 08/23/2017, 7:09 AM PGY-1, [place] Intern pager: [phone], text pages welcome

## 2017-08-23 NOTE — Progress Notes (Signed)
Received patient from ED, AOx4, ambulatory, slightly elevated BP at 166/81 but no pain and O2Sat at 96% on RA, oriented to room, bed controls and call light.  Gave ginger ale to drink. Patient now resting on bed, will monitor.

## 2017-08-23 NOTE — ED Notes (Signed)
Patient is stable and ready to be transport to the floor at this time.  Report was called to 6N RN.  Belongings taken with the patient to the floor.   

## 2017-08-24 DIAGNOSIS — R319 Hematuria, unspecified: Secondary | ICD-10-CM

## 2017-08-24 DIAGNOSIS — R630 Anorexia: Secondary | ICD-10-CM

## 2017-08-24 DIAGNOSIS — E876 Hypokalemia: Secondary | ICD-10-CM

## 2017-08-24 DIAGNOSIS — N390 Urinary tract infection, site not specified: Secondary | ICD-10-CM

## 2017-08-24 DIAGNOSIS — R1013 Epigastric pain: Secondary | ICD-10-CM

## 2017-08-24 DIAGNOSIS — K219 Gastro-esophageal reflux disease without esophagitis: Secondary | ICD-10-CM

## 2017-08-24 DIAGNOSIS — I10 Essential (primary) hypertension: Secondary | ICD-10-CM

## 2017-08-24 LAB — CBC
HCT: 34.3 % — ABNORMAL LOW (ref 39.0–52.0)
Hemoglobin: 11.5 g/dL — ABNORMAL LOW (ref 13.0–17.0)
MCH: 28.8 pg (ref 26.0–34.0)
MCHC: 33.5 g/dL (ref 30.0–36.0)
MCV: 86 fL (ref 78.0–100.0)
Platelets: 310 10*3/uL (ref 150–400)
RBC: 3.99 MIL/uL — ABNORMAL LOW (ref 4.22–5.81)
RDW: 13.4 % (ref 11.5–15.5)
WBC: 9.6 10*3/uL (ref 4.0–10.5)

## 2017-08-24 LAB — BASIC METABOLIC PANEL
ANION GAP: 9 (ref 5–15)
BUN: 5 mg/dL — AB (ref 6–20)
CALCIUM: 8.7 mg/dL — AB (ref 8.9–10.3)
CO2: 27 mmol/L (ref 22–32)
Chloride: 99 mmol/L — ABNORMAL LOW (ref 101–111)
Creatinine, Ser: 0.93 mg/dL (ref 0.61–1.24)
GFR calc Af Amer: >60 mL/min (ref >60)
GFR calc non Af Amer: >60 mL/min (ref >60)
GLUCOSE: 168 mg/dL — AB (ref 65–99)
POTASSIUM: 3 mmol/L — AB (ref 3.5–5.1)
Sodium: 135 mmol/L (ref 135–145)

## 2017-08-24 LAB — H. PYLORI ANTIGEN, STOOL: H. Pylori Stool Ag, Eia: NEGATIVE

## 2017-08-24 LAB — MAGNESIUM: Magnesium: 1.9 mg/dL (ref 1.7–2.4)

## 2017-08-24 MED ORDER — LEVOFLOXACIN 750 MG PO TABS: 750.0000 mg | ORAL_TABLET | Freq: Every day | ORAL | 0 refills | Status: AC

## 2017-08-24 MED ORDER — LEVOFLOXACIN IN D5W 750 MG/150ML IV SOLN: 750.0000 mg | INTRAVENOUS | 0 refills | Status: DC

## 2017-08-24 MED ORDER — POTASSIUM CHLORIDE 20 MEQ/15ML (10%) PO SOLN
40.0000 meq | Freq: Every day | ORAL | Status: DC
  Administered 2017-08-24: 40 meq via ORAL
  Filled 2017-08-24: qty 30

## 2017-08-24 MED ORDER — POTASSIUM CHLORIDE 10 MEQ/100ML IV SOLN: 10.0000 meq | INTRAVENOUS | Status: DC

## 2017-08-24 MED ORDER — POTASSIUM CHLORIDE 20 MEQ/15ML (10%) PO SOLN: 40.0000 meq | Freq: Every day | ORAL | Status: DC

## 2017-08-24 MED ORDER — PANTOPRAZOLE SODIUM 40 MG PO TBEC: 40.0000 mg | DELAYED_RELEASE_TABLET | Freq: Two times a day (BID) | ORAL | 0 refills | Status: DC

## 2017-08-24 MED ORDER — POTASSIUM CHLORIDE ER 20 MEQ PO TBCR: 20.0000 meq | EXTENDED_RELEASE_TABLET | Freq: Two times a day (BID) | ORAL | 0 refills | Status: DC

## 2017-08-24 NOTE — Progress Notes (Signed)
Pt declined iv potassium until he discusses with the MDs. MD paged and notified

## 2017-08-24 NOTE — Discharge Summary (Addendum)
[facility] Discharge Summary  Patient name: Steven Simmons Medical record number: [id number] Date of birth: 1940-08-25 Age: 77 y.o. Gender: male Date of Admission: 08/22/2017  Date of Discharge: 08/24/2017 Admitting Physician: Blane Ohara McDiarmid, MD  Primary Care Provider: Bufford Lope, DO Consultants: GI via phonecall  Indication for Hospitalization: poor PO intake, electrolyte abnormalities  Discharge Diagnoses/Problem List:  Intractable GERD Nausea UTI Hypokalemia Hyponatremia Cough Peripheral right airspace disease Hypertension Hyperglycemia Hyperlipidemia Mild protein calorie malnutrition  Disposition: home  Discharge Condition: stable  Discharge Exam: General:NAD, resting comfortably, sitting up in bed. Obese, caucasian male Cardiovascular: rrr, no m/r/g. Palpable pt/dpt bilaterally. Palpable radial pulse bilaterally Respiratory:Lungs Clear To Ausculation Bilaterally,  no increased wob, mild non-productive cough noted Gastrointestinal:soft, NT, ND.  Bowel sounds present MSK:5/5 strength BUE, BLE. Derm:numerous seborrheic keratosis noted in abdomen and back Neuro:aox3, no focal neuro deficits Psych:appropriate  Brief Hospital Course:  77 year old who presented on 5/20 with decreased PO intake for 1 month prior to admission. Patient stated that he had not been eating because of terrible burning with any PO intake 2/2 intractable GERD. Prior to admission he had been seen multiple times in last month at both urgent care and the ED. He was started on protonix and had CT abdomen and pelvis scheduled at the request of Dr. Watt Climes of [facility]. He presented with hypokalemia (3.0) and hyponatremia (128) and was admitted for these electrolyte abnormalities on top of his poor PO intake. He also had mild burning with urination and had a UA with +nitrites.  GERD Case discussed on 5/21 with Dr. Watt Climes who recommended increasing dose of protonix to 40mg   bid. After doubling his dose the patient was then able to tolerate good PO intake. Due to being able to tolerate good PO he was discharged on 5/22. He has follow up with GI scheduled on 5/31 for a presumed evaluation for EGD and colonscopy. Patient had an H pylori antigen still pending at time of discharge.  Hyponatremia Patient's sodium slowly corrected with normal saline infusion and was in normal range of 135 on 5/22.  Hypokalemia/hypomagnesemia Patient unable to tolerated kdur pill on admission. Was repleated with IV potassium which kept potassium stable at 3.0. Refused further dosing of IV potassium, switched to oral liquid potassium and was given prior to dc on 5/22. He will need follow up K at clinic. Patient with very slightly low magnesium of 1.6 on admission. Was repleated and up to 1.9 on 5/22.  UTI Patient initially started on ciprofloxacin at admission on 5/20. Patient cough and questionable CT scan finding of peripheral RLL consolidation. Was switched to levoquin on 5/21 to give coverage against bacteria associated with aspiration. Discharged home to complete 7 day course.  Issues for Follow Up:  1. Check Potassium, magnesium, sodium 2. Make sure patient follows up with GI 3. Make Sure patient is still tolerating good PO on increased protonix 4. Ensure patient finished course of levoquin 5. F/U H. Pylori antigen result  Significant Procedures:  Significant Labs and Imaging:  Recent Labs  Lab 08/22/17 1449 08/23/17 0230 08/24/17 0421  WBC 15.4* 12.0* 9.6  HGB 12.8* 11.8* 11.5*  HCT 37.3* 35.1* 34.3*  PLT 406* 317 310   Recent Labs  Lab 08/22/17 1449 08/23/17 0230 08/23/17 1056 08/23/17 1804 08/24/17 0421  NA 127* 131*  --  133* 135  K 3.0* 2.8*  --  3.2* 3.0*  CL 88* 90*  --  94* 99*  CO2  26 29  --  30 27  GLUCOSE 169* 153*  --  167* 168*  BUN 7 7  --  6 5*  CREATININE 0.96 1.14  --  0.98 0.93  CALCIUM 8.9 8.6*  --  8.7* 8.7*  MG  --   --  1.6*  --  1.9   ALKPHOS 82  --   --   --   --   AST 37  --   --   --   --   ALT 41  --   --   --   --   ALBUMIN 3.0*  --   --   --   --     Results/Tests Pending at Time of Discharge:   Discharge Medications:  Allergies as of 08/24/2017      Reactions   Ceclor [cefaclor] Other (See Comments)   Makes my heart slow and I feel like I am fading away   Veralipride    Gums bleed, tooth aches   Penicillins Other (See Comments)   Has patient had a PCN reaction causing immediate rash, facial/tongue/throat swelling, SOB or lightheadedness with hypotension: yes- only rash Has patient had a PCN reaction causing severe rash involving mucus membranes or skin necrosis: No Has patient had a PCN reaction that required hospitalization: No Has patient had a PCN reaction occurring within the last 10 years: No If all of the above answers are "NO", then may proceed with Cephalosporin use.   Amlodipine Other (See Comments)   Gum bleeding   Antihistamines, Chlorpheniramine-type Other (See Comments)   Dizziness and nose bleeds   Beta Adrenergic Blockers Other (See Comments)   Gums bleed   Celebrex [celecoxib] Other (See Comments)   Stomach pain   Ibuprofen Other (See Comments)   Stomach pain     Lipitor [atorvastatin] Hypertension   Nutrasweet Aspartame [aspartame] Other (See Comments)   Memory loss      Medication List    STOP taking these medications   omeprazole 20 MG capsule Commonly known as:  PRILOSEC   PRESCRIPTION MEDICATION     TAKE these medications   levofloxacin 750 MG/150ML Soln Commonly known as:  LEVAQUIN Inject 150 mLs (750 mg total) into the vein daily for 4 days.   lisinopril-hydrochlorothiazide 20-12.5 MG tablet Commonly known as:  PRINZIDE,ZESTORETIC Take 2 tablets by mouth daily.   pantoprazole 40 MG tablet Commonly known as:  PROTONIX Take 1 tablet (40 mg total) by mouth 2 (two) times daily. What changed:  when to take this   Potassium Chloride ER 20 MEQ Tbcr Take 20 mEq by  mouth 2 (two) times daily.       Discharge Instructions: Please refer to Patient Instructions section of EMR for full details.  Patient was counseled important signs and symptoms that should prompt return to medical care, changes in medications, dietary instructions, activity restrictions, and follow up appointments.   Follow-Up Appointments: Please call [facility] to schedule appointment  Guadalupe Dawn, MD 08/24/2017, 10:09 AM PGY-1, [place]

## 2017-08-24 NOTE — Progress Notes (Signed)
Pt discharged home in stable condition. Discharge teaching and AVS given before leaving unit

## 2017-08-25 ENCOUNTER — Other Ambulatory Visit: Payer: Medicare Other

## 2017-08-30 ENCOUNTER — Inpatient Hospital Stay: Payer: Medicare Other | Admitting: Internal Medicine

## 2017-09-02 ENCOUNTER — Ambulatory Visit (INDEPENDENT_AMBULATORY_CARE_PROVIDER_SITE_OTHER): Payer: Medicare Other | Admitting: Nurse Practitioner

## 2017-09-02 ENCOUNTER — Encounter: Payer: Self-pay | Admitting: Nurse Practitioner

## 2017-09-02 ENCOUNTER — Encounter

## 2017-09-02 VITALS — BP 100/70 | HR 108 | Ht 70.0 in

## 2017-09-02 DIAGNOSIS — Z800 Family history of malignant neoplasm of digestive organs: Secondary | ICD-10-CM

## 2017-09-02 DIAGNOSIS — R110 Nausea: Secondary | ICD-10-CM | POA: Diagnosis not present

## 2017-09-02 DIAGNOSIS — K219 Gastro-esophageal reflux disease without esophagitis: Secondary | ICD-10-CM | POA: Diagnosis not present

## 2017-09-02 DIAGNOSIS — B370 Candidal stomatitis: Secondary | ICD-10-CM

## 2017-09-02 DIAGNOSIS — R1013 Epigastric pain: Secondary | ICD-10-CM

## 2017-09-02 MED ORDER — RANITIDINE HCL 150 MG PO TABS: 150.0000 mg | ORAL_TABLET | Freq: Every day | ORAL | 3 refills | Status: DC

## 2017-09-02 MED ORDER — CLOTRIMAZOLE 10 MG MT TROC: 10.0000 mg | Freq: Every day | OROMUCOSAL | 0 refills | Status: DC

## 2017-09-02 MED ORDER — OMEPRAZOLE MAGNESIUM 20 MG PO TBEC: 20.0000 mg | DELAYED_RELEASE_TABLET | ORAL | 3 refills | Status: DC

## 2017-09-02 NOTE — Patient Instructions (Addendum)
If you are age 75 or older, your body mass index should be between 23-30. Your Body mass index is 31.63 kg/m. If this is out of the aforementioned range listed, please consider follow up with your Primary Care Provider.  If you are age 89 or younger, your body mass index should be between 19-25. Your Body mass index is 31.63 kg/m. If this is out of the aformentioned range listed, please consider follow up with your Primary Care Provider.   You have been scheduled for a colonoscopy. Please follow written instructions given to you at your visit today.  Please pick up your prep supplies at the pharmacy within the next 1-3 days. If you use inhalers (even only as needed), please bring them with you on the day of your procedure. Your physician has requested that you go to www.startemmi.com and enter the access code given to you at your visit today. This web site gives a general overview about your procedure. However, you should still follow specific instructions given to you by our office regarding your preparation for the procedure.  We have sent the following medications to your pharmacy for you to pick up at your convenience:  Mycelex Trouche Prilosec Zantac   Call if you can not afford Mycelex or Zantac.  Follow up with Tye Savoy the end of July.  We will contact you with an appointment.  Schedule is not available at this time.  Thank you for choosing me and [facility].   Tye Savoy, NP

## 2017-09-02 NOTE — Progress Notes (Addendum)
Chief Complaint:  Epigastric burning, GERD    ASSESSMENT AND PLAN;   1. 77 yo male with post-prandial chest / epigastric burning, nausea. Incomplete treatment for oral candida recently. Persistent oral candida on exam -He only completed 4 days of diflucan prescribed by PCP. Med helped sx but thought it gave him hiccups.  -Trial of Mycelex troches 5x daily for 10 days.  -HOB already elevated. Taking prilosec once daily. Having breakthrough heartburn, add Zantac 150 mg at bedtime -advised to call for a follow up with me in July to make sure upper GI sx have resolved. If they don't resolve he will need an EGD  2. [facility] of colon cancer. Overdue for surveillance colonoscopy.  -The risks and benefits of colonoscopy with possible polypectomy were discussed and the patient agrees to proceed.    HPI:    Patient is a 77 year old male with history of hypertension and TIA. He is known to Dr. Carlean Purl and has a family history of colon cancer . Screening colonoscopy 2011 remarkable for severe right and left-sided diverticulosis, otherwise normal.  Patient is overdue for 5 years surveillance colonoscopy.   The beginning of this month patient " belched" up part of a hamburger and subsequently developed throat pain.  Went to urgent care , diagnosed with oral Candida and given Diflucan.  Throat discomfort improved with Diflucan but he developed hiccups and stopped it after 4 days.  Tried to eat BBQ but developed burning in chest and upper abdomen associated with nausea and sob. Unable to eat he was evaluated twice in ED. Ultimately admitted with electrolyte disturbances, UTI and GERD sx. He has a history of GERD on daily PPI which was increased to twice daily in the hospital but only taking once daily. Still having heartburn. Weight is stable.   ED course:  Troponins were negative, EKG without acute changes. His stool for H. pylori antigen was negative.  CT scan of the abdomen and pelvis with contrast  suggested apparent gastric wall thickening felt to be more likely underdistention rather than gastritis.    Past Medical History:  Diagnosis Date  . Arthritis    shoulders (08/23/2017)  . Bleeding stomach ulcer 1988  . Complication of anesthesia 1993   " dont' know the name of it but they gave it to me at [facility] in 1993 "   I was cold, shaking and had a headache for hours afterwards "  . GERD (gastroesophageal reflux disease)   . Heart murmur    "when I was a child"  . History of blood transfusion 1988   "bleeding ulcer"    . History of TIA (transient ischemic attack) 07/2015   pt denies this hx on 08/23/2017  . Hypertension   . Migraine 1959-1980   "did have 4 ~ 2 wks ago; just the aura then" (08/23/2017)  . Peptic ulcer disease      Past Surgical History:  Procedure Laterality Date  . CATARACT EXTRACTION W/ INTRAOCULAR LENS  IMPLANT, BILATERAL Bilateral   . COLONOSCOPY  2012 ?  Marland Kitchen EXCISIONAL HEMORRHOIDECTOMY  1993  . LAPAROSCOPIC CHOLECYSTECTOMY     Family History  Problem Relation Age of Onset  . Heart disease Mother   . Hypertension Mother   . Heart disease Father   . Hypertension Father    Social History   Tobacco Use  . Smoking status: Former Smoker    Years: 30.00    Types: Cigarettes, Cigars    Last attempt to quit:  04/20/1977    Years since quitting: 40.3  . Smokeless tobacco: Never Used  Substance Use Topics  . Alcohol use: No    Comment: 08/23/2017 "used to drink alot of beer; can't stand it anymore; stopped in 1981"  . Drug use: No   Current Outpatient Medications  Medication Sig Dispense Refill  . lisinopril-hydrochlorothiazide (PRINZIDE,ZESTORETIC) 20-12.5 MG tablet Take 2 tablets by mouth daily. 60 tablet 3  . omeprazole (PRILOSEC OTC) 20 MG tablet Take 20 mg by mouth daily.    Marland Kitchen POTASSIUM PO Take 500 mg by mouth daily.     No current facility-administered medications for this visit.    Allergies  Allergen Reactions  . Ceclor [Cefaclor] Other  (See Comments)    Makes my heart slow and I feel like I am fading away  . Veralipride     Gums bleed, tooth aches  . Penicillins Other (See Comments)     Has patient had a PCN reaction causing immediate rash, facial/tongue/throat swelling, SOB or lightheadedness with hypotension: yes- only rash Has patient had a PCN reaction causing severe rash involving mucus membranes or skin necrosis: No Has patient had a PCN reaction that required hospitalization: No Has patient had a PCN reaction occurring within the last 10 years: No If all of the above answers are "NO", then may proceed with Cephalosporin use.  . Amlodipine Other (See Comments)    Gum bleeding  . Antihistamines, Chlorpheniramine-Type Other (See Comments)    Dizziness and nose bleeds  . Beta Adrenergic Blockers Other (See Comments)    Gums bleed  . Celebrex [Celecoxib] Other (See Comments)    Stomach pain  . Ibuprofen Other (See Comments)    Stomach pain    . Lipitor [Atorvastatin] Hypertension  . Nutrasweet Aspartame [Aspartame] Other (See Comments)    Memory loss     Review of Systems: Positive for cough . All other systems reviewed and negative except where noted in HPI.    Physical Exam:    Wt Readings from Last 3 Encounters:  08/23/17 220 lb 7.4 oz (100 kg)  01/20/17 221 lb (100.2 kg)  09/24/16 217 lb (98.4 kg)    BP 100/70   Pulse (!) 108   Ht 5\' 10"  (1.778 m)   BMI 31.63 kg/m  Constitutional:  Pleasant male in no acute distress. Psychiatric: Normal mood and affect. Behavior is normal. EENT: Pupils normal.  Conjunctivae are normal. No scleral icterus. Back of tongue with white bumps.  Neck supple.  Cardiovascular: Normal rate, regular rhythm. No edema Pulmonary/chest: Effort normal and breath sounds normal. No wheezing, rales or rhonchi. Abdominal: Soft, nondistended, nontender. Bowel sounds active throughout. There are no masses palpable. No hepatomegaly. Neurological: Alert and oriented to person place  and time. Skin: Skin is warm and dry. No rashes noted.  Tye Savoy, NP  09/02/2017, 9:52 AM  Agree with Ms. Raziel Koenigs's assessment and plan. Gatha Mayer, MD, [facility]

## 2017-09-06 ENCOUNTER — Encounter: Payer: Self-pay | Admitting: Nurse Practitioner

## 2017-09-06 ENCOUNTER — Telehealth: Payer: Self-pay | Admitting: Internal Medicine

## 2017-09-07 ENCOUNTER — Encounter: Payer: Self-pay | Admitting: Nurse Practitioner

## 2017-09-07 NOTE — Telephone Encounter (Signed)
Pt calling back states he cannot afford clotrimazole (MYCELEX) 10 MG troche is too expensive and is asking for alternative options. Best call back# [phone].

## 2017-09-08 ENCOUNTER — Other Ambulatory Visit: Payer: Self-pay

## 2017-09-08 NOTE — Telephone Encounter (Signed)
The best option is to restart Diflucan and see if hiccups recur. Does he still have remaining Diflucan and can he afford to pick up a few more pills? If so then Diflucan 200 mg 1st day followed by 100 mg daily for 6 days then I need to see him back in clinic to make sure thrush has resolved. Thanks

## 2017-09-08 NOTE — Telephone Encounter (Signed)
Patient says he was able to obtain his medication.

## 2017-09-08 NOTE — Telephone Encounter (Signed)
Left a message to call 

## 2017-09-11 IMAGING — CT CT HEAD W/O CM
1 series · 16 of 30 positions shown, 20 images · non-contrast
Comparison: 04/09/2014

CLINICAL DATA: Code stroke for confusion and blurred vision.
Headache behind the left eye.

EXAM:
CT HEAD WITHOUT CONTRAST
TECHNIQUE: Contiguous axial images were obtained from the base of the skull
through the vertex without intravenous contrast.

[Series 2: head 5.0 st · axial · 0.45mm/px · z∈[-157,+3]mm · 16 of 36 slices shown, 20 images]
[im 2/36  brain]
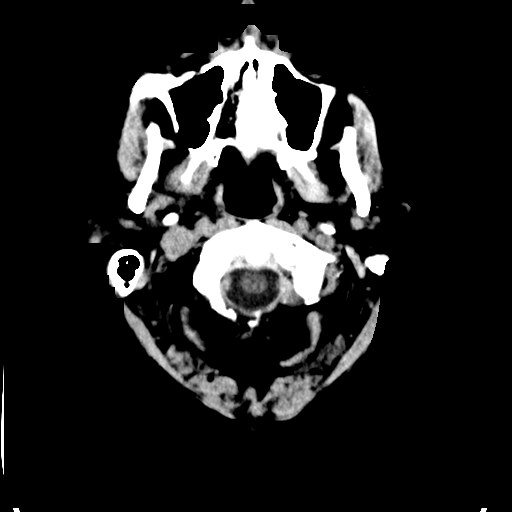
[im 2/36  bone]
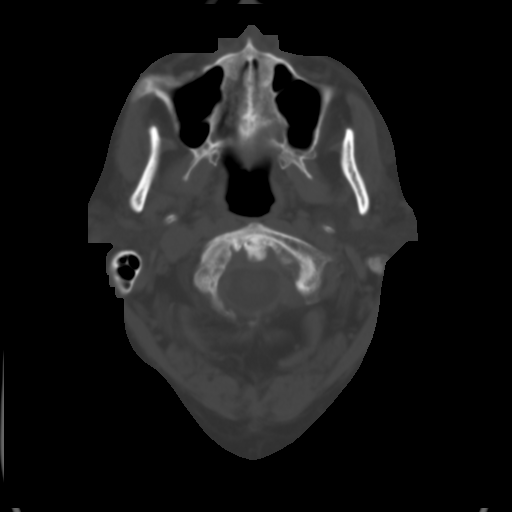
[im 4/36  brain]
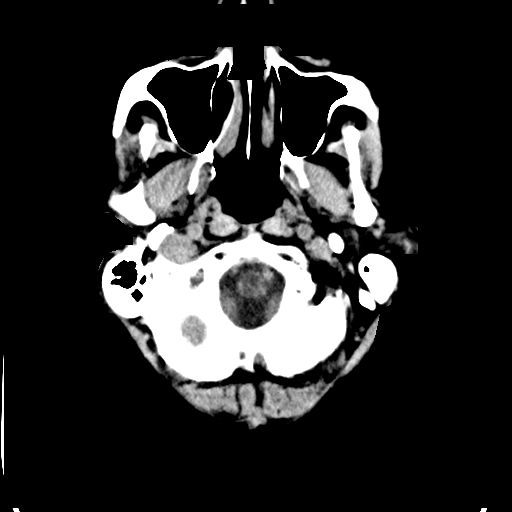
[im 7/36  brain]
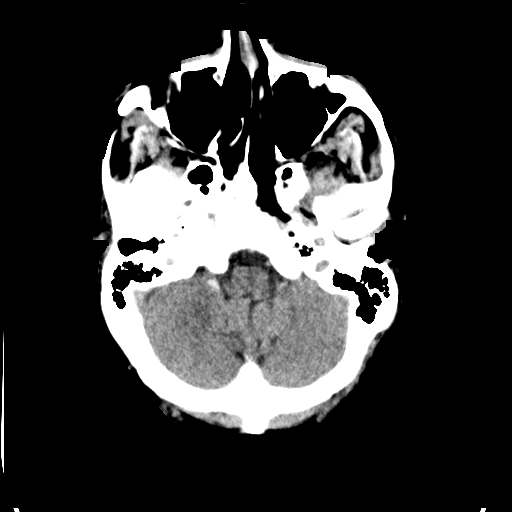
[im 9/36  brain]
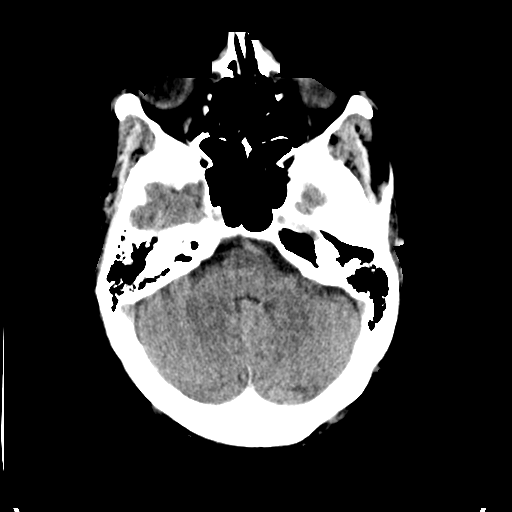
[im 10/36  brain]
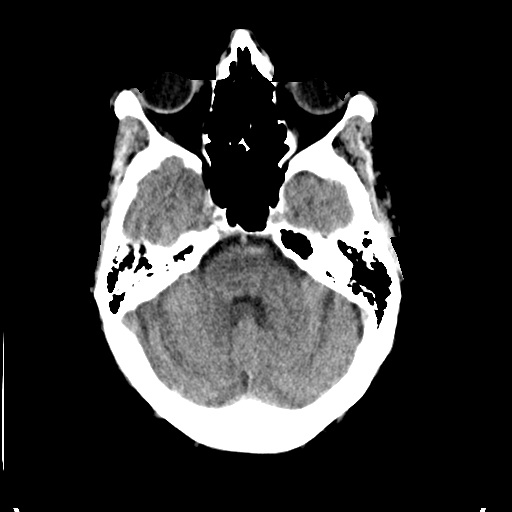
[im 10/36  bone]
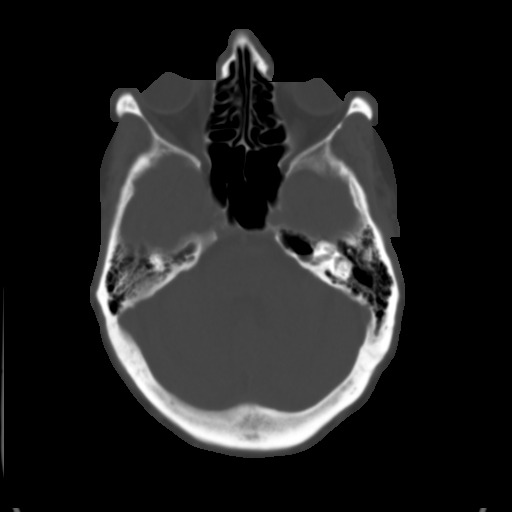
[im 13/36  brain]
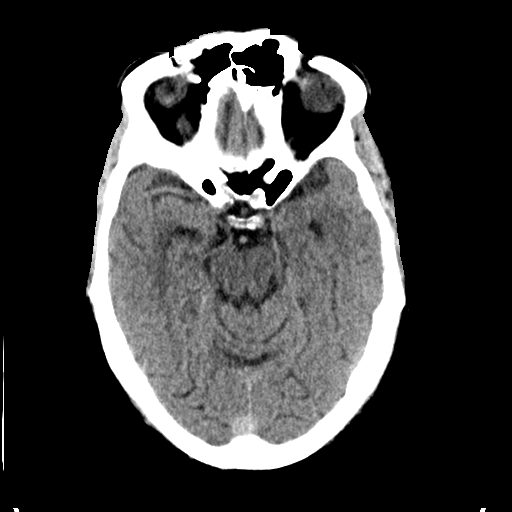
[im 15/36  brain]
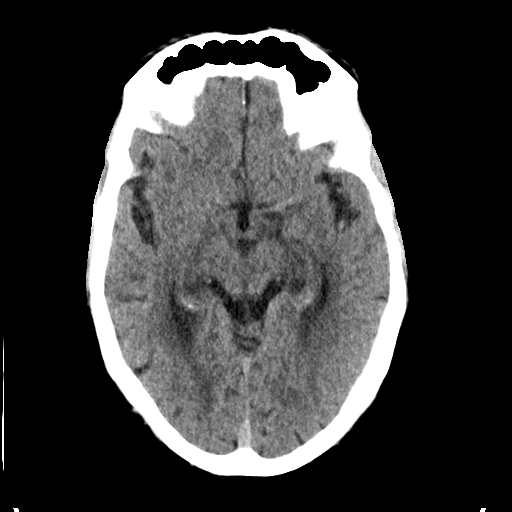
[im 17/36  brain]
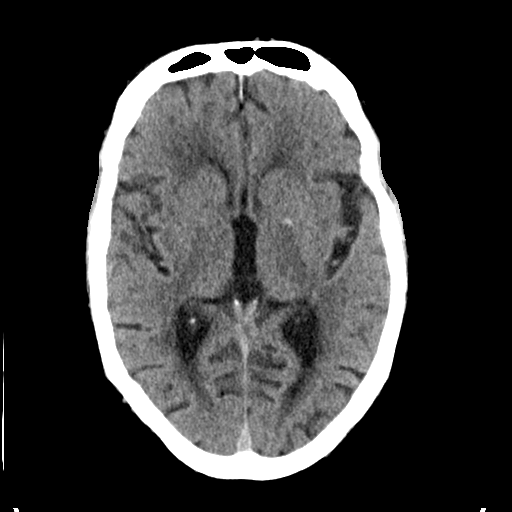
[im 19/36  brain]
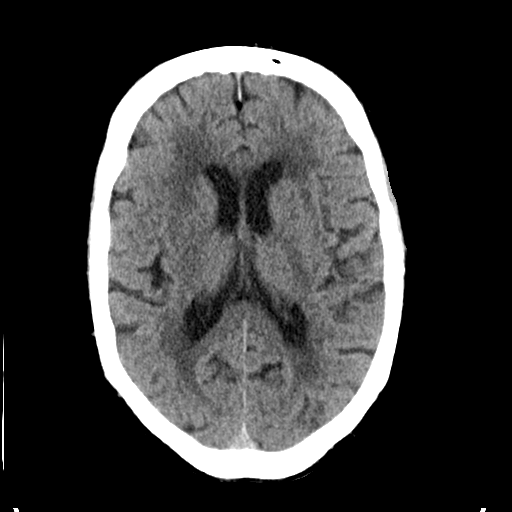
[im 19/36  bone]
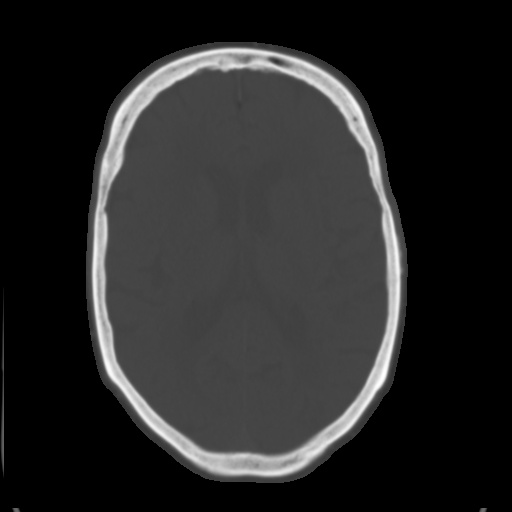
[im 21/36  brain]
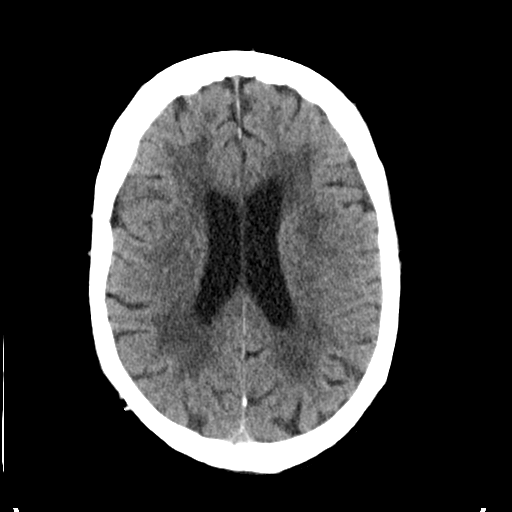
[im 23/36  brain]
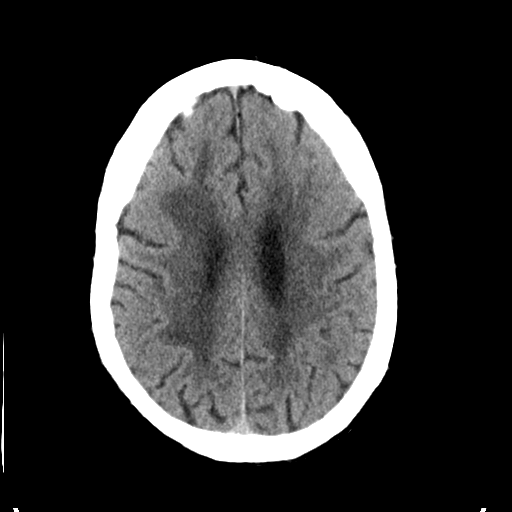
[im 26/36  brain]
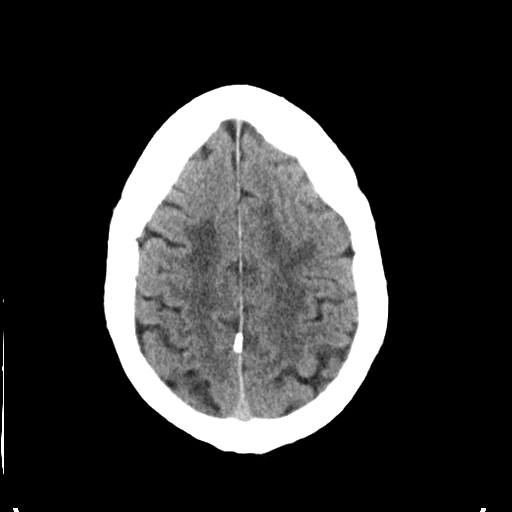
[im 27/36  brain]
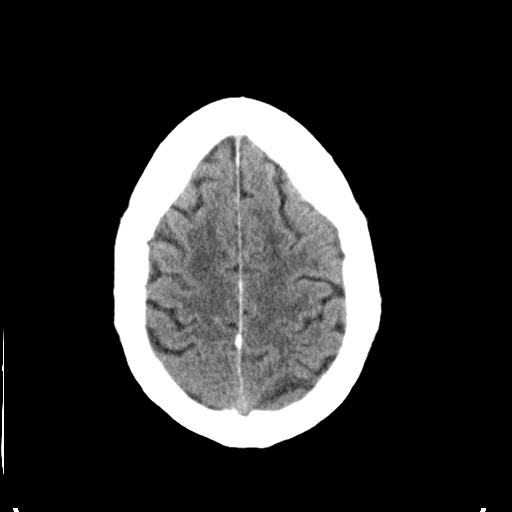
[im 27/36  bone]
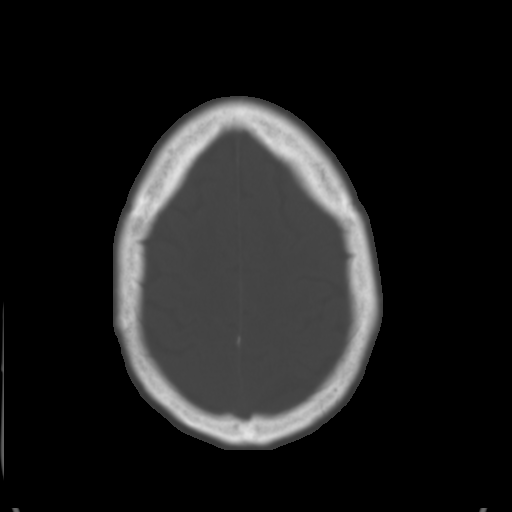
[im 29/36  brain]
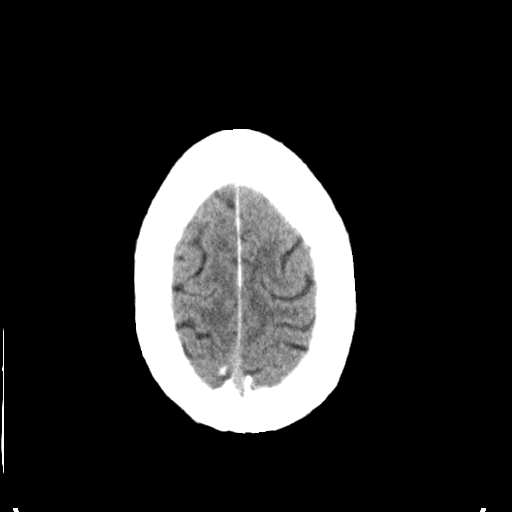
[im 32/36  brain]
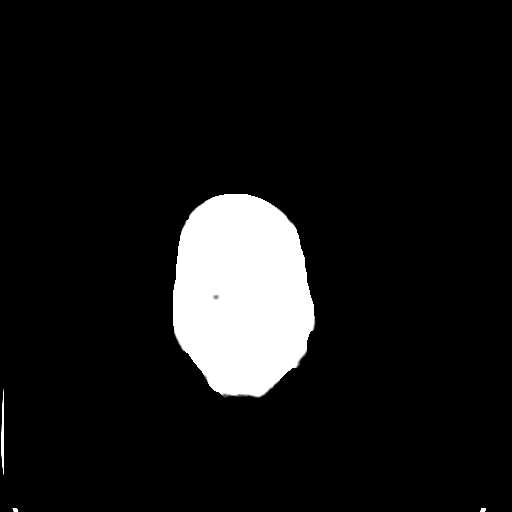
[im 34/36  brain]
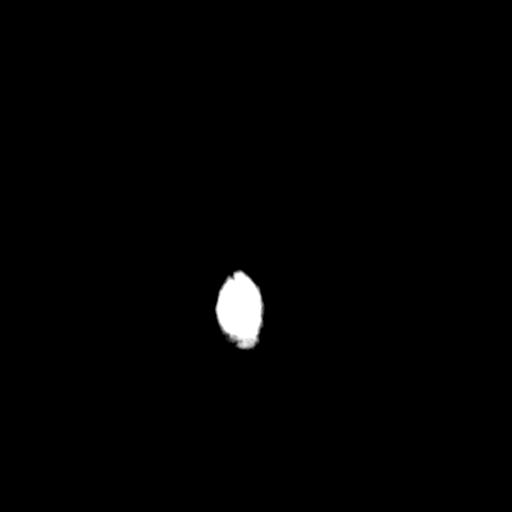

[16 of 30 positions shown; findings below may reference images not displayed]

FINDINGS: Skull and Sinuses:Negative for fracture or destructive process.

Visualized orbits: Bilateral cataract resection. No acute finding to
explain blurred vision.

Brain: No evidence of acute infarction, hemorrhage, hydrocephalus,
or mass lesion/mass effect. Chronic small vessel disease with stable
pattern. Small vessel ischemic gliosis is extensive, confluent
throughout the deep cerebral white matter. Normal cerebral volume.

These results were called by telephone at the time of interpretation
on 07/28/2015 at [DATE] to Dr. Dmitrievich, who verbally acknowledged these
results.
IMPRESSION: 1. No acute finding or change from prior.
2. Extensive chronic microvascular ischemia.

## 2017-10-10 ENCOUNTER — Telehealth: Payer: Self-pay | Admitting: Nurse Practitioner

## 2017-10-20 ENCOUNTER — Ambulatory Visit (INDEPENDENT_AMBULATORY_CARE_PROVIDER_SITE_OTHER): Payer: Medicare Other | Admitting: Nurse Practitioner

## 2017-10-20 ENCOUNTER — Encounter: Payer: Self-pay | Admitting: Nurse Practitioner

## 2017-10-20 VITALS — BP 152/90 | HR 104 | Ht 70.0 in | Wt 207.1 lb

## 2017-10-20 DIAGNOSIS — B370 Candidal stomatitis: Secondary | ICD-10-CM

## 2017-10-20 DIAGNOSIS — K219 Gastro-esophageal reflux disease without esophagitis: Secondary | ICD-10-CM | POA: Diagnosis not present

## 2017-10-20 MED ORDER — OMEPRAZOLE 40 MG PO CPDR: 40.0000 mg | DELAYED_RELEASE_CAPSULE | ORAL | 2 refills | Status: DC

## 2017-10-20 NOTE — Patient Instructions (Signed)
If you are age 75 or older, your body mass index should be between 23-30. Your Body mass index is 29.72 kg/m. If this is out of the aforementioned range listed, please consider follow up with your Primary Care Provider.  If you are age 44 or younger, your body mass index should be between 19-25. Your Body mass index is 29.72 kg/m. If this is out of the aformentioned range listed, please consider follow up with your Primary Care Provider.   We have sent the following medications to your pharmacy for you to pick up at your convenience: Prilosec 40 mg every morning 30 - 60 minutes before breakfast.  Continue Zantac at bedtime.  Thank you for choosing me and [facility].   Tye Savoy, NP

## 2017-10-20 NOTE — Progress Notes (Addendum)
      IMPRESSION and PLAN:    #1.  Post-prandial chest and epigastric burning, improved after treatment for oral candidiasis and recent addition of bedtime Zantac to am Prilosec. Still some mild post-prandial burning.  -We talked about increasing omeprazole from 20 to 40 mg in the morning with continuation of bedtime Zantac.  Patient is interested in trying this. -If burning does not continue to improve then will need to talk about an upper endoscopy.  Patient will let us know     #2.  [facility] colon cancer in father around age 61. Patient already scheduled for surveillance colonoscopy on 10/27/17.      Agree with Ms. Guenther's assessment and plan. Gatha Mayer, MD, [facility]    HPI:    Chief Complaint: follow up of thrush   Patient is a 77 year old male who I saw late May for evaluation of post-prandial chest /  epigastric burning and nausea.  He had been diagnosed with oral candidiasis by another provider but did not complete prescribed Diflucan, I think due to cost.  I prescribed Mycelex Troches which he did get and complete 10 days of.  Patient is here for follow-up.  He has no odynophagia, no dysphagia.  Overall the post-prandial burning in chest and epigastrium has improved. He still get some very mild burning after meals.    Review of systems:     No chest pain, no SOB, no fevers, no urinary sx   Past Medical History:  Diagnosis Date  . Arthritis    shoulders (08/23/2017)  . Bleeding stomach ulcer 1988  . Complication of anesthesia 1993   " dont' know the name of it but they gave it to me at [facility] in 1993 "   I was cold, shaking and had a headache for hours afterwards "  . GERD (gastroesophageal reflux disease)   . Heart murmur    "when I was a child"  . History of blood transfusion 1988   "bleeding ulcer"    . History of TIA (transient ischemic attack) 07/2015   pt denies this hx on 08/23/2017  . Hypertension   . Migraine 1959-1980   "did have 4 ~ 2 wks ago; just  the aura then" (08/23/2017)  . Peptic ulcer disease     Patient's surgical history, family medical history, social history, medications and allergies were all reviewed in Epic   Creatinine clearance cannot be calculated (Patient's most recent lab result is older than the maximum 21 days allowed.)   Physical Exam:     BP (!) 152/90 (BP Location: Left Arm, Patient Position: Sitting, Cuff Size: Normal)   Pulse (!) 104   Ht 5\' 10"  (1.778 m) Comment: height measured without shoes  Wt 207 lb 2 oz (94 kg)   BMI 29.72 kg/m   GENERAL:  Pleasant male in NAD PSYCH: : Cooperative, normal affect EENT:  conjunctiva pink, mucous membranes moist, white bumps / exudate in back of throat seen last visit have resolved. Neck supple without masses PULM: Normal respiratory effort ABDOMEN:  Nondistended, soft, nontender. No obvious masses,  normal bowel sounds  NEURO: Alert and oriented x 3, no focal neurologic deficits   Tye Savoy , NP 10/20/2017, 2:55 PM

## 2017-10-27 ENCOUNTER — Ambulatory Visit (AMBULATORY_SURGERY_CENTER): Payer: Medicare Other | Admitting: Internal Medicine

## 2017-10-27 ENCOUNTER — Telehealth: Payer: Self-pay | Admitting: Internal Medicine

## 2017-10-27 ENCOUNTER — Encounter: Payer: Self-pay | Admitting: Internal Medicine

## 2017-10-27 VITALS — BP 131/82 | HR 62 | Temp 99.8°F | Resp 29 | Ht 70.0 in | Wt 207.0 lb

## 2017-10-27 DIAGNOSIS — Z800 Family history of malignant neoplasm of digestive organs: Secondary | ICD-10-CM | POA: Diagnosis not present

## 2017-10-27 DIAGNOSIS — D123 Benign neoplasm of transverse colon: Secondary | ICD-10-CM

## 2017-10-27 DIAGNOSIS — I10 Essential (primary) hypertension: Secondary | ICD-10-CM | POA: Diagnosis not present

## 2017-10-27 DIAGNOSIS — D122 Benign neoplasm of ascending colon: Secondary | ICD-10-CM

## 2017-10-27 DIAGNOSIS — Z1211 Encounter for screening for malignant neoplasm of colon: Secondary | ICD-10-CM

## 2017-10-27 DIAGNOSIS — K219 Gastro-esophageal reflux disease without esophagitis: Secondary | ICD-10-CM | POA: Diagnosis not present

## 2017-10-27 DIAGNOSIS — Z8673 Personal history of transient ischemic attack (TIA), and cerebral infarction without residual deficits: Secondary | ICD-10-CM | POA: Diagnosis not present

## 2017-10-27 DIAGNOSIS — K635 Polyp of colon: Secondary | ICD-10-CM | POA: Diagnosis not present

## 2017-10-27 MED ORDER — FLEET ENEMA 7-19 GM/118ML RE ENEM
1.0000 | ENEMA | Freq: Once | RECTAL | Status: AC
  Administered 2017-10-27: 1 via RECTAL

## 2017-10-27 MED ORDER — SODIUM CHLORIDE 0.9 % IV SOLN: 500.0000 mL | Freq: Once | INTRAVENOUS | Status: DC

## 2017-10-27 NOTE — Progress Notes (Signed)
Called to room to assist during endoscopic procedure.  Patient ID and intended procedure confirmed with present staff. Received instructions for my participation in the procedure from the performing physician.  

## 2017-10-27 NOTE — Op Note (Signed)
[facility] Patient Name: Steven Simmons Procedure Date: 10/27/2017 3:01 PM MRN: [id number] Endoscopist: Gatha Mayer , MD Age: 77 Referring MD:  Date of Birth: 12/23/1940 Gender: Male Account #: [id number] Procedure:                Colonoscopy Indications:              Screening in patient at increased risk: Colorectal                            cancer in father before age 28 Medicines:                Propofol per Anesthesia, Monitored Anesthesia Care Procedure:                Pre-Anesthesia Assessment:                           - Prior to the procedure, a History and Physical                            was performed, and patient medications and                            allergies were reviewed. The patient's tolerance of                            previous anesthesia was also reviewed. The risks                            and benefits of the procedure and the sedation                            options and risks were discussed with the patient.                            All questions were answered, and informed consent                            was obtained. Prior Anticoagulants: The patient has                            taken no previous anticoagulant or antiplatelet                            agents. ASA Grade Assessment: II - A patient with                            mild systemic disease. After reviewing the risks                            and benefits, the patient was deemed in                            satisfactory condition to undergo the procedure.  After obtaining informed consent, the colonoscope                            was passed under direct vision. Throughout the                            procedure, the patient's blood pressure, pulse, and                            oxygen saturations were monitored continuously. The                            Colonoscope was introduced through the anus and   advanced to the the cecum, identified by                            appendiceal orifice and ileocecal valve. The                            colonoscopy was performed without difficulty. The                            patient tolerated the procedure well. The quality                            of the bowel preparation was good. The ileocecal                            valve, appendiceal orifice, and rectum were                            photographed. The bowel preparation used was                            Miralax. Scope In: 3:15:22 PM Scope Out: 3:30:27 PM Scope Withdrawal Time: 0 hours 9 minutes 7 seconds  Total Procedure Duration: 0 hours 15 minutes 5 seconds  Findings:                 The perianal and digital rectal examinations were                            normal. Pertinent negatives include normal prostate                            (size, shape, and consistency).                           Two flat and sessile polyps were found in the                            transverse colon and ascending colon. The polyps                            were 4 to 8 mm in size. These polyps  were removed                            with a cold snare. Resection and retrieval were                            complete. Verification of patient identification                            for the specimen was done. Estimated blood loss was                            minimal.                           Many diverticula were found in the left colon and                            right colon.                           The exam was otherwise without abnormality on                            direct and retroflexion views. Complications:            No immediate complications. Estimated Blood Loss:     Estimated blood loss was minimal. Impression:               - Two 4 to 8 mm polyps in the transverse colon and                            in the ascending colon, removed with a cold snare.                             Resected and retrieved.                           - Severe diverticulosis in the left colon and in                            the right colon.                           - The examination was otherwise normal on direct                            and retroflexion views. Recommendation:           - Patient has a contact number available for                            emergencies. The signs and symptoms of potential                            delayed complications were discussed with the  patient. Return to normal activities tomorrow.                            Written discharge instructions were provided to the                            patient.                           - Resume previous diet.                           - Continue present medications.                           - Repeat colonoscopy is recommended. The                            colonoscopy date will be determined after pathology                            results from today's exam become available for                            review. Gatha Mayer, MD 10/27/2017 3:39:15 PM This report has been signed electronically.

## 2017-10-27 NOTE — Telephone Encounter (Signed)
Pt drank his prep last night and had light brown liquid results.  He drank the 2nd dose this morning and has had no results.  I instructed him to drink more fluids up until 1:00.  I informed him that he could possibly still move his bowels up until his procedure time.  Pt will drink liquids and call back as needed before his arrival time.

## 2017-10-27 NOTE — Progress Notes (Signed)
Report to PACU, RN, vss, BBS= Clear.  

## 2017-10-27 NOTE — Patient Instructions (Addendum)
I found and removed 2 polyps that look benign. You also have a condition called diverticulosis - common and not usually a problem. Please read the handout provided.  I will let you know pathology results and when/if to have another routine colonoscopy by mail and/or My Chart.  I appreciate the opportunity to care for you. Gatha Mayer, MD, [facility]  HANDOUTS GIVEN FOR POLYPS AND DIVERTICULOSIS  YOU HAD AN ENDOSCOPIC PROCEDURE TODAY AT [facility]:   Refer to the procedure report that was given to you for any specific questions about what was found during the examination.  If the procedure report does not answer your questions, please call your gastroenterologist to clarify.  If you requested that your care partner not be given the details of your procedure findings, then the procedure report has been included in a sealed envelope for you to review at your convenience later.  YOU SHOULD EXPECT: Some feelings of bloating in the abdomen. Passage of more gas than usual.  Walking can help get rid of the air that was put into your GI tract during the procedure and reduce the bloating. If you had a lower endoscopy (such as a colonoscopy or flexible sigmoidoscopy) you may notice spotting of blood in your stool or on the toilet paper. If you underwent a bowel prep for your procedure, you may not have a normal bowel movement for a few days.  Please Note:  You might notice some irritation and congestion in your nose or some drainage.  This is from the oxygen used during your procedure.  There is no need for concern and it should clear up in a day or so.  SYMPTOMS TO REPORT IMMEDIATELY:   Following lower endoscopy (colonoscopy or flexible sigmoidoscopy):  Excessive amounts of blood in the stool  Significant tenderness or worsening of abdominal pains  Swelling of the abdomen that is new, acute  Fever of 100F or higher  For urgent or emergent issues, a gastroenterologist can be  reached at any hour by calling [phone].   DIET:  We do recommend a small meal at first, but then you may proceed to your regular diet.  Drink plenty of fluids but you should avoid alcoholic beverages for 24 hours.  ACTIVITY:  You should plan to take it easy for the rest of today and you should NOT DRIVE or use heavy machinery until tomorrow (because of the sedation medicines used during the test).    FOLLOW UP: Our staff will call the number listed on your records the next business day following your procedure to check on you and address any questions or concerns that you may have regarding the information given to you following your procedure. If we do not reach you, we will leave a message.  However, if you are feeling well and you are not experiencing any problems, there is no need to return our call.  We will assume that you have returned to your regular daily activities without incident.  If any biopsies were taken you will be contacted by phone or by letter within the next 1-3 weeks.  Please call us at [phone] if you have not heard about the biopsies in 3 weeks.    SIGNATURES/CONFIDENTIALITY: You and/or your care partner have signed paperwork which will be entered into your electronic medical record.  These signatures attest to the fact that that the information above on your After Visit Summary has been reviewed and is understood.  Full  responsibility of the confidentiality of this discharge information lies with you and/or your care-partner.

## 2017-10-28 ENCOUNTER — Other Ambulatory Visit: Payer: Self-pay | Admitting: Family Medicine

## 2017-10-28 DIAGNOSIS — I10 Essential (primary) hypertension: Secondary | ICD-10-CM

## 2017-11-01 ENCOUNTER — Telehealth: Payer: Self-pay

## 2017-11-01 NOTE — Telephone Encounter (Signed)
  Follow up Call-  Call back number 10/27/2017  Post procedure Call Back phone  # [phone]  Permission to leave phone message Yes  Some recent data might be hidden     Patient questions:  Do you have a fever, pain , or abdominal swelling? No. Pain Score  0 *  Have you tolerated food without any problems? Yes.    Have you been able to return to your normal activities? Yes.    Do you have any questions about your discharge instructions: Diet   No. Medications  No. Follow up visit  No.  Do you have questions or concerns about your Care? No.  Actions: * If pain score is 4 or above: No action needed, pain <4.

## 2017-11-01 NOTE — Telephone Encounter (Signed)
No answer, left message to call back later today, B.Sahana Boyland RN. 

## 2017-11-02 ENCOUNTER — Encounter: Payer: Self-pay | Admitting: Internal Medicine

## 2017-11-02 NOTE — Progress Notes (Signed)
2 ssp's < 1 cm No recall (age) My Chart

## 2017-11-11 NOTE — Telephone Encounter (Signed)
finish

## 2018-03-14 DIAGNOSIS — Z23 Encounter for immunization: Secondary | ICD-10-CM | POA: Diagnosis not present

## 2018-03-27 ENCOUNTER — Other Ambulatory Visit: Payer: Self-pay | Admitting: Student in an Organized Health Care Education/Training Program

## 2018-03-27 DIAGNOSIS — I10 Essential (primary) hypertension: Secondary | ICD-10-CM

## 2018-03-27 NOTE — Telephone Encounter (Signed)
Needs HTN follow up. Can patient please be called to schedule

## 2018-05-04 ENCOUNTER — Other Ambulatory Visit: Payer: Self-pay | Admitting: Family Medicine

## 2018-05-04 DIAGNOSIS — I10 Essential (primary) hypertension: Secondary | ICD-10-CM

## 2018-05-04 NOTE — Telephone Encounter (Signed)
Needs HTN follow up with bloodwork. Will give 30d refill.

## 2018-06-14 ENCOUNTER — Other Ambulatory Visit: Payer: Self-pay | Admitting: Family Medicine

## 2018-06-14 DIAGNOSIS — I10 Essential (primary) hypertension: Secondary | ICD-10-CM

## 2018-06-14 MED ORDER — LISINOPRIL-HYDROCHLOROTHIAZIDE 20-12.5 MG PO TABS: 2.0000 | ORAL_TABLET | Freq: Every day | ORAL | 0 refills | Status: DC

## 2018-06-14 NOTE — Telephone Encounter (Signed)
Pt scheduled an appointment with Dr. Shawna Orleans for his medication refills but is out of his lisinopril. Pt would like to have enough sent to his pharmacy to last him until his appointment on 06/27/18.

## 2018-06-27 ENCOUNTER — Ambulatory Visit: Payer: Medicare Other | Admitting: Family Medicine

## 2018-07-26 ENCOUNTER — Other Ambulatory Visit: Payer: Self-pay | Admitting: Family Medicine

## 2018-07-26 DIAGNOSIS — I10 Essential (primary) hypertension: Secondary | ICD-10-CM

## 2018-07-26 NOTE — Telephone Encounter (Signed)
30d refill given as patient needs BMP when need for social distancing is over

## 2018-07-31 ENCOUNTER — Other Ambulatory Visit: Payer: Self-pay | Admitting: Family Medicine

## 2018-07-31 DIAGNOSIS — I10 Essential (primary) hypertension: Secondary | ICD-10-CM

## 2018-07-31 MED ORDER — LISINOPRIL-HYDROCHLOROTHIAZIDE 20-12.5 MG PO TABS: 2.0000 | ORAL_TABLET | Freq: Every day | ORAL | 0 refills | Status: DC

## 2018-11-01 ENCOUNTER — Other Ambulatory Visit: Payer: Self-pay

## 2018-11-01 DIAGNOSIS — I10 Essential (primary) hypertension: Secondary | ICD-10-CM

## 2018-11-03 ENCOUNTER — Other Ambulatory Visit: Payer: Self-pay

## 2018-11-06 MED ORDER — LISINOPRIL-HYDROCHLOROTHIAZIDE 20-12.5 MG PO TABS: 2.0000 | ORAL_TABLET | Freq: Every day | ORAL | 0 refills | Status: DC

## 2018-11-06 NOTE — Telephone Encounter (Signed)
10 day supply given---the patient has been given multiple prior 30 day supplies, last K was 3.0. Dr. Shawna Orleans asked him to return to care to check K multiple times.   Please schedule follow up in next 10 days with any provider.   Dorris Singh, MD  Family Medicine Teaching Service

## 2018-11-13 ENCOUNTER — Encounter: Payer: Self-pay | Admitting: Family Medicine

## 2018-11-14 ENCOUNTER — Other Ambulatory Visit: Payer: Self-pay | Admitting: Family Medicine

## 2018-11-14 DIAGNOSIS — I10 Essential (primary) hypertension: Secondary | ICD-10-CM

## 2018-11-15 ENCOUNTER — Other Ambulatory Visit: Payer: Self-pay | Admitting: Family Medicine

## 2018-11-15 ENCOUNTER — Ambulatory Visit (INDEPENDENT_AMBULATORY_CARE_PROVIDER_SITE_OTHER): Payer: Medicare Other | Admitting: *Deleted

## 2018-11-15 ENCOUNTER — Other Ambulatory Visit: Payer: Self-pay

## 2018-11-15 DIAGNOSIS — I10 Essential (primary) hypertension: Secondary | ICD-10-CM

## 2018-11-15 NOTE — Progress Notes (Signed)
Pt walks in because he has not had BP meds in 2 days and thinks that his BP is high.   Upon review of his chart it seems that we sent in a 10 day supply to last till his appt tomorrow.  Pt states he did not know this was sent in.  I called pharmacy to verify they receive the med.  They did but placed it back on hold because pt never picked up.  They will refill now.  His BP today is 172/84 and he is asymptomatic.   Spoke with Dr. Erin Hearing.  Pt may go home but red flags discussed to go to the ED.  Pt will return tomorrow for appt and lab work. Christen Bame, CMA

## 2018-11-16 ENCOUNTER — Other Ambulatory Visit: Payer: Self-pay

## 2018-11-16 ENCOUNTER — Ambulatory Visit (INDEPENDENT_AMBULATORY_CARE_PROVIDER_SITE_OTHER): Payer: Medicare Other | Admitting: Family Medicine

## 2018-11-16 ENCOUNTER — Encounter: Payer: Self-pay | Admitting: Family Medicine

## 2018-11-16 VITALS — BP 130/58 | HR 75 | Ht 70.0 in | Wt 200.1 lb

## 2018-11-16 DIAGNOSIS — R7303 Prediabetes: Secondary | ICD-10-CM

## 2018-11-16 DIAGNOSIS — E785 Hyperlipidemia, unspecified: Secondary | ICD-10-CM

## 2018-11-16 DIAGNOSIS — I10 Essential (primary) hypertension: Secondary | ICD-10-CM

## 2018-11-16 DIAGNOSIS — Z20822 Contact with and (suspected) exposure to covid-19: Secondary | ICD-10-CM

## 2018-11-16 DIAGNOSIS — R6889 Other general symptoms and signs: Secondary | ICD-10-CM | POA: Diagnosis not present

## 2018-11-16 LAB — POCT GLYCOSYLATED HEMOGLOBIN (HGB A1C): HbA1c, POC (prediabetic range): 5.7 % (ref 5.7–6.4)

## 2018-11-16 MED ORDER — LISINOPRIL-HYDROCHLOROTHIAZIDE 20-12.5 MG PO TABS: 2.0000 | ORAL_TABLET | Freq: Every day | ORAL | 3 refills | Status: DC

## 2018-11-16 NOTE — Progress Notes (Signed)
   Subjective:    Steven Simmons - 78 y.o. male MRN [id number]  Date of birth: 02-07-1941  CC:  Steven Simmons is here for follow-up of hypertension and prediabetes.  He would also like to be tested for COVID.  HPI: Hypertension Patient says that he has been through many blood pressure medications in the past, and lisinopril-HCTZ was found to be the most effective for him.  He would like to continue with this medication.  He denies headaches and blurry vision.  Prediabetes Patient currently does not take any medication for this.  His previous A1c about 1 year ago was 6.0.  He denies polyuria, polydipsia, and polyphagia.  COVID test Patient denies fever, chills, cough, shortness of breath, and diarrhea.  He just wants to make sure he is not infected and asymptomatic so that he does not expose others.  He denies any known exposure to individuals with COVID  Health Maintenance:  Health Maintenance Due  Topic Date Due  . TETANUS/TDAP  04/07/1961  . INFLUENZA VACCINE  11/04/2018    -  reports that he quit smoking about 41 years ago. His smoking use included cigarettes and cigars. He quit after 30.00 years of use. He has never used smokeless tobacco. - Review of Systems: Per HPI. - Past Medical History: Patient Active Problem List   Diagnosis Date Noted  . Prediabetes 11/16/2018  . Appetite impaired 08/23/2017  . Singultus, recent history 08/23/2017  . Enlarged prostate on Pelvic CT 08/23/2017  . GERD (gastroesophageal reflux disease) 08/22/2017  . Cough 01/25/2014  . Erectile dysfunction 12/28/2013  . HLD (hyperlipidemia) 08/11/2012  . HTN (hypertension) 06/29/2012   - Medications: reviewed and updated   Objective:   Physical Exam BP (!) 130/58   Pulse 75   Ht 5\' 10"  (1.778 m)   Wt 200 lb 2 oz (90.8 kg)   SpO2 98%   BMI 28.71 kg/m  Gen: NAD, alert, cooperative with exam, well-appearing CV: RRR, good S1/S2, no murmur, no edema Resp: CTABL, no wheezes, non-labored      Assessment & Plan:   HTN (hypertension) Blood pressure is well controlled at 130/58 today.  I have sent in a 90-day supply with 3 refills of lisinopril-HCTZ so that he will have a years supply.  We will check a BMP today.  Prediabetes Stable.  Hemoglobin A1c is 5.7 today, which has actually improved since his last measurement about 1 year ago.  We do not need to pursue any treatment currently and will continue to monitor about every 6 months to 1 year.  HLD (hyperlipidemia) Will order a lipid panel today.  Sent patient to [facility] for drive-through testing for COVID 19.  Patient does not have any symptoms of COVID but is at higher risk of complications if he does become infected.  Maia Breslow, M.D. 11/16/2018, 1:20 PM PGY-3, [place]

## 2018-11-16 NOTE — Assessment & Plan Note (Signed)
Blood pressure is well controlled at 130/58 today.  I have sent in a 90-day supply with 3 refills of lisinopril-HCTZ so that he will have a years supply.  We will check a BMP today.

## 2018-11-16 NOTE — Telephone Encounter (Signed)
Pt in office for appointment today and informed CMA working with doctor that pt will need med refills per a message Dr. Posey Pronto sent. Steven Simmons, CMA

## 2018-11-16 NOTE — Assessment & Plan Note (Signed)
Stable.  Hemoglobin A1c is 5.7 today, which has actually improved since his last measurement about 1 year ago.  We do not need to pursue any treatment currently and will continue to monitor about every 6 months to 1 year.

## 2018-11-16 NOTE — Patient Instructions (Signed)
It was nice meeting you today Steven Simmons!  I have refilled your lisinopril-HCTZ and given you a 90-day supply with 3 refills, which should be enough for 1 year.  We are checking your electrolytes and your cholesterol today.  I will let you know if any of your results are abnormal.  We are able to order COVID testing for anyone now, so I have ordered that for you.  Please go to [facility] to do their drive-through testing.  If you have any questions or concerns, please feel free to call the clinic.   Be well,  Dr. Shan Levans

## 2018-11-16 NOTE — Assessment & Plan Note (Signed)
Will order a lipid panel today.

## 2018-11-16 NOTE — Telephone Encounter (Signed)
Hi Page Please could you kindly call this patient and confirm he has an appointment today. He can have meds refilled at the clinic.  Thank you! Annarae Macnair

## 2018-11-17 ENCOUNTER — Telehealth: Payer: Self-pay | Admitting: Family Medicine

## 2018-11-17 LAB — BASIC METABOLIC PANEL
BUN/Creatinine Ratio: 21 (ref 10–24)
BUN: 22 mg/dL (ref 8–27)
CO2: 24 mmol/L (ref 20–29)
Calcium: 9.9 mg/dL (ref 8.6–10.2)
Chloride: 96 mmol/L (ref 96–106)
Creatinine, Ser: 1.05 mg/dL (ref 0.76–1.27)
GFR calc Af Amer: 79 mL/min/{1.73_m2} (ref >59)
GFR calc non Af Amer: 69 mL/min/{1.73_m2} (ref >59)
Glucose: 97 mg/dL (ref 65–99)
Potassium: 3.7 mmol/L (ref 3.5–5.2)
Sodium: 138 mmol/L (ref 134–144)

## 2018-11-17 LAB — LIPID PANEL
Chol/HDL Ratio: 4.7 ratio (ref 0.0–5.0)
Cholesterol, Total: 199 mg/dL (ref 100–199)
HDL: 42 mg/dL (ref >39)
LDL Calculated: 139 mg/dL — ABNORMAL HIGH (ref 0–99)
Triglycerides: 91 mg/dL (ref 0–149)
VLDL Cholesterol Cal: 18 mg/dL (ref 5–40)

## 2018-11-17 NOTE — Telephone Encounter (Signed)
Left patient a voicemail explaining his lab results.  His 10-year ASCVD risk is about 33%, which I explained to him means that he has a 1 in 3 chance of having a heart attack or stroke in the next 10 years.  It also seems that he has a history of a TIA, which increases his risk of stroke in the future.  I highly recommended that he start Crestor to lower his cholesterol.  According to the ASCVD calculator this would lower his risk of heart attack or stroke in the next 10 years to 1 in 5.  I encouraged him to call us and let us know if you would like to start this medication.

## 2018-11-18 LAB — NOVEL CORONAVIRUS, NAA: SARS-CoV-2, NAA: NOT DETECTED

## 2018-11-18 LAB — SPECIMEN STATUS REPORT

## 2018-11-23 ENCOUNTER — Telehealth: Payer: Self-pay | Admitting: *Deleted

## 2018-11-23 NOTE — Telephone Encounter (Signed)
-----   Message from Lattie Haw, MD sent at 11/23/2018  9:10 AM EDT ----- Regarding: clinic appoinment Hi everyone, Please could you kindly book this patient in to see me on my next available clinic as new PCP and to discuss statin therapy. Thank you :) Appreciate it!

## 2018-11-23 NOTE — Telephone Encounter (Signed)
LM on generic voice mail asking for a return call for an appointment. If pt calls, please schedule him an appt with new pcp. Ottis Stain, CMA

## 2018-12-05 NOTE — Telephone Encounter (Signed)
LVM asking for a return call to schedule an appointment. Ottis Stain, CMA

## 2018-12-07 ENCOUNTER — Ambulatory Visit: Payer: Medicare Other | Admitting: Family Medicine

## 2018-12-08 ENCOUNTER — Emergency Department (HOSPITAL_COMMUNITY)
Admission: EM | Admit: 2018-12-08 | Discharge: 2018-12-09 | Disposition: A | Discharge status: Home / Self Care | Payer: Medicare Other | Attending: Emergency Medicine | Admitting: Emergency Medicine

## 2018-12-08 ENCOUNTER — Other Ambulatory Visit: Payer: Self-pay

## 2018-12-08 ENCOUNTER — Encounter (HOSPITAL_COMMUNITY): Payer: Self-pay | Admitting: Emergency Medicine

## 2018-12-08 DIAGNOSIS — I10 Essential (primary) hypertension: Secondary | ICD-10-CM

## 2018-12-08 DIAGNOSIS — Z87891 Personal history of nicotine dependence: Secondary | ICD-10-CM | POA: Insufficient documentation

## 2018-12-08 DIAGNOSIS — R42 Dizziness and giddiness: Secondary | ICD-10-CM | POA: Diagnosis not present

## 2018-12-08 DIAGNOSIS — Z79899 Other long term (current) drug therapy: Secondary | ICD-10-CM | POA: Diagnosis not present

## 2018-12-08 DIAGNOSIS — E871 Hypo-osmolality and hyponatremia: Secondary | ICD-10-CM | POA: Insufficient documentation

## 2018-12-08 DIAGNOSIS — R030 Elevated blood-pressure reading, without diagnosis of hypertension: Secondary | ICD-10-CM | POA: Insufficient documentation

## 2018-12-08 DIAGNOSIS — R531 Weakness: Secondary | ICD-10-CM | POA: Diagnosis not present

## 2018-12-08 LAB — URINALYSIS, ROUTINE W REFLEX MICROSCOPIC
Bacteria, UA: NONE SEEN
Bilirubin Urine: NEGATIVE
Glucose, UA: NEGATIVE mg/dL
Ketones, ur: NEGATIVE mg/dL
Nitrite: NEGATIVE
Protein, ur: NEGATIVE mg/dL
Specific Gravity, Urine: 1.011 (ref 1.005–1.030)
pH: 7 (ref 5.0–8.0)

## 2018-12-08 LAB — CBC
HCT: 40.8 % (ref 39.0–52.0)
Hemoglobin: 13.8 g/dL (ref 13.0–17.0)
MCH: 29.9 pg (ref 26.0–34.0)
MCHC: 33.8 g/dL (ref 30.0–36.0)
MCV: 88.3 fL (ref 80.0–100.0)
Platelets: 357 10*3/uL (ref 150–400)
RBC: 4.62 MIL/uL (ref 4.22–5.81)
RDW: 12.9 % (ref 11.5–15.5)
WBC: 7.8 10*3/uL (ref 4.0–10.5)
nRBC: 0 % (ref 0.0–0.2)

## 2018-12-08 LAB — BASIC METABOLIC PANEL
Anion gap: 10 (ref 5–15)
BUN: 19 mg/dL (ref 8–23)
CO2: 25 mmol/L (ref 22–32)
Calcium: 9 mg/dL (ref 8.9–10.3)
Chloride: 97 mmol/L — ABNORMAL LOW (ref 98–111)
Creatinine, Ser: 0.9 mg/dL (ref 0.61–1.24)
GFR calc Af Amer: >60 mL/min (ref >60)
GFR calc non Af Amer: >60 mL/min (ref >60)
Glucose, Bld: 124 mg/dL — ABNORMAL HIGH (ref 70–99)
Potassium: 3.9 mmol/L (ref 3.5–5.1)
Sodium: 132 mmol/L — ABNORMAL LOW (ref 135–145)

## 2018-12-08 MED ORDER — SODIUM CHLORIDE 0.9% FLUSH: 3.0000 mL | Freq: Once | INTRAVENOUS | Status: DC

## 2018-12-08 NOTE — ED Triage Notes (Signed)
Pt here w/co weakness today with sone tingling in fingers and toes. Pt states that he feels dizzy to the point where he shouldn't drive so he had someone bring him up here. Pt co some nausea. Pt states he occasionally has to come up here for this when his potassium is low.

## 2018-12-09 NOTE — Discharge Instructions (Addendum)
Please follow up with your primary care provider, consider getting a glucose tolerance test to see if you are getting hypoglycemic.  Try eating 5-6 small meals a day instead of three large ones.  Continue to monitor your blood pressure at home.

## 2018-12-09 NOTE — ED Provider Notes (Signed)
[facility] EMERGENCY DEPARTMENT Provider Note   CSN: [id number] Arrival date & time: 12/08/18  1807    History   Chief Complaint Chief Complaint  Patient presents with  . Dizziness  . Numbness  . Weakness    HPI Steven Simmons is a 78 y.o. male.   The history is provided by the patient.  Dizziness Associated symptoms: weakness   Weakness Associated symptoms: dizziness   He has history of hypertension, transient ischemic attack, peptic ulcer disease, hypokalemia and comes in because for the last several weeks, he has been having episodes where he feels a vague dizziness like when he is standing that his feet are not on the floor.  He does not feel like it is safe for him to walk.  He does have a rapid heartbeat at this time but he denies any sweating and he denies hurting anywhere.  He will then go and eat something, and symptoms will resolve within 15-30 minutes.  In the past, similar symptoms were related to low potassium so he came in today to see if his potassium was low.  He does take a diuretic for his blood pressure.  Past Medical History:  Diagnosis Date  . Allergy   . Arthritis    shoulders (08/23/2017)  . Aspiration pneumonia ([place]) 08/23/2017  . Bleeding stomach ulcer 1988  . Cataract   . Complication of anesthesia 1993   " dont' know the name of it but they gave it to me at [facility] in 1993 "   I was cold, shaking and had a headache for hours afterwards "  . GERD (gastroesophageal reflux disease)   . Heart murmur    "when I was a child"  . History of blood transfusion 1988   "bleeding ulcer"    . History of TIA (transient ischemic attack) 07/2015   pt denies this hx on 08/23/2017  . Hypertension   . Migraine 1959-1980   "did have 4 ~ 2 wks ago; just the aura then" (08/23/2017)  . Peptic ulcer disease   . Sepsis secondary to UTI ([place]) 08/22/2017    Patient Active Problem List   Diagnosis Date Noted  . Prediabetes 11/16/2018  . Appetite  impaired 08/23/2017  . Singultus, recent history 08/23/2017  . Enlarged prostate on Pelvic CT 08/23/2017  . GERD (gastroesophageal reflux disease) 08/22/2017  . Cough 01/25/2014  . Erectile dysfunction 12/28/2013  . HLD (hyperlipidemia) 08/11/2012  . HTN (hypertension) 06/29/2012    Past Surgical History:  Procedure Laterality Date  . CATARACT EXTRACTION W/ INTRAOCULAR LENS  IMPLANT, BILATERAL Bilateral   . COLONOSCOPY  2012 ?  Marland Kitchen EXCISIONAL HEMORRHOIDECTOMY  1993  . LAPAROSCOPIC CHOLECYSTECTOMY          Home Medications    Prior to Admission medications   Medication Sig Start Date End Date Taking? Authorizing Provider  acetaminophen (TYLENOL) 325 MG tablet Take 650 mg by mouth as needed.    [provider]  L-ARGININE PO Take 1 tablet by mouth daily.    [provider]  lisinopril-hydrochlorothiazide (ZESTORETIC) 20-12.5 MG tablet Take 2 tablets by mouth daily. 11/16/18   Kathrene Alu, MD  omeprazole (PRILOSEC) 40 MG capsule Take 1 capsule (40 mg total) by mouth every morning. Take  30-60 minutes before breakfast. 10/20/17   Willia Craze, NP  Potassium Gluconate 550 MG TABS Take 4 tablets by mouth 2 (two) times daily.    [provider]  ranitidine (ZANTAC) 150 MG tablet  Take 1 tablet (150 mg total) by mouth at bedtime. 09/02/17   Willia Craze, NP    Family History Family History  Problem Relation Age of Onset  . Heart disease Mother   . Hypertension Mother   . Heart disease Father   . Hypertension Father   . Colon cancer Father   . Esophageal cancer Neg Hx   . Rectal cancer Neg Hx   . Stomach cancer Neg Hx     Social History Social History   Tobacco Use  . Smoking status: Former Smoker    Years: 30.00    Types: Cigarettes, Cigars    Quit date: 04/20/1977    Years since quitting: 41.6  . Smokeless tobacco: Never Used  Substance Use Topics  . Alcohol use: No    Comment: 08/23/2017 "used to drink alot of beer; can't stand it  anymore; stopped in 1981"  . Drug use: No     Allergies   Ceclor [cefaclor]; Veralipride; Penicillins; Amlodipine; Antihistamines, chlorpheniramine-type; Beta adrenergic blockers; Celebrex [celecoxib]; Ibuprofen; Lipitor [atorvastatin]; and Nutrasweet aspartame [aspartame]   Review of Systems Review of Systems  Neurological: Positive for dizziness and weakness.  All other systems reviewed and are negative.    Physical Exam Updated Vital Signs BP (!) 176/97 (BP Location: Right Arm)   Pulse 80   Temp 98.9 F (37.2 C)   Resp 17   SpO2 96%   Physical Exam Vitals signs and nursing note reviewed.    78 year old male, resting comfortably and in no acute distress. Vital signs are significant for elevated blood pressure. Oxygen saturation is 96%, which is normal. Head is normocephalic and atraumatic. PERRLA, EOMI. Oropharynx is clear.  There is no nystagmus. Neck is nontender and supple without adenopathy or JVD.  There are no carotid bruits. Back is nontender and there is no CVA tenderness. Lungs are clear without rales, wheezes, or rhonchi. Chest is nontender. Heart has regular rate and rhythm without murmur. Abdomen is soft, flat, nontender without masses or hepatosplenomegaly and peristalsis is normoactive. Extremities have no cyanosis or edema, full range of motion is present. Skin is warm and dry without rash. Neurologic: Mental status is normal, cranial nerves are intact, there are no motor or sensory deficits.  Dizziness is not reproduced by passive head movement.  ED Treatments / Results  Labs (all labs ordered are listed, but only abnormal results are displayed) Labs Reviewed  BASIC METABOLIC PANEL - Abnormal; Notable for the following components:      Result Value   Sodium 132 (*)    Chloride 97 (*)    Glucose, Bld 124 (*)    All other components within normal limits  URINALYSIS, ROUTINE W REFLEX MICROSCOPIC - Abnormal; Notable for the following components:    Hgb urine dipstick SMALL (*)    Leukocytes,Ua TRACE (*)    All other components within normal limits  CBC  CBG MONITORING, ED    EKG EKG Interpretation  Date/Time:  Friday December 08 2018 19:09:32 EDT Ventricular Rate:  80 PR Interval:  144 QRS Duration: 80 QT Interval:  354 QTC Calculation: 408 R Axis:   21 Text Interpretation:  Normal sinus rhythm Septal infarct , age undetermined Abnormal ECG When compared with ECG of 08/22/2017, HEART RATE has decreased Premature atrial complexes are no longer present Nonspecific ST abnormality is no longer present Confirmed by Delora Fuel ([id number]) on 12/08/2018 11:19:52 PM  Procedures Procedures   Medications Ordered in ED Medications  sodium chloride  flush (NS) 0.9 % injection 3 mL (has no administration in time range)     Initial Impression / Assessment and Plan / ED Course  I have reviewed the triage vital signs and the nursing notes.  Pertinent lab results that were available during my care of the patient were reviewed by me and considered in my medical decision making (see chart for details).  Vague symptoms which seemed to respond to eating.  Old records are reviewed, and he has had multiple times with potassium low.  However, today potassium is 3.9.  Labs do show mild hyponatremia which is not felt to be clinically significant.  From his description, I am suspicious that he is actually having hypoglycemic episodes.  It might be worthwhile to arrange a 6-hour glucose tolerance test as an outpatient to evaluate this.  In the meantime, I have recommended that he start eating 5-6 small meals a day instead of 3 large meals.  He is to follow-up with his PCP to consider whether to proceed with a glucose tolerance test.  Final Clinical Impressions(s) / ED Diagnoses   Final diagnoses:  Dizziness  Elevated blood pressure reading with diagnosis of hypertension  Hyponatremia    ED Discharge Orders    None       Delora Fuel, MD  [id number] [phone]

## 2018-12-18 ENCOUNTER — Telehealth: Payer: Self-pay | Admitting: *Deleted

## 2018-12-18 NOTE — Telephone Encounter (Signed)
LVM to call office to schedule ED follow up per Dr. Posey Pronto. Will also send MyChart message  .April Zimmerman Rumple, CMA

## 2018-12-18 NOTE — Telephone Encounter (Signed)
-----   Message from Lattie Haw, MD sent at 12/17/2018 10:51 PM EDT ----- Regarding: clinic visit [facility]!  Please could you kindly schedule this patient to have a follow up clinic visit with me/another provider for possible low blood sugars as he had a recent ER visit for this.  Thank you, appreciate it.  [place]

## 2019-01-22 DIAGNOSIS — Z23 Encounter for immunization: Secondary | ICD-10-CM | POA: Diagnosis not present

## 2019-10-07 IMAGING — CT CT ABD-PELV W/ CM
2 of 6 series · 16 of 46 positions shown, 18 images · IV contrast (APPLIED)
Comparison: 10/31/2009

CLINICAL DATA: Nausea. Abdominal pain. Acid reflux. Abdominal
distension.

EXAM:
CT ABDOMEN AND PELVIS WITH CONTRAST
TECHNIQUE: Multidetector CT imaging of the abdomen and pelvis was performed
using the standard protocol following bolus administration of
intravenous contrast.
CONTRAST:  100mL OMNIPAQUE IOHEXOL 300 MG/ML  SOLN

[Series 3: abd/ pelvis 5.0 i30f 2 · axial · 0.92mm/px · z∈[+985,+1435]mm · 13 of 102 slices shown, 15 images]
[im 6/102  soft-tissue]
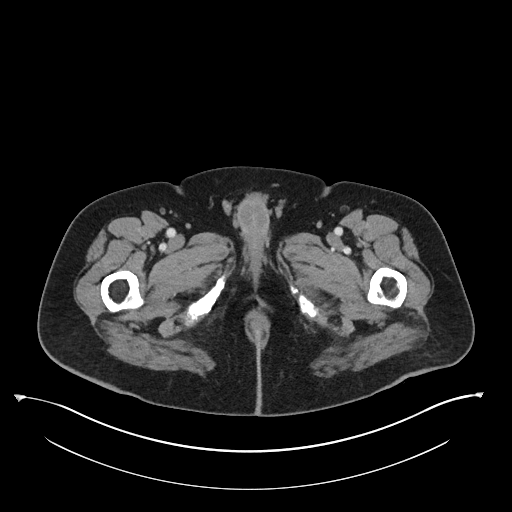
[im 6/102  bone]
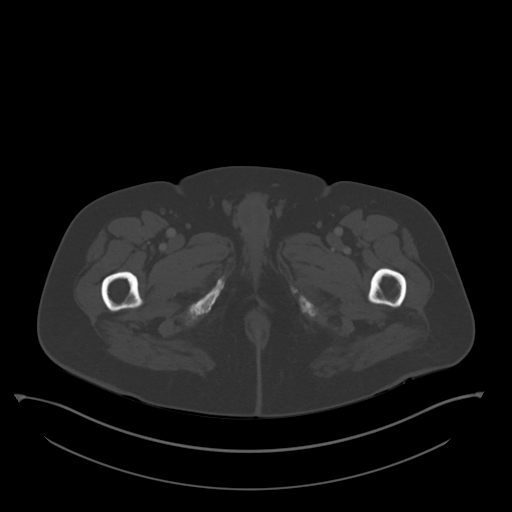
[im 16/102  soft-tissue]
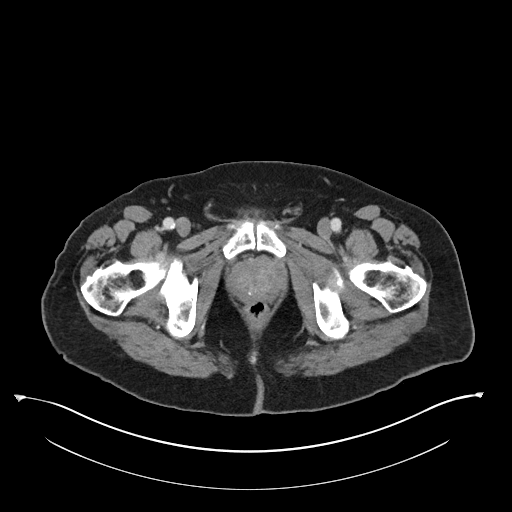
[im 22/102  soft-tissue]
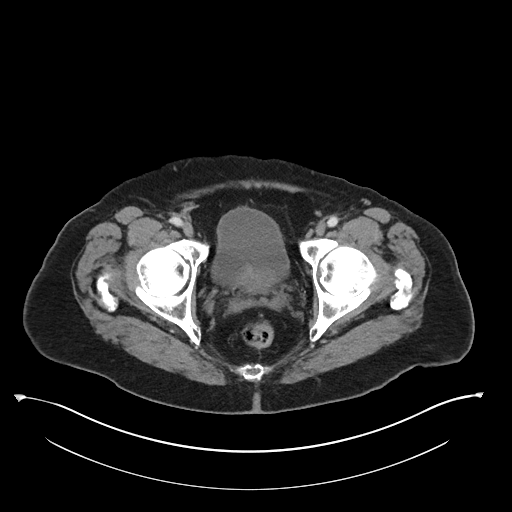
[im 27/102  soft-tissue]
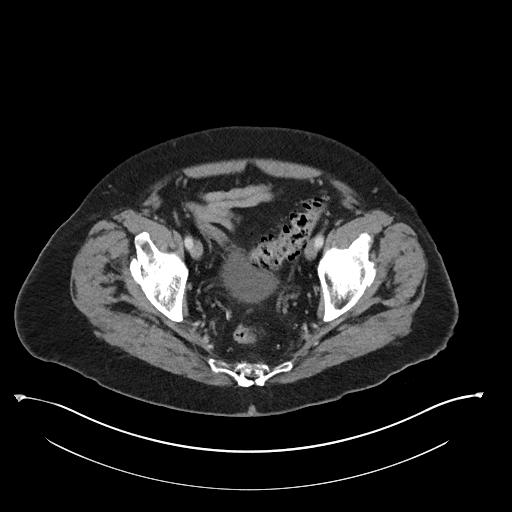
[im 38/102  soft-tissue]
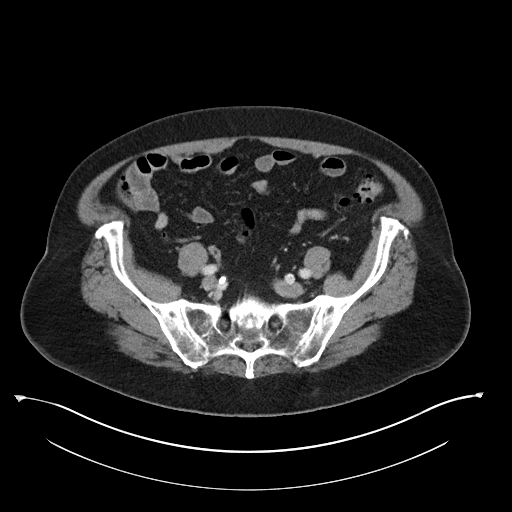
[im 43/102  soft-tissue]
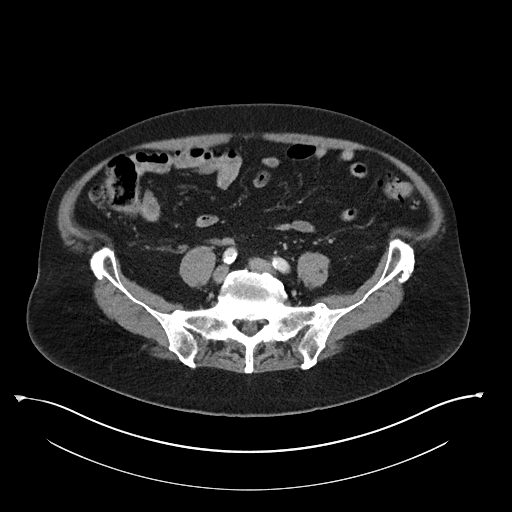
[im 54/102  soft-tissue]
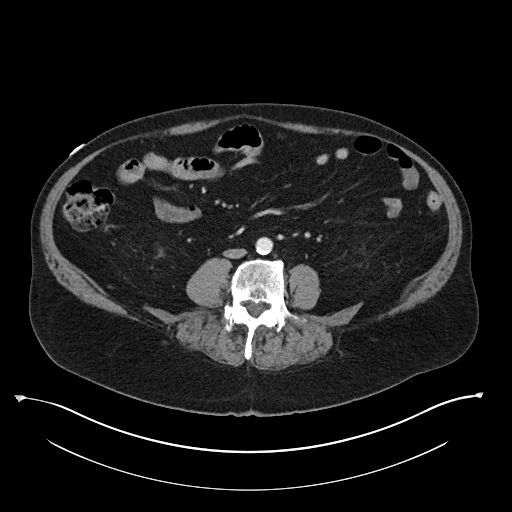
[im 59/102  soft-tissue]
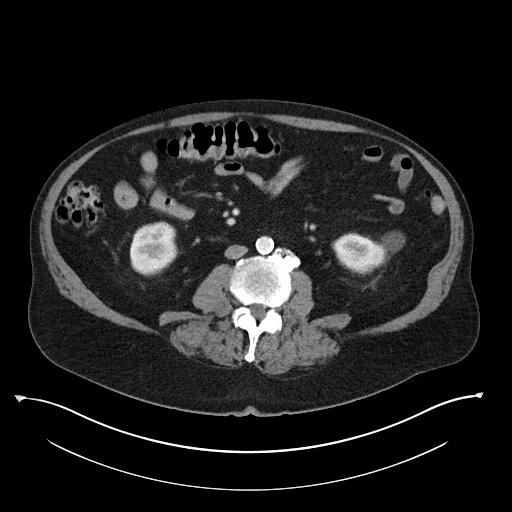
[im 64/102  soft-tissue]
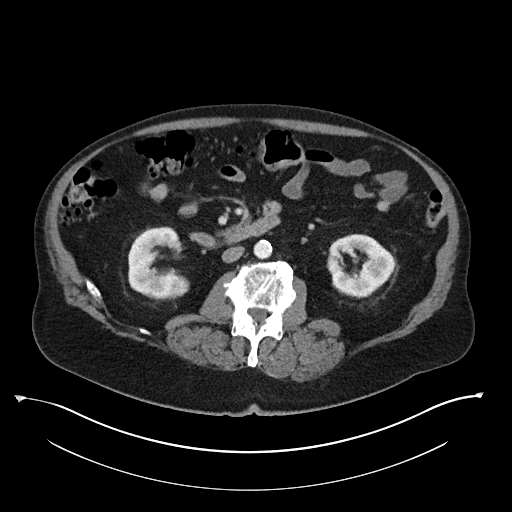
[im 64/102  bone]
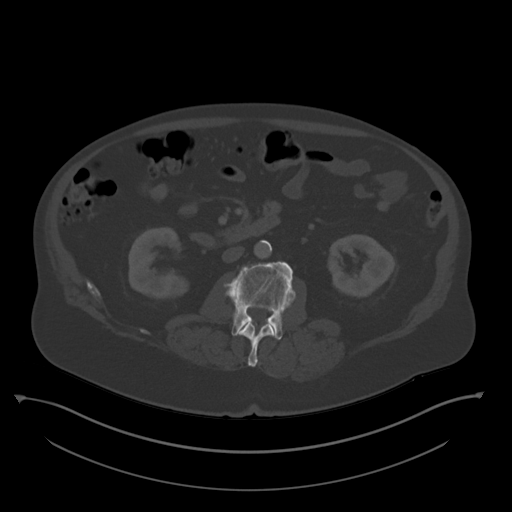
[im 75/102  soft-tissue]
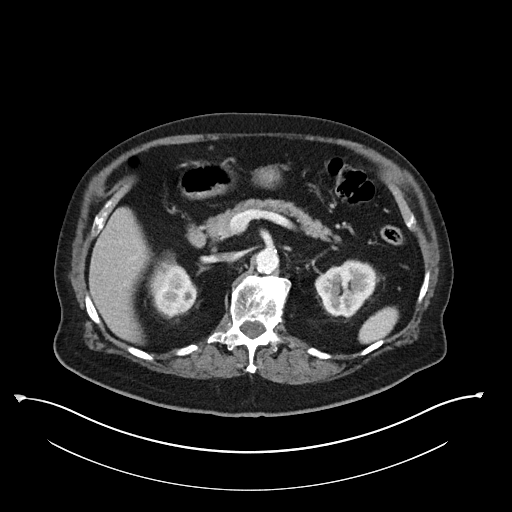
[im 80/102  soft-tissue]
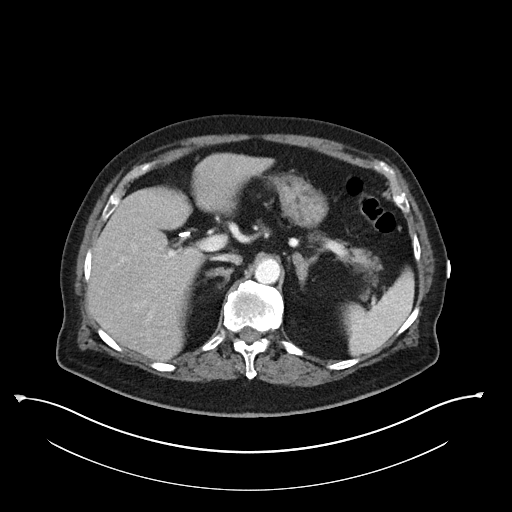
[im 86/102  soft-tissue]
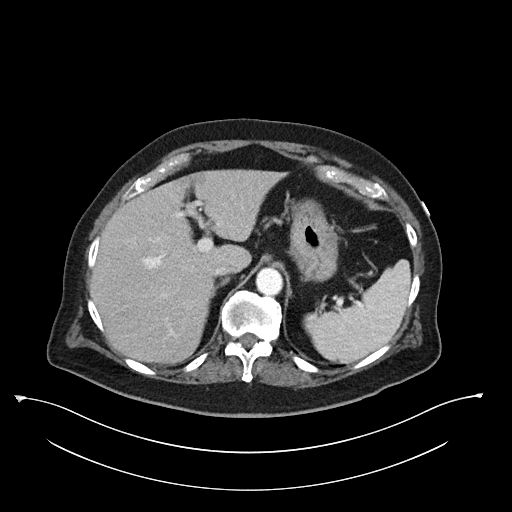
[im 96/102  soft-tissue]
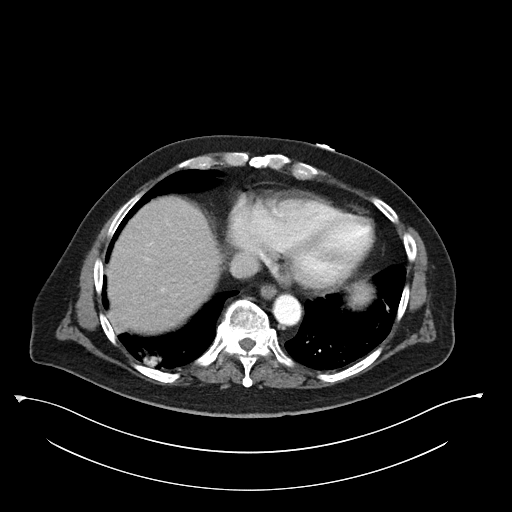

[Series 6: coronal soft tissue · coronal · 0.88mm/px · 3 of 113 slices shown]
[im 38/113  soft-tissue]
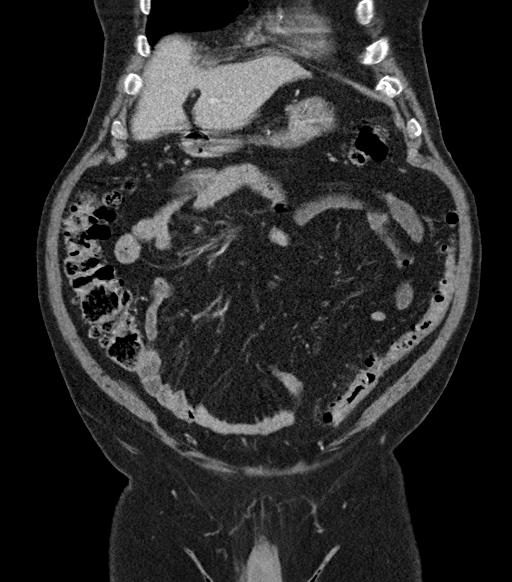
[im 50/113  soft-tissue]
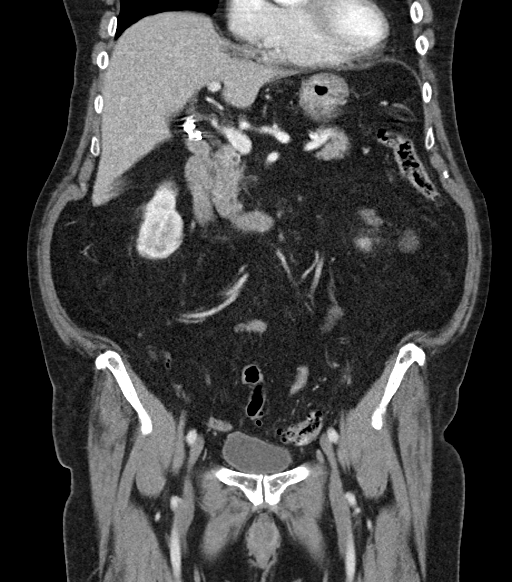
[im 63/113  soft-tissue]
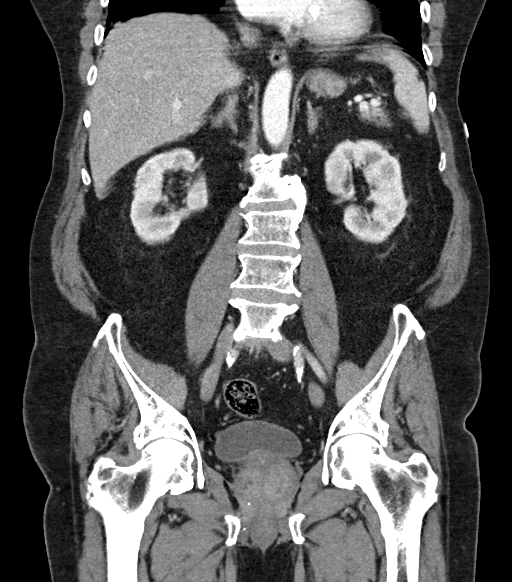

[16 of 46 positions shown; findings below may reference images not displayed]

FINDINGS: Lower chest: Patchy right lower lobe airspace disease with minimal
probable atelectasis at the left lung base laterally. Mild
cardiomegaly, without pericardial or pleural effusion.

Hepatobiliary: Mild motion degradation throughout. Normal liver.
Cholecystectomy, without biliary ductal dilatation.

Pancreas: Normal, without mass or ductal dilatation.

Spleen: Normal in size, without focal abnormality.

Adrenals/Urinary Tract: Mild left adrenal thickening. Normal right
adrenal gland. Lower pole left renal cyst. Bilateral too small to
characterize renal lesions. Normal urinary bladder.

Stomach/Bowel: The proximal stomach is underdistended. Apparent wall
thickening, including on image [DATE], is at least partially felt to
be secondary.

Scattered colonic diverticula. Normal terminal ileum and appendix.
Normal small bowel.

Vascular/Lymphatic: Aortic and branch vessel atherosclerosis. No
abdominopelvic adenopathy.

Reproductive: Mild prostatomegaly.

Other: No significant free fluid.  No free intraperitoneal air.

Musculoskeletal: Degenerative partial fusion of the bilateral
sacroiliac joints. Lumbosacral spondylosis.
IMPRESSION: 1. Mild motion degradation.
2.  No acute process in the abdomen or pelvis.
3. Apparent proximal gastric wall thickening is favored to be due to
underdistention. Gastritis cannot be excluded.
4.  Aortic Atherosclerosis (PNFC5-MTJ.J).
5. Prostatomegaly.
6. Peripheral right base airspace disease is indeterminate but new
since 1344. Considerations include infection/aspiration or interval
scarring. Correlate with acute pulmonary symptoms.

## 2019-11-01 ENCOUNTER — Telehealth: Payer: Self-pay | Admitting: *Deleted

## 2019-11-01 NOTE — Telephone Encounter (Signed)
LVM for pt to call office.  Called to confirm that pt wants his lisinopril-hydrochlorothiazide to be sent to the [facility].Raybon Conard Zimmerman Rumple, CMA

## 2019-11-05 ENCOUNTER — Other Ambulatory Visit: Payer: Self-pay | Admitting: *Deleted

## 2019-11-05 DIAGNOSIS — I10 Essential (primary) hypertension: Secondary | ICD-10-CM

## 2019-11-05 MED ORDER — LISINOPRIL-HYDROCHLOROTHIAZIDE 20-12.5 MG PO TABS: 2.0000 | ORAL_TABLET | Freq: Every day | ORAL | 3 refills | Status: DC

## 2019-11-05 NOTE — Telephone Encounter (Signed)
Was waiting to confirm pharmacy change and never heard from pt so went on and started the refill since [place] had called here.Steven Simmons, CMA

## 2019-11-05 NOTE — Telephone Encounter (Signed)
[place] leaves VM on nurse line requesting lisinopril-HCTZ. Please advise.

## 2019-11-06 NOTE — Telephone Encounter (Signed)
I believe I approved this request yesterday and sent the meds to the [facility]. Please let me know if not thank you.

## 2020-10-14 ENCOUNTER — Other Ambulatory Visit: Payer: Self-pay | Admitting: Family Medicine

## 2020-10-14 DIAGNOSIS — I10 Essential (primary) hypertension: Secondary | ICD-10-CM

## 2021-05-25 ENCOUNTER — Telehealth: Payer: Self-pay | Admitting: Family Medicine

## 2021-05-25 NOTE — Telephone Encounter (Signed)
Patient was called to schedule AWV. Please assist in scheduling if patient calls back.   Thanks!

## 2021-09-08 ENCOUNTER — Encounter: Payer: Self-pay | Admitting: *Deleted

## 2021-10-01 ENCOUNTER — Observation Stay (HOSPITAL_COMMUNITY)
Admission: EM | Admit: 2021-10-01 | Discharge: 2021-10-02 | Disposition: A | Discharge status: Home / Self Care | Payer: Medicare Other | Attending: Family Medicine | Admitting: Family Medicine

## 2021-10-01 ENCOUNTER — Encounter (HOSPITAL_COMMUNITY): Payer: Self-pay

## 2021-10-01 ENCOUNTER — Other Ambulatory Visit: Payer: Self-pay

## 2021-10-01 ENCOUNTER — Emergency Department (HOSPITAL_COMMUNITY): Payer: Medicare Other

## 2021-10-01 DIAGNOSIS — R231 Pallor: Secondary | ICD-10-CM | POA: Diagnosis not present

## 2021-10-01 DIAGNOSIS — R7303 Prediabetes: Secondary | ICD-10-CM | POA: Insufficient documentation

## 2021-10-01 DIAGNOSIS — I10 Essential (primary) hypertension: Secondary | ICD-10-CM | POA: Diagnosis present

## 2021-10-01 DIAGNOSIS — N400 Enlarged prostate without lower urinary tract symptoms: Secondary | ICD-10-CM | POA: Diagnosis not present

## 2021-10-01 DIAGNOSIS — I4891 Unspecified atrial fibrillation: Secondary | ICD-10-CM | POA: Diagnosis not present

## 2021-10-01 DIAGNOSIS — Z9049 Acquired absence of other specified parts of digestive tract: Secondary | ICD-10-CM | POA: Diagnosis not present

## 2021-10-01 DIAGNOSIS — D62 Acute posthemorrhagic anemia: Secondary | ICD-10-CM | POA: Diagnosis not present

## 2021-10-01 DIAGNOSIS — Z8673 Personal history of transient ischemic attack (TIA), and cerebral infarction without residual deficits: Secondary | ICD-10-CM | POA: Diagnosis not present

## 2021-10-01 DIAGNOSIS — K625 Hemorrhage of anus and rectum: Secondary | ICD-10-CM | POA: Diagnosis not present

## 2021-10-01 DIAGNOSIS — Z79899 Other long term (current) drug therapy: Secondary | ICD-10-CM | POA: Diagnosis not present

## 2021-10-01 DIAGNOSIS — K5730 Diverticulosis of large intestine without perforation or abscess without bleeding: Secondary | ICD-10-CM | POA: Diagnosis not present

## 2021-10-01 DIAGNOSIS — K5731 Diverticulosis of large intestine without perforation or abscess with bleeding: Principal | ICD-10-CM | POA: Insufficient documentation

## 2021-10-01 DIAGNOSIS — Z87891 Personal history of nicotine dependence: Secondary | ICD-10-CM | POA: Diagnosis not present

## 2021-10-01 DIAGNOSIS — K219 Gastro-esophageal reflux disease without esophagitis: Secondary | ICD-10-CM | POA: Insufficient documentation

## 2021-10-01 DIAGNOSIS — R58 Hemorrhage, not elsewhere classified: Secondary | ICD-10-CM | POA: Diagnosis not present

## 2021-10-01 DIAGNOSIS — N281 Cyst of kidney, acquired: Secondary | ICD-10-CM | POA: Diagnosis not present

## 2021-10-01 LAB — COMPREHENSIVE METABOLIC PANEL
ALT: 12 U/L (ref 0–44)
AST: 17 U/L (ref 15–41)
Albumin: 3.4 g/dL — ABNORMAL LOW (ref 3.5–5.0)
Alkaline Phosphatase: 86 U/L (ref 38–126)
Anion gap: 10 (ref 5–15)
BUN: 21 mg/dL (ref 8–23)
CO2: 23 mmol/L (ref 22–32)
Calcium: 9.1 mg/dL (ref 8.9–10.3)
Chloride: 105 mmol/L (ref 98–111)
Creatinine, Ser: 1.22 mg/dL (ref 0.61–1.24)
GFR, Estimated: >60 mL/min (ref >60)
Glucose, Bld: 162 mg/dL — ABNORMAL HIGH (ref 70–99)
Potassium: 3.8 mmol/L (ref 3.5–5.1)
Sodium: 138 mmol/L (ref 135–145)
Total Bilirubin: 1.3 mg/dL — ABNORMAL HIGH (ref 0.3–1.2)
Total Protein: 6.3 g/dL — ABNORMAL LOW (ref 6.5–8.1)

## 2021-10-01 LAB — CBC
HCT: 36.2 % — ABNORMAL LOW (ref 39.0–52.0)
Hemoglobin: 11.8 g/dL — ABNORMAL LOW (ref 13.0–17.0)
MCH: 29 pg (ref 26.0–34.0)
MCHC: 32.6 g/dL (ref 30.0–36.0)
MCV: 88.9 fL (ref 80.0–100.0)
Platelets: 278 10*3/uL (ref 150–400)
RBC: 4.07 MIL/uL — ABNORMAL LOW (ref 4.22–5.81)
RDW: 13.6 % (ref 11.5–15.5)
WBC: 10.2 10*3/uL (ref 4.0–10.5)
nRBC: 0 % (ref 0.0–0.2)

## 2021-10-01 LAB — ABO/RH: ABO/RH(D): O POS

## 2021-10-01 LAB — TYPE AND SCREEN
ABO/RH(D): O POS
Antibody Screen: NEGATIVE

## 2021-10-01 LAB — POC OCCULT BLOOD, ED: Fecal Occult Bld: POSITIVE — AB

## 2021-10-01 MED ORDER — PANTOPRAZOLE 80MG IVPB - SIMPLE MED
80.0000 mg | Freq: Once | INTRAVENOUS | Status: AC
  Administered 2021-10-01: 80 mg via INTRAVENOUS
  Filled 2021-10-01: qty 80

## 2021-10-01 MED ORDER — LACTATED RINGERS IV SOLN: INTRAVENOUS | Status: DC

## 2021-10-01 MED ORDER — IOHEXOL 350 MG/ML SOLN
100.0000 mL | Freq: Once | INTRAVENOUS | Status: AC | PRN
  Administered 2021-10-01: 100 mL via INTRAVENOUS

## 2021-10-01 MED ORDER — METOPROLOL TARTRATE 5 MG/5ML IV SOLN
5.0000 mg | Freq: Once | INTRAVENOUS | Status: AC
  Administered 2021-10-01: 5 mg via INTRAVENOUS
  Filled 2021-10-01: qty 5

## 2021-10-01 MED ORDER — ACETAMINOPHEN 325 MG PO TABS
650.0000 mg | ORAL_TABLET | Freq: Four times a day (QID) | ORAL | Status: DC | PRN
  Administered 2021-10-02: 650 mg via ORAL
  Filled 2021-10-01: qty 2

## 2021-10-01 NOTE — Assessment & Plan Note (Addendum)
Single episode of BRBPR this morning, most likely LGIB possibly hemorrhoidal or diverticular. Patient with history of many diverticula of left and right colon and removal of two flat sessile polyps in 2019. Patient follows at [facility] since 1988, when he had bleeding gastric ulcer requiring transfusion. Has not had issues with rectal bleeding since then. Patient s/p hemorrhoidectomy in 1993, and does not recall recurrence since. CTA Ab/Pelvis in the ED identified no acute bleeding but redemonstrated multiple diverticula. Differential above. Hgb 11.8 (11.5-13.9 over past 4 years); WBC 10.2; no electrolyte abnormalities; FOBT positive. [facility] consulted in the ED, and plan to just observe and monitor patient's hemoglobin tonight; no indication for repeat colonoscopy at this time.  Appreciate assistance in this patient's care. -Admit to [facility], Dr. Ardelia Mems, attending -Vitals per floor protocol -F/u GI recs -Daily CBC -Strict I's and O's

## 2021-10-01 NOTE — Assessment & Plan Note (Addendum)
Previously undiagnosed, likely paroxysmal. Patient reports chronically feeling palpitations, particularly when dehydrated or hypoglycemic. In the ED, HR has largely remained in the low 100s, briefly increasing to 130. One-time dose of metoprolol 5 mg IV given. Likely provoked episode in light of rectal bleeding. This likely will not need to be worked up inpatient since it may be chronic, but will continue to monitor. CHADS-VASc 3. -Telemetry monitoring -Echocardiogram to assess for structural changes -no anticoagulation for now given GI bleed -consider repeat IV metoprolol if returning into [facility]

## 2021-10-01 NOTE — ED Provider Notes (Signed)
Emergency Department Provider Note   I have reviewed the triage vital signs and the nursing notes.   HISTORY  Chief Complaint Rectal Bleeding   HPI Steven Simmons is a 81 y.o. male with past history of upper GI bleed (1987), GERD, TIA, and PUD presents to the emergency department with painless rectal bleeding this morning.  Patient states he was in his normal state of health this morning and went to go the bathroom.  He had runny stool mixed with bright red blood.  No black/sticky stool. No fever. No abdominal pain. Patient is followed in the [facility] system. Last colonoscopy was 2019 with no further screening colonoscopy planned due to age. Patient is not anticoagulated.  No pain in the abdomen or rectum.    Past Medical History:  Diagnosis Date   Allergy    Arthritis    shoulders (08/23/2017)   Aspiration pneumonia ([place]) 08/23/2017   Bleeding stomach ulcer 1988   Cataract    Complication of anesthesia 1993   " dont' know the name of it but they gave it to me at [facility] in 1993 "   I was cold, shaking and had a headache for hours afterwards "   GERD (gastroesophageal reflux disease)    Heart murmur    "when I was a child"   History of blood transfusion 1988   "bleeding ulcer"     History of TIA (transient ischemic attack) 07/2015   pt denies this hx on 08/23/2017   Hypertension    Migraine 1959-1980   "did have 4 ~ 2 wks ago; just the aura then" (08/23/2017)   Peptic ulcer disease    Sepsis secondary to UTI ([place]) 08/22/2017    Review of Systems  Constitutional: No fever/chills Eyes: No visual changes. ENT: No sore throat. Cardiovascular: Denies chest pain. Respiratory: Denies shortness of breath. Gastrointestinal: No abdominal pain.  No nausea, no vomiting.  No diarrhea.  No constipation. Positive BRBPR. Genitourinary: Negative for dysuria. Musculoskeletal: Negative for back pain. Skin: Negative for rash. Neurological: Negative for headaches, focal weakness or  numbness.   ____________________________________________   PHYSICAL EXAM:  VITAL SIGNS: ED Triage Vitals  Enc Vitals Group     BP 10/01/21 1128 (!) 146/75     Pulse Rate 10/01/21 1128 81     Resp 10/01/21 1128 14     Temp 10/01/21 1128 98.7 F (37.1 C)     Temp Source 10/01/21 1128 Oral     SpO2 10/01/21 1118 98 %     Weight 10/01/21 1123 200 lb 2.8 oz (90.8 kg)     Height 10/01/21 1123 '5\' 10"'$  (1.778 m)   Constitutional: Alert and oriented. Well appearing and in no acute distress. Very talkative.  Patient somewhat disheveled in his overall appearance.  Eyes: Conjunctivae are normal.  Head: Atraumatic. Nose: No congestion/rhinnorhea. Mouth/Throat: Mucous membranes are moist.  Neck: No stridor.   Cardiovascular: Normal rate, regular rhythm. Good peripheral circulation. Grossly normal heart sounds.   Respiratory: Normal respiratory effort.  No retractions. Lungs CTAB. Gastrointestinal: Soft and nontender. No distention.  Rectal exam performed with patient's verbal consent and nurse chaperone.  Patient has scant bright red blood per rectum.  No melena.  No visible hemorrhoid.  Musculoskeletal: No lower extremity tenderness nor edema. No gross deformities of extremities. Neurologic:  Normal speech and language. No gross focal neurologic deficits are appreciated.  Skin:  Skin is warm, dry and intact. No rash noted.  ____________________________________________   LABS (all labs  ordered are listed, but only abnormal results are displayed)  Labs Reviewed  COMPREHENSIVE METABOLIC PANEL - Abnormal; Notable for the following components:      Result Value   Glucose, Bld 162 (*)    Total Protein 6.3 (*)    Albumin 3.4 (*)    Total Bilirubin 1.3 (*)    All other components within normal limits  CBC - Abnormal; Notable for the following components:   RBC 4.07 (*)    Hemoglobin 11.8 (*)    HCT 36.2 (*)    All other components within normal limits  CBC - Abnormal; Notable for the  following components:   RBC 3.91 (*)    Hemoglobin 11.3 (*)    HCT 35.0 (*)    All other components within normal limits  LIPID PANEL - Abnormal; Notable for the following components:   HDL 37 (*)    LDL Cholesterol 121 (*)    All other components within normal limits  POC OCCULT BLOOD, ED - Abnormal; Notable for the following components:   Fecal Occult Bld POSITIVE (*)    All other components within normal limits  TSH  HEMOGLOBIN A1C  TYPE AND SCREEN  ABO/RH   ____________________________________________  EKG   EKG Interpretation  Date/Time:  Thursday October 01 2021 11:28:58 EDT Ventricular Rate:  78 PR Interval:  135 QRS Duration: 88 QT Interval:  382 QTC Calculation: 436 R Axis:   -14 Text Interpretation: Sinus rhythm Confirmed by Nanda Quinton [phone]) on 10/01/2021 11:51:45 AM       Repeat EKG  EKG Interpretation  Date/Time:  Thursday October 01 2021 14:38:50 EDT Ventricular Rate:  106 PR Interval:  135 QRS Duration: 88 QT Interval:  342 QTC Calculation: 455 R Axis:   7 Text Interpretation: Atrial fibrillation Ventricular premature complex Confirmed by Nanda Quinton [phone]) on 10/01/2021 3:04:38 PM        ____________________________________________  RADIOLOGY  CT ANGIO GI BLEED  Result Date: 10/01/2021 CLINICAL DATA:  Rectal bleeding. EXAM: CTA ABDOMEN AND PELVIS WITHOUT AND WITH CONTRAST TECHNIQUE: Multidetector CT imaging of the abdomen and pelvis was performed using the standard protocol during bolus administration of intravenous contrast. Multiplanar reconstructed images and MIPs were obtained and reviewed to evaluate the vascular anatomy. RADIATION DOSE REDUCTION: This exam was performed according to the departmental dose-optimization program which includes automated exposure control, adjustment of the mA and/or kV according to patient size and/or use of iterative reconstruction technique. CONTRAST:  150m OMNIPAQUE IOHEXOL 350 MG/ML SOLN COMPARISON:  CT scan  08/22/2017 FINDINGS: VASCULAR Aorta: Moderate atherosclerotic calcifications but no aneurysm or dissection. Celiac: Minimal atherosclerotic calcifications. SMA: Mild atherosclerotic calcification at the origin. No stenosis. Renals: Scattered atherosclerotic calcifications but no significant stenosis. IMA: Patent Inflow: Moderate atherosclerotic calcifications but no significant stenosis, aneurysm or dissection. Proximal Outflow: Scattered calcifications. Veins: Unremarkable. Review of the MIP images confirms the above findings. NON-VASCULAR Lower chest: No significant findings. Hepatobiliary: No hepatic lesions or intrahepatic biliary dilatation. The gallbladder is surgically absent. No common bile duct dilatation. Pancreas: No mass, inflammation or ductal dilatation. Spleen: Normal size. No focal lesions. Adrenals/Urinary Tract: Adrenal glands are normal. Simple bilateral renal cysts. These do not require follow-up. No bladder lesions or calculi. Small right-sided diverticulum. Stomach/Bowel: Stomach, duodenum and small bowel are unremarkable. No acute inflammatory process, mass lesions or obstructive findings. No findings suspicious for upper GI bleed. Diffuse colonic diverticulosis without findings for acute diverticulitis. No evidence of a colonic or rectal bleed. Lymphatic: No abdominal or pelvic  lymphadenopathy. Reproductive: Mild prostate gland enlargement. The seminal vesicles are unremarkable. Other: No pelvic mass or adenopathy. No free pelvic fluid collections. No inguinal mass or adenopathy. No abdominal wall hernia or subcutaneous lesions. Musculoskeletal: No significant bony findings. IMPRESSION: 1. No findings suspicious for active GI bleed. 2. Diffuse colonic diverticulosis without findings for acute diverticulitis. 3. No acute abdominal/pelvic findings, mass lesions or adenopathy. 4. Status post cholecystectomy. No biliary dilatation. Aortic Atherosclerosis (ICD10-I70.0). Electronically Signed   By:  Marijo Sanes M.D.   On: 10/01/2021 14:17    ____________________________________________   PROCEDURES  Procedure(s) performed:   Procedures  CRITICAL CARE Performed by: Margette Fast Total critical care time: 35 minutes Critical care time was exclusive of separately billable procedures and treating other patients. Critical care was necessary to treat or prevent imminent or life-threatening deterioration. Critical care was time spent personally by me on the following activities: development of treatment plan with patient and/or surrogate as well as nursing, discussions with consultants, evaluation of patient's response to treatment, examination of patient, obtaining history from patient or surrogate, ordering and performing treatments and interventions, ordering and review of laboratory studies, ordering and review of radiographic studies, pulse oximetry and re-evaluation of patient's condition.  Nanda Quinton, MD Emergency Medicine  ____________________________________________   INITIAL IMPRESSION / ASSESSMENT AND PLAN / ED COURSE  Pertinent labs & imaging results that were available during my care of the patient were reviewed by me and considered in my medical decision making (see chart for details).   This patient is Presenting for Evaluation of rectal bleeding, which does require a range of treatment options, and is a complaint that involves a high risk of morbidity and mortality.  The Differential Diagnoses include bleeding external/internal hemorrhoid, rectal fissure, diverticular bleed, diverticulitis, colitis, peptic ulcer disease, etc.  Critical Interventions-    Medications  lactated ringers infusion (0 mLs Intravenous Stopped 10/02/21 0545)  acetaminophen (TYLENOL) tablet 650 mg (has no administration in time range)  pantoprazole (PROTONIX) 80 mg /NS 100 mL IVPB (0 mg Intravenous Stopped 10/01/21 1358)  iohexol (OMNIPAQUE) 350 MG/ML injection 100 mL (100 mLs Intravenous  Contrast Given 10/01/21 1401)  metoprolol tartrate (LOPRESSOR) injection 5 mg (5 mg Intravenous Given 10/01/21 1604)  QUEtiapine (SEROQUEL) tablet 50 mg (50 mg Oral Given 10/02/21 0433)    Reassessment after intervention:  Developed A fib with RVR but improved with IV metoprolol.    I did obtain Additional Historical Information from EMS. No treatment given en route.   I decided to review pertinent External Data, and in summary last appointment with [facility] in 2019 for screening colonoscopy and thrush mgmt.   Clinical Laboratory Tests Ordered, included fecal occult positive. Mild anemia noted at 11.8. No AKI. O+ blood type.   Radiologic Tests Ordered, included CTA abdomen/pelvis. I independently interpreted the images and agree with radiology interpretation.   Cardiac Monitor Tracing which shows NSR.   Social Determinants of Health Risk patient with a prior smoking history.  Consult complete with  [facility] - They will consult as an inpatient.   Family Medicine - plan for admit  Medical Decision Making: Summary:  Patient presents emergency department for evaluation of bright red blood per rectum starting this morning.  Hemodynamically stable.  Fairly scant blood on exam.  Suspect lower GI bleed source.  No abdominal tenderness or subjective pain.  Plan for CTA abdomen and pelvis/bleed study to evaluate for active diverticular bleed which may be amenable to IR. Will start protonix but lower  suspicion for lower GI bleed source.   03:00 PM  Patient now in A fib with RVR. No hypotension. Will start with metoprolol. Hold anticoagulation with GI bleeding.   Reevaluation with update and discussion with patient. Now in A fib with RVR. Tells me he has felt this way on and off for years. No prior A fib diagnosis. Notes he feels this way when thirsty.    Disposition: admit  ____________________________________________  FINAL CLINICAL IMPRESSION(S) / ED DIAGNOSES  Final diagnoses:  Rectal  bleeding  Atrial fibrillation with RVR ([place])    Note:  This document was prepared using Dragon voice recognition software and may include unintentional dictation errors.  Nanda Quinton, MD, [facility] Emergency Medicine    Alexys Lobello, [facility], MD 10/02/21 [phone]

## 2021-10-01 NOTE — ED Notes (Signed)
Came into pt room and pt was lying on his side half way down in the bed. Pt had removed all monitoring equipment. Educated pt on staying in bed and not removing monitoring equipment d/t pt safety. Pt irritable, but verbalized understanding.

## 2021-10-01 NOTE — ED Notes (Signed)
Patient provided with something to eat and drink at this time.  ?

## 2021-10-01 NOTE — ED Notes (Signed)
Pt transported to CT ?

## 2021-10-01 NOTE — Progress Notes (Signed)
FMTS Brief Progress Note  S: Denies any recurrent episodes of bleeding in the ED. Denies any abdominal pain or palpitations. No SOB. Hopeful for d/c tomorrow, 6/30.   O: BP 123/90   Pulse 77   Temp 98.7 F (37.1 C) (Oral)   Resp 20   Ht '5\' 10"'$  (1.778 m)   Wt 90.8 kg   SpO2 99%   BMI 28.72 kg/m   Gen: NAD, awake, loquacious  Resp: Normal respiratory effort Card: Irregularly irregular, HR 70-90s  Abd: soft, non-distended, non-tender in all quadrants, normoactive bowel sounds  A/P: BRBPR  No recurrent episodes of bleeding since admission. Hemodynamically stable. Abdominal exam benign. CBC in the AM.   A-fib No longer in RVR. Off of anti-coagulation. Echo ordered. Will continue to monitor HR throughout the night and support with rate control medications as necessary.   - Orders reviewed. Labs for AM ordered, which was adjusted as needed.   Sharion Settler, DO 10/01/2021, 10:10 PM PGY-2, [facility] Night Resident  Please page [phone] with questions.

## 2021-10-01 NOTE — ED Triage Notes (Signed)
Pt arrived to ED via EMS from home w/ c/o rectal bleeding. Pt had a BM this morning w/ bright red blood that he reports was mixed in w/ the stool. Denies any hx of hemorrhoids that he is aware of. VSS w/ EMS.

## 2021-10-01 NOTE — Consult Note (Addendum)
Consultation Note   Referring Provider: [facility] PCP: Lattie Haw, MD Primary Gastroenterologist: Silvano Rusk, MD Reason for consultation: GI bleed  Hospital Day: 1  Assessment   # 81 yo male with painless rectal bleeding. Probable diverticular hemorrhage. CTA negative for active bleeding. Hgb 11.8 ( 13.8 in 2020). Tachycardic with monitor reading as Afib. He is not hypotensive.   # Tachycardic 120's / A-fib on monitor. New?    # Remote PUD with bleeding   # HTN  See PMH for additional medical problems   Plan   Monitor H+H and for ongoing bleeding.  Agree with observation overnight.  No indication for a colonoscopy. Known diverticular disease. Only two small polyps on last colonoscopy in 2019.   HPI   Varian Innes is a 81 y.o. male with a past medical history significant for  [facility] of colon cancer in father, personal history of colon polyps, diverticulosis,  TIA, HTN, remote PUD.   See PMH for any additional medical problems.  Patient presented to ED today for evaluation of painless rectal bleeding.  Patient went to have a bowel movement around 7 AM.  He subsequently passed brown stool mixed with a lot of bright red blood.  .  No associated abdominal no rectal pain.  No nausea or vomiting .  No black stools .  No associated dizziness, shortness of breath . There was bright red blood on EDP's rectal exam. CTA  negative for active bleeding.  He does not take blood thinners, no NSAIDs.   Omeprazole is on home med list.  Patient takes this only as needed for heartburn  Data Reviewed:   WBC 10.2 , hemoglobin 11.8 ( 13.8 in 2020), platelets 278 BUN 21, creatinine 1.22 Albumin 3.4, T.Bili 1.3, liver chemistries otherwise normal  CTA >> diverticulosis.  No active GI bleeding  Previous GI Evaluation     July 2019 Screening colonoscopy  - Two 4 to 8 mm polyps in the transverse colon and in the ascending colon, removed  with a cold snare. Resected and retrieved. - Severe diverticulosis in the left colon and in the right colon. - The examination was otherwise normal on direct and retroflexion views. Path : 2 ssp < 1 cm. No recall colonoscopy due to age  Recent Labs and Imaging CT ANGIO GI BLEED  Result Date: 10/01/2021 CLINICAL DATA:  Rectal bleeding. EXAM: CTA ABDOMEN AND PELVIS WITHOUT AND WITH CONTRAST TECHNIQUE: Multidetector CT imaging of the abdomen and pelvis was performed using the standard protocol during bolus administration of intravenous contrast. Multiplanar reconstructed images and MIPs were obtained and reviewed to evaluate the vascular anatomy. RADIATION DOSE REDUCTION: This exam was performed according to the departmental dose-optimization program which includes automated exposure control, adjustment of the mA and/or kV according to patient size and/or use of iterative reconstruction technique. CONTRAST:  166m OMNIPAQUE IOHEXOL 350 MG/ML SOLN COMPARISON:  CT scan 08/22/2017 FINDINGS: VASCULAR Aorta: Moderate atherosclerotic calcifications but no aneurysm or dissection. Celiac: Minimal atherosclerotic calcifications. SMA: Mild atherosclerotic calcification at the origin. No stenosis. Renals: Scattered atherosclerotic calcifications but no significant stenosis. IMA: Patent Inflow: Moderate atherosclerotic calcifications but no significant stenosis, aneurysm or dissection. Proximal Outflow: Scattered calcifications. Veins: Unremarkable. Review of the MIP images confirms the  above findings. NON-VASCULAR Lower chest: No significant findings. Hepatobiliary: No hepatic lesions or intrahepatic biliary dilatation. The gallbladder is surgically absent. No common bile duct dilatation. Pancreas: No mass, inflammation or ductal dilatation. Spleen: Normal size. No focal lesions. Adrenals/Urinary Tract: Adrenal glands are normal. Simple bilateral renal cysts. These do not require follow-up. No bladder lesions or calculi.  Small right-sided diverticulum. Stomach/Bowel: Stomach, duodenum and small bowel are unremarkable. No acute inflammatory process, mass lesions or obstructive findings. No findings suspicious for upper GI bleed. Diffuse colonic diverticulosis without findings for acute diverticulitis. No evidence of a colonic or rectal bleed. Lymphatic: No abdominal or pelvic lymphadenopathy. Reproductive: Mild prostate gland enlargement. The seminal vesicles are unremarkable. Other: No pelvic mass or adenopathy. No free pelvic fluid collections. No inguinal mass or adenopathy. No abdominal wall hernia or subcutaneous lesions. Musculoskeletal: No significant bony findings. IMPRESSION: 1. No findings suspicious for active GI bleed. 2. Diffuse colonic diverticulosis without findings for acute diverticulitis. 3. No acute abdominal/pelvic findings, mass lesions or adenopathy. 4. Status post cholecystectomy. No biliary dilatation. Aortic Atherosclerosis (ICD10-I70.0). Electronically Signed   By: Marijo Sanes M.D.   On: 10/01/2021 14:17    Labs:  Recent Labs    10/01/21 1140  WBC 10.2  HGB 11.8*  HCT 36.2*  PLT 278   Recent Labs    10/01/21 1140  NA 138  K 3.8  CL 105  CO2 23  GLUCOSE 162*  BUN 21  CREATININE 1.22  CALCIUM 9.1   Recent Labs    10/01/21 1140  PROT 6.3*  ALBUMIN 3.4*  AST 17  ALT 12  ALKPHOS 86  BILITOT 1.3*   No results for input(s): "HEPBSAG", "HCVAB", "HEPAIGM", "HEPBIGM" in the last 72 hours. No results for input(s): "LABPROT", "INR" in the last 72 hours.  Past Medical History:  Diagnosis Date   Allergy    Arthritis    shoulders (08/23/2017)   Aspiration pneumonia ([place]) 08/23/2017   Bleeding stomach ulcer 1988   Cataract    Complication of anesthesia 1993   " dont' know the name of it but they gave it to me at [facility] in 1993 "   I was cold, shaking and had a headache for hours afterwards "   GERD (gastroesophageal reflux disease)    Heart murmur    "when I was a child"    History of blood transfusion 1988   "bleeding ulcer"     History of TIA (transient ischemic attack) 07/2015   pt denies this hx on 08/23/2017   Hypertension    Migraine 1959-1980   "did have 4 ~ 2 wks ago; just the aura then" (08/23/2017)   Peptic ulcer disease    Sepsis secondary to UTI ([place]) 08/22/2017    Past Surgical History:  Procedure Laterality Date   CATARACT EXTRACTION W/ INTRAOCULAR LENS  IMPLANT, BILATERAL Bilateral    COLONOSCOPY  2012 ?   EXCISIONAL HEMORRHOIDECTOMY  1993   LAPAROSCOPIC CHOLECYSTECTOMY      Family History  Problem Relation Age of Onset   Heart disease Mother    Hypertension Mother    Heart disease Father    Hypertension Father    Colon cancer Father    Esophageal cancer Neg Hx    Rectal cancer Neg Hx    Stomach cancer Neg Hx     Prior to Admission medications   Medication Sig Start Date End Date Taking? Authorizing Provider  acetaminophen (TYLENOL) 325 MG tablet Take 650 mg by mouth every  6 (six) hours as needed for mild pain.    [provider]  lisinopril-hydrochlorothiazide (ZESTORETIC) 20-12.5 MG tablet Take 2 tablets by mouth daily. 10/14/20   Lattie Haw, MD  omeprazole (PRILOSEC) 40 MG capsule Take 1 capsule (40 mg total) by mouth every morning. Take  30-60 minutes before breakfast. Patient taking differently: Take 40 mg by mouth daily.  10/20/17   Willia Craze, NP  ranitidine (ZANTAC) 150 MG tablet Take 1 tablet (150 mg total) by mouth at bedtime. Patient not taking: Reported on 12/09/2018 09/02/17 12/09/18  Willia Craze, NP    No current facility-administered medications for this encounter.   Current Outpatient Medications  Medication Sig Dispense Refill   acetaminophen (TYLENOL) 325 MG tablet Take 650 mg by mouth every 6 (six) hours as needed for mild pain.     lisinopril-hydrochlorothiazide (ZESTORETIC) 20-12.5 MG tablet Take 2 tablets by mouth daily. 180 tablet 2   omeprazole (PRILOSEC) 40 MG capsule Take 1 capsule  (40 mg total) by mouth every morning. Take  30-60 minutes before breakfast. (Patient taking differently: Take 40 mg by mouth daily. ) 30 capsule 2    Allergies as of 10/01/2021 - Review Complete 10/01/2021  Allergen Reaction Noted   Ceclor [cefaclor] Other (See Comments) 05/18/2011   Veralipride  05/18/2011   Penicillins Other (See Comments)    Amlodipine Other (See Comments) 02/13/2014   Antihistamines, chlorpheniramine-type Other (See Comments) 05/18/2011   Beta adrenergic blockers Other (See Comments)    Celebrex [celecoxib] Other (See Comments) 06/29/2012   Ibuprofen Other (See Comments) 06/29/2012   Lipitor [atorvastatin] Hypertension 04/09/2014   Nutrasweet aspartame [aspartame] Other (See Comments) 06/29/2012    Social History   Socioeconomic History   Marital status: Single    Spouse name: Not on file   Number of children: Not on file   Years of education: Not on file   Highest education level: Not on file  Occupational History   Not on file  Tobacco Use   Smoking status: Former    Years: 30.00    Types: Cigarettes, Cigars    Quit date: 04/20/1977    Years since quitting: 44.4   Smokeless tobacco: Never  Vaping Use   Vaping Use: Never used  Substance and Sexual Activity   Alcohol use: No    Comment: 08/23/2017 "used to drink alot of beer; can't stand it anymore; stopped in 1981"   Drug use: No   Sexual activity: Not Currently  Other Topics Concern   Not on file  Social History Narrative   Not on file   Social Determinants of Health   Financial Resource Strain: Not on file  Food Insecurity: Not on file  Transportation Needs: Not on file  Physical Activity: Not on file  Stress: Not on file  Social Connections: Not on file  Intimate Partner Violence: Not on file    Review of Systems: All systems reviewed and negative except where noted in HPI.  Physical Exam: Vital signs in last 24 hours: Temp:  [98.7 F (37.1 C)] 98.7 F (37.1 C) (06/29 1128) Pulse  Rate:  [76-116] 84 (06/29 1400) Resp:  [14-23] 14 (06/29 1400) BP: (145-189)/(66-150) 165/77 (06/29 1400) SpO2:  [92 %-99 %] 99 % (06/29 1400) Weight:  [90.8 kg] 90.8 kg (06/29 1123)    General:  Alert talkative male in NAD Psych:  Pleasant, cooperative. Normal mood and affect Eyes: Pupils equal, no icterus. Conjunctive pink Ears:  Normal auditory acuity Nose: No deformity, discharge or  lesions Neck:  Supple, no masses felt Lungs:  Clear to auscultation.  Heart: Tachycardic, irregular rhythm. Afib on monitor.  No lower extremity edema Abdomen:  Soft, nondistended, nontender, active bowel sounds, no masses felt Rectal : not repeated. Red blood in vault on EDP's exam Msk: Symmetrical without gross deformities.  Neurologic:  Alert, oriented, grossly normal neurologically Skin:  Intact without significant lesions.    Intake/Output from previous day: No intake/output data recorded. Intake/Output this shift:  No intake/output data recorded.  Active Problems:   * No active hospital problems. Tye Savoy, NP-C @  10/01/2021, 2:55 PM  GI ATTENDING  History, laboratories, x-rays, prior colonoscopy report personally reviewed.  Patient seen and examined.  Agree with comprehensive consultation note as outlined above.  Patient presents with painless hematochezia.  Hemodynamically stable.  Negative CTA.  Most likely diverticular bleed.  Agree with supportive measures as outlined.  We will follow.  Docia Chuck. Geri Seminole., M.D. [facility] Division of Gastroenterology

## 2021-10-01 NOTE — Hospital Course (Addendum)
Steven Simmons is a 81 y.o. male who was admitted to [facility] after an episode of bright red blood per rectum. Hospital course outlined by problem. See his H&P for additional information.   BRBPR Had a single episode of bloody stool on the morning of admission. FOBT positive in the ED. He remained hemodynamically stable, Hgb 11.8>11.3 (around his baseline) and CTA Abd/Pelv without acute bleeding but did note multiple diverticula without diverticulosis. GI was consulted and recommended serial H/H monitoring but deferred repeat scope. Did not require blood transfusion. His bleeding was thought to be diverticular or hemorrhoidal in nature. Patient to continue following outpatient with [facility].  Atrial Fibrillation with RVR Had episode of A-fib with RVR, rate up to 130s. This responded well to a one-time dose of IV metoprolol 5 mg. CHADS-VASc of 3, but anti-coagulation was held due to GI bleed. Patient refused echocardiogram inpatient, changing his mind while en route. Recommend obtaining outpatient.  PCP Issues for Follow-Up: Recheck CBC  Outpatient echocardiogram Consider iron supplementation  Consider starting anti-coagulation outpatient if appropriate

## 2021-10-01 NOTE — Assessment & Plan Note (Signed)
Patient with SBP up to 189 and DBP up to 97.  Most recent blood pressure 134/97.  Patient reports previous control of blood pressure with lisinopril-hydrochlorothiazide 40-25 mg, but has not taken in at least 2 weeks; last prescribed 11/2019. - Monitor blood pressure - Consider restarting lisinopril-hydrochlorothiazide

## 2021-10-01 NOTE — H&P (Addendum)
Hospital Admission History and Physical Service Pager: [phone]  Patient name: Steven Simmons Medical record number: [id number] Date of Birth: 21-Feb-1941 Age: 81 y.o. Gender: male  Primary Care Provider: Lattie Haw, MD Consultants: GI Code Status: Full Preferred Emergency Contact: None provided today; will follow up.  Chief Complaint: rectal bleeding  Assessment and Plan: Steven Simmons is a 81 y.o. male presenting with painless rectal bleeding which began this morning. Differential for presentation of this includes bleeding diverticula vs hemorrhoids vs colorectal polyps, need to rule out malignancy; less likely ulcerative colitis vs proctitis vs infection.   * BRBPR (bright red blood per rectum) Single episode of BRBPR this morning, most likely LGIB possibly hemorrhoidal or diverticular. Patient with history of many diverticula of left and right colon and removal of two flat sessile polyps in 2019. Patient follows at [facility] since 1988, when he had bleeding gastric ulcer requiring transfusion. Has not had issues with rectal bleeding since then. Patient s/p hemorrhoidectomy in 1993, and does not recall recurrence since. CTA Ab/Pelvis in the ED identified no acute bleeding but redemonstrated multiple diverticula. Differential above. Hgb 11.8 (11.5-13.9 over past 4 years); WBC 10.2; no electrolyte abnormalities; FOBT positive. [facility] consulted in the ED, and plan to just observe and monitor patient's hemoglobin tonight; no indication for repeat colonoscopy at this time.  Appreciate assistance in this patient's care. -Admit to FMTS, Dr. Ardelia Mems, attending -Vitals per floor protocol -F/u GI recs -Daily CBC -Strict I's and O's  Atrial fibrillation with RVR ([place]) Previously undiagnosed, likely paroxysmal. Patient reports chronically feeling palpitations, particularly when dehydrated or hypoglycemic. In the ED, HR has largely remained in the low 100s, briefly increasing to 130.  One-time dose of metoprolol 5 mg IV given. Likely provoked episode in light of rectal bleeding. This likely will not need to be worked up inpatient since it may be chronic, but will continue to monitor. CHADS-VASc 3. -Telemetry monitoring -Echocardiogram to assess for structural changes -no anticoagulation for now given GI bleed -consider repeat IV metoprolol if returning into RVR   HTN (hypertension) Patient with SBP up to 189 and DBP up to 97.  Most recent blood pressure 134/97.  Patient reports previous control of blood pressure with lisinopril-hydrochlorothiazide 40-25 mg, but has not taken in at least 2 weeks; last prescribed 11/2019. - Monitor blood pressure - Consider restarting lisinopril-hydrochlorothiazide      FEN/GI: Regular VTE Prophylaxis: SCDs  Disposition: Admit to FMTS, med-telemetry; possible discharge home tomorrow  History of Present Illness:  Steven Simmons is a 81 y.o. male presenting with rectal bleeding. First time since 1987-1988. Had an urgent BM, of which he was incontinent, and noticed bright red "strawberry glaze" within stool. No pain with BM nor abdominal pain. Has had urgent BM frequently. No incontinence prior to today, one BM per day usually. Endorses urinary frequency and sometimes urgency.  He denies fever, sweats, chills, recent weight loss. No hematuria, urinary incontinence.  Last seen at [facility]; doesn't like it. Believes he has established care elsewhere.   In the ED, patient initially presented in sinus rhythm, but had transformation of rhythm into atrial fibrillation with RVR.  Given one-time dose of metoprolol 5 mg IV; anticoagulation held as patient with GI bleed.  No hypotension.  CT angio abdomen/pelvis with no signs of bleeds, but FOBT positive.  VSS, no leukocytosis, and Hgb stable as compared to previous.   Pertinent Past Medical History: Diverticulosis Bleeding stomach ulcer c/b need for transfusion (1988)  PUD GERD-no  longer taking Prilosec HTN-has not taken lisinopril-HCTZ in at least 2 weeks HLD BPH-reported taking OTC supplement at 1 point, which he is no longer taking Pre-diabetes TIA (2017)  Remainder reviewed in history tab.   Pertinent Past Surgical History: Excisional hemorrhoidectomy (1993) Colonoscopy (2012, 2019)   Remainder reviewed in history tab.  Pertinent Social History: Tobacco use: Former, quit 44 years ago after ~21 years of use, quit in 1981. 2-3 ppd and cigars. Alcohol use: 08/23/2017 "used to drink alot of beer; can't stand it anymore; stopped in 1981" Other Substance use: Denies Lives alone with dog.  Reports independence with ADLs  Pertinent Family History: Family History  Problem Relation Age of Onset   Heart disease Mother    Hypertension Mother    Heart disease Father    Hypertension Father    Colon cancer Father    Esophageal cancer Neg Hx    Rectal cancer Neg Hx    Stomach cancer Neg Hx   Uncle- cancer  Remainder reviewed in history tab.   Important Outpatient Medications: Lisinopril 20-12.5 mg, 2 tablets daily, last taken 2-3 weeks ago. Omeprazole 40 mg qAM- no longer taking.  Objective: BP (!) 134/97   Pulse 99   Temp 98.7 F (37.1 C) (Oral)   Resp 17   Ht '5\' 10"'$  (1.778 m)   Wt 90.8 kg   SpO2 96%   BMI 28.72 kg/m  Exam: General: Disheveled and malodorous elderly male lying in bed in NAD Eyes: Clear sclerae, no conjunctival injection, no purulence ENTM: Moist mucous membranes, poor dentition Neck: Supple Cardiovascular: normal rate, irregularly irregular rhythm; no murmurs/rubs/gallops.  JVD apparent. Respiratory: Dry cough.  CTA x2.  Normal WOB on room air Gastrointestinal: Abdomen soft, NT/ND MSK: Nonedematous bilateral lower extremities Derm: Lesions of bilateral arms consistent with seborrheic keratoses. Neuro: EOMI.  Intact strength in all extremities.  Psych: Very talkative, tangential, but normal affect  Labs:  CBC BMET  Recent  Labs  Lab 10/01/21 1140  WBC 10.2  HGB 11.8*  HCT 36.2*  PLT 278   Recent Labs  Lab 10/01/21 1140  NA 138  K 3.8  CL 105  CO2 23  BUN 21  CREATININE 1.22  GLUCOSE 162*  CALCIUM 9.1     EKG: Atrial fibrillation, tachycardia to 106.   Imaging Studies Performed: CT Angio Abd/Pelvis IMPRESSION: 1. No findings suspicious for active GI bleed. 2. Diffuse colonic diverticulosis without findings for acute diverticulitis. 3. No acute abdominal/pelvic findings, mass lesions or adenopathy. 4. Status post cholecystectomy. No biliary dilatation.   Aortic Atherosclerosis (ICD10-I70.0).     Electronically Signed   By: Marijo Sanes M.D.   On: 10/01/2021 14:17   Rosezetta Schlatter, MD 10/01/2021, 6:47 PM PGY-1, [facility] Intern pager: [phone], text pages welcome Secure chat group [place]    I was personally present and performed or re-performed the history, physical exam and medical decision making activities of this service and have verified that the service and findings are accurately documented in the resident's note.  Zola Button, MD                  10/01/2021, 6:50 PM

## 2021-10-02 ENCOUNTER — Other Ambulatory Visit (HOSPITAL_COMMUNITY): Payer: Medicare Other

## 2021-10-02 ENCOUNTER — Observation Stay (HOSPITAL_COMMUNITY): Payer: Medicare Other

## 2021-10-02 ENCOUNTER — Other Ambulatory Visit (HOSPITAL_COMMUNITY): Payer: Self-pay

## 2021-10-02 ENCOUNTER — Telehealth (HOSPITAL_COMMUNITY): Payer: Self-pay

## 2021-10-02 DIAGNOSIS — K625 Hemorrhage of anus and rectum: Secondary | ICD-10-CM | POA: Diagnosis not present

## 2021-10-02 DIAGNOSIS — D62 Acute posthemorrhagic anemia: Secondary | ICD-10-CM | POA: Diagnosis not present

## 2021-10-02 LAB — LIPID PANEL
Cholesterol: 174 mg/dL (ref 0–200)
HDL: 37 mg/dL — ABNORMAL LOW (ref >40)
LDL Cholesterol: 121 mg/dL — ABNORMAL HIGH (ref 0–99)
Total CHOL/HDL Ratio: 4.7 RATIO
Triglycerides: 80 mg/dL (ref <150)
VLDL: 16 mg/dL (ref 0–40)

## 2021-10-02 LAB — CBC
HCT: 35 % — ABNORMAL LOW (ref 39.0–52.0)
Hemoglobin: 11.3 g/dL — ABNORMAL LOW (ref 13.0–17.0)
MCH: 28.9 pg (ref 26.0–34.0)
MCHC: 32.3 g/dL (ref 30.0–36.0)
MCV: 89.5 fL (ref 80.0–100.0)
Platelets: 284 10*3/uL (ref 150–400)
RBC: 3.91 MIL/uL — ABNORMAL LOW (ref 4.22–5.81)
RDW: 13.8 % (ref 11.5–15.5)
WBC: 7.9 10*3/uL (ref 4.0–10.5)
nRBC: 0 % (ref 0.0–0.2)

## 2021-10-02 LAB — TSH: TSH: 3.11 u[IU]/mL (ref 0.350–4.500)

## 2021-10-02 MED ORDER — QUETIAPINE FUMARATE 25 MG PO TABS
50.0000 mg | ORAL_TABLET | Freq: Every day | ORAL | Status: AC
  Administered 2021-10-02: 50 mg via ORAL
  Filled 2021-10-02: qty 2

## 2021-10-02 MED ORDER — CARVEDILOL 6.25 MG PO TABS
6.2500 mg | ORAL_TABLET | Freq: Two times a day (BID) | ORAL | 0 refills | Status: DC
  Filled 2021-10-02: qty 60, 30d supply, fill #0

## 2021-10-02 NOTE — Progress Notes (Signed)
Interim Progress Note  Paged by RN stating that patient got out of bed and pulled out his second IV. In to check on patient, by this time he was back in bed and eyes were closed so I did not wake him up. D/w RN that redirection is likely best and given RN is busy with several other patients, a sitter would be ideal. I have placed order for sitter- though per order, must trial telesitter first.   Reassuringly, his Hgb is stable and HR has been controlled throughout the night. Will consider d/c cardiac monitoring if problems persist.

## 2021-10-02 NOTE — ED Notes (Signed)
In room to find patient without clothes on and blood on floor. Patient restless and fidgeting around the room. Assisted patient into the shower due to patient being covered in feces and blood. Patient refused the use of soap. Patient given clean gown, brief, and paper scrub pants. Patient room sanitized with clorox wipes and new linen placed. Patient sitting in bed at this time eating breakfast and watching TV

## 2021-10-02 NOTE — Progress Notes (Signed)
Patient is not cooperative to stay still for echo at this time.

## 2021-10-02 NOTE — ED Notes (Signed)
At nurses station charting on another pt noted pt alarms for the monitor going off. Turned around and looked and noted pt to be standing in the door way in just an unbuttoned shirt with blood running down his arm. Pt has removed monitor, pulled PIV in Rt FA out and bleeding on the floor and himself. Charge nurse at bedside with this RN at this time. Pt was redirected into bed to sit down. Pt and room cleaned up at this time with sheets changed and pt changed into a hosp gown. Admit provider paged. Pt was educated at this time the importance of staying in bed and using the call bell and to not get out of bed without someone in the room

## 2021-10-02 NOTE — ED Notes (Signed)
Received verbal report from [place] at this time

## 2021-10-02 NOTE — ED Notes (Signed)
Patient refuses vitals

## 2021-10-02 NOTE — ED Notes (Addendum)
Patient transported to ECHO.

## 2021-10-02 NOTE — ED Notes (Signed)
Verbal report given to [facility] RN at this time

## 2021-10-02 NOTE — ED Notes (Signed)
Patient was being transported to ECHO when he voluntarily jumped out of bed and refused to be taken to ECHO. Patient stated "he does not need an ECHO and tired of people telling him what to do". Patient saying he is ready to go and does not want to be here.

## 2021-10-02 NOTE — ED Notes (Signed)
Spoke with admit provider

## 2021-10-02 NOTE — Progress Notes (Signed)
FMTS Brief Progress Note  S:Paged by RN that patient was confused, that he got up out of bed, pulled out his IV and was stating that he was going to go home today. Went in to see the patient, RN was at the bedside as well. He was stating that he was worried about his dog because he only left out enough food and water for one day. Also mentioned how his phone was going to die and he needed that to get a ride home. He denied any other complaints, stated he felt fine. He had dried blood all on his hands and feet- he reports he probably cut himself on something but wasn't able to say much more than that. RN noted that he had pulled out his IV and bled everywhere. No active bleeding.    O: BP (!) 188/81   Pulse 76   Temp 98.7 F (37.1 C) (Oral)   Resp 13   Ht '5\' 10"'$  (1.778 m)   Wt 90.8 kg   SpO2 98%   BMI 28.72 kg/m   Gen: Awake, alert, oriented x4. [place] Resp: Normal respiratory effort Card: A-fib, rate 70s Skin: Dry with dried blood around fingers and feet Neuro: No focal deficits, speech is non-slurred. AxO x4. Moving all extremities spontaneously. Able to follow commands.     A/P: Delirium Likely sundowning. Ordered for Seroquel 50 mg which he had just received prior to my encounter. On my encounter he is redirectable and oriented, no neuro deficits. Additionally, no falls or head trauma, so I doubt any acute intracranial pathology. Defer any head imaging at this time. Delirium precautions ordered and I turned down the lights in his room. Will continue to monitor as patient appears to have settled down at this time. Lab in the room now to collect 5AM labs. If his hemoglobin remains stable, I anticipate he may be discharged later today.    Sharion Settler, DO 10/02/2021, 4:55 AM PGY-2, [facility] Night Resident  Please page [phone] with questions.

## 2021-10-02 NOTE — ED Notes (Signed)
Noted pt alarms for the monitor going off. Went to check on pt and found pt sitting up in bed with blood every where. Pt had pulled out his last PIV at this time. Pt clothes changed, cleaned pt up. Charge nurse made aware and requested for admit provider to be paged at this time

## 2021-10-02 NOTE — Discharge Summary (Signed)
[facility] Discharge Summary  Patient name: Steven Simmons Medical record number: [id number] Date of birth: 03/26/1941 Age: 81 y.o. Gender: male Date of Admission: 10/01/2021  Date of Discharge: 10/02/2021 Admitting Physician: Leeanne Rio, MD  Primary Care Provider: Lattie Haw, MD Consultants: GI  Indication for Hospitalization: Painless rectal bleeding x 1 episode  Discharge Diagnoses/Problem List:  BRBPR Atrial fibrillation with RVR  Disposition: Home  Discharge Condition: Stable  Discharge Exam: Patient sundowning and pulled out PIV x 2 last night. He was given PRN Seroquel 50 mg.   This AM, patient seen at bedside. He has had a BM this AM and reports dried blood only, no BRBPR. He has no acute concerns or complaints and feels ready to discharge.   General: Disheveled, malodorous male in hospital gown and dried blood on hands Cardio: No JVD apparent Pulmonary: Normal WOB on RA Rectum: No apparent dried blood observed on rectum or hospital pants.  Brief Hospital Course:  Steven Simmons is a 81 y.o. male who was admitted to [facility] after an episode of bright red blood per rectum. Hospital course outlined by problem. See his H&P for additional information.   BRBPR Had a single episode of bloody stool on the morning of admission. FOBT positive in the ED. He remained hemodynamically stable, Hgb 11.8>11.3 (around his baseline) and CTA Abd/Pelv without acute bleeding but did note multiple diverticula without diverticulosis. GI was consulted and recommended serial H/H monitoring but deferred repeat scope. Did not require blood transfusion. His bleeding was thought to be diverticular or hemorrhoidal in nature. Patient to continue following outpatient with [facility].  Atrial Fibrillation with RVR Had episode of A-fib with RVR, rate up to 130s. This responded well to a one-time dose of IV metoprolol 5 mg. CHADS-VASc of 3, but  anti-coagulation was held due to GI bleed. Patient refused echocardiogram inpatient, changing his mind while en route. Recommend obtaining outpatient.  PCP Issues for Follow-Up: Recheck CBC  Outpatient echocardiogram Consider iron supplementation  Consider starting anti-coagulation outpatient if appropriate     Significant Procedures: N/A  Significant Labs and Imaging:  Recent Labs  Lab 10/01/21 1140 10/02/21 0502  WBC 10.2 7.9  HGB 11.8* 11.3*  HCT 36.2* 35.0*  PLT 278 284   Recent Labs  Lab 10/01/21 1140  NA 138  K 3.8  CL 105  CO2 23  GLUCOSE 162*  BUN 21  CREATININE 1.22  CALCIUM 9.1  ALKPHOS 86  AST 17  ALT 12  ALBUMIN 3.4*   CTA ABDOMEN AND PELVIS WITHOUT AND WITH CONTRAST FINDINGS: VASCULAR   Aorta: Moderate atherosclerotic calcifications but no aneurysm or dissection.   Celiac: Minimal atherosclerotic calcifications.   SMA: Mild atherosclerotic calcification at the origin. No stenosis.   Renals: Scattered atherosclerotic calcifications but no significant stenosis.   IMA: Patent   Inflow: Moderate atherosclerotic calcifications but no significant stenosis, aneurysm or dissection.   Proximal Outflow: Scattered calcifications.   Veins: Unremarkable.   Review of the MIP images confirms the above findings.   NON-VASCULAR   Lower chest: No significant findings.   Hepatobiliary: No hepatic lesions or intrahepatic biliary dilatation. The gallbladder is surgically absent. No common bile duct dilatation.   Pancreas: No mass, inflammation or ductal dilatation.   Spleen: Normal size. No focal lesions.   Adrenals/Urinary Tract: Adrenal glands are normal.   Simple bilateral renal cysts. These do not require follow-up. No bladder lesions or calculi. Small right-sided diverticulum.   Stomach/Bowel: Stomach,  duodenum and small bowel are unremarkable. No acute inflammatory process, mass lesions or obstructive findings. No findings suspicious for  upper GI bleed.   Diffuse colonic diverticulosis without findings for acute diverticulitis. No evidence of a colonic or rectal bleed.   Lymphatic: No abdominal or pelvic lymphadenopathy.   Reproductive: Mild prostate gland enlargement. The seminal vesicles are unremarkable.   Other: No pelvic mass or adenopathy. No free pelvic fluid collections. No inguinal mass or adenopathy. No abdominal wall hernia or subcutaneous lesions.   Musculoskeletal: No significant bony findings.   IMPRESSION: 1. No findings suspicious for active GI bleed. 2. Diffuse colonic diverticulosis without findings for acute diverticulitis. 3. No acute abdominal/pelvic findings, mass lesions or adenopathy. 4. Status post cholecystectomy. No biliary dilatation.   Aortic Atherosclerosis (ICD10-I70.0).     Electronically Signed   By: Marijo Sanes M.D.   On: 10/01/2021 14:17   Results/Tests Pending at Time of Discharge: N/A  Discharge Medications:  Allergies as of 10/02/2021       Reactions   Ceclor [cefaclor] Other (See Comments)   Makes my heart slow and I feel like I am fading away   Veralipride    Gums bleed, tooth aches   Penicillins Other (See Comments)   Has patient had a PCN reaction causing immediate rash, facial/tongue/throat swelling, SOB or lightheadedness with hypotension: yes- only rash Has patient had a PCN reaction causing severe rash involving mucus membranes or skin necrosis: No Has patient had a PCN reaction that required hospitalization: No Has patient had a PCN reaction occurring within the last 10 years: No If all of the above answers are "NO", then may proceed with Cephalosporin use.   Amlodipine Other (See Comments)   Gum bleeding   Antihistamines, Chlorpheniramine-type Other (See Comments)   Dizziness and nose bleeds   Beta Adrenergic Blockers Other (See Comments)   Gums bleed   Celebrex [celecoxib] Other (See Comments)   Stomach pain   Ibuprofen Other (See Comments)    Stomach pain     Lipitor [atorvastatin] Hypertension   Nutrasweet Aspartame [aspartame] Other (See Comments)   Memory loss        Medication List     STOP taking these medications    lisinopril-hydrochlorothiazide 20-12.5 MG tablet Commonly known as: ZESTORETIC   omeprazole 40 MG capsule Commonly known as: PriLOSEC       TAKE these medications    carvedilol 6.25 MG tablet Commonly known as: Coreg Take 1 tablet (6.25 mg total) by mouth 2 (two) times daily.        Discharge Instructions: Please refer to Patient Instructions section of EMR for full details.  Patient was counseled important signs and symptoms that should prompt return to medical care, changes in medications, dietary instructions, activity restrictions, and follow up appointments.   Follow-Up Appointments:   Rosezetta Schlatter, MD 10/02/2021, 11:51 AM PGY-1, [place]

## 2021-10-02 NOTE — ED Notes (Signed)
Pt placed back on the monitor, admit provider at bedside

## 2021-10-02 NOTE — ED Notes (Signed)
Patient just ambulated to the restroom. He had a bowel movement and there is blood in his stool and dripping down his leg.

## 2021-10-02 NOTE — Telephone Encounter (Signed)
Reached out to patient unable to leave voicemail telephone gave busy signal.

## 2021-10-02 NOTE — Discharge Instructions (Addendum)
Dear Mr. Steven Simmons, I am so glad you are feeling better and can be discharged today (10/02/2021)! You were admitted for painless rectal bleeding.   Please see the following instructions: Please attend the scheduled appointment or make an appointment to follow-up with the other specialists seen below. [facility] Please see your primary care / family doctor for your other medical issues, concerns and or health care needs. Follow-up at [facility] 7/6 at 9:30 AM. Please see attached guide for [facility] to schedule transportation. NEW meds:  Carvedilol 6.25 mg 2 times daily for blood pressure.  Report any adverse effects and or reactions from the medicines to your outpatient provider promptly. Do not engage in alcohol and or illegal drug use while on prescription medicines. In the event of worsening symptoms, call 911and/or go to the nearest ED for appropriate evaluation and treatment of symptoms.  It was a pleasure meeting you, Mr. Steven Simmons.  I wish you the best, and hope you stay happy and healthy!  Rosezetta Schlatter, MD 10/02/2021

## 2021-10-02 NOTE — Progress Notes (Addendum)
Daily Progress Note  Hospital Day: 2  Chief Complaint:   Brief History Steven Simmons is a 81 y.o. male with a pmh not limited to  [facility] of colon cancer in father, personal history of colon polyps, diverticulosis,  TIA, HTN, remote PUD. Admitted yesterday with painless rectal bleeding x 1 episode   Assessment   # 82 yo male with painless rectal bleeding. CTA negative. No further GI bleeding. Hgb stable overnight 11.8 >> 11.3. VSS stable this am ( BP elevated). Diverticular bleed is possible. Given only the once episode this could have also been hemorrhoidal bleeding.  Interval history: No further bleeding. Hgb stable at 11.3. He is dressed and says he is being discharged  # New Afib with RVR. Did not cooperate for echo to be done. Heartrate controlled  # Delirium. Confused overnight. Pulled out IVs, took off clothes. Found with dried blood on body but from the notes it sounds like that was related to IV , not GI bleeding.    Plan   No further GI evaluation needed. Will sign off   Subjective   Says he had a BM today with a small amount of old, dried blood  Objective   Imaging:  CT ANGIO GI BLEED  Result Date: 10/01/2021 CLINICAL DATA:  Rectal bleeding. EXAM: CTA ABDOMEN AND PELVIS WITHOUT AND WITH CONTRAST TECHNIQUE: Multidetector CT imaging of the abdomen and pelvis was performed using the standard protocol during bolus administration of intravenous contrast. Multiplanar reconstructed images and MIPs were obtained and reviewed to evaluate the vascular anatomy. RADIATION DOSE REDUCTION: This exam was performed according to the departmental dose-optimization program which includes automated exposure control, adjustment of the mA and/or kV according to patient size and/or use of iterative reconstruction technique. CONTRAST:  195m OMNIPAQUE IOHEXOL 350 MG/ML SOLN COMPARISON:  CT scan 08/22/2017 FINDINGS: VASCULAR Aorta: Moderate atherosclerotic calcifications but no aneurysm or  dissection. Celiac: Minimal atherosclerotic calcifications. SMA: Mild atherosclerotic calcification at the origin. No stenosis. Renals: Scattered atherosclerotic calcifications but no significant stenosis. IMA: Patent Inflow: Moderate atherosclerotic calcifications but no significant stenosis, aneurysm or dissection. Proximal Outflow: Scattered calcifications. Veins: Unremarkable. Review of the MIP images confirms the above findings. NON-VASCULAR Lower chest: No significant findings. Hepatobiliary: No hepatic lesions or intrahepatic biliary dilatation. The gallbladder is surgically absent. No common bile duct dilatation. Pancreas: No mass, inflammation or ductal dilatation. Spleen: Normal size. No focal lesions. Adrenals/Urinary Tract: Adrenal glands are normal. Simple bilateral renal cysts. These do not require follow-up. No bladder lesions or calculi. Small right-sided diverticulum. Stomach/Bowel: Stomach, duodenum and small bowel are unremarkable. No acute inflammatory process, mass lesions or obstructive findings. No findings suspicious for upper GI bleed. Diffuse colonic diverticulosis without findings for acute diverticulitis. No evidence of a colonic or rectal bleed. Lymphatic: No abdominal or pelvic lymphadenopathy. Reproductive: Mild prostate gland enlargement. The seminal vesicles are unremarkable. Other: No pelvic mass or adenopathy. No free pelvic fluid collections. No inguinal mass or adenopathy. No abdominal wall hernia or subcutaneous lesions. Musculoskeletal: No significant bony findings. IMPRESSION: 1. No findings suspicious for active GI bleed. 2. Diffuse colonic diverticulosis without findings for acute diverticulitis. 3. No acute abdominal/pelvic findings, mass lesions or adenopathy. 4. Status post cholecystectomy. No biliary dilatation. Aortic Atherosclerosis (ICD10-I70.0). Electronically Signed   By: PMarijo SanesM.D.   On: 10/01/2021 14:17    Lab Results: Recent Labs    10/01/21 1140  10/02/21 0502  WBC 10.2 7.9  HGB 11.8* 11.3*  HCT 36.2* 35.0*  PLT 278 284   BMET Recent Labs    10/01/21 1140  NA 138  K 3.8  CL 105  CO2 23  GLUCOSE 162*  BUN 21  CREATININE 1.22  CALCIUM 9.1   LFT Recent Labs    10/01/21 1140  PROT 6.3*  ALBUMIN 3.4*  AST 17  ALT 12  ALKPHOS 86  BILITOT 1.3*   PT/INR No results for input(s): "LABPROT", "INR" in the last 72 hours.   Scheduled inpatient medications:   Continuous inpatient infusions:   lactated ringers Stopped (10/02/21 0545)   PRN inpatient medications: acetaminophen  Vital signs in last 24 hours: Temp:  [98.7 F (37.1 C)] 98.7 F (37.1 C) (06/29 1128) Pulse Rate:  [69-116] 76 (06/30 0440) Resp:  [12-30] 13 (06/30 0440) BP: (123-189)/(66-150) 188/81 (06/30 0440) SpO2:  [92 %-100 %] 98 % (06/30 0440) Weight:  [90.8 kg] 90.8 kg (06/29 1123)    Intake/Output Summary (Last 24 hours) at 10/02/2021 0907 Last data filed at 10/02/2021 0545 Gross per 24 hour  Intake 1394.16 ml  Output 2400 ml  Net -1005.84 ml     Physical Exam:  General: Alert male in NAD Heart:  Regular rate and rhythm. No lower extremity edema Pulmonary: Normal respiratory effort Abdomen: Soft, nondistended, nontender. Normal bowel sounds.  Neurologic: Alert and oriented Psych: Pleasant. Cooperative.    Intake/Output from previous day: 06/29 0701 - 06/30 0700 In: 1394.2 [I.V.:1291.5; IV Piggyback:102.7] Out: 2400 [Urine:2400] Intake/Output this shift: No intake/output data recorded.    Principal Problem:   BRBPR (bright red blood per rectum) Active Problems:   HTN (hypertension)   Atrial fibrillation with RVR ([place])     LOS: 0 days   Tye Savoy ,NP 10/02/2021, 9:07 AM  GI attending  Interval history data reviewed.  Patient seen and examined.  Agree with interval progress note.  No further bleeding.  Hemoglobin stable.  Okay for discharge.  Up-to-date with colonoscopy as recent as 2019.  We are available as  needed.  We will sign off.  Docia Chuck. Geri Seminole., M.D. [facility] Division of Gastroenterology

## 2021-10-03 LAB — HEMOGLOBIN A1C
Hgb A1c MFr Bld: 5.8 % — ABNORMAL HIGH (ref 4.8–5.6)
Mean Plasma Glucose: 120 mg/dL

## 2021-10-07 NOTE — Progress Notes (Deleted)
    SUBJECTIVE:   CHIEF COMPLAINT / HPI: Hospital follow-up  Patient was evaluated in the emergency department on 10/01/2021 He presented with bright red blood per rectum in the absence of abdominal pain Patient had abdominal/pelvic CT that showed diffuse diverticulosis without diverticulitis and no other abnormalities to account for his GI bleeding While being evaluated in the ED, patient developed A-fib with RVR Today he states***  PERTINENT  PMH / PSH:  Atrial fibrillation with RVR Hypertension GERD Hyperlipidemia   OBJECTIVE:   There were no vitals taken for this visit.  Physical Exam   ASSESSMENT/PLAN:   No problem-specific Assessment & Plan notes found for this encounter.     Eulis Foster, MD [place]

## 2021-10-08 ENCOUNTER — Inpatient Hospital Stay: Payer: Medicare Other

## 2021-10-11 ENCOUNTER — Other Ambulatory Visit: Payer: Self-pay

## 2021-10-11 ENCOUNTER — Emergency Department (HOSPITAL_COMMUNITY)
Admission: EM | Admit: 2021-10-11 | Discharge: 2021-10-11 | Disposition: A | Discharge status: Home / Self Care | Payer: Medicare Other | Attending: Emergency Medicine | Admitting: Emergency Medicine

## 2021-10-11 DIAGNOSIS — R3589 Other polyuria: Secondary | ICD-10-CM | POA: Diagnosis not present

## 2021-10-11 DIAGNOSIS — I10 Essential (primary) hypertension: Secondary | ICD-10-CM | POA: Diagnosis not present

## 2021-10-11 DIAGNOSIS — R631 Polydipsia: Secondary | ICD-10-CM | POA: Diagnosis not present

## 2021-10-11 DIAGNOSIS — R531 Weakness: Secondary | ICD-10-CM | POA: Diagnosis not present

## 2021-10-11 LAB — COMPREHENSIVE METABOLIC PANEL
ALT: 14 U/L (ref 0–44)
AST: 17 U/L (ref 15–41)
Albumin: 4 g/dL (ref 3.5–5.0)
Alkaline Phosphatase: 103 U/L (ref 38–126)
Anion gap: 8 (ref 5–15)
BUN: 9 mg/dL (ref 8–23)
CO2: 27 mmol/L (ref 22–32)
Calcium: 9.1 mg/dL (ref 8.9–10.3)
Chloride: 106 mmol/L (ref 98–111)
Creatinine, Ser: 0.97 mg/dL (ref 0.61–1.24)
GFR, Estimated: >60 mL/min (ref >60)
Glucose, Bld: 118 mg/dL — ABNORMAL HIGH (ref 70–99)
Potassium: 3.3 mmol/L — ABNORMAL LOW (ref 3.5–5.1)
Sodium: 141 mmol/L (ref 135–145)
Total Bilirubin: 1.2 mg/dL (ref 0.3–1.2)
Total Protein: 7.5 g/dL (ref 6.5–8.1)

## 2021-10-11 LAB — CBC WITH DIFFERENTIAL/PLATELET
Abs Immature Granulocytes: 0.06 10*3/uL (ref 0.00–0.07)
Basophils Absolute: 0 10*3/uL (ref 0.0–0.1)
Basophils Relative: 0 %
Eosinophils Absolute: 0 10*3/uL (ref 0.0–0.5)
Eosinophils Relative: 0 %
HCT: 35.8 % — ABNORMAL LOW (ref 39.0–52.0)
Hemoglobin: 11.6 g/dL — ABNORMAL LOW (ref 13.0–17.0)
Immature Granulocytes: 1 %
Lymphocytes Relative: 9 %
Lymphs Abs: 0.9 10*3/uL (ref 0.7–4.0)
MCH: 29.3 pg (ref 26.0–34.0)
MCHC: 32.4 g/dL (ref 30.0–36.0)
MCV: 90.4 fL (ref 80.0–100.0)
Monocytes Absolute: 0.6 10*3/uL (ref 0.1–1.0)
Monocytes Relative: 6 %
Neutro Abs: 8.6 10*3/uL — ABNORMAL HIGH (ref 1.7–7.7)
Neutrophils Relative %: 84 %
Platelets: 369 10*3/uL (ref 150–400)
RBC: 3.96 MIL/uL — ABNORMAL LOW (ref 4.22–5.81)
RDW: 14 % (ref 11.5–15.5)
WBC: 10.2 10*3/uL (ref 4.0–10.5)
nRBC: 0 % (ref 0.0–0.2)

## 2021-10-11 LAB — PHOSPHORUS: Phosphorus: 2.5 mg/dL (ref 2.5–4.6)

## 2021-10-11 LAB — MAGNESIUM: Magnesium: 2.2 mg/dL (ref 1.7–2.4)

## 2021-10-11 MED ORDER — LACTATED RINGERS IV BOLUS
1000.0000 mL | Freq: Once | INTRAVENOUS | Status: AC
  Administered 2021-10-11: 1000 mL via INTRAVENOUS

## 2021-10-11 MED ORDER — CARVEDILOL 3.125 MG PO TABS
6.2500 mg | ORAL_TABLET | Freq: Once | ORAL | Status: AC
Start: 2021-10-11 — End: 2021-10-11
  Administered 2021-10-11: 6.25 mg via ORAL
  Filled 2021-10-11: qty 2

## 2021-10-11 NOTE — ED Provider Notes (Signed)
[facility] DEPT Provider Note   CSN: [id number] Arrival date & time: 10/11/21  1753     History  Chief Complaint  Patient presents with   Weakness    Steven Simmons is a 81 y.o. male.  Patient is a 81 year old male with a history of PUD, hypertension, TIA, recent hospitalization at the end of June 2 to GI bleeding which was thought to be possibly diverticular who is presenting today reporting that he was concerned that he was getting too weak and getting hypoglycemic.  Patient lives alone and reports that he often calls door Dash to bring him meals but his his phone has been acting up and for the last 2 days he has not been able to call door Dash to bring him anything to eat.  He reports he has no family or people who help him.  He reports the friends he has are getting tired of him.  Today he was just feeling very tired and generally weak with increased thirst and polyuria.  He was concerned that his blood sugar was too low and he called 911 because he felt that he needed some food and fluids.  He denies any chest pain, shortness of breath, abdominal pain.  He does not drive but currently does have a cab driver he will call who takes him around places.  He does report that he has been out of his blood pressure medicine since he left the hospital.  Appears that they changed it but he has not picked it up from the pharmacy.  He denies any vomiting or diarrhea.  The history is provided by the patient and medical records.  Weakness      Home Medications Prior to Admission medications   Medication Sig Start Date End Date Taking? Authorizing Provider  carvedilol (COREG) 6.25 MG tablet Take 1 tablet (6.25 mg total) by mouth 2 (two) times daily. 10/02/21 11/01/21  Alcus Dad, MD  ranitidine (ZANTAC) 150 MG tablet Take 1 tablet (150 mg total) by mouth at bedtime. Patient not taking: Reported on 12/09/2018 09/02/17 12/09/18  Willia Craze, NP      Allergies     Ceclor [cefaclor]; Veralipride; Penicillins; Amlodipine; Antihistamines, chlorpheniramine-type; Beta adrenergic blockers; Celebrex [celecoxib]; Ibuprofen; Lipitor [atorvastatin]; and Nutrasweet aspartame [aspartame]    Review of Systems   Review of Systems  Neurological:  Positive for weakness.    Physical Exam Updated Vital Signs BP (!) 187/94   Pulse 84   Temp 98 F (36.7 C)   Resp 16   Ht '5\' 10"'$  (1.778 m)   Wt 90.7 kg   SpO2 96%   BMI 28.70 kg/m  Physical Exam Vitals and nursing note reviewed.  Constitutional:      General: He is not in acute distress.    Appearance: He is well-developed.     Comments: Patient is dirty, poorly groomed and mildly disheveled  HENT:     Head: Normocephalic and atraumatic.     Mouth/Throat:     Mouth: Mucous membranes are dry.  Eyes:     Conjunctiva/sclera: Conjunctivae normal.     Pupils: Pupils are equal, round, and reactive to light.  Cardiovascular:     Rate and Rhythm: Normal rate and regular rhythm.     Heart sounds: No murmur heard. Pulmonary:     Effort: Pulmonary effort is normal. No respiratory distress.     Breath sounds: Normal breath sounds. No wheezing or rales.  Abdominal:     General:  There is no distension.     Palpations: Abdomen is soft.     Tenderness: There is no abdominal tenderness. There is no guarding or rebound.  Musculoskeletal:        General: No tenderness. Normal range of motion.     Cervical back: Normal range of motion and neck supple.     Right lower leg: No edema.     Left lower leg: No edema.  Skin:    General: Skin is warm and dry.     Findings: No erythema or rash.  Neurological:     Mental Status: He is alert and oriented to person, place, and time. Mental status is at baseline.     Sensory: No sensory deficit.     Motor: No weakness.  Psychiatric:        Mood and Affect: Mood normal.        Behavior: Behavior normal.     ED Results / Procedures / Treatments   Labs (all labs ordered  are listed, but only abnormal results are displayed) Labs Reviewed  CBC WITH DIFFERENTIAL/PLATELET - Abnormal; Notable for the following components:      Result Value   RBC 3.96 (*)    Hemoglobin 11.6 (*)    HCT 35.8 (*)    Neutro Abs 8.6 (*)    All other components within normal limits  COMPREHENSIVE METABOLIC PANEL - Abnormal; Notable for the following components:   Potassium 3.3 (*)    Glucose, Bld 118 (*)    All other components within normal limits  PHOSPHORUS  MAGNESIUM    EKG None  Radiology No results found.  Procedures Procedures    Medications Ordered in ED Medications  lactated ringers bolus 1,000 mL (1,000 mLs Intravenous New Bag/Given 10/11/21 1859)  carvedilol (COREG) tablet 6.25 mg (6.25 mg Oral Given 10/11/21 1907)    ED Course/ Medical Decision Making/ A&P                           Medical Decision Making Amount and/or Complexity of Data Reviewed Independent Historian: EMS External Data Reviewed: notes.    Details: Recent hospitalization Labs: ordered. Decision-making details documented in ED Course.  Risk Prescription drug management.   Pt with multiple medical problems and comorbidities and presenting today with a complaint that caries a high risk for morbidity and mortality.  Presenting today with complaint of weakness.  Patient is awake and alert and appears to be normal mental status.  It does appear that patient does not have a great social situation but is still able to call people, get to where he needs to go and was having people bring food by door Dash.  He has not had any falls, denies any head injuries.  Exam is relatively benign except for patient being disheveled.  He is noted to be hypertensive today and does report that he has not taken any blood pressure medication since leaving the hospital.  He is not on any anticoagulation.  Patient reports that in the past he has had electrolyte abnormalities and been hypoglycemic.  However the  complaints he has are more concerning for hyperglycemia.  Patient given fluids, something to eat and we will check labs and electrolytes to assure no acute abnormality.  Patient also given a dose of carvedilol which was recommended on discharge for his blood pressure. 8:42 PM I independently interpreted patient's labs today and CBC, CMP without acute findings other than potassium of  3.3, phosphorus and magnesium are within normal limits.  Patient has been taken off HCTZ and lisinopril.  Discussed the findings with the patient.  He ate and has been hemodynamically stable here.  He was given a dose of his carvedilol and blood pressure is starting to trend down.  Do not feel that patient meets any criteria for admission at this time and is stable for discharge.         Final Clinical Impression(s) / ED Diagnoses Final diagnoses:  Weakness    Rx / DC Orders ED Discharge Orders     None         Blanchie Dessert, MD 10/11/21 2042

## 2021-10-11 NOTE — Discharge Instructions (Addendum)
Your labs today look good.  Continue to stay hydrated and make sure you are eating regularly.  Also you need to go to the pharmacy to get your blood pressure medicine.

## 2021-10-11 NOTE — ED Triage Notes (Addendum)
Patient BIB home  c/o weakness. Patient report unable to eat for 2-3days now and unable to take his regular medicine.  Pt report he got no transportation to get him food. Patient a/o x 4. Patient denies N/V/D.   BP 192/94 HR 100 RR 20 O2sat 98%

## 2021-10-11 NOTE — ED Notes (Signed)
Pt reports not having a ride upon discharge. Pt is ambulatory, and does not meet requirements for PTAR.

## 2021-12-02 ENCOUNTER — Emergency Department (HOSPITAL_COMMUNITY): Payer: Medicare Other

## 2021-12-02 ENCOUNTER — Other Ambulatory Visit: Payer: Self-pay

## 2021-12-02 ENCOUNTER — Observation Stay (HOSPITAL_COMMUNITY)
Admission: EM | Admit: 2021-12-02 | Discharge: 2021-12-03 | Disposition: A | Discharge status: Home / Self Care | Payer: Medicare Other | Attending: Internal Medicine | Admitting: Internal Medicine

## 2021-12-02 DIAGNOSIS — R000 Tachycardia, unspecified: Secondary | ICD-10-CM | POA: Diagnosis not present

## 2021-12-02 DIAGNOSIS — R0789 Other chest pain: Secondary | ICD-10-CM | POA: Diagnosis not present

## 2021-12-02 DIAGNOSIS — Z87891 Personal history of nicotine dependence: Secondary | ICD-10-CM | POA: Diagnosis not present

## 2021-12-02 DIAGNOSIS — R0609 Other forms of dyspnea: Principal | ICD-10-CM | POA: Insufficient documentation

## 2021-12-02 DIAGNOSIS — J189 Pneumonia, unspecified organism: Secondary | ICD-10-CM | POA: Diagnosis not present

## 2021-12-02 DIAGNOSIS — R079 Chest pain, unspecified: Secondary | ICD-10-CM | POA: Diagnosis not present

## 2021-12-02 DIAGNOSIS — R0602 Shortness of breath: Secondary | ICD-10-CM | POA: Diagnosis not present

## 2021-12-02 DIAGNOSIS — D649 Anemia, unspecified: Secondary | ICD-10-CM | POA: Diagnosis not present

## 2021-12-02 DIAGNOSIS — Z20822 Contact with and (suspected) exposure to covid-19: Secondary | ICD-10-CM | POA: Diagnosis not present

## 2021-12-02 DIAGNOSIS — E876 Hypokalemia: Secondary | ICD-10-CM | POA: Insufficient documentation

## 2021-12-02 DIAGNOSIS — I480 Paroxysmal atrial fibrillation: Secondary | ICD-10-CM

## 2021-12-02 DIAGNOSIS — Z79899 Other long term (current) drug therapy: Secondary | ICD-10-CM | POA: Diagnosis not present

## 2021-12-02 DIAGNOSIS — R2243 Localized swelling, mass and lump, lower limb, bilateral: Secondary | ICD-10-CM | POA: Diagnosis not present

## 2021-12-02 DIAGNOSIS — R002 Palpitations: Secondary | ICD-10-CM | POA: Diagnosis present

## 2021-12-02 DIAGNOSIS — Z8673 Personal history of transient ischemic attack (TIA), and cerebral infarction without residual deficits: Secondary | ICD-10-CM | POA: Insufficient documentation

## 2021-12-02 DIAGNOSIS — R5383 Other fatigue: Secondary | ICD-10-CM | POA: Diagnosis not present

## 2021-12-02 DIAGNOSIS — I4891 Unspecified atrial fibrillation: Secondary | ICD-10-CM | POA: Diagnosis not present

## 2021-12-02 DIAGNOSIS — R778 Other specified abnormalities of plasma proteins: Secondary | ICD-10-CM | POA: Insufficient documentation

## 2021-12-02 DIAGNOSIS — I10 Essential (primary) hypertension: Secondary | ICD-10-CM | POA: Diagnosis not present

## 2021-12-02 DIAGNOSIS — R918 Other nonspecific abnormal finding of lung field: Secondary | ICD-10-CM | POA: Diagnosis not present

## 2021-12-02 DIAGNOSIS — R7989 Other specified abnormal findings of blood chemistry: Secondary | ICD-10-CM

## 2021-12-02 DIAGNOSIS — J90 Pleural effusion, not elsewhere classified: Secondary | ICD-10-CM | POA: Diagnosis not present

## 2021-12-02 DIAGNOSIS — R0600 Dyspnea, unspecified: Secondary | ICD-10-CM | POA: Diagnosis present

## 2021-12-02 LAB — BASIC METABOLIC PANEL
Anion gap: 10 (ref 5–15)
BUN: 14 mg/dL (ref 8–23)
CO2: 23 mmol/L (ref 22–32)
Calcium: 8.2 mg/dL — ABNORMAL LOW (ref 8.9–10.3)
Chloride: 106 mmol/L (ref 98–111)
Creatinine, Ser: 0.93 mg/dL (ref 0.61–1.24)
GFR, Estimated: >60 mL/min (ref >60)
Glucose, Bld: 128 mg/dL — ABNORMAL HIGH (ref 70–99)
Potassium: 2.9 mmol/L — ABNORMAL LOW (ref 3.5–5.1)
Sodium: 139 mmol/L (ref 135–145)

## 2021-12-02 LAB — CBC WITH DIFFERENTIAL/PLATELET
Abs Immature Granulocytes: 0.05 10*3/uL (ref 0.00–0.07)
Basophils Absolute: 0 10*3/uL (ref 0.0–0.1)
Basophils Relative: 1 %
Eosinophils Absolute: 0.1 10*3/uL (ref 0.0–0.5)
Eosinophils Relative: 1 %
HCT: 35.8 % — ABNORMAL LOW (ref 39.0–52.0)
Hemoglobin: 11.4 g/dL — ABNORMAL LOW (ref 13.0–17.0)
Immature Granulocytes: 1 %
Lymphocytes Relative: 8 %
Lymphs Abs: 0.7 10*3/uL (ref 0.7–4.0)
MCH: 28.9 pg (ref 26.0–34.0)
MCHC: 31.8 g/dL (ref 30.0–36.0)
MCV: 90.6 fL (ref 80.0–100.0)
Monocytes Absolute: 0.5 10*3/uL (ref 0.1–1.0)
Monocytes Relative: 7 %
Neutro Abs: 6.6 10*3/uL (ref 1.7–7.7)
Neutrophils Relative %: 82 %
Platelets: 380 10*3/uL (ref 150–400)
RBC: 3.95 MIL/uL — ABNORMAL LOW (ref 4.22–5.81)
RDW: 13.7 % (ref 11.5–15.5)
WBC: 8 10*3/uL (ref 4.0–10.5)
nRBC: 0 % (ref 0.0–0.2)

## 2021-12-02 LAB — CBC
HCT: 38.4 % — ABNORMAL LOW (ref 39.0–52.0)
Hemoglobin: 12.5 g/dL — ABNORMAL LOW (ref 13.0–17.0)
MCH: 29.1 pg (ref 26.0–34.0)
MCHC: 32.6 g/dL (ref 30.0–36.0)
MCV: 89.5 fL (ref 80.0–100.0)
Platelets: 407 10*3/uL — ABNORMAL HIGH (ref 150–400)
RBC: 4.29 MIL/uL (ref 4.22–5.81)
RDW: 13.7 % (ref 11.5–15.5)
WBC: 9.3 10*3/uL (ref 4.0–10.5)
nRBC: 0 % (ref 0.0–0.2)

## 2021-12-02 LAB — TROPONIN I (HIGH SENSITIVITY)
Troponin I (High Sensitivity): 24 ng/L — ABNORMAL HIGH (ref <18)
Troponin I (High Sensitivity): 23 ng/L — ABNORMAL HIGH (ref <18)

## 2021-12-02 LAB — MAGNESIUM: Magnesium: 1.9 mg/dL (ref 1.7–2.4)

## 2021-12-02 LAB — IRON AND TIBC
Iron: 36 ug/dL — ABNORMAL LOW (ref 45–182)
Saturation Ratios: 14 % — ABNORMAL LOW (ref 17.9–39.5)
TIBC: 256 ug/dL (ref 250–450)
UIBC: 220 ug/dL

## 2021-12-02 LAB — RESP PANEL BY RT-PCR (FLU A&B, COVID) ARPGX2
Influenza A by PCR: NEGATIVE
Influenza B by PCR: NEGATIVE
SARS Coronavirus 2 by RT PCR: NEGATIVE

## 2021-12-02 LAB — PROCALCITONIN: Procalcitonin: <0.1 ng/mL

## 2021-12-02 MED ORDER — ONDANSETRON HCL 4 MG/2ML IJ SOLN: 4.0000 mg | Freq: Four times a day (QID) | INTRAMUSCULAR | Status: DC | PRN

## 2021-12-02 MED ORDER — POTASSIUM CHLORIDE CRYS ER 20 MEQ PO TBCR
40.0000 meq | EXTENDED_RELEASE_TABLET | Freq: Two times a day (BID) | ORAL | Status: DC
  Administered 2021-12-02 – 2021-12-03 (×2): 40 meq via ORAL
  Filled 2021-12-02 (×2): qty 2

## 2021-12-02 MED ORDER — DOXYCYCLINE HYCLATE 100 MG PO TABS
100.0000 mg | ORAL_TABLET | Freq: Two times a day (BID) | ORAL | Status: DC
  Administered 2021-12-02 – 2021-12-03 (×2): 100 mg via ORAL
  Filled 2021-12-02 (×2): qty 1

## 2021-12-02 MED ORDER — ENOXAPARIN SODIUM 40 MG/0.4ML IJ SOSY
40.0000 mg | PREFILLED_SYRINGE | INTRAMUSCULAR | Status: DC
  Administered 2021-12-02: 40 mg via SUBCUTANEOUS
  Filled 2021-12-02: qty 0.4

## 2021-12-02 MED ORDER — ACETAMINOPHEN 325 MG PO TABS: 650.0000 mg | ORAL_TABLET | ORAL | Status: DC | PRN

## 2021-12-02 MED ORDER — PANTOPRAZOLE SODIUM 40 MG PO TBEC
40.0000 mg | DELAYED_RELEASE_TABLET | Freq: Every day | ORAL | Status: DC
  Administered 2021-12-03: 40 mg via ORAL
  Filled 2021-12-02: qty 1

## 2021-12-02 MED ORDER — DILTIAZEM HCL 30 MG PO TABS
60.0000 mg | ORAL_TABLET | Freq: Three times a day (TID) | ORAL | Status: DC
  Administered 2021-12-02 – 2021-12-03 (×3): 60 mg via ORAL
  Filled 2021-12-02 (×3): qty 2

## 2021-12-02 MED ORDER — POTASSIUM CHLORIDE CRYS ER 20 MEQ PO TBCR
60.0000 meq | EXTENDED_RELEASE_TABLET | Freq: Once | ORAL | Status: AC
  Administered 2021-12-02: 60 meq via ORAL
  Filled 2021-12-02: qty 3

## 2021-12-02 NOTE — ED Notes (Signed)
IP RN notified of chart report.

## 2021-12-02 NOTE — H&P (Addendum)
History and Physical    Patient: Steven Simmons NGE:[id number] DOB: 17-Oct-1940 DOA: 12/02/2021 DOS: the patient was seen and examined on 12/02/2021 PCP: [name], MD  Patient coming from: Home  Chief Complaint:  Chief Complaint  Patient presents with   DOE   HPI: Steven Simmons is a 81 y.o. male with medical history significant of a fib, HTN, GERD. Presenting w/ DOE. He was in his normal state of health until last night. He noticed that when he would get up to go to the bathroom, his heart would race and he would feel short of breath. There was no chest pain, fever, or cough. He didn't have any sick contacts. He didn't try any medicines to help. He decided to sleep things off. When he woke this morning, his symptoms seemed worse. So, he decided to come to the ED for assistance. He denies any other aggravating or alleviating factors.   Review of Systems: As mentioned in the history of present illness. All other systems reviewed and are negative. Past Medical History:  Diagnosis Date   Allergy    Arthritis    shoulders (08/23/2017)   Aspiration pneumonia ([place]) 08/23/2017   Bleeding stomach ulcer 1988   Cataract    Complication of anesthesia 1993   " dont' know the name of it but they gave it to me at [facility] in 1993 "   I was cold, shaking and had a headache for hours afterwards "   GERD (gastroesophageal reflux disease)    Heart murmur    "when I was a child"   History of blood transfusion 1988   "bleeding ulcer"     History of TIA (transient ischemic attack) 07/2015   pt denies this hx on 08/23/2017   Hypertension    Migraine 1959-1980   "did have 4 ~ 2 wks ago; just the aura then" (08/23/2017)   Peptic ulcer disease    Sepsis secondary to UTI ([place]) 08/22/2017   Past Surgical History:  Procedure Laterality Date   CATARACT EXTRACTION W/ INTRAOCULAR LENS  IMPLANT, BILATERAL Bilateral    COLONOSCOPY  2012 ?   EXCISIONAL HEMORRHOIDECTOMY  1993   LAPAROSCOPIC  CHOLECYSTECTOMY     Social History:  reports that he quit smoking about 44 years ago. His smoking use included cigarettes and cigars. He has never used smokeless tobacco. He reports that he does not drink alcohol and does not use drugs.  Allergies  Allergen Reactions   Ceclor [Cefaclor] Other (See Comments)    Makes my heart slow and I feel like I am fading away   Veralipride     Gums bleed, tooth aches   Penicillins Other (See Comments)     Has patient had a PCN reaction causing immediate rash, facial/tongue/throat swelling, SOB or lightheadedness with hypotension: yes- only rash Has patient had a PCN reaction causing severe rash involving mucus membranes or skin necrosis: No Has patient had a PCN reaction that required hospitalization: No Has patient had a PCN reaction occurring within the last 10 years: No If all of the above answers are "NO", then may proceed with Cephalosporin use.   Amlodipine Other (See Comments)    Gum bleeding   Antihistamines, Chlorpheniramine-Type Other (See Comments)    Dizziness and nose bleeds   Beta Adrenergic Blockers Other (See Comments)    Gums bleed   Celebrex [Celecoxib] Other (See Comments)    Stomach pain   Ibuprofen Other (See Comments)    Stomach pain  Lipitor [Atorvastatin] Hypertension   Nutrasweet Aspartame [Aspartame] Other (See Comments)    Memory loss    Family History  Problem Relation Age of Onset   Heart disease Mother    Hypertension Mother    Heart disease Father    Hypertension Father    Colon cancer Father    Esophageal cancer Neg Hx    Rectal cancer Neg Hx    Stomach cancer Neg Hx     Prior to Admission medications   Medication Sig Start Date End Date Taking? Authorizing Provider  acetaminophen (TYLENOL) 500 MG tablet Take 1,000 mg by mouth every 6 (six) hours as needed for mild pain.   Yes [provider]  Potassium 99 MG TABS Take 297 mg by mouth daily. 3 tabs daily   Yes [provider]   carvedilol (COREG) 6.25 MG tablet Take 1 tablet (6.25 mg total) by mouth 2 (two) times daily. 10/02/21 11/01/21  Alcus Dad, MD  ranitidine (ZANTAC) 150 MG tablet Take 1 tablet (150 mg total) by mouth at bedtime. Patient not taking: Reported on 12/09/2018 09/02/17 12/09/18  Willia Craze, NP    Physical Exam: Vitals:   12/02/21 1300 12/02/21 1420 12/02/21 1433 12/02/21 1433  BP: (!) 164/114   (!) 157/120  Pulse: 64 88  76  Resp: '20 16  16  '$ Temp: 98.8 F (37.1 C)  98.6 F (37 C)   TempSrc:      SpO2: 96% 96%  99%  Weight: 104.3 kg   104.3 kg  Height: '5\' 10"'$  (1.778 m)   '5\' 11"'$  (1.803 m)   General: 81 y.o. male resting in bed in NAD Eyes: PERRL, normal sclera ENMT: Nares patent w/o discharge, orophaynx clear, dentition normal, ears w/o discharge/lesions/ulcers Neck: Supple, trachea midline Cardiovascular: tachy irregular, +S1, S2, no m/g/r, equal pulses throughout Respiratory: CTABL, no w/r/r, normal WOB GI: BS+, NDNT, no masses noted, no organomegaly noted MSK: No e/c/c Neuro: A&O x 3, no focal deficits Psyc: Appropriate interaction and affect, calm/cooperative  Data Reviewed:  Lab Results  Component Value Date   NA 139 12/02/2021   K 2.9 (L) 12/02/2021   CO2 23 12/02/2021   GLUCOSE 128 (H) 12/02/2021   BUN 14 12/02/2021   CREATININE 0.93 12/02/2021   CALCIUM 8.2 (L) 12/02/2021   GFRNONAA >60 12/02/2021   Lab Results  Component Value Date   WBC 8.0 12/02/2021   HGB 11.4 (L) 12/02/2021   HCT 35.8 (L) 12/02/2021   MCV 90.6 12/02/2021   PLT 380 12/02/2021   CXR: Subtle basilar opacity RIGHT greater than LEFT may represent atelectasis. Difficult to exclude developing airspace process at the RIGHT lung base.  EKG: a fib, no st elevations  Assessment and Plan: A fib DOE Elevated troponin     - place in obs, tele     - trp 24 -> 23; no chest pain, EKG as above     - check echo     - was supposed to be on coreg, but he is non-compliant; chart notes gum  bleeding with beta-blocker; states he's never tried diltiazem before, will start for a fib and BP  RLL PNA     - CXR w/ questionable RLL PNA     - start CAP coverage (w/ doxy: PCN/cef allergy) and check procal, if negative, can d/c CAP coverage  HTN     - diltiazem; monitor  Hypokalemia     - replace K+;Mg2+ ok  Normocytic anemia     -  check iron studies     - no evidence of bleed  Advance Care Planning:   Code Status: FULL  Consults: None  Family Communication: None at bedside  Severity of Illness: The appropriate patient status for this patient is OBSERVATION. Observation status is judged to be reasonable and necessary in order to provide the required intensity of service to ensure the patient's safety. The patient's presenting symptoms, physical exam findings, and initial radiographic and laboratory data in the context of their medical condition is felt to place them at decreased risk for further clinical deterioration. Furthermore, it is anticipated that the patient will be medically stable for discharge from the hospital within 2 midnights of admission.   Time spent in coordination of this H&P: 52 minutes  Author: Jonnie Finner, DO 12/02/2021 3:08 PM  For on call review [email or web address].

## 2021-12-02 NOTE — ED Provider Notes (Signed)
[facility] DEPT Provider Note   CSN: [id number] Arrival date & time: 12/02/21  1101     History  Chief Complaint  Patient presents with   Irregular Heart Beat    Steven Simmons is a 81 y.o. male.  81 year old male with a history of housing instability, hypertension, atrial fibrillation not on anticoagulation because of a GI bleed (thought to be diverticular) who presents emergency department with palpitations.  Patient states that earlier today he was walking to the restroom and felt palpitations as well as shortness of breath.  Says that his symptoms spontaneously resolved and that he is feeling much better now.  Denied any chest pain.  No leg swelling or history of DVT/PE per the patient.  Did have an evaluation for possible GI bleed in May that was thought to be due to possible diverticulosis.  Denies any additional blood in the stool. Has been taking his Coreg regularly.  Also has had mild runny nose but no cough.          Home Medications Prior to Admission medications   Medication Sig Start Date End Date Taking? Authorizing Provider  acetaminophen (TYLENOL) 500 MG tablet Take 1,000 mg by mouth every 6 (six) hours as needed for mild pain.   Yes [provider]  Potassium 99 MG TABS Take 297 mg by mouth daily. 3 tabs daily   Yes [provider]  carvedilol (COREG) 6.25 MG tablet Take 1 tablet (6.25 mg total) by mouth 2 (two) times daily. 10/02/21 11/01/21  Alcus Dad, MD  ranitidine (ZANTAC) 150 MG tablet Take 1 tablet (150 mg total) by mouth at bedtime. Patient not taking: Reported on 12/09/2018 09/02/17 12/09/18  Willia Craze, NP      Allergies    Ceclor [cefaclor]; Veralipride; Penicillins; Amlodipine; Antihistamines, chlorpheniramine-type; Beta adrenergic blockers; Celebrex [celecoxib]; Ibuprofen; Lipitor [atorvastatin]; and Nutrasweet aspartame [aspartame]    Review of Systems   Review of Systems  Physical  Exam Updated Vital Signs BP (!) 146/84 (BP Location: Left Arm)   Pulse 81   Temp 98.7 F (37.1 C) (Oral)   Resp 18   Ht '5\' 11"'$  (1.803 m)   Wt 104.3 kg   SpO2 96%   BMI 32.08 kg/m  Physical Exam Vitals and nursing note reviewed.  Constitutional:      General: He is not in acute distress.    Appearance: He is well-developed.  HENT:     Head: Normocephalic and atraumatic.     Right Ear: External ear normal.     Left Ear: External ear normal.     Nose: Nose normal.  Eyes:     Extraocular Movements: Extraocular movements intact.     Conjunctiva/sclera: Conjunctivae normal.     Pupils: Pupils are equal, round, and reactive to light.  Cardiovascular:     Rate and Rhythm: Normal rate. Rhythm irregular.     Heart sounds: Normal heart sounds.  Pulmonary:     Effort: Pulmonary effort is normal. No respiratory distress.     Breath sounds: Normal breath sounds.  Abdominal:     General: There is no distension.  Musculoskeletal:        General: No swelling.     Cervical back: Normal range of motion and neck supple.     Right lower leg: Edema (Trace) present.     Left lower leg: Edema (Trace) present.  Skin:    General: Skin is warm and dry.     Capillary Refill: Capillary  refill takes less than 2 seconds.  Neurological:     Mental Status: He is alert. Mental status is at baseline.  Psychiatric:        Mood and Affect: Mood normal.        Behavior: Behavior normal.     ED Results / Procedures / Treatments   Labs (all labs ordered are listed, but only abnormal results are displayed) Labs Reviewed  CBC WITH DIFFERENTIAL/PLATELET - Abnormal; Notable for the following components:      Result Value   RBC 3.95 (*)    Hemoglobin 11.4 (*)    HCT 35.8 (*)    All other components within normal limits  BASIC METABOLIC PANEL - Abnormal; Notable for the following components:   Potassium 2.9 (*)    Glucose, Bld 128 (*)    Calcium 8.2 (*)    All other components within normal limits   TROPONIN I (HIGH SENSITIVITY) - Abnormal; Notable for the following components:   Troponin I (High Sensitivity) 24 (*)    All other components within normal limits  TROPONIN I (HIGH SENSITIVITY) - Abnormal; Notable for the following components:   Troponin I (High Sensitivity) 23 (*)    All other components within normal limits  RESP PANEL BY RT-PCR (FLU A&B, COVID) ARPGX2  MAGNESIUM  CBC  CREATININE, SERUM  LIPID PANEL  PROCALCITONIN  IRON AND TIBC    EKG EKG Interpretation  Date/Time:  Wednesday December 02 2021 12:52:29 EDT Ventricular Rate:  85 PR Interval:    QRS Duration: 94 QT Interval:  400 QTC Calculation: 476 R Axis:   15 Text Interpretation: Atrial fibrillation Borderline ST depression, anterolateral leads Borderline prolonged QT interval Confirmed by Margaretmary Eddy [phone]) on 12/02/2021 2:15:23 PM  Radiology DG Chest Port 1 View  Result Date: 12/02/2021 CLINICAL DATA:  w 81 year old male presenting for evaluation of shortness of breath. EXAM: PORTABLE CHEST 1 VIEW COMPARISON:  Aug 22, 2017. FINDINGS: EKG leads project over the chest. Trachea midline. Cardiomediastinal contours and hilar structures are stable. Subtle basilar opacity RIGHT greater than LEFT mainly linear. No lobar consolidation. No sign of pleural effusion or pneumothorax. On limited assessment no acute skeletal findings. IMPRESSION: Subtle basilar opacity RIGHT greater than LEFT may represent atelectasis. Difficult to exclude developing airspace process at the RIGHT lung base. Electronically Signed   By: Zetta Bills M.D.   On: 12/02/2021 12:01    Procedures Procedures   Medications Ordered in ED Medications  diltiazem (CARDIZEM) tablet 60 mg (60 mg Oral Given 12/02/21 1541)  acetaminophen (TYLENOL) tablet 650 mg (has no administration in time range)  ondansetron (ZOFRAN) injection 4 mg (has no administration in time range)  enoxaparin (LOVENOX) injection 40 mg (has no administration in time range)   pantoprazole (PROTONIX) EC tablet 40 mg (has no administration in time range)  doxycycline (VIBRA-TABS) tablet 100 mg (has no administration in time range)  potassium chloride SA (KLOR-CON M) CR tablet 40 mEq (has no administration in time range)  potassium chloride SA (KLOR-CON M) CR tablet 60 mEq (60 mEq Oral Given 12/02/21 1314)    ED Course/ Medical Decision Making/ A&P Clinical Course as of 12/02/21 1935  Wed Dec 02, 2021  1416 Troponin I (High Sensitivity)(!): 24 No recent for comparison. Last in 2019 reported were negative.  [RP]  5170 Discussed with medicine for admission.  Dr. Marylyn Ishihara will evaluate the patient shortly. [RP]    Clinical Course User Index [RP] Fransico Meadow, MD  Medical Decision Making Amount and/or Complexity of Data Reviewed Labs: ordered. Decision-making details documented in ED Course. Radiology: ordered.  Risk Prescription drug management. Decision regarding hospitalization.   81 year old male with a history of housing instability, hypertension, atrial fibrillation not on anticoagulation because of a GI bleed (thought to be diverticular) who presents emergency department with palpitations.   Initial Ddx:  Atrial fibrillation with RVR, arrhythmia, pulmonary embolism, MI, electrolyte abnormalities  MDM:  Initially felt that the patient's symptoms were likely due to an arrhythmia or atrial fibrillation with RVR given the fact that it spontaneously resolved.  Did consider pulmonary embolism and MI but feel that these are less likely given the fact that his symptoms resolved at this time.  Will check for electrolyte abnormalities as well that could be contributing to any arrhythmia.  Plan:  Labs Troponin Electrolytes EKG Chest x-ray  ED Summary:  Patient underwent the above work-up which revealed hypokalemia.  EKG shows atrial fibrillation and his troponin was noted to be elevated from prior.  Patient also noted to have  worsening renal function which could be the cause of his elevated troponin but given the degree of elevation in his symptoms admitted to medicine for further management.  Patient was noted to have low potassium which was repleted in the emergency department.  He was then admitted to medicine for further management.  Dispo: Admit to Floor   Records reviewed OP Notes and Admission Notes The following labs were independently interpreted: Chemistry and Serial Troponins Social Determinants of health:  Homelessness  Final Clinical Impression(s) / ED Diagnoses Final diagnoses:  Elevated troponin  Chest pain, unspecified type    Rx / DC Orders ED Discharge Orders     None         Fransico Meadow, MD 12/02/21 Joen Laura

## 2021-12-02 NOTE — Progress Notes (Addendum)
This CSW went to speak with the pt regarding housing and needing medication assistance. The pt reported that he does not need assistance with medications as he can afford them. This CSW informed the pt that [place] will be attached to his AVS to address housing needs. This CSW informed Claiborne Billings, RN, and Dr. Philip Aspen. TOC signing off.

## 2021-12-02 NOTE — ED Triage Notes (Signed)
Pt states he was recently diagnosed with a fib. States he has been taking his medication and it makes him feel bad. States today he feels bad and weak all over. Pt denies pain.

## 2021-12-03 ENCOUNTER — Encounter (HOSPITAL_COMMUNITY): Payer: Self-pay | Admitting: Internal Medicine

## 2021-12-03 ENCOUNTER — Observation Stay (HOSPITAL_BASED_OUTPATIENT_CLINIC_OR_DEPARTMENT_OTHER): Payer: Medicare Other

## 2021-12-03 ENCOUNTER — Other Ambulatory Visit: Payer: Self-pay

## 2021-12-03 ENCOUNTER — Other Ambulatory Visit (HOSPITAL_COMMUNITY): Payer: Self-pay

## 2021-12-03 ENCOUNTER — Encounter (HOSPITAL_COMMUNITY): Payer: Self-pay | Admitting: Emergency Medicine

## 2021-12-03 ENCOUNTER — Emergency Department (HOSPITAL_COMMUNITY): Payer: Medicare Other

## 2021-12-03 ENCOUNTER — Emergency Department (HOSPITAL_COMMUNITY)
Admission: EM | Admit: 2021-12-03 | Discharge: 2021-12-04 | Disposition: A | Discharge status: Home / Self Care | Payer: Medicare Other | Source: Home / Self Care | Attending: Emergency Medicine | Admitting: Emergency Medicine

## 2021-12-03 DIAGNOSIS — I10 Essential (primary) hypertension: Secondary | ICD-10-CM | POA: Insufficient documentation

## 2021-12-03 DIAGNOSIS — I340 Nonrheumatic mitral (valve) insufficiency: Secondary | ICD-10-CM | POA: Diagnosis not present

## 2021-12-03 DIAGNOSIS — R5383 Other fatigue: Secondary | ICD-10-CM | POA: Insufficient documentation

## 2021-12-03 DIAGNOSIS — I4891 Unspecified atrial fibrillation: Secondary | ICD-10-CM | POA: Diagnosis not present

## 2021-12-03 DIAGNOSIS — J90 Pleural effusion, not elsewhere classified: Secondary | ICD-10-CM | POA: Diagnosis not present

## 2021-12-03 DIAGNOSIS — Z79899 Other long term (current) drug therapy: Secondary | ICD-10-CM | POA: Insufficient documentation

## 2021-12-03 DIAGNOSIS — R0609 Other forms of dyspnea: Secondary | ICD-10-CM | POA: Diagnosis not present

## 2021-12-03 DIAGNOSIS — R531 Weakness: Secondary | ICD-10-CM | POA: Diagnosis not present

## 2021-12-03 DIAGNOSIS — R2243 Localized swelling, mass and lump, lower limb, bilateral: Secondary | ICD-10-CM | POA: Insufficient documentation

## 2021-12-03 DIAGNOSIS — I482 Chronic atrial fibrillation, unspecified: Secondary | ICD-10-CM

## 2021-12-03 DIAGNOSIS — R0602 Shortness of breath: Secondary | ICD-10-CM | POA: Diagnosis not present

## 2021-12-03 LAB — CBC
HCT: 34.6 % — ABNORMAL LOW (ref 39.0–52.0)
Hemoglobin: 11.3 g/dL — ABNORMAL LOW (ref 13.0–17.0)
MCH: 29 pg (ref 26.0–34.0)
MCHC: 32.7 g/dL (ref 30.0–36.0)
MCV: 88.7 fL (ref 80.0–100.0)
Platelets: 443 10*3/uL — ABNORMAL HIGH (ref 150–400)
RBC: 3.9 MIL/uL — ABNORMAL LOW (ref 4.22–5.81)
RDW: 13.7 % (ref 11.5–15.5)
WBC: 9 10*3/uL (ref 4.0–10.5)
nRBC: 0 % (ref 0.0–0.2)

## 2021-12-03 LAB — CREATININE, SERUM
Creatinine, Ser: 0.93 mg/dL (ref 0.61–1.24)
GFR, Estimated: >60 mL/min (ref >60)

## 2021-12-03 LAB — ECHOCARDIOGRAM COMPLETE
AR max vel: 1.62 cm2
AV Area VTI: 1.46 cm2
AV Area mean vel: 1.48 cm2
AV Mean grad: 5.7 mmHg
AV Peak grad: 10.2 mmHg
Ao pk vel: 1.6 m/s
Area-P 1/2: 3.81 cm2
Calc EF: 59.5 %
Height: 71 in
MV M vel: 5.12 m/s
MV Peak grad: 104.9 mmHg
MV VTI: 1.55 cm2
Radius: 0.4 cm
S' Lateral: 3 cm
Single Plane A2C EF: 67.8 %
Single Plane A4C EF: 52.4 %
Weight: 3231.06 oz

## 2021-12-03 LAB — GLUCOSE, CAPILLARY: Glucose-Capillary: 136 mg/dL — ABNORMAL HIGH (ref 70–99)

## 2021-12-03 LAB — BASIC METABOLIC PANEL
Anion gap: 12 (ref 5–15)
BUN: 17 mg/dL (ref 8–23)
CO2: 24 mmol/L (ref 22–32)
Calcium: 9.2 mg/dL (ref 8.9–10.3)
Chloride: 104 mmol/L (ref 98–111)
Creatinine, Ser: 1.37 mg/dL — ABNORMAL HIGH (ref 0.61–1.24)
GFR, Estimated: 52 mL/min — ABNORMAL LOW (ref >60)
Glucose, Bld: 158 mg/dL — ABNORMAL HIGH (ref 70–99)
Potassium: 3.6 mmol/L (ref 3.5–5.1)
Sodium: 140 mmol/L (ref 135–145)

## 2021-12-03 LAB — LIPID PANEL
Cholesterol: 118 mg/dL (ref 0–200)
HDL: 26 mg/dL — ABNORMAL LOW (ref >40)
LDL Cholesterol: 76 mg/dL (ref 0–99)
Total CHOL/HDL Ratio: 4.5 RATIO
Triglycerides: 80 mg/dL (ref <150)
VLDL: 16 mg/dL (ref 0–40)

## 2021-12-03 LAB — TROPONIN I (HIGH SENSITIVITY)
Troponin I (High Sensitivity): 25 ng/L — ABNORMAL HIGH (ref <18)
Troponin I (High Sensitivity): 24 ng/L — ABNORMAL HIGH (ref <18)

## 2021-12-03 MED ORDER — PANTOPRAZOLE SODIUM 40 MG PO TBEC
40.0000 mg | DELAYED_RELEASE_TABLET | Freq: Every day | ORAL | 0 refills | Status: DC
  Filled 2021-12-03: qty 30, 30d supply, fill #0

## 2021-12-03 MED ORDER — DILTIAZEM HCL ER COATED BEADS 120 MG PO CP24
120.0000 mg | ORAL_CAPSULE | Freq: Every day | ORAL | 0 refills | Status: DC
  Filled 2021-12-03: qty 30, 30d supply, fill #0

## 2021-12-03 MED ORDER — DILTIAZEM HCL ER COATED BEADS 120 MG PO CP24
120.0000 mg | ORAL_CAPSULE | Freq: Every day | ORAL | Status: DC
  Administered 2021-12-03: 120 mg via ORAL
  Filled 2021-12-03: qty 1

## 2021-12-03 MED ORDER — HYDRALAZINE HCL 20 MG/ML IJ SOLN
5.0000 mg | INTRAMUSCULAR | Status: DC | PRN
Start: 2021-12-03 — End: 2021-12-03

## 2021-12-03 MED ORDER — POTASSIUM CHLORIDE CRYS ER 20 MEQ PO TBCR
20.0000 meq | EXTENDED_RELEASE_TABLET | Freq: Every day | ORAL | 0 refills | Status: DC
  Filled 2021-12-03: qty 7, 7d supply, fill #0

## 2021-12-03 NOTE — Care Management Obs Status (Signed)
[place] NOTIFICATION   Patient Details  Name: Steven Simmons MRN: [id number] Date of Birth: 21-Feb-1941   Medicare Observation Status Notification Given:  Yes    Illene Regulus, LCSW 12/03/2021, 1:40 PM

## 2021-12-03 NOTE — Progress Notes (Signed)
[name] to be D/C'd home per MD order. Discussed with the patient and all questions fully answered. Taxi voucher provided. Skin clean and dry without evidence of skin break down, no evidence of skin tears noted.  IV catheter discontinued intact. Site without signs and symptoms of complications. Dressing and pressure applied.  An After Visit Summary was printed and given to the patient. Medications delivered to bedside. Patient escorted via [place], and D/C home via taxi.  Melonie Florida  12/03/2021

## 2021-12-03 NOTE — ED Triage Notes (Signed)
Patient arrived with EMS from home reports persistent SOB worse with exertion this evening . He adds intermittent Afib /palpitations . Denies chest pain .

## 2021-12-03 NOTE — Discharge Summary (Signed)
Physician Discharge Summary  Steven Simmons XLK:[id number] DOB: Sep 06, 1940 DOA: 12/02/2021  PCP: [name], MD  Admit date: 12/02/2021 Discharge date: 12/03/2021  Admitted From: Home Disposition: Home  Recommendations for Outpatient Follow-up:  Follow up with PCP in 1-2 weeks, call for follow-up. Take medications as scheduled.  Home Health: N/A Equipment/Devices: N/A  Discharge Condition: Stable CODE STATUS: Full code Diet recommendation: Low-salt diet  Discharge summary: 81 year old gentleman with history of chronic A-fib, hypertension, GERD presented to the emergency room with feeling bad.  No particular symptoms.  He complained of feeling palpitations when he was walking to the bathroom.  Looks like he has recently become homeless, he could not give up his dog so he does not have much housing choices.  In the emergency room he was found to have rate controlled A-fib, chest x-ray with low bilateral lower lobe atelectasis.  Potassium was 2.9.  Magnesium is adequate.  Patient was monitored in the hospital given low electrolytes and A-fib.  Chronic A-fib: Rate controlled.  Received 2 doses of Cardizem 30 mg overnight.  Asymptomatic.  Changed to Cardizem CD 120 mg daily on discharge.  Carvedilol makes him feel bad so he will not take it. Poor compliance, and diverticular bleed and unreliable follow-up, poor candidate for anticoagulation. Potassium was replaced aggressively, will discharge patient with potassium replacement by mouth. Chest x-ray reviewed, clinically no evidence of pneumonia, received 1 dose of doxycycline.  Will not continue further antibiotics.  Medications were arranged through the hospital pharmacy, was advised to follow-up with his family practice clinic. Discussed about housing options with him, he is not getting much housing options because he has a dog and does not want to give up.  Will go to shelter. Stable for discharge.     Discharge Diagnoses:   Principal Problem:   Dyspnea on exertion Active Problems:   HTN (hypertension)   Hypokalemia   PNA (pneumonia)   Paroxysmal atrial fibrillation (HCC)   Elevated troponin   Normocytic anemia   Dyspnea    Discharge Instructions  Discharge Instructions     Diet - low sodium heart healthy   Complete by: As directed    Increase activity slowly   Complete by: As directed       Allergies as of 12/03/2021       Reactions   Ceclor [cefaclor] Other (See Comments)   Makes my heart slow and I feel like I am fading away   Veralipride    Gums bleed, tooth aches   Penicillins Other (See Comments)   Has patient had a PCN reaction causing immediate rash, facial/tongue/throat swelling, SOB or lightheadedness with hypotension: yes- only rash Has patient had a PCN reaction causing severe rash involving mucus membranes or skin necrosis: No Has patient had a PCN reaction that required hospitalization: No Has patient had a PCN reaction occurring within the last 10 years: No If all of the above answers are "NO", then may proceed with Cephalosporin use.   Amlodipine Other (See Comments)   Gum bleeding   Antihistamines, Chlorpheniramine-type Other (See Comments)   Dizziness and nose bleeds   Beta Adrenergic Blockers Other (See Comments)   Gums bleed   Celebrex [celecoxib] Other (See Comments)   Stomach pain   Ibuprofen Other (See Comments)   Stomach pain     Lipitor [atorvastatin] Hypertension   Nutrasweet Aspartame [aspartame] Other (See Comments)   Memory loss        Medication List     STOP taking these medications  carvedilol 6.25 MG tablet Commonly known as: Coreg   Potassium 99 MG Tabs       TAKE these medications    acetaminophen 500 MG tablet Commonly known as: TYLENOL Take 1,000 mg by mouth every 6 (six) hours as needed for mild pain.   diltiazem 120 MG 24 hr capsule Commonly known as: CARDIZEM CD Take 1 capsule (120 mg total) by mouth daily.    pantoprazole 40 MG tablet Commonly known as: PROTONIX Take 1 tablet (40 mg total) by mouth daily.   potassium chloride SA 20 MEQ tablet Commonly known as: KLOR-CON M Take 1 tablet (20 mEq total) by mouth daily for 7 days.        Follow-up Information     [name], MD Follow up in 2 week(s).   Specialty: Family Medicine Contact information: [place] [address] [phone]                Allergies  Allergen Reactions   Ceclor [Cefaclor] Other (See Comments)    Makes my heart slow and I feel like I am fading away   Veralipride     Gums bleed, tooth aches   Penicillins Other (See Comments)     Has patient had a PCN reaction causing immediate rash, facial/tongue/throat swelling, SOB or lightheadedness with hypotension: yes- only rash Has patient had a PCN reaction causing severe rash involving mucus membranes or skin necrosis: No Has patient had a PCN reaction that required hospitalization: No Has patient had a PCN reaction occurring within the last 10 years: No If all of the above answers are "NO", then may proceed with Cephalosporin use.   Amlodipine Other (See Comments)    Gum bleeding   Antihistamines, Chlorpheniramine-Type Other (See Comments)    Dizziness and nose bleeds   Beta Adrenergic Blockers Other (See Comments)    Gums bleed   Celebrex [Celecoxib] Other (See Comments)    Stomach pain   Ibuprofen Other (See Comments)    Stomach pain     Lipitor [Atorvastatin] Hypertension   Nutrasweet Aspartame [Aspartame] Other (See Comments)    Memory loss    Consultations: None   Procedures/Studies: DG Chest Port 1 View  Result Date: 12/02/2021 CLINICAL DATA:  w 81 year old male presenting for evaluation of shortness of breath. EXAM: PORTABLE CHEST 1 VIEW COMPARISON:  Aug 22, 2017. FINDINGS: EKG leads project over the chest. Trachea midline. Cardiomediastinal contours and hilar structures are stable. Subtle basilar opacity RIGHT  greater than LEFT mainly linear. No lobar consolidation. No sign of pleural effusion or pneumothorax. On limited assessment no acute skeletal findings. IMPRESSION: Subtle basilar opacity RIGHT greater than LEFT may represent atelectasis. Difficult to exclude developing airspace process at the RIGHT lung base. Electronically Signed   By: Zetta Bills M.D.   On: 12/02/2021 12:01   (Echo, Carotid, EGD, Colonoscopy, ERCP)    Subjective: Patient was seen and examined.  Denies any complaints.  Telemetry shows rate controlled A-fib with heart rate 50-70. Patient tells me that he has to stay in the hospital until he can do his outpatient appointments.  We discussed about housing options.  He is able to move around.  He is asymptomatic walking in the hallway. No fever.  No cough or congestion.  No sputum production.   Discharge Exam: Vitals:   12/03/21 0431 12/03/21 0826  BP: (!) 146/89 (!) 118/52  Pulse: 74 69  Resp: 17 18  Temp: (!) 97.2 F (36.2 C) (!) 97.4 F (  36.3 C)  SpO2: 92% (!) 87%   Vitals:   12/03/21 0003 12/03/21 0034 12/03/21 0431 12/03/21 0826  BP: (!) 176/92 (!) 154/88 (!) 146/89 (!) 118/52  Pulse: 75 78 74 69  Resp: '18 18 17 18  '$ Temp: (!) 97.2 F (36.2 C)  (!) 97.2 F (36.2 C) (!) 97.4 F (36.3 C)  TempSrc: Oral  Oral Oral  SpO2: 95%  92% (!) 87%  Weight:      Height:        General: Pt is alert, awake, not in acute distress On room air.  Disheveled. Cardiovascular: Irregularly irregular, S1/S2 +, no rubs, no gallops Respiratory: CTA bilaterally, no wheezing, no rhonchi, no added sounds. Abdominal: Soft, NT, ND, bowel sounds + Extremities: no edema, no cyanosis    The results of significant diagnostics from this hospitalization (including imaging, microbiology, ancillary and laboratory) are listed below for reference.     Microbiology: Recent Results (from the past 240 hour(s))  Resp Panel by RT-PCR (Flu A&B, Covid) Anterior Nasal Swab     Status: None    Collection Time: 12/02/21  1:45 PM   Specimen: Anterior Nasal Swab  Result Value Ref Range Status   SARS Coronavirus 2 by RT PCR NEGATIVE NEGATIVE Final    Comment: (NOTE) SARS-CoV-2 target nucleic acids are NOT DETECTED.  The SARS-CoV-2 RNA is generally detectable in upper respiratory specimens during the acute phase of infection. The lowest concentration of SARS-CoV-2 viral copies this assay can detect is 138 copies/mL. A negative result does not preclude SARS-Cov-2 infection and should not be used as the sole basis for treatment or other patient management decisions. A negative result may occur with  improper specimen collection/handling, submission of specimen other than nasopharyngeal swab, presence of viral mutation(s) within the areas targeted by this assay, and inadequate number of viral copies(<138 copies/mL). A negative result must be combined with clinical observations, patient history, and epidemiological information. The expected result is Negative.  Fact Sheet for Patients:  EntrepreneurPulse.com.au  Fact Sheet for Healthcare Providers:  IncredibleEmployment.be  This test is no t yet approved or cleared by the [place] FDA and  has been authorized for detection and/or diagnosis of SARS-CoV-2 by FDA under an Emergency Use Authorization (EUA). This EUA will remain  in effect (meaning this test can be used) for the duration of the COVID-19 declaration under Section 564(b)(1) of the Act, 21 U.S.C.section 360bbb-3(b)(1), unless the authorization is terminated  or revoked sooner.       Influenza A by PCR NEGATIVE NEGATIVE Final   Influenza B by PCR NEGATIVE NEGATIVE Final    Comment: (NOTE) The Xpert Xpress SARS-CoV-2/FLU/RSV plus assay is intended as an aid in the diagnosis of influenza from Nasopharyngeal swab specimens and should not be used as a sole basis for treatment. Nasal washings and aspirates are unacceptable for  Xpert Xpress SARS-CoV-2/FLU/RSV testing.  Fact Sheet for Patients: EntrepreneurPulse.com.au  Fact Sheet for Healthcare Providers: IncredibleEmployment.be  This test is not yet approved or cleared by the [place] FDA and has been authorized for detection and/or diagnosis of SARS-CoV-2 by FDA under an Emergency Use Authorization (EUA). This EUA will remain in effect (meaning this test can be used) for the duration of the COVID-19 declaration under Section 564(b)(1) of the Act, 21 U.S.C. section 360bbb-3(b)(1), unless the authorization is terminated or revoked.  Performed at [facility], [place] [address] [place], [place] [address]      Labs: BNP (last 3 results) No  results for input(s): "BNP" in the last 8760 hours. Basic Metabolic Panel: Recent Labs  Lab 12/02/21 1143 12/02/21 2044  NA 139  --   K 2.9*  --   CL 106  --   CO2 23  --   GLUCOSE 128*  --   BUN 14  --   CREATININE 0.93 0.93  CALCIUM 8.2*  --   MG 1.9  --    Liver Function Tests: No results for input(s): "AST", "ALT", "ALKPHOS", "BILITOT", "PROT", "ALBUMIN" in the last 168 hours. No results for input(s): "LIPASE", "AMYLASE" in the last 168 hours. No results for input(s): "AMMONIA" in the last 168 hours. CBC: Recent Labs  Lab 12/02/21 1143 12/02/21 2044  WBC 8.0 9.3  NEUTROABS 6.6  --   HGB 11.4* 12.5*  HCT 35.8* 38.4*  MCV 90.6 89.5  PLT 380 407*   Cardiac Enzymes: No results for input(s): "CKTOTAL", "CKMB", "CKMBINDEX", "TROPONINI" in the last 168 hours. BNP: Invalid input(s): "POCBNP" CBG: Recent Labs  Lab 12/03/21 1000  GLUCAP 136*   D-Dimer No results for input(s): "DDIMER" in the last 72 hours. Hgb A1c No results for input(s): "HGBA1C" in the last 72 hours. Lipid Profile Recent Labs    12/03/21 0541  CHOL 118  HDL 26*  LDLCALC 76  TRIG 80  CHOLHDL 4.5   Thyroid function studies No results for input(s): "TSH",  "T4TOTAL", "T3FREE", "THYROIDAB" in the last 72 hours.  Invalid input(s): "FREET3" Anemia work up Recent Labs    12/02/21 2044  TIBC 256  IRON 36*   Urinalysis    Component Value Date/Time   COLORURINE YELLOW 12/08/2018 2054   APPEARANCEUR CLEAR 12/08/2018 2054   LABSPEC 1.011 12/08/2018 2054   PHURINE 7.0 12/08/2018 2054   GLUCOSEU NEGATIVE 12/08/2018 2054   HGBUR SMALL (A) 12/08/2018 2054   [place] NEGATIVE 12/08/2018 2054   [place] NEGATIVE 12/08/2018 2054   PROTEINUR NEGATIVE 12/08/2018 2054   UROBILINOGEN 1.0 09/13/2013 1954   NITRITE NEGATIVE 12/08/2018 2054   LEUKOCYTESUR TRACE (A) 12/08/2018 2054   Sepsis Labs Recent Labs  Lab 12/02/21 1143 12/02/21 2044  WBC 8.0 9.3   Microbiology Recent Results (from the past 240 hour(s))  Resp Panel by RT-PCR (Flu A&B, Covid) Anterior Nasal Swab     Status: None   Collection Time: 12/02/21  1:45 PM   Specimen: Anterior Nasal Swab  Result Value Ref Range Status   SARS Coronavirus 2 by RT PCR NEGATIVE NEGATIVE Final    Comment: (NOTE) SARS-CoV-2 target nucleic acids are NOT DETECTED.  The SARS-CoV-2 RNA is generally detectable in upper respiratory specimens during the acute phase of infection. The lowest concentration of SARS-CoV-2 viral copies this assay can detect is 138 copies/mL. A negative result does not preclude SARS-Cov-2 infection and should not be used as the sole basis for treatment or other patient management decisions. A negative result may occur with  improper specimen collection/handling, submission of specimen other than nasopharyngeal swab, presence of viral mutation(s) within the areas targeted by this assay, and inadequate number of viral copies(<138 copies/mL). A negative result must be combined with clinical observations, patient history, and epidemiological information. The expected result is Negative.  Fact Sheet for Patients:  EntrepreneurPulse.com.au  Fact Sheet for  Healthcare Providers:  IncredibleEmployment.be  This test is no t yet approved or cleared by the [place] FDA and  has been authorized for detection and/or diagnosis of SARS-CoV-2 by FDA under an Emergency Use Authorization (EUA). This EUA will remain  in effect (meaning this test can be used) for the duration of the COVID-19 declaration under Section 564(b)(1) of the Act, 21 U.S.C.section 360bbb-3(b)(1), unless the authorization is terminated  or revoked sooner.       Influenza A by PCR NEGATIVE NEGATIVE Final   Influenza B by PCR NEGATIVE NEGATIVE Final    Comment: (NOTE) The Xpert Xpress SARS-CoV-2/FLU/RSV plus assay is intended as an aid in the diagnosis of influenza from Nasopharyngeal swab specimens and should not be used as a sole basis for treatment. Nasal washings and aspirates are unacceptable for Xpert Xpress SARS-CoV-2/FLU/RSV testing.  Fact Sheet for Patients: EntrepreneurPulse.com.au  Fact Sheet for Healthcare Providers: IncredibleEmployment.be  This test is not yet approved or cleared by the [place] FDA and has been authorized for detection and/or diagnosis of SARS-CoV-2 by FDA under an Emergency Use Authorization (EUA). This EUA will remain in effect (meaning this test can be used) for the duration of the COVID-19 declaration under Section 564(b)(1) of the Act, 21 U.S.C. section 360bbb-3(b)(1), unless the authorization is terminated or revoked.  Performed at [facility] [place] [address], [place], [place] [address]      Time coordinating discharge: 28 minutes  SIGNED:   Barb Merino, MD  [facility] 12/03/2021, 11:33 AM

## 2021-12-03 NOTE — Progress Notes (Signed)
  Echocardiogram 2D Echocardiogram has been performed.  Darlina Sicilian M 12/03/2021, 11:32 AM

## 2021-12-03 NOTE — ED Provider Triage Note (Signed)
Emergency Medicine Provider Triage Evaluation Note  Jayce Kainz , a 81 y.o. male  was evaluated in triage.  Pt complains of shortness of breath and dyspnea on exertion. Was discharged from hospital this morning. States when he got home he got more short of breath, especially after moving lawn mower. Hx of afib, not on chronic anticoagulation due to GI bleed. Hx of homelessness  Review of Systems  Positive: SOB, dyspnea on exertion Negative: CP  Physical Exam  BP 118/71 (BP Location: Right Arm)   Pulse 79   Temp 98.5 F (36.9 C) (Oral)   Resp 20   SpO2 97%  Gen:   Awake, no distress   Resp:  Normal effort  MSK:   Moves extremities without difficulty  Other:    Medical Decision Making  Medically screening exam initiated at 8:05 PM.  Appropriate orders placed.  [name] was informed that the remainder of the evaluation will be completed by another provider, this initial triage assessment does not replace that evaluation, and the importance of remaining in the ED until their evaluation is complete.     Kateri Plummer, Hershal Coria 12/03/21 2007

## 2021-12-04 MED ORDER — ACETAMINOPHEN 500 MG PO TABS
1000.0000 mg | ORAL_TABLET | Freq: Once | ORAL | Status: AC
  Administered 2021-12-04: 1000 mg via ORAL
  Filled 2021-12-04: qty 2

## 2021-12-04 NOTE — ED Notes (Signed)
Pt refused DC vitals stating hes already dressed and doesn't want to put that stuff back on. NAD NARD. Ambulatory

## 2021-12-04 NOTE — ED Provider Notes (Signed)
[place] EMERGENCY DEPARTMENT Provider Note  CSN: [id number] Arrival date & time: 12/03/21 1942  Chief Complaint(s) Exertional Dyspnea / Afib   HPI Steven Simmons is a 81 y.o. male with a past medical history listed below including paroxysmal A-fib not anticoagulated due to previous GI bleeds who was recently admitted for atrial fibrillation with mildly elevated troponins and hypokalemia.  Patient was discharged earlier in the day.  Returns tonight for another episode of dyspnea on exertion and generalized fatigue.  This occurred this evening.  Lasted for less than an hour.  Now resolved.  Denied any rapid palpitations.  Denied any associated chest pain.  No coughing or congestion.  During patient's admission he was initially treated for pneumonia but his symptoms and clinical picture were not consistent with that thus biotics were discontinued. ECHO with EF 55-60%. Atrial Fib controled with diltiazem. Patient was transitioned to Cardizem.     The history is provided by the patient.    Past Medical History Past Medical History:  Diagnosis Date   Allergy    Arthritis    shoulders (08/23/2017)   Aspiration pneumonia ([place]) 08/23/2017   Bleeding stomach ulcer 1988   Cataract    Complication of anesthesia 1993   " dont' know the name of it but they gave it to me at [facility] in 1993 "   I was cold, shaking and had a headache for hours afterwards "   GERD (gastroesophageal reflux disease)    Heart murmur    "when I was a child"   History of blood transfusion 1988   "bleeding ulcer"     History of TIA (transient ischemic attack) 07/2015   pt denies this hx on 08/23/2017   Hypertension    Migraine 1959-1980   "did have 4 ~ 2 wks ago; just the aura then" (08/23/2017)   Peptic ulcer disease    Sepsis secondary to UTI ([place]) 08/22/2017   Patient Active Problem List   Diagnosis Date Noted   Paroxysmal atrial fibrillation ([place]) 12/02/2021   Dyspnea on exertion  12/02/2021   Elevated troponin 12/02/2021   Normocytic anemia 12/02/2021   Dyspnea 12/02/2021   Acute blood loss anemia    BRBPR (bright red blood per rectum) 10/01/2021   Atrial fibrillation with RVR ([place]) 10/01/2021   Prediabetes 11/16/2018   PNA (pneumonia) 08/23/2017   Appetite impaired 08/23/2017   Singultus, recent history 08/23/2017   Enlarged prostate on Pelvic CT 08/23/2017   GERD (gastroesophageal reflux disease) 08/22/2017   Hypokalemia 09/24/2016   Cough 01/25/2014   Erectile dysfunction 12/28/2013   HLD (hyperlipidemia) 08/11/2012   HTN (hypertension) 06/29/2012   Home Medication(s) Prior to Admission medications   Medication Sig Start Date End Date Taking? Authorizing Provider  acetaminophen (TYLENOL) 500 MG tablet Take 1,000 mg by mouth every 6 (six) hours as needed for mild pain.    [provider]  diltiazem (CARDIZEM CD) 120 MG 24 hr capsule Take 1 capsule (120 mg total) by mouth daily. 12/03/21 01/02/22  Barb Merino, MD  pantoprazole (PROTONIX) 40 MG tablet Take 1 tablet (40 mg total) by mouth daily. 12/03/21 01/02/22  Barb Merino, MD  potassium chloride SA (KLOR-CON M) 20 MEQ tablet Take 1 tablet (20 mEq total) by mouth daily for 7 days. 12/03/21 12/10/21  Barb Merino, MD  ranitidine (ZANTAC) 150 MG tablet Take 1 tablet (150 mg total) by mouth at bedtime. Patient not taking: Reported on 12/09/2018 09/02/17 12/09/18  Willia Craze, NP  Allergies Ceclor [cefaclor]; Veralipride; Penicillins; Amlodipine; Antihistamines, chlorpheniramine-type; Beta adrenergic blockers; Celebrex [celecoxib]; Ibuprofen; Lipitor [atorvastatin]; and Nutrasweet aspartame [aspartame]  Review of Systems Review of Systems As noted in HPI  Physical Exam Vital Signs  I have reviewed the triage vital signs BP (!) 151/56   Pulse 73   Temp 98.4 F (36.9  C) (Oral)   Resp (!) 25   SpO2 93%   Physical Exam Vitals reviewed.  Constitutional:      General: He is not in acute distress.    Appearance: He is well-developed. He is not diaphoretic.  HENT:     Head: Normocephalic and atraumatic.     Nose: Nose normal.  Eyes:     General: No scleral icterus.       Right eye: No discharge.        Left eye: No discharge.     Conjunctiva/sclera: Conjunctivae normal.     Pupils: Pupils are equal, round, and reactive to light.  Cardiovascular:     Rate and Rhythm: Normal rate. Rhythm irregularly irregular.     Heart sounds: No murmur heard.    No friction rub. No gallop.  Pulmonary:     Effort: Pulmonary effort is normal. No respiratory distress.     Breath sounds: Normal breath sounds. No stridor or decreased air movement. No wheezing or rales.  Abdominal:     General: There is no distension.     Palpations: Abdomen is soft.     Tenderness: There is no abdominal tenderness.  Musculoskeletal:        General: No tenderness.     Cervical back: Normal range of motion and neck supple.     Right lower leg: Edema (trace) present.     Left lower leg: Edema (trace) present.  Skin:    General: Skin is warm and dry.     Findings: No erythema or rash.  Neurological:     Mental Status: He is alert and oriented to person, place, and time.     ED Results and Treatments Labs (all labs ordered are listed, but only abnormal results are displayed) Labs Reviewed  CBC - Abnormal; Notable for the following components:      Result Value   RBC 3.90 (*)    Hemoglobin 11.3 (*)    HCT 34.6 (*)    Platelets 443 (*)    All other components within normal limits  BASIC METABOLIC PANEL - Abnormal; Notable for the following components:   Glucose, Bld 158 (*)    Creatinine, Ser 1.37 (*)    GFR, Estimated 52 (*)    All other components within normal limits  TROPONIN I (HIGH SENSITIVITY) - Abnormal; Notable for the following components:   Troponin I (High  Sensitivity) 25 (*)    All other components within normal limits  TROPONIN I (HIGH SENSITIVITY) - Abnormal; Notable for the following components:   Troponin I (High Sensitivity) 24 (*)    All other components within normal limits  EKG  EKG Interpretation  Date/Time:  Thursday December 03 2021 19:49:37 EDT Ventricular Rate:  89 PR Interval:    QRS Duration: 80 QT Interval:  384 QTC Calculation: 467 R Axis:   17 Text Interpretation: Atrial fibrillation Abnormal ECG When compared with ECG of 02-Dec-2021 12:52, PREVIOUS ECG IS PRESENT Confirmed by Addison Lank [phone]) on 12/04/2021 1:03:41 AM       Radiology DG Chest 2 View  Result Date: 12/03/2021 CLINICAL DATA:  Shortness of breath EXAM: CHEST - 2 VIEW COMPARISON:  Chest x-ray 12/02/2021 FINDINGS: There are trace bilateral pleural effusions. There is no focal lung infiltrate, pleural effusion or pneumothorax. The cardiomediastinal silhouette is stable, the heart is mildly enlarged. No acute fractures are seen. IMPRESSION: 1. Trace bilateral pleural effusions. 2. Mild cardiomegaly. Electronically Signed   By: Ronney Asters M.D.   On: 12/03/2021 23:33   ECHOCARDIOGRAM COMPLETE  Result Date: 12/03/2021    ECHOCARDIOGRAM REPORT   Patient Name:   BRANDYN LOWREY Date of Exam: 12/03/2021 Medical Rec #:  [id number]      Height:       71.0 in Accession #:    [id number]     Weight:       201.9 lb Date of Birth:  [birth year]       BSA:          2.117 m Patient Age:    89 years       BP:           118/52 mmHg Patient Gender: M              HR:           77 bpm. Exam Location:  Inpatient Procedure: 2D Echo, 3D Echo, Cardiac Doppler and Color Doppler Indications:    Elevated Troponin  History:        Patient has prior history of Echocardiogram examinations, most                 recent 06/29/2012. TIA, Arrythmias:Atrial Fibrillation,                  Signs/Symptoms:Shortness of Breath; Risk Factors:Hypertension.  Sonographer:    Darlina Sicilian RDCS Referring Phys: [id number] [place]  1. Left ventricular ejection fraction, by estimation, is 55 to 60%. The left ventricle has normal function. The left ventricle has no regional wall motion abnormalities. There is moderate asymmetric left ventricular hypertrophy of the septal segment. Left ventricular diastolic function could not be evaluated. Elevated left ventricular end-diastolic pressure.  2. Right ventricular systolic function is normal. The right ventricular size is normal. There is moderately elevated pulmonary artery systolic pressure.  3. Left atrial size was severely dilated.  4. Mitral valve leaflet movement is restricted and heavily calcified in the A2P2 region. Visually it appears similar to a post-MitraClip, however there is no history of a prior procedure. The mitral valve is degenerative. Mild mitral valve regurgitation. No evidence of mitral stenosis. There is mild prolapse of anterior of the mitral valve.  5. The aortic valve is tricuspid. There is mild calcification of the aortic valve. There is mild thickening of the aortic valve. Aortic valve regurgitation is not visualized. No aortic stenosis is present.  6. There is mild (Grade II) protruding plaque involving the ascending aorta.  7. The inferior vena cava is dilated in size with <50% respiratory variability, suggesting right atrial pressure of 15 mmHg. FINDINGS  Left Ventricle: Left ventricular ejection fraction, by estimation, is  55 to 60%. The left ventricle has normal function. The left ventricle has no regional wall motion abnormalities. The left ventricular internal cavity size was normal in size. There is  moderate asymmetric left ventricular hypertrophy of the septal segment. Left ventricular diastolic function could not be evaluated due to atrial fibrillation. Left ventricular diastolic function could not be  evaluated. Elevated left ventricular end-diastolic pressure. Right Ventricle: The right ventricular size is normal. No increase in right ventricular wall thickness. Right ventricular systolic function is normal. There is moderately elevated pulmonary artery systolic pressure. The tricuspid regurgitant velocity is 2.76 m/s, and with an assumed right atrial pressure of 15 mmHg, the estimated right ventricular systolic pressure is 76.1 mmHg. Left Atrium: Left atrial size was severely dilated. Right Atrium: Right atrial size was normal in size. Pericardium: Trivial pericardial effusion is present. Mitral Valve: Mitral valve leaflet movement is restricted and heavily calcified in the A2P2 region. Visually it appears similar to a post-MitraClip, however there is no history of a prior procedure. The mitral valve is degenerative in appearance. There is mild prolapse of anterior of the mitral valve. There is moderate thickening of the anterior mitral valve leaflet(s). There is moderate calcification of the anterior mitral valve leaflet(s). Moderately decreased mobility of the mitral valve leaflets. Mild mitral annular calcification. Mild mitral valve regurgitation. No evidence of mitral valve stenosis. MV peak gradient, 10.8 mmHg. The mean mitral valve gradient is 3.0 mmHg. Tricuspid Valve: The tricuspid valve is normal in structure. Tricuspid valve regurgitation is mild . No evidence of tricuspid stenosis. Aortic Valve: The aortic valve is tricuspid. There is mild calcification of the aortic valve. There is mild thickening of the aortic valve. Aortic valve regurgitation is not visualized. No aortic stenosis is present. Aortic valve mean gradient measures 5.7 mmHg. Aortic valve peak gradient measures 10.2 mmHg. Aortic valve area, by VTI measures 1.46 cm. Pulmonic Valve: The pulmonic valve was normal in structure. Pulmonic valve regurgitation is trivial. No evidence of pulmonic stenosis. Aorta: The aortic root is normal in  size and structure. There is mild (Grade II) protruding plaque involving the ascending aorta. Venous: The inferior vena cava is dilated in size with less than 50% respiratory variability, suggesting right atrial pressure of 15 mmHg. IAS/Shunts: No atrial level shunt detected by color flow Doppler.  LEFT VENTRICLE PLAX 2D LVIDd:         4.90 cm      Diastology LVIDs:         3.00 cm      LV e' medial:    9.02 cm/s LV PW:         1.00 cm      LV E/e' medial:  16.4 LV IVS:        1.50 cm      LV e' lateral:   8.01 cm/s LVOT diam:     2.00 cm      LV E/e' lateral: 18.5 LV SV:         48 LV SV Index:   23 LVOT Area:     3.14 cm  LV Volumes (MOD) LV vol d, MOD A2C: 118.0 ml LV vol d, MOD A4C: 122.0 ml LV vol s, MOD A2C: 38.0 ml LV vol s, MOD A4C: 58.1 ml LV SV MOD A2C:     80.0 ml LV SV MOD A4C:     122.0 ml LV SV MOD BP:      71.7 ml RIGHT VENTRICLE RV S prime:  13.40 cm/s LEFT ATRIUM              Index        RIGHT ATRIUM           Index LA diam:        5.10 cm  2.41 cm/m   RA Area:     20.60 cm LA Vol (A2C):   79.9 ml  37.74 ml/m  RA Volume:   59.50 ml  28.10 ml/m LA Vol (A4C):   106.0 ml 50.07 ml/m LA Biplane Vol: 93.7 ml  44.26 ml/m  AORTIC VALVE                     PULMONIC VALVE AV Area (Vmax):    1.62 cm      PR End Diast Vel: 7.08 msec AV Area (Vmean):   1.48 cm AV Area (VTI):     1.46 cm AV Vmax:           160.00 cm/s AV Vmean:          109.267 cm/s AV VTI:            0.329 m AV Peak Grad:      10.2 mmHg AV Mean Grad:      5.7 mmHg LVOT Vmax:         82.50 cm/s LVOT Vmean:        51.400 cm/s LVOT VTI:          0.153 m LVOT/AV VTI ratio: 0.47  AORTA Ao Root diam: 3.50 cm Ao Asc diam:  3.30 cm MITRAL VALVE                  TRICUSPID VALVE MV Area (PHT): 3.81 cm       TR Peak grad:   30.5 mmHg MV Area VTI:   1.55 cm       TR Mean grad:   21.0 mmHg MV Peak grad:  10.8 mmHg      TR Vmax:        276.00 cm/s MV Mean grad:  3.0 mmHg       TR Vmean:       226.0 cm/s MV Vmax:       1.64 m/s MV Vmean:       70.6 cm/s      SHUNTS MV Decel Time: 199 msec       Systemic VTI:  0.15 m MR Peak grad:    104.9 mmHg   Systemic Diam: 2.00 cm MR Mean grad:    73.0 mmHg MR Vmax:         512.00 cm/s MR Vmean:        403.0 cm/s MR PISA:         1.01 cm MR PISA Eff ROA: 7 mm MR PISA Radius:  0.40 cm MV E velocity: 148.24 cm/s Skeet Latch MD Electronically signed by Skeet Latch MD Signature Date/Time: 12/03/2021/5:34:36 PM    Final     Medications Ordered in ED Medications  acetaminophen (TYLENOL) tablet 1,000 mg (1,000 mg Oral Given 12/04/21 0217)  Procedures Procedures  (including critical care time)  Medical Decision Making / ED Course   Medical Decision Making Amount and/or Complexity of Data Reviewed External Data Reviewed: notes.    Details: Admission notes, detailed above. Labs: ordered. Decision-making details documented in ED Course. Radiology: ordered and independent interpretation performed. Decision-making details documented in ED Course. ECG/medicine tests: ordered and independent interpretation performed. Decision-making details documented in ED Course.    Patient presents with recurrent episode of dyspnea on exertion which is now resolved.  Similar to recent/admission. This was attributed to possible intermittent A-fib RVR. Currently EKG shows A-fib with rate controlled between 70s and 80s. CBC did not show evidence of leukocytosis.  He does have mild anemia but close to his baseline. Metabolic panel with no electrolyte derangement.  Mild renal insufficienc, but not consistent with AKI. Troponin as well as slightly elevated but flat from his recent admission.  Delta troponin was flat. Chest x-ray without evidence of pneumonia, pneumothorax, pulmonary edema.  Patient monitored for several hours without respiratory distress, hypoxia or recurrence of  A-fib RVR. Related without complication Tolerating p.o.  No indication for readmission at this time. PCP and AFIB clinic f/u recommended.       Final Clinical Impression(s) / ED Diagnoses Final diagnoses:  Chronic atrial fibrillation ([place])  The patient appears reasonably screened and/or stabilized for discharge and I doubt any other medical condition or other [facility] requiring further screening, evaluation, or treatment in the ED at this time. I have discussed the findings, Dx and Tx plan with the patient/family who expressed understanding and agree(s) with the plan. Discharge instructions discussed at length. The patient/family was given strict return precautions who verbalized understanding of the instructions. No further questions at time of discharge.  Disposition: Discharge  Condition: Good  ED Discharge Orders          Ordered    Amb Referral to AFIB Clinic        12/04/21 0218                      This chart was dictated using voice recognition software.  Despite best efforts to proofread,  errors can occur which can change the documentation meaning.    Fatima Blank, MD 12/04/21 [phone]

## 2021-12-05 ENCOUNTER — Encounter (HOSPITAL_COMMUNITY): Payer: Self-pay

## 2021-12-05 ENCOUNTER — Other Ambulatory Visit: Payer: Self-pay

## 2021-12-05 ENCOUNTER — Emergency Department (HOSPITAL_COMMUNITY): Payer: Medicare Other

## 2021-12-05 ENCOUNTER — Emergency Department (HOSPITAL_COMMUNITY)
Admission: EM | Admit: 2021-12-05 | Discharge: 2021-12-05 | Disposition: A | Discharge status: Home / Self Care | Payer: Medicare Other | Attending: Emergency Medicine | Admitting: Emergency Medicine

## 2021-12-05 DIAGNOSIS — R531 Weakness: Secondary | ICD-10-CM | POA: Diagnosis not present

## 2021-12-05 DIAGNOSIS — D649 Anemia, unspecified: Secondary | ICD-10-CM | POA: Diagnosis not present

## 2021-12-05 DIAGNOSIS — Z59819 Housing instability, housed unspecified: Secondary | ICD-10-CM | POA: Diagnosis not present

## 2021-12-05 DIAGNOSIS — Z20822 Contact with and (suspected) exposure to covid-19: Secondary | ICD-10-CM | POA: Diagnosis not present

## 2021-12-05 DIAGNOSIS — Z5941 Food insecurity: Secondary | ICD-10-CM

## 2021-12-05 DIAGNOSIS — I482 Chronic atrial fibrillation, unspecified: Secondary | ICD-10-CM

## 2021-12-05 DIAGNOSIS — R0602 Shortness of breath: Secondary | ICD-10-CM | POA: Diagnosis not present

## 2021-12-05 DIAGNOSIS — Z59869 Financial insecurity, unspecified: Secondary | ICD-10-CM

## 2021-12-05 DIAGNOSIS — Z5986 Financial insecurity: Secondary | ICD-10-CM

## 2021-12-05 DIAGNOSIS — R778 Other specified abnormalities of plasma proteins: Secondary | ICD-10-CM | POA: Insufficient documentation

## 2021-12-05 DIAGNOSIS — I10 Essential (primary) hypertension: Secondary | ICD-10-CM | POA: Insufficient documentation

## 2021-12-05 DIAGNOSIS — Z79899 Other long term (current) drug therapy: Secondary | ICD-10-CM | POA: Insufficient documentation

## 2021-12-05 DIAGNOSIS — I499 Cardiac arrhythmia, unspecified: Secondary | ICD-10-CM | POA: Diagnosis not present

## 2021-12-05 DIAGNOSIS — J90 Pleural effusion, not elsewhere classified: Secondary | ICD-10-CM | POA: Diagnosis not present

## 2021-12-05 DIAGNOSIS — I4891 Unspecified atrial fibrillation: Secondary | ICD-10-CM | POA: Diagnosis not present

## 2021-12-05 DIAGNOSIS — R42 Dizziness and giddiness: Secondary | ICD-10-CM | POA: Diagnosis not present

## 2021-12-05 LAB — BASIC METABOLIC PANEL
Anion gap: 11 (ref 5–15)
BUN: 9 mg/dL (ref 8–23)
CO2: 27 mmol/L (ref 22–32)
Calcium: 9.1 mg/dL (ref 8.9–10.3)
Chloride: 105 mmol/L (ref 98–111)
Creatinine, Ser: 0.99 mg/dL (ref 0.61–1.24)
GFR, Estimated: >60 mL/min (ref >60)
Glucose, Bld: 102 mg/dL — ABNORMAL HIGH (ref 70–99)
Potassium: 3.4 mmol/L — ABNORMAL LOW (ref 3.5–5.1)
Sodium: 143 mmol/L (ref 135–145)

## 2021-12-05 LAB — CBC WITH DIFFERENTIAL/PLATELET
Abs Immature Granulocytes: 0.05 10*3/uL (ref 0.00–0.07)
Basophils Absolute: 0 10*3/uL (ref 0.0–0.1)
Basophils Relative: 0 %
Eosinophils Absolute: 0 10*3/uL (ref 0.0–0.5)
Eosinophils Relative: 1 %
HCT: 38.7 % — ABNORMAL LOW (ref 39.0–52.0)
Hemoglobin: 12.4 g/dL — ABNORMAL LOW (ref 13.0–17.0)
Immature Granulocytes: 1 %
Lymphocytes Relative: 12 %
Lymphs Abs: 1 10*3/uL (ref 0.7–4.0)
MCH: 28.6 pg (ref 26.0–34.0)
MCHC: 32 g/dL (ref 30.0–36.0)
MCV: 89.4 fL (ref 80.0–100.0)
Monocytes Absolute: 0.6 10*3/uL (ref 0.1–1.0)
Monocytes Relative: 7 %
Neutro Abs: 6.5 10*3/uL (ref 1.7–7.7)
Neutrophils Relative %: 79 %
Platelets: 415 10*3/uL — ABNORMAL HIGH (ref 150–400)
RBC: 4.33 MIL/uL (ref 4.22–5.81)
RDW: 13.4 % (ref 11.5–15.5)
WBC: 8.2 10*3/uL (ref 4.0–10.5)
nRBC: 0 % (ref 0.0–0.2)

## 2021-12-05 LAB — MAGNESIUM: Magnesium: 2 mg/dL (ref 1.7–2.4)

## 2021-12-05 LAB — URINALYSIS, ROUTINE W REFLEX MICROSCOPIC
Bilirubin Urine: NEGATIVE
Glucose, UA: NEGATIVE mg/dL
Hgb urine dipstick: NEGATIVE
Ketones, ur: NEGATIVE mg/dL
Leukocytes,Ua: NEGATIVE
Nitrite: NEGATIVE
Protein, ur: NEGATIVE mg/dL
Specific Gravity, Urine: 1.003 — ABNORMAL LOW (ref 1.005–1.030)
pH: 8 (ref 5.0–8.0)

## 2021-12-05 LAB — CBG MONITORING, ED: Glucose-Capillary: 174 mg/dL — ABNORMAL HIGH (ref 70–99)

## 2021-12-05 LAB — TROPONIN I (HIGH SENSITIVITY)
Troponin I (High Sensitivity): 20 ng/L — ABNORMAL HIGH (ref <18)
Troponin I (High Sensitivity): 22 ng/L — ABNORMAL HIGH (ref <18)
Troponin I (High Sensitivity): 24 ng/L — ABNORMAL HIGH (ref <18)

## 2021-12-05 LAB — BRAIN NATRIURETIC PEPTIDE: B Natriuretic Peptide: 447.2 pg/mL — ABNORMAL HIGH (ref 0.0–100.0)

## 2021-12-05 LAB — RESP PANEL BY RT-PCR (FLU A&B, COVID) ARPGX2
Influenza A by PCR: NEGATIVE
Influenza B by PCR: NEGATIVE
SARS Coronavirus 2 by RT PCR: NEGATIVE

## 2021-12-05 MED ORDER — DILTIAZEM HCL ER 120 MG PO TB24
120.0000 mg | ORAL_TABLET | Freq: Every day | ORAL | Status: DC
  Filled 2021-12-05: qty 1

## 2021-12-05 MED ORDER — DILTIAZEM HCL ER COATED BEADS 120 MG PO CP24
120.0000 mg | ORAL_CAPSULE | Freq: Every day | ORAL | Status: DC
  Administered 2021-12-05: 120 mg via ORAL
  Filled 2021-12-05: qty 1

## 2021-12-05 MED ORDER — POTASSIUM CHLORIDE CRYS ER 20 MEQ PO TBCR
20.0000 meq | EXTENDED_RELEASE_TABLET | Freq: Once | ORAL | Status: AC
  Administered 2021-12-05: 20 meq via ORAL
  Filled 2021-12-05: qty 1

## 2021-12-05 NOTE — Progress Notes (Signed)
Transition of Care [facility]) - Emergency Department Mini Assessment   Patient Details  Name: Steven Simmons MRN: [id number] Date of Birth: October 01, 1940  Transition of Care [facility]) CM/SW Contact:    Rodney Booze, LCSW Phone Number: 12/05/2021, 2:14 PM   Clinical Narrative:    ED Mini Assessment: What brought you to the Emergency Department? : (P) Pt stated that he was feeling very weak and tired.  Barriers to Discharge: (P) No Barriers Identified  Barrier interventions: (P) The Pt is is having issues with his home. states the city said it is not in living condition. The Pt gets 1100 a month and is staying in hotels. CSW will add senior living housing to the Pt Chart.  Means of departure: (P) Other (enter comment)  Interventions which prevented an admission or readmission: (P) Homeless Screening    Patient Contact and Communications        ,            CMS Medicare.gov Compare Post Acute Care list provided to:: (P) Patient Choice offered to / list presented to : (P) Patient  Admission diagnosis:  failure to thrive Patient Active Problem List   Diagnosis Date Noted   Paroxysmal atrial fibrillation ([place]) 12/02/2021   Dyspnea on exertion 12/02/2021   Elevated troponin 12/02/2021   Normocytic anemia 12/02/2021   Dyspnea 12/02/2021   Acute blood loss anemia    BRBPR (bright red blood per rectum) 10/01/2021   Atrial fibrillation with RVR ([place]) 10/01/2021   Prediabetes 11/16/2018   PNA (pneumonia) 08/23/2017   Appetite impaired 08/23/2017   Singultus, recent history 08/23/2017   Enlarged prostate on Pelvic CT 08/23/2017   GERD (gastroesophageal reflux disease) 08/22/2017   Hypokalemia 09/24/2016   Cough 01/25/2014   Erectile dysfunction 12/28/2013   HLD (hyperlipidemia) 08/11/2012   HTN (hypertension) 06/29/2012   PCP:  [name], MD Pharmacy:   [id number] ([place]) - [place], [place] [address] [place]  [address] Phone: [phone] Fax: [phone]  [facility] [address] [place] [address] Phone: [phone] Fax: [place] [address] Phone: [phone] Fax: [phone]

## 2021-12-05 NOTE — ED Triage Notes (Signed)
Pt bib ems from home; seen at hospital 3 x this week for complaints of weakness, sob on exertion; states he got "kicked out" of [facility] during previous visit; new diagnosis afib, not taking meds, lives by self, house has no water, has not eaten in 24 hours, says no food at home; called out today for weakness, states he feels like he was going to pass out while walking outside; denies family involvement; a and o x 4, answering questions appropriately with ems; pt incontinent of urine, malodorous; endorses continued dyspnea with exertion;160/90, hr 90 afib, rr 20, 98% RA, CBG 210

## 2021-12-05 NOTE — ED Notes (Signed)
Pt given food and beverage 

## 2021-12-05 NOTE — ED Notes (Addendum)
Pt given taxi voucher; pt a and o x 4, ambulatory with steady gait with no distress

## 2021-12-05 NOTE — ED Notes (Signed)
Patient verbalizes understanding of discharge instructions. Opportunity for questioning and answers were provided. Pt discharged from ED. 

## 2021-12-05 NOTE — ED Provider Notes (Signed)
[facility] EMERGENCY DEPARTMENT Provider Note   CSN: [id number] Arrival date & time: 12/05/21  1127     History  Chief Complaint  Patient presents with   Failure To Thrive   Shortness of Breath   Weakness    Steven Simmons is a 81 y.o. male.  HPI   81 year old male with medical history significant for peptic ulcer disease, GI bleed, HTN, GERD, TIA, atrial fibrillation not on anticoagulation due to history of GI bleed who presents to the emergency department with a chief complaint of shortness of breath.  The patient states that he was recently hospitalized for the same.  He has a new diagnosis of atrial fibrillation.  He states that he has been taking diltiazem at home.  He lives by himself at home and states that he has been evicted.  He currently has no water and is has nothing to eat at home.  He called out today for generalized weakness in the setting of shortness of breath.  He felt like he was going to pass out while walking outside.  He endorses dyspnea on exertion.  He has difficulty taking steps for a prolonged period of time without experiencing dyspnea.  He denies any cough, fever or chills.  He denies any chest pain.  He is requesting food on arrival as he has not had much of anything to eat in the past 24 hours.  Home Medications Prior to Admission medications   Medication Sig Start Date End Date Taking? Authorizing Provider  acetaminophen (TYLENOL) 500 MG tablet Take 1,000 mg by mouth every 6 (six) hours as needed for mild pain.    [provider]  diltiazem (CARDIZEM CD) 120 MG 24 hr capsule Take 1 capsule (120 mg total) by mouth daily. 12/03/21 01/02/22  Barb Merino, MD  pantoprazole (PROTONIX) 40 MG tablet Take 1 tablet (40 mg total) by mouth daily. 12/03/21 01/02/22  Barb Merino, MD  potassium chloride SA (KLOR-CON M) 20 MEQ tablet Take 1 tablet (20 mEq total) by mouth daily for 7 days. 12/03/21 12/10/21  Barb Merino, MD  ranitidine  (ZANTAC) 150 MG tablet Take 1 tablet (150 mg total) by mouth at bedtime. Patient not taking: Reported on 12/09/2018 09/02/17 12/09/18  Willia Craze, NP      Allergies    Ceclor [cefaclor]; Veralipride; Penicillins; Amlodipine; Antihistamines, chlorpheniramine-type; Beta adrenergic blockers; Celebrex [celecoxib]; Ibuprofen; Lipitor [atorvastatin]; and Nutrasweet aspartame [aspartame]    Review of Systems   Review of Systems  Respiratory:  Positive for shortness of breath.   All other systems reviewed and are negative.   Physical Exam Updated Vital Signs BP (!) 140/95   Pulse 83   Temp 98 F (36.7 C) (Oral)   Resp (!) 21   Ht '5\' 11"'$  (1.803 m)   Wt 91.6 kg   SpO2 96%   BMI 28.17 kg/m  Physical Exam Vitals and nursing note reviewed.  Constitutional:      General: He is not in acute distress.    Appearance: He is well-developed.  HENT:     Head: Normocephalic and atraumatic.  Eyes:     Conjunctiva/sclera: Conjunctivae normal.  Neck:     Vascular: No JVD.  Cardiovascular:     Rate and Rhythm: Normal rate and regular rhythm.     Heart sounds: No murmur heard. Pulmonary:     Effort: Pulmonary effort is normal. No respiratory distress.     Breath sounds: Normal breath sounds.  Abdominal:  Palpations: Abdomen is soft.     Tenderness: There is no abdominal tenderness.  Musculoskeletal:        General: No swelling.     Cervical back: Neck supple.     Right lower leg: No edema.     Left lower leg: No edema.  Skin:    General: Skin is warm and dry.     Capillary Refill: Capillary refill takes less than 2 seconds.  Neurological:     Mental Status: He is alert.  Psychiatric:        Mood and Affect: Mood normal.     ED Results / Procedures / Treatments   Labs (all labs ordered are listed, but only abnormal results are displayed) Labs Reviewed  CBC WITH DIFFERENTIAL/PLATELET - Abnormal; Notable for the following components:      Result Value   Hemoglobin 12.4 (*)     HCT 38.7 (*)    Platelets 415 (*)    All other components within normal limits  BASIC METABOLIC PANEL - Abnormal; Notable for the following components:   Potassium 3.4 (*)    Glucose, Bld 102 (*)    All other components within normal limits  BRAIN NATRIURETIC PEPTIDE - Abnormal; Notable for the following components:   B Natriuretic Peptide 447.2 (*)    All other components within normal limits  URINALYSIS, ROUTINE W REFLEX MICROSCOPIC - Abnormal; Notable for the following components:   Color, Urine COLORLESS (*)    Specific Gravity, Urine 1.003 (*)    All other components within normal limits  CBG MONITORING, ED - Abnormal; Notable for the following components:   Glucose-Capillary 174 (*)    All other components within normal limits  TROPONIN I (HIGH SENSITIVITY) - Abnormal; Notable for the following components:   Troponin I (High Sensitivity) 20 (*)    All other components within normal limits  TROPONIN I (HIGH SENSITIVITY) - Abnormal; Notable for the following components:   Troponin I (High Sensitivity) 22 (*)    All other components within normal limits  TROPONIN I (HIGH SENSITIVITY) - Abnormal; Notable for the following components:   Troponin I (High Sensitivity) 24 (*)    All other components within normal limits  RESP PANEL BY RT-PCR (FLU A&B, COVID) [id number]  MAGNESIUM    EKG EKG Interpretation  Date/Time:  Saturday December 05 2021 11:39:52 EDT Ventricular Rate:  91 PR Interval:    QRS Duration: 89 QT Interval:  370 QTC Calculation: 456 R Axis:   26 Text Interpretation: Atrial fibrillation Ventricular premature complex Confirmed by Regan Lemming ([id number]) on 12/05/2021 12:45:05 PM  Radiology DG Chest Portable 1 View  Result Date: 12/05/2021 CLINICAL DATA:  Cardiac arrhythmia, atrial fibrillation, shortness of breath on exertion EXAM: PORTABLE CHEST 1 VIEW COMPARISON:  12/03/2021 FINDINGS: Transverse diameter of heart is increased. There are no signs of pulmonary edema  or focal pulmonary consolidation. There is interval clearing of small bilateral pleural effusions. There is no pneumothorax. IMPRESSION: Interval clearing of small pleural effusions. There are no signs of pulmonary edema or focal pulmonary consolidation. Electronically Signed   By: Elmer Picker M.D.   On: 12/05/2021 13:39   DG Chest 2 View  Result Date: 12/03/2021 CLINICAL DATA:  Shortness of breath EXAM: CHEST - 2 VIEW COMPARISON:  Chest x-ray 12/02/2021 FINDINGS: There are trace bilateral pleural effusions. There is no focal lung infiltrate, pleural effusion or pneumothorax. The cardiomediastinal silhouette is stable, the heart is mildly enlarged. No acute fractures are seen. IMPRESSION: 1.  Trace bilateral pleural effusions. 2. Mild cardiomegaly. Electronically Signed   By: Ronney Asters M.D.   On: 12/03/2021 23:33    Procedures Procedures    Medications Ordered in ED Medications  diltiazem (CARDIZEM CD) 24 hr capsule 120 mg (120 mg Oral Given 12/05/21 1817)  potassium chloride SA (KLOR-CON M) CR tablet 20 mEq (20 mEq Oral Given 12/05/21 1503)    ED Course/ Medical Decision Making/ A&P                           Medical Decision Making Amount and/or Complexity of Data Reviewed Labs: ordered. Radiology: ordered.  Risk Prescription drug management.   81 year old male with medical history significant for peptic ulcer disease, GI bleed, HTN, GERD, TIA, atrial fibrillation not on anticoagulation due to history of GI bleed who presents to the emergency department with a chief complaint of shortness of breath.  The patient states that he was recently hospitalized for the same.  He has a new diagnosis of atrial fibrillation.  He states that he has been taking diltiazem at home.  He lives by himself at home and states that he has been evicted.  He currently has no water and is has nothing to eat at home.  He called out today for generalized weakness in the setting of shortness of breath.  He  felt like he was going to pass out while walking outside.  He endorses dyspnea on exertion.  He has difficulty taking steps for a prolonged period of time without experiencing dyspnea.  He denies any cough, fever or chills.  He denies any chest pain.  He is requesting food on arrival as he has not had much of anything to eat in the past 24 hours.  On arrival, the patient was afebrile, hemodynamically stable, not tachycardic, mildly tachypneic, saturating well on room air.  The patient was resting comfortably with no tachypnea on my examination.  He presents in atrial fibrillation chronically, not in RVR.  No clear history of CHF with a recent echocardiogram performed without evidence of CHF.  Patient presenting with dyspnea in the setting of atrial fibrillation.  Additional differential diagnosis includes COVID-19, pneumonia, anemia, uncontrolled atrial fibrillation resulting in pulmonary edema.  On exam, the patient had no pulmonary crackles, no JVD, no lower extremity edema.  For ACS.  The patient denies any active chest pain.  His EKG revealed atrial fibrillation, ventricular rate 91, PVCs present, no ischemic changes noted.  A chest x-ray was performed which revealed interval clearing of small pleural effusions with no signs of pulmonary edema or focal pulmonary consolidation.  This is an improvement compared to a chest x-ray performed on 12/03/2021 when the patient was seen in the emergency department and subsequently discharged.  The patient endorses significant social barriers to health to include housing insecurity, food insecurity, financial insecurity.  TOC/social work consult placed for further assistance.  Patient has not taken his home diltiazem today, this was ordered with subsequent improvement in the patient's heart rate from the 80s and 90s to the 60s and 70s.  His tachypnea also improved.  Blood pressures climbed to as high as 578/469 systolic but subsequently improved to 159/88 following  administration of his home blood pressure medications.  He remained without an oxygen requirement in the emergency department with no evidence of pulmonary edema with improved chest x-ray.  No evidence of RVR as the patient remained rate controlled in the emergency department throughout his time.  Laboratory evaluation significant for COVID-19 influenza PCR testing negative, magnesium normal, troponins mildly elevated but flat and appear to be persistently mildly elevated at baseline between 20 and 24, urinalysis without evidence of UTI, CBC with no leukocytosis, stable anemia to 12.4, CBG normal, BNP nonspecifically moderately elevated to 447 without any comparison.  The patient denies any chest pain.  Low concern for ACS or PE at this time.  Patient presenting with likely mild symptoms associated with his chronic atrial fibrillation.  No clear indication for inpatient hospitalization at this time.  Chest x-ray without evidence of pneumonia, pneumothorax or pulmonary edema.  The patient was monitored for several hours without respiratory distress, hypoxia or evidence of atrial fibrillation with RVR.  He is tolerating oral intake.  No indication for admission at this time.  Advised the patient follow-up with his PCP outpatient and A-fib clinic.  Resources for shelters provided to include senior housing contact number which had been discussed with social work in consultation.  Stable for discharge.  Final Clinical Impression(s) / ED Diagnoses Final diagnoses:  Chronic atrial fibrillation ([place])  Financial insecurity  Food insecurity  Housing insecurity  Shortness of breath    Rx / DC Orders ED Discharge Orders     None         Regan Lemming, MD 12/05/21 1845

## 2021-12-05 NOTE — Discharge Instructions (Addendum)
Continue taking your Diltiazem as prescribed.  Shelter   Waiting List is currently OPEN for Project Based Vouchers (must be 41 years or older to apply)  Please apply  For more information on senior housing contact : [phone]

## 2021-12-08 ENCOUNTER — Emergency Department (HOSPITAL_COMMUNITY): Payer: Medicare Other

## 2021-12-08 ENCOUNTER — Encounter: Payer: Self-pay | Admitting: Family Medicine

## 2021-12-08 ENCOUNTER — Other Ambulatory Visit: Payer: Self-pay | Admitting: Family Medicine

## 2021-12-08 ENCOUNTER — Other Ambulatory Visit: Payer: Self-pay

## 2021-12-08 ENCOUNTER — Emergency Department (HOSPITAL_COMMUNITY)
Admission: EM | Admit: 2021-12-08 | Discharge: 2021-12-08 | Disposition: A | Discharge status: Home / Self Care | Payer: Medicare Other | Attending: Emergency Medicine | Admitting: Emergency Medicine

## 2021-12-08 DIAGNOSIS — Z5900 Homelessness unspecified: Secondary | ICD-10-CM | POA: Insufficient documentation

## 2021-12-08 DIAGNOSIS — R509 Fever, unspecified: Secondary | ICD-10-CM | POA: Diagnosis not present

## 2021-12-08 DIAGNOSIS — E876 Hypokalemia: Secondary | ICD-10-CM | POA: Diagnosis not present

## 2021-12-08 DIAGNOSIS — K5900 Constipation, unspecified: Secondary | ICD-10-CM | POA: Insufficient documentation

## 2021-12-08 DIAGNOSIS — Z59819 Housing instability, housed unspecified: Secondary | ICD-10-CM

## 2021-12-08 DIAGNOSIS — Z5986 Financial insecurity: Secondary | ICD-10-CM

## 2021-12-08 DIAGNOSIS — Z8711 Personal history of peptic ulcer disease: Secondary | ICD-10-CM | POA: Diagnosis not present

## 2021-12-08 DIAGNOSIS — Z59869 Financial insecurity, unspecified: Secondary | ICD-10-CM | POA: Insufficient documentation

## 2021-12-08 DIAGNOSIS — R631 Polydipsia: Secondary | ICD-10-CM | POA: Diagnosis not present

## 2021-12-08 DIAGNOSIS — R1084 Generalized abdominal pain: Secondary | ICD-10-CM | POA: Diagnosis not present

## 2021-12-08 DIAGNOSIS — I10 Essential (primary) hypertension: Secondary | ICD-10-CM | POA: Diagnosis not present

## 2021-12-08 DIAGNOSIS — M47816 Spondylosis without myelopathy or radiculopathy, lumbar region: Secondary | ICD-10-CM | POA: Diagnosis not present

## 2021-12-08 LAB — URINALYSIS, ROUTINE W REFLEX MICROSCOPIC
Bilirubin Urine: NEGATIVE
Glucose, UA: NEGATIVE mg/dL
Hgb urine dipstick: NEGATIVE
Ketones, ur: NEGATIVE mg/dL
Leukocytes,Ua: NEGATIVE
Nitrite: NEGATIVE
Protein, ur: NEGATIVE mg/dL
Specific Gravity, Urine: 1.011 (ref 1.005–1.030)
pH: 7 (ref 5.0–8.0)

## 2021-12-08 LAB — COMPREHENSIVE METABOLIC PANEL
ALT: 18 U/L (ref 0–44)
AST: 20 U/L (ref 15–41)
Albumin: 3.2 g/dL — ABNORMAL LOW (ref 3.5–5.0)
Alkaline Phosphatase: 86 U/L (ref 38–126)
Anion gap: 9 (ref 5–15)
BUN: 13 mg/dL (ref 8–23)
CO2: 26 mmol/L (ref 22–32)
Calcium: 9.2 mg/dL (ref 8.9–10.3)
Chloride: 104 mmol/L (ref 98–111)
Creatinine, Ser: 1.13 mg/dL (ref 0.61–1.24)
GFR, Estimated: >60 mL/min (ref >60)
Glucose, Bld: 144 mg/dL — ABNORMAL HIGH (ref 70–99)
Potassium: 3.4 mmol/L — ABNORMAL LOW (ref 3.5–5.1)
Sodium: 139 mmol/L (ref 135–145)
Total Bilirubin: 0.7 mg/dL (ref 0.3–1.2)
Total Protein: 6.9 g/dL (ref 6.5–8.1)

## 2021-12-08 LAB — CBC WITH DIFFERENTIAL/PLATELET
Abs Immature Granulocytes: 0.07 10*3/uL (ref 0.00–0.07)
Basophils Absolute: 0 10*3/uL (ref 0.0–0.1)
Basophils Relative: 0 %
Eosinophils Absolute: 0.1 10*3/uL (ref 0.0–0.5)
Eosinophils Relative: 2 %
HCT: 38.6 % — ABNORMAL LOW (ref 39.0–52.0)
Hemoglobin: 12.5 g/dL — ABNORMAL LOW (ref 13.0–17.0)
Immature Granulocytes: 1 %
Lymphocytes Relative: 16 %
Lymphs Abs: 1.2 10*3/uL (ref 0.7–4.0)
MCH: 28.9 pg (ref 26.0–34.0)
MCHC: 32.4 g/dL (ref 30.0–36.0)
MCV: 89.4 fL (ref 80.0–100.0)
Monocytes Absolute: 0.4 10*3/uL (ref 0.1–1.0)
Monocytes Relative: 5 %
Neutro Abs: 5.7 10*3/uL (ref 1.7–7.7)
Neutrophils Relative %: 76 %
Platelets: 422 10*3/uL — ABNORMAL HIGH (ref 150–400)
RBC: 4.32 MIL/uL (ref 4.22–5.81)
RDW: 13.5 % (ref 11.5–15.5)
WBC: 7.5 10*3/uL (ref 4.0–10.5)
nRBC: 0 % (ref 0.0–0.2)

## 2021-12-08 LAB — TROPONIN I (HIGH SENSITIVITY)
Troponin I (High Sensitivity): 19 ng/L — ABNORMAL HIGH (ref <18)
Troponin I (High Sensitivity): 17 ng/L (ref <18)

## 2021-12-08 MED ORDER — POLYETHYLENE GLYCOL 3350 17 G PO PACK: 17.0000 g | PACK | Freq: Every day | ORAL | 0 refills | Status: DC

## 2021-12-08 MED ORDER — SODIUM CHLORIDE 0.9 % IV BOLUS
1000.0000 mL | Freq: Once | INTRAVENOUS | Status: AC
  Administered 2021-12-08: 1000 mL via INTRAVENOUS

## 2021-12-08 MED ORDER — MAGNESIUM CITRATE PO SOLN
1.0000 | Freq: Once | ORAL | Status: AC
  Administered 2021-12-08: 1 via ORAL
  Filled 2021-12-08: qty 296

## 2021-12-08 MED ORDER — MINERAL OIL RE ENEM
1.0000 | ENEMA | Freq: Once | RECTAL | Status: AC
  Administered 2021-12-08: 1 via RECTAL
  Filled 2021-12-08: qty 1

## 2021-12-08 NOTE — Progress Notes (Signed)
Read in ED providers' note forwarded to me (PCP) that patient is currently suffering from housing and food insecurities. Will refer to community care coordination.   Steven Essex, MD

## 2021-12-08 NOTE — ED Notes (Signed)
RN notified by CSW that patient will need taxi voucher when discharged and it is approved by her.

## 2021-12-08 NOTE — ED Notes (Signed)
Pt ambulated to the bathroom. Pt is able to walk without assistance.

## 2021-12-08 NOTE — ED Provider Notes (Signed)
[facility] EMERGENCY DEPARTMENT Provider Note   CSN: [id number] Arrival date & time: 12/08/21  8099     History  Chief Complaint  Patient presents with   Constipation   Polyuria   Abdominal Pain    Steven Simmons is a 81 y.o. male history of A-fib on Cardizem but not on anticoagulation due to GI bleed, here presenting with constipation and increased thirstiness.  Patient states that he was seen here 3 days ago.  His potassium was low and he was started on potassium and Protonix and he has been taking them.  He states that for the last several days he had no bowel movement.  He states that he also has been having abdominal cramps.  Patient also feels very thirsty and tired all the time.  He has an unstable housing situation.  He states that he has a place to stay right now but he was concerned that he may be kicked out.  He is already reach out to social services about getting places to stay.  He initially told triage that he has some arm numbness and tingling but he states that that has resolved.  Patient was recently admitted for GI bleed and has been back and forth in the ER last several days.  The history is provided by the patient.       Home Medications Prior to Admission medications   Medication Sig Start Date End Date Taking? Authorizing Provider  acetaminophen (TYLENOL) 500 MG tablet Take 1,000 mg by mouth every 6 (six) hours as needed for mild pain.    [provider]  diltiazem (CARDIZEM CD) 120 MG 24 hr capsule Take 1 capsule (120 mg total) by mouth daily. 12/03/21 01/02/22  Barb Merino, MD  pantoprazole (PROTONIX) 40 MG tablet Take 1 tablet (40 mg total) by mouth daily. 12/03/21 01/02/22  Barb Merino, MD  potassium chloride SA (KLOR-CON M) 20 MEQ tablet Take 1 tablet (20 mEq total) by mouth daily for 7 days. 12/03/21 12/10/21  Barb Merino, MD  ranitidine (ZANTAC) 150 MG tablet Take 1 tablet (150 mg total) by mouth at bedtime. Patient not  taking: Reported on 12/09/2018 09/02/17 12/09/18  Willia Craze, NP      Allergies    Ceclor [cefaclor]; Veralipride; Penicillins; Amlodipine; Antihistamines, chlorpheniramine-type; Beta adrenergic blockers; Celebrex [celecoxib]; Ibuprofen; Lipitor [atorvastatin]; and Nutrasweet aspartame [aspartame]    Review of Systems   Review of Systems  Gastrointestinal:  Positive for abdominal pain and constipation.  All other systems reviewed and are negative.   Physical Exam Updated Vital Signs BP (!) 161/89 (BP Location: Right Arm)   Pulse 66   Temp 97.9 F (36.6 C)   Resp 14   SpO2 98%  Physical Exam Vitals and nursing note reviewed.  Constitutional:      Comments: Chronically ill and disheveled and slightly dehydrated  HENT:     Head: Normocephalic.     Mouth/Throat:     Mouth: Mucous membranes are moist.  Eyes:     Extraocular Movements: Extraocular movements intact.     Pupils: Pupils are equal, round, and reactive to light.  Cardiovascular:     Rate and Rhythm: Normal rate and regular rhythm.     Heart sounds: Normal heart sounds.  Pulmonary:     Effort: Pulmonary effort is normal.     Breath sounds: Normal breath sounds.  Abdominal:     General: Abdomen is flat.  Skin:    Capillary Refill: Capillary refill takes  less than 2 seconds.  Neurological:     General: No focal deficit present.     Mental Status: He is oriented to person, place, and time.  Psychiatric:        Mood and Affect: Mood normal.        Behavior: Behavior normal.     ED Results / Procedures / Treatments   Labs (all labs ordered are listed, but only abnormal results are displayed) Labs Reviewed  CBC WITH DIFFERENTIAL/PLATELET - Abnormal; Notable for the following components:      Result Value   Hemoglobin 12.5 (*)    HCT 38.6 (*)    Platelets 422 (*)    All other components within normal limits  COMPREHENSIVE METABOLIC PANEL - Abnormal; Notable for the following components:   Potassium 3.4 (*)     Glucose, Bld 144 (*)    Albumin 3.2 (*)    All other components within normal limits  TROPONIN I (HIGH SENSITIVITY) - Abnormal; Notable for the following components:   Troponin I (High Sensitivity) 19 (*)    All other components within normal limits  URINALYSIS, ROUTINE W REFLEX MICROSCOPIC  TROPONIN I (HIGH SENSITIVITY)    EKG None  Radiology No results found.  Procedures Procedures    Medications Ordered in ED Medications  sodium chloride 0.9 % bolus 1,000 mL (has no administration in time range)  magnesium citrate solution 1 Bottle (has no administration in time range)    ED Course/ Medical Decision Making/ A&P                           Medical Decision Making Steven Simmons is a 81 y.o. male here presenting with multiple complaints.  Patient has similar complaints and has been in and out of the hospital last several weeks.  I think this is probably related to his unstable housing situation.  Patient had a recent GI bleed.  Patient's hemoglobin is stable right now.  Patient is here with some constipation likely secondary to potassium use, he just had a recent CT abdomen pelvis so I will get a acute abdominal series.  We will check labs and urinalysis.  We will hydrate patient and reassess.  6:20 PM Labs show potassium of 3.4.  Patient did not have a bowel movement after magnesium citrate but was able to have a bowel movement after given mineral oil.  X-ray just showed constipation.  Patient tolerated p.o. in the ED.  Patient stable for discharge.  Amount and/or Complexity of Data Reviewed Labs: ordered. Decision-making details documented in ED Course. Radiology: ordered and independent interpretation performed. Decision-making details documented in ED Course.  Risk OTC drugs.    Final Clinical Impression(s) / ED Diagnoses Final diagnoses:  None    Rx / DC Orders ED Discharge Orders     None         Drenda Freeze, MD 12/08/21 1821

## 2021-12-08 NOTE — ED Notes (Signed)
Patient provided with instructions on how to drink magnesium patient reports understanding and verbalizes her will do such

## 2021-12-08 NOTE — Discharge Instructions (Addendum)
You have constipation and you should take MiraLAX daily  Please go to a shelter  Return to ER if you have worse abdominal pain, vomiting, fever.

## 2021-12-08 NOTE — ED Notes (Signed)
Pt given a sandwich bag with water.

## 2021-12-08 NOTE — ED Notes (Signed)
Patient transported to X-ray 

## 2021-12-08 NOTE — ED Provider Triage Note (Signed)
Emergency Medicine Provider Triage Evaluation Note  Steven Simmons , a 81 y.o. male  was evaluated in triage.  He has a history of peptic ulcer disease, GI bleed, HTN, GERD, TIA, atrial fibrillation not on anticoagulation due to history of GI bleed, recent ED visit 9/2. Pt has multiple complaints today.  Reports that he has had constipation for the past 3 days with some mild abdominal pain.  Also reports severe increased thirst and urination.  He states that his hands feel numb.  He shows me the medications that he is currently on including pantoprazole, diltiazem, and a potassium supplement.  Review of Systems  Positive: Polydipsia, urinary frequency Negative: Fever  Physical Exam  There were no vitals taken for this visit. Gen:   Awake, no distress   Resp:  Normal effort  MSK:   Moves extremities without difficulty  Other:  Abdomen soft, nontender.  Medical Decision Making  Medically screening exam initiated at 9:20 AM.  Appropriate orders placed.  [name] was informed that the remainder of the evaluation will be completed by another provider, this initial triage assessment does not replace that evaluation, and the importance of remaining in the ED until their evaluation is complete.     Carlisle Cater, PA-C 12/08/21 [phone]

## 2021-12-08 NOTE — ED Triage Notes (Signed)
EMS stated, coming from home with abdominal pain and constipation for 3 days and having episodes of more urinating than usual. Started a new medication .

## 2021-12-08 NOTE — ED Notes (Signed)
Patient reports that the magnesium did not help him go to the restroom that his stool is too hard. Provider made aware

## 2021-12-16 ENCOUNTER — Other Ambulatory Visit: Payer: Self-pay | Admitting: Family Medicine

## 2021-12-25 ENCOUNTER — Ambulatory Visit: Payer: Medicare Other | Admitting: Family Medicine

## 2022-01-11 ENCOUNTER — Other Ambulatory Visit: Payer: Self-pay

## 2022-01-11 ENCOUNTER — Emergency Department (HOSPITAL_COMMUNITY): Payer: Medicare Other

## 2022-01-11 ENCOUNTER — Emergency Department (HOSPITAL_COMMUNITY)
Admission: EM | Admit: 2022-01-11 | Discharge: 2022-01-11 | Disposition: A | Discharge status: Home / Self Care | Payer: Medicare Other | Attending: Emergency Medicine | Admitting: Emergency Medicine

## 2022-01-11 ENCOUNTER — Other Ambulatory Visit (HOSPITAL_COMMUNITY): Payer: Self-pay

## 2022-01-11 ENCOUNTER — Encounter (HOSPITAL_COMMUNITY): Payer: Self-pay

## 2022-01-11 DIAGNOSIS — R059 Cough, unspecified: Secondary | ICD-10-CM | POA: Diagnosis not present

## 2022-01-11 DIAGNOSIS — I10 Essential (primary) hypertension: Secondary | ICD-10-CM | POA: Insufficient documentation

## 2022-01-11 DIAGNOSIS — I499 Cardiac arrhythmia, unspecified: Secondary | ICD-10-CM | POA: Insufficient documentation

## 2022-01-11 DIAGNOSIS — R0600 Dyspnea, unspecified: Secondary | ICD-10-CM | POA: Diagnosis not present

## 2022-01-11 DIAGNOSIS — Z20822 Contact with and (suspected) exposure to covid-19: Secondary | ICD-10-CM | POA: Diagnosis not present

## 2022-01-11 DIAGNOSIS — B9789 Other viral agents as the cause of diseases classified elsewhere: Secondary | ICD-10-CM | POA: Diagnosis not present

## 2022-01-11 DIAGNOSIS — R069 Unspecified abnormalities of breathing: Secondary | ICD-10-CM | POA: Diagnosis not present

## 2022-01-11 DIAGNOSIS — J069 Acute upper respiratory infection, unspecified: Secondary | ICD-10-CM | POA: Insufficient documentation

## 2022-01-11 DIAGNOSIS — J811 Chronic pulmonary edema: Secondary | ICD-10-CM | POA: Diagnosis not present

## 2022-01-11 LAB — RESP PANEL BY RT-PCR (FLU A&B, COVID) ARPGX2
Influenza A by PCR: NEGATIVE
Influenza B by PCR: NEGATIVE
SARS Coronavirus 2 by RT PCR: NEGATIVE

## 2022-01-11 MED ORDER — ALBUTEROL SULFATE HFA 108 (90 BASE) MCG/ACT IN AERS
2.0000 | INHALATION_SPRAY | Freq: Once | RESPIRATORY_TRACT | Status: AC
  Administered 2022-01-11: 2 via RESPIRATORY_TRACT
  Filled 2022-01-11: qty 6.7

## 2022-01-11 MED ORDER — BENZONATATE 100 MG PO CAPS
100.0000 mg | ORAL_CAPSULE | Freq: Three times a day (TID) | ORAL | 0 refills | Status: DC
  Filled 2022-01-11: qty 21, 7d supply, fill #0

## 2022-01-11 MED ORDER — BENZONATATE 100 MG PO CAPS
100.0000 mg | ORAL_CAPSULE | Freq: Once | ORAL | Status: AC
  Administered 2022-01-11: 100 mg via ORAL
  Filled 2022-01-11: qty 1

## 2022-01-11 NOTE — ED Notes (Signed)
Pt requests taxi for transportation home.

## 2022-01-11 NOTE — ED Triage Notes (Signed)
Patient c/o a productive cough and chest congestion x 2 days.

## 2022-01-11 NOTE — ED Provider Notes (Signed)
[facility] DEPT Provider Note   CSN: [id number] Arrival date & time: 01/11/22  1028     History  Chief Complaint  Patient presents with   Cough   chest congestion    Steven Simmons is a 81 y.o. male with a past medical history of hypertension, hyperlipidemia and A-fib presenting today due to cough and chest congestion.  Reports that for the past 2 days he has been feeling "bad."  Describes it as fatigue.  No shortness of breath but does report that he has a cough that is occasionally productive of thick sputum.  No known sick contacts   Cough Associated symptoms: chills   Associated symptoms: no chest pain, no fever and no shortness of breath        Home Medications Prior to Admission medications   Medication Sig Start Date End Date Taking? Authorizing Provider  acetaminophen (TYLENOL) 500 MG tablet Take 1,000 mg by mouth every 6 (six) hours as needed for mild pain.    [provider]  diltiazem (CARDIZEM CD) 120 MG 24 hr capsule Take 1 capsule (120 mg total) by mouth daily. 12/03/21 01/02/22  Barb Merino, MD  pantoprazole (PROTONIX) 40 MG tablet Take 1 tablet (40 mg total) by mouth daily. 12/03/21 01/02/22  Barb Merino, MD  polyethylene glycol (MIRALAX / GLYCOLAX) 17 g packet Take 17 g by mouth daily. 12/08/21   Drenda Freeze, MD  potassium chloride SA (KLOR-CON M) 20 MEQ tablet Take 1 tablet (20 mEq total) by mouth daily for 7 days. 12/03/21 12/10/21  Barb Merino, MD  ranitidine (ZANTAC) 150 MG tablet Take 1 tablet (150 mg total) by mouth at bedtime. Patient not taking: Reported on 12/09/2018 09/02/17 12/09/18  Willia Craze, NP      Allergies    Ceclor [cefaclor]; Veralipride; Penicillins; Amlodipine; Antihistamines, chlorpheniramine-type; Beta adrenergic blockers; Celebrex [celecoxib]; Ibuprofen; Lipitor [atorvastatin]; and Nutrasweet aspartame [aspartame]    Review of Systems   Review of Systems  Constitutional:  Positive for  chills. Negative for fever.  HENT:  Positive for congestion.   Respiratory:  Positive for cough. Negative for shortness of breath.   Cardiovascular:  Negative for chest pain and palpitations.    Physical Exam Updated Vital Signs BP (!) 148/77 (BP Location: Left Arm)   Temp 98.1 F (36.7 C) (Oral)   Resp 18   Ht 6' (1.829 m)   Wt 90.7 kg   SpO2 97%   BMI 27.12 kg/m  Physical Exam Vitals and nursing note reviewed.  Constitutional:      General: He is not in acute distress.    Appearance: He is not ill-appearing.     Comments: Unkempt  HENT:     Head: Normocephalic and atraumatic.     Mouth/Throat:     Mouth: Mucous membranes are dry.     Pharynx: Oropharynx is clear.  Eyes:     General: No scleral icterus.    Conjunctiva/sclera: Conjunctivae normal.  Cardiovascular:     Rate and Rhythm: Normal rate. Rhythm irregular.     Pulses: Normal pulses.  Pulmonary:     Effort: Pulmonary effort is normal. No respiratory distress.     Breath sounds: Wheezing (RLL and LLL) present.  Skin:    General: Skin is warm and dry.     Findings: No rash.  Neurological:     Mental Status: He is alert.  Psychiatric:        Mood and Affect: Mood normal.  Behavior: Behavior normal.     ED Results / Procedures / Treatments   Labs (all labs ordered are listed, but only abnormal results are displayed) Labs Reviewed  RESP PANEL BY RT-PCR (FLU A&B, COVID) [id number]    EKG None  Radiology DG Chest 2 View  Result Date: 01/11/2022 CLINICAL DATA:  Dyspnea and productive cough EXAM: CHEST - 2 VIEW COMPARISON:  Radiograph 12/05/2021 FINDINGS: Stable cardiomegaly. Aortic atherosclerotic calcification. Mild pulmonary vascular congestion. Left basilar airspace opacities, likely atelectasis. No pleural effusion or pneumothorax. No acute osseous abnormality. IMPRESSION: No acute cardiopulmonary process. Mild cardiomegaly. Electronically Signed   By: Placido Sou M.D.   On: 01/11/2022 11:18     Procedures Procedures   Medications Ordered in ED Medications  benzonatate (TESSALON) capsule 100 mg (100 mg Oral Given 01/11/22 1131)  albuterol (VENTOLIN HFA) 108 (90 Base) MCG/ACT inhaler 2 puff (2 puffs Inhalation Given 01/11/22 1204)    ED Course/ Medical Decision Making/ A&P                           Medical Decision Making Amount and/or Complexity of Data Reviewed Radiology: ordered.  Risk Prescription drug management.   This is a 81 year old male presenting today with URI symptoms.  Differential includes but is not limited to COVID, flu, pneumonia, other viral illness and PE   This is not an exhaustive differential.    Past Medical History / Co-morbidities / Social History: Housing insecurity   Additional history: Per chart review patient has permanent A-fib.  He is in A-fib today with a normal rate   Physical Exam: Pertinent physical exam findings include A-fib, per chart review this is his baseline  Lab Tests: I ordered, and personally interpreted labs.  The pertinent results include: Negative COVID and flu   Imaging Studies: I ordered and independently visualized and interpreted chest x-ray and I agree with the radiologist that no abnormalities    Medications: I ordered medication including albuterol inhaler. Reevaluation of the patient after these medicines showed that the patient improved.    MDM/Disposition: This is a 81 year old male presented today due to "feeling bad" and cough and congestion.  COVID, flu and chest x-ray were within normal limits.  He did have mild wheezing in his lower lung fields and was treated with an albuterol inhaler that he may keep.  I have sent Tessalon Perles to his pharmacy for his cough and he will be discharged home at this time.  Oxygen saturations within normal limits   Final Clinical Impression(s) / ED Diagnoses Final diagnoses:  Viral URI with cough    Rx / DC Orders ED Discharge Orders          Ordered     benzonatate (TESSALON) 100 MG capsule  Every 8 hours        01/11/22 1200           Results and diagnoses were explained to the patient. Return precautions discussed in full. Patient had no additional questions and expressed complete understanding.   This chart was dictated using voice recognition software.  Despite best efforts to proofread,  errors can occur which can change the documentation meaning.    Rhae Hammock, PA-C 01/11/22 1207    Sherwood Gambler, MD 01/11/22 1422

## 2022-01-11 NOTE — Discharge Instructions (Addendum)
Your COVID and flu are normal today.  Your chest x-ray is also within normal limits.  Use your albuterol inhaler as needed for any chest tightness.  Mucinex is a good medication for chest congestion.  I have sent cough pills to your pharmacy.  Please return with any worsening symptoms.

## 2022-01-18 ENCOUNTER — Other Ambulatory Visit (HOSPITAL_COMMUNITY): Payer: Self-pay

## 2022-01-18 ENCOUNTER — Encounter: Payer: Self-pay | Admitting: Student

## 2022-01-18 ENCOUNTER — Ambulatory Visit (INDEPENDENT_AMBULATORY_CARE_PROVIDER_SITE_OTHER): Payer: Medicare Other | Admitting: Student

## 2022-01-18 VITALS — BP 158/104 | HR 81 | Ht 72.0 in | Wt 205.6 lb

## 2022-01-18 DIAGNOSIS — I10 Essential (primary) hypertension: Secondary | ICD-10-CM

## 2022-01-18 DIAGNOSIS — K219 Gastro-esophageal reflux disease without esophagitis: Secondary | ICD-10-CM

## 2022-01-18 DIAGNOSIS — Z59819 Housing instability, housed unspecified: Secondary | ICD-10-CM | POA: Diagnosis not present

## 2022-01-18 DIAGNOSIS — I4891 Unspecified atrial fibrillation: Secondary | ICD-10-CM | POA: Diagnosis not present

## 2022-01-18 MED ORDER — POTASSIUM CHLORIDE CRYS ER 20 MEQ PO TBCR
20.0000 meq | EXTENDED_RELEASE_TABLET | Freq: Every day | ORAL | 0 refills | Status: DC
  Filled 2022-01-18: qty 7, 7d supply, fill #0

## 2022-01-18 MED ORDER — PANTOPRAZOLE SODIUM 40 MG PO TBEC
40.0000 mg | DELAYED_RELEASE_TABLET | Freq: Every day | ORAL | 0 refills | Status: DC
  Filled 2022-01-18: qty 30, 30d supply, fill #0

## 2022-01-18 MED ORDER — DILTIAZEM HCL ER COATED BEADS 120 MG PO CP24
120.0000 mg | ORAL_CAPSULE | Freq: Every day | ORAL | 0 refills | Status: DC
  Filled 2022-01-18: qty 30, 30d supply, fill #0

## 2022-01-18 NOTE — Assessment & Plan Note (Signed)
Disheveled appearance, significant housing and financial instability and uncontrolled chronic medical conditions warranting consideration for assisted living.  FL 2 form completed and placed in my inbox.  I have called [facility] of placement services and advising her to call back and leave the fax number so that we can get this information over to her. Additionally placed referral to chronic care management services for housing insecurity.

## 2022-01-18 NOTE — Progress Notes (Signed)
  SUBJECTIVE:   CHIEF COMPLAINT / HPI:   Housing instability: Patient presents today regarding his housing instability and desire to be placed into assisted living.  States he is in contact with [facility] services to get him into a nursing home/facility.  He currently lives at home alone in a house that he owns that was built in [place].  States the house is not up-to-date and it is a dump.  He cannot afford to fix anything in the house.  He is able to cook but none of the appliances in his home work.  Additionally his water does not work and he has not bathed since his last hospitalization (upon chart review late August).  States he uses [employer] for food.  The house is going to be condemned at some point.  Chronic medical conditions consist of sciatica, A-fib, HTN, HLD.  His point of contact for placement services his chest in the [facility] who can be reached at [phone].  He did confirm his own telephone number of [phone].  And presents with FL 2 form.  PERTINENT  PMH / PSH: HTN, HLD, GERD, A-fib  OBJECTIVE:  BP (!) 158/104   Pulse 81   Ht 6' (1.829 m)   Wt 205 lb 9.6 oz (93.3 kg)   SpO2 97%   BMI 27.88 kg/m  General: Alert and oriented, disheveled appearance wearing worn clothes CV: Irregularly irregular, no murmurs appreciated Skin: Dirt appreciable on all surfaces (hands, face, chest)  ASSESSMENT/PLAN:  Housing instability Assessment & Plan: Disheveled appearance, significant housing and financial instability and uncontrolled chronic medical conditions warranting consideration for assisted living.  FL 2 form completed and placed in my inbox.  I have called [facility] of placement services and advising her to call back and leave the fax number so that we can get this information over to her. Additionally placed referral to chronic care management services for housing insecurity.  Orders: -     AMB Referral to Chronic Care Management Services  Primary  hypertension Assessment & Plan: BP: (!) 158/104 today. Poorly controlled. Goal of <130/80.  Not taking medications daily.  Refilled today.  Follow-up with PCP. Medication regimen: Diltiazem 120 mg daily   Atrial fibrillation with RVR ([place]) Assessment & Plan: Rate controlled today, refilled medications since he is likely not taking them daily.  Orders: -     dilTIAZem HCl ER Coated Beads; Take 1 capsule (120 mg total) by mouth daily.  Dispense: 30 capsule; Refill: 0 -     Potassium Chloride Crys ER; Take 1 tablet (20 mEq total) by mouth daily for 7 days.  Dispense: 7 tablet; Refill: 0  Gastroesophageal reflux disease, unspecified whether esophagitis present -     Pantoprazole Sodium; Take 1 tablet (40 mg total) by mouth daily.  Dispense: 30 tablet; Refill: 0  Return in about 4 weeks (around 02/15/2022) for HTN follow-up. Wells Guiles, DO 01/18/2022, 12:39 PM PGY-2, [place]

## 2022-01-18 NOTE — Assessment & Plan Note (Signed)
BP: (!) 158/104 today. Poorly controlled. Goal of <130/80.  Not taking medications daily.  Refilled today.  Follow-up with PCP. Medication regimen: Diltiazem 120 mg daily

## 2022-01-18 NOTE — Patient Instructions (Addendum)
It was great to see you today! Thank you for choosing [facility] for your primary care. Steven Simmons was seen for [facility] 2 form.  Today we addressed: Housing instability: I have placed a referral to our chronic care management folks so that they may further assist you.  I will complete this FL 2 form so that we are able to get you into a better housing situation.   I would recommend returning regarding your high blood pressure.  I have represcribed your medications, please pick those up if you are able.  If you haven't already, sign up for My Chart to have easy access to your labs results, and communication with your primary care physician.  Call the clinic at [phone] if your symptoms worsen or you have any concerns.  You should return to our clinic Return in about 4 weeks (around 02/15/2022) for HTN follow-up. Please arrive 15 minutes before your appointment to ensure smooth check in process.  We appreciate your efforts in making this happen.  Thank you for allowing me to participate in your care, Wells Guiles, DO 01/18/2022, 11:35 AM PGY-2, [place]

## 2022-01-18 NOTE — Assessment & Plan Note (Signed)
Rate controlled today, refilled medications since he is likely not taking them daily.

## 2022-01-23 ENCOUNTER — Other Ambulatory Visit (HOSPITAL_COMMUNITY): Payer: Self-pay

## 2022-01-29 ENCOUNTER — Telehealth: Payer: Self-pay | Admitting: Family Medicine

## 2022-01-29 NOTE — Telephone Encounter (Signed)
Will check with MD.  Steven Simmons

## 2022-01-29 NOTE — Telephone Encounter (Signed)
Case manager from [facility] called to ask if we could refax patients fl2 paperwork due to her not receiving the complete fax. She stated that she received part of it on 01/21/22 but I do not see it in the previously faxed so I am not able to resend it.   Does anyone know if it was placed somewhere else or if Dr. Jeani Hawking still has it?

## 2022-01-29 NOTE — Telephone Encounter (Signed)
Patient returns call to nurse line regarding missed phone call.   Unsure who was calling patient. Please return call to patient at [phone].  Talbot Grumbling, RN

## 2022-01-31 ENCOUNTER — Emergency Department (HOSPITAL_COMMUNITY)
Admission: EM | Admit: 2022-01-31 | Discharge: 2022-01-31 | Disposition: A | Discharge status: Home / Self Care | Payer: Medicare Other | Attending: Emergency Medicine | Admitting: Emergency Medicine

## 2022-01-31 ENCOUNTER — Other Ambulatory Visit: Payer: Self-pay

## 2022-01-31 ENCOUNTER — Encounter (HOSPITAL_COMMUNITY): Payer: Self-pay

## 2022-01-31 ENCOUNTER — Emergency Department (HOSPITAL_COMMUNITY): Payer: Medicare Other

## 2022-01-31 DIAGNOSIS — J441 Chronic obstructive pulmonary disease with (acute) exacerbation: Secondary | ICD-10-CM | POA: Diagnosis not present

## 2022-01-31 DIAGNOSIS — R0602 Shortness of breath: Secondary | ICD-10-CM | POA: Diagnosis not present

## 2022-01-31 DIAGNOSIS — R059 Cough, unspecified: Secondary | ICD-10-CM | POA: Diagnosis not present

## 2022-01-31 DIAGNOSIS — I10 Essential (primary) hypertension: Secondary | ICD-10-CM | POA: Diagnosis not present

## 2022-01-31 DIAGNOSIS — R0902 Hypoxemia: Secondary | ICD-10-CM | POA: Diagnosis not present

## 2022-01-31 DIAGNOSIS — R000 Tachycardia, unspecified: Secondary | ICD-10-CM | POA: Diagnosis not present

## 2022-01-31 LAB — URINALYSIS, ROUTINE W REFLEX MICROSCOPIC
Bilirubin Urine: NEGATIVE
Glucose, UA: NEGATIVE mg/dL
Hgb urine dipstick: NEGATIVE
Ketones, ur: NEGATIVE mg/dL
Nitrite: NEGATIVE
Specific Gravity, Urine: 1.02 (ref 1.005–1.030)
pH: 6.5 (ref 5.0–8.0)

## 2022-01-31 LAB — CBC WITH DIFFERENTIAL/PLATELET
Abs Immature Granulocytes: 0.07 10*3/uL (ref 0.00–0.07)
Basophils Absolute: 0.1 10*3/uL (ref 0.0–0.1)
Basophils Relative: 1 %
Eosinophils Absolute: 0.1 10*3/uL (ref 0.0–0.5)
Eosinophils Relative: 1 %
HCT: 37.9 % — ABNORMAL LOW (ref 39.0–52.0)
Hemoglobin: 11.8 g/dL — ABNORMAL LOW (ref 13.0–17.0)
Immature Granulocytes: 1 %
Lymphocytes Relative: 9 %
Lymphs Abs: 0.8 10*3/uL (ref 0.7–4.0)
MCH: 27.6 pg (ref 26.0–34.0)
MCHC: 31.1 g/dL (ref 30.0–36.0)
MCV: 88.8 fL (ref 80.0–100.0)
Monocytes Absolute: 0.6 10*3/uL (ref 0.1–1.0)
Monocytes Relative: 6 %
Neutro Abs: 7.9 10*3/uL — ABNORMAL HIGH (ref 1.7–7.7)
Neutrophils Relative %: 82 %
Platelets: 407 10*3/uL — ABNORMAL HIGH (ref 150–400)
RBC: 4.27 MIL/uL (ref 4.22–5.81)
RDW: 15.1 % (ref 11.5–15.5)
WBC: 9.5 10*3/uL (ref 4.0–10.5)
nRBC: 0 % (ref 0.0–0.2)

## 2022-01-31 LAB — BASIC METABOLIC PANEL
Anion gap: 7 (ref 5–15)
BUN: 23 mg/dL (ref 8–23)
CO2: 24 mmol/L (ref 22–32)
Calcium: 8.6 mg/dL — ABNORMAL LOW (ref 8.9–10.3)
Chloride: 104 mmol/L (ref 98–111)
Creatinine, Ser: 0.96 mg/dL (ref 0.61–1.24)
GFR, Estimated: >60 mL/min (ref >60)
Glucose, Bld: 133 mg/dL — ABNORMAL HIGH (ref 70–99)
Potassium: 3.8 mmol/L (ref 3.5–5.1)
Sodium: 135 mmol/L (ref 135–145)

## 2022-01-31 LAB — URINALYSIS, MICROSCOPIC (REFLEX)

## 2022-01-31 MED ORDER — PREDNISONE 20 MG PO TABS: ORAL_TABLET | ORAL | 0 refills | Status: DC

## 2022-01-31 MED ORDER — IPRATROPIUM-ALBUTEROL 0.5-2.5 (3) MG/3ML IN SOLN
3.0000 mL | Freq: Once | RESPIRATORY_TRACT | Status: AC
  Administered 2022-01-31: 3 mL via RESPIRATORY_TRACT
  Filled 2022-01-31: qty 3

## 2022-01-31 MED ORDER — ALBUTEROL SULFATE (2.5 MG/3ML) 0.083% IN NEBU
5.0000 mg | INHALATION_SOLUTION | Freq: Once | RESPIRATORY_TRACT | Status: AC
  Administered 2022-01-31: 5 mg via RESPIRATORY_TRACT
  Filled 2022-01-31: qty 6

## 2022-01-31 MED ORDER — PREDNISONE 20 MG PO TABS
60.0000 mg | ORAL_TABLET | Freq: Once | ORAL | Status: AC
  Administered 2022-01-31: 60 mg via ORAL
  Filled 2022-01-31: qty 3

## 2022-01-31 MED ORDER — IPRATROPIUM BROMIDE 0.02 % IN SOLN
0.5000 mg | Freq: Once | RESPIRATORY_TRACT | Status: AC
  Administered 2022-01-31: 0.5 mg via RESPIRATORY_TRACT
  Filled 2022-01-31 (×2): qty 2.5

## 2022-01-31 MED ORDER — METHYLPREDNISOLONE SODIUM SUCC 125 MG IJ SOLR: 125.0000 mg | Freq: Once | INTRAMUSCULAR | Status: DC

## 2022-01-31 MED ORDER — ALBUTEROL SULFATE HFA 108 (90 BASE) MCG/ACT IN AERS
2.0000 | INHALATION_SPRAY | Freq: Once | RESPIRATORY_TRACT | Status: AC
  Administered 2022-01-31: 2 via RESPIRATORY_TRACT
  Filled 2022-01-31: qty 13.4

## 2022-01-31 NOTE — ED Provider Triage Note (Signed)
Emergency Medicine Provider Triage Evaluation Note  Steven Simmons , a 81 y.o. male  was evaluated in triage.  Pt complains of shortness of breath.  Patient states he woke up this morning feeling shortness of breath.  He had to stop on the way to his bathroom to catch some breath.  Endorses weakness.  Patient normally does not wear oxygen at home.  He is on 2 L [name] in triage.  Was diagnosed with A-fib in June states he has been feeling fine.  Denies fever, nausea, vomiting, chest pain, bowel changes, urinary symptoms.  Review of Systems  Positive: As above Negative: As above  Physical Exam  BP (!) 158/89 (BP Location: Right Arm)   Pulse 92   Temp 98.2 F (36.8 C) (Oral)   Resp 20   SpO2 94%  Gen:   Awake, no distress   Resp:  Normal effort, 96% on 2 L nasal cannula MSK:   Moves extremities without difficulty  Other:    Medical Decision Making  Medically screening exam initiated at 12:12 PM.  Appropriate orders placed.  Steven Simmons was informed that the remainder of the evaluation will be completed by another provider, this initial triage assessment does not replace that evaluation, and the importance of remaining in the ED until their evaluation is complete.  Work-up initiated.   Steven Simmons, [place] 01/31/22 1239

## 2022-01-31 NOTE — ED Triage Notes (Signed)
Per EMS- patient is from home. Patient c/o SOB and feeling like his heart was racing. Patient has a history of atrial fib. Patient reports that he is working with DSS because he is unable to cook or take care of himself.

## 2022-01-31 NOTE — Discharge Instructions (Addendum)
Take prednisone as prescribed.  Use albuterol every 4 hours as needed for cough or wheezing  See your doctor for follow-up  Also see your social worker regarding your housing issues  Return to ER if you have worse shortness of breath, cough, trouble breathing

## 2022-01-31 NOTE — ED Provider Notes (Signed)
[facility] DEPT Provider Note   CSN: [id number] Arrival date & time: 01/31/22  1133     History  Chief Complaint  Patient presents with   Shortness of Breath   Tachycardia    Steven Simmons is a 81 y.o. male hx of A-fib on Cardizem, here with shortness of breath. Patient has unstable housing situation and is in the process of getting into an assisted living. Woke up today and had some shortness of breath and palpitations. Patient denies fevers or COVID exposure   The history is provided by the patient.       Home Medications Prior to Admission medications   Medication Sig Start Date End Date Taking? Authorizing Provider  acetaminophen (TYLENOL) 500 MG tablet Take 1,000 mg by mouth every 6 (six) hours as needed for mild pain.    [provider]  diltiazem (CARDIZEM CD) 120 MG 24 hr capsule Take 1 capsule (120 mg total) by mouth daily. 01/18/22 02/17/22  Wells Guiles, DO  pantoprazole (PROTONIX) 40 MG tablet Take 1 tablet (40 mg total) by mouth daily. 01/18/22 02/17/22  Wells Guiles, DO  potassium chloride SA (KLOR-CON M) 20 MEQ tablet Take 1 tablet (20 mEq total) by mouth daily for 7 days. 01/18/22 01/25/22  Wells Guiles, DO      Allergies    Ceclor [cefaclor]; Veralipride; Penicillins; Amlodipine; Antihistamines, chlorpheniramine-type; Beta adrenergic blockers; Celebrex [celecoxib]; Ibuprofen; Lipitor [atorvastatin]; and Nutrasweet aspartame [aspartame]    Review of Systems   Review of Systems  Respiratory:  Positive for shortness of breath.   All other systems reviewed and are negative.   Physical Exam Updated Vital Signs BP (!) 149/100 (BP Location: Left Arm)   Pulse 96   Temp 98.3 F (36.8 C) (Oral)   Resp 16   Ht 6' (1.829 m)   Wt 93.3 kg   SpO2 99%   BMI 27.88 kg/m  Physical Exam Vitals and nursing note reviewed.  Constitutional:      Comments: Disheveled   HENT:     Head: Normocephalic.  Eyes:      Extraocular Movements: Extraocular movements intact.     Pupils: Pupils are equal, round, and reactive to light.  Cardiovascular:     Rate and Rhythm: Normal rate and regular rhythm.  Pulmonary:     Comments: + mild wheezing bilaterally.  Abdominal:     General: Bowel sounds are normal.     Palpations: Abdomen is soft.  Musculoskeletal:        General: Normal range of motion.     Cervical back: Normal range of motion and neck supple.  Skin:    General: Skin is warm.     Capillary Refill: Capillary refill takes less than 2 seconds.  Neurological:     General: No focal deficit present.     Mental Status: He is oriented to person, place, and time.  Psychiatric:        Mood and Affect: Mood normal.        Behavior: Behavior normal.     ED Results / Procedures / Treatments   Labs (all labs ordered are listed, but only abnormal results are displayed) Labs Reviewed  BASIC METABOLIC PANEL - Abnormal; Notable for the following components:      Result Value   Glucose, Bld 133 (*)    Calcium 8.6 (*)    All other components within normal limits  CBC WITH DIFFERENTIAL/PLATELET - Abnormal; Notable for the following components:   Hemoglobin 11.8 (*)  HCT 37.9 (*)    Platelets 407 (*)    Neutro Abs 7.9 (*)    All other components within normal limits  URINALYSIS, ROUTINE W REFLEX MICROSCOPIC    EKG EKG Interpretation  Date/Time:  Sunday January 31 2022 11:47:27 EDT Ventricular Rate:  95 PR Interval:    QRS Duration: 87 QT Interval:  351 QTC Calculation: 442 R Axis:   28 Text Interpretation: Atrial fibrillation Baseline wander in lead(s) V1 No significant change since last tracing Confirmed by Wandra Arthurs [phone]) on 01/31/2022 3:34:55 PM  Radiology DG Chest 2 View  Result Date: 01/31/2022 CLINICAL DATA:  Shortness of breath. EXAM: CHEST - 2 VIEW COMPARISON:  01/11/2022 FINDINGS: Cardiopericardial silhouette is at upper limits of normal for size. Interstitial markings are  diffusely coarsened with chronic features. Bibasilar patchy atelectasis or infiltrate with small bilateral pleural effusions. The visualized bony structures of the thorax are unremarkable. IMPRESSION: Bibasilar atelectasis or infiltrate with small bilateral pleural effusions. Electronically Signed   By: Misty Stanley M.D.   On: 01/31/2022 12:53    Procedures Procedures    Medications Ordered in ED Medications  albuterol (PROVENTIL) (2.5 MG/3ML) 0.083% nebulizer solution 5 mg (has no administration in time range)  ipratropium (ATROVENT) nebulizer solution 0.5 mg (has no administration in time range)  predniSONE (DELTASONE) tablet 60 mg (has no administration in time range)  ipratropium-albuterol (DUONEB) 0.5-2.5 (3) MG/3ML nebulizer solution 3 mL (3 mLs Nebulization Given 01/31/22 1253)    ED Course/ Medical Decision Making/ A&P                           Medical Decision Making Steven Simmons is a 81 y.o. male here with shortness of breath. Likely mild COPD exacerbation. Patient not hypoxic.  Patient has mild wheezing.  We will get chest x-ray to rule out pneumonia.  Patient is in A-fib but this is a chronic problem.  Patient does have some housing issues but that has been getting addressed with primary care doctor and social work outpatient.  5:32 PM Labs unremarkable and chest x-ray showed bibasilar atelectasis.  Patient is afebrile and white count is normal.  Patient felt better after albuterol and prednisone.  Will discharge home with a course of prednisone and albuterol.  Told him to follow-up with his primary care doctor and also social worker regarding housing problem.    Problems Addressed: Chronic obstructive pulmonary disease with acute exacerbation ([place]): acute illness or injury  Amount and/or Complexity of Data Reviewed Labs: ordered. Decision-making details documented in ED Course.  Risk Prescription drug management.    Final Clinical Impression(s) / ED Diagnoses Final  diagnoses:  None    Rx / DC Orders ED Discharge Orders     None         Drenda Freeze, MD 01/31/22 1734

## 2022-02-06 ENCOUNTER — Other Ambulatory Visit (HOSPITAL_COMMUNITY): Payer: Self-pay

## 2022-03-03 ENCOUNTER — Other Ambulatory Visit (HOSPITAL_COMMUNITY): Payer: Self-pay

## 2022-03-03 ENCOUNTER — Emergency Department (HOSPITAL_COMMUNITY): Payer: Medicare Other

## 2022-03-03 ENCOUNTER — Other Ambulatory Visit: Payer: Self-pay

## 2022-03-03 ENCOUNTER — Emergency Department (HOSPITAL_COMMUNITY)
Admission: EM | Admit: 2022-03-03 | Discharge: 2022-03-03 | Disposition: A | Discharge status: Home / Self Care | Payer: Medicare Other | Attending: Emergency Medicine | Admitting: Emergency Medicine

## 2022-03-03 ENCOUNTER — Encounter (HOSPITAL_COMMUNITY): Payer: Self-pay | Admitting: Emergency Medicine

## 2022-03-03 DIAGNOSIS — Z79899 Other long term (current) drug therapy: Secondary | ICD-10-CM | POA: Diagnosis not present

## 2022-03-03 DIAGNOSIS — I10 Essential (primary) hypertension: Secondary | ICD-10-CM | POA: Diagnosis not present

## 2022-03-03 DIAGNOSIS — I4891 Unspecified atrial fibrillation: Secondary | ICD-10-CM | POA: Diagnosis not present

## 2022-03-03 DIAGNOSIS — R739 Hyperglycemia, unspecified: Secondary | ICD-10-CM | POA: Diagnosis not present

## 2022-03-03 DIAGNOSIS — R778 Other specified abnormalities of plasma proteins: Secondary | ICD-10-CM | POA: Diagnosis not present

## 2022-03-03 DIAGNOSIS — R0600 Dyspnea, unspecified: Secondary | ICD-10-CM

## 2022-03-03 DIAGNOSIS — R0609 Other forms of dyspnea: Secondary | ICD-10-CM | POA: Diagnosis not present

## 2022-03-03 DIAGNOSIS — J90 Pleural effusion, not elsewhere classified: Secondary | ICD-10-CM | POA: Diagnosis not present

## 2022-03-03 DIAGNOSIS — R0602 Shortness of breath: Secondary | ICD-10-CM | POA: Diagnosis not present

## 2022-03-03 DIAGNOSIS — Z1152 Encounter for screening for COVID-19: Secondary | ICD-10-CM | POA: Insufficient documentation

## 2022-03-03 DIAGNOSIS — Z8673 Personal history of transient ischemic attack (TIA), and cerebral infarction without residual deficits: Secondary | ICD-10-CM | POA: Diagnosis not present

## 2022-03-03 DIAGNOSIS — R0689 Other abnormalities of breathing: Secondary | ICD-10-CM | POA: Diagnosis not present

## 2022-03-03 DIAGNOSIS — I959 Hypotension, unspecified: Secondary | ICD-10-CM | POA: Diagnosis not present

## 2022-03-03 DIAGNOSIS — I509 Heart failure, unspecified: Secondary | ICD-10-CM | POA: Diagnosis not present

## 2022-03-03 DIAGNOSIS — R000 Tachycardia, unspecified: Secondary | ICD-10-CM | POA: Diagnosis not present

## 2022-03-03 LAB — BASIC METABOLIC PANEL
Anion gap: 9 (ref 5–15)
BUN: 29 mg/dL — ABNORMAL HIGH (ref 8–23)
CO2: 24 mmol/L (ref 22–32)
Calcium: 8.8 mg/dL — ABNORMAL LOW (ref 8.9–10.3)
Chloride: 104 mmol/L (ref 98–111)
Creatinine, Ser: 1.02 mg/dL (ref 0.61–1.24)
GFR, Estimated: >60 mL/min (ref >60)
Glucose, Bld: 175 mg/dL — ABNORMAL HIGH (ref 70–99)
Potassium: 3.5 mmol/L (ref 3.5–5.1)
Sodium: 137 mmol/L (ref 135–145)

## 2022-03-03 LAB — D-DIMER, QUANTITATIVE: D-Dimer, Quant: 1.47 ug/mL-FEU — ABNORMAL HIGH (ref 0.00–0.50)

## 2022-03-03 LAB — RESP PANEL BY RT-PCR (FLU A&B, COVID) ARPGX2
Influenza A by PCR: NEGATIVE
Influenza B by PCR: NEGATIVE
SARS Coronavirus 2 by RT PCR: NEGATIVE

## 2022-03-03 LAB — BRAIN NATRIURETIC PEPTIDE: B Natriuretic Peptide: 522.6 pg/mL — ABNORMAL HIGH (ref 0.0–100.0)

## 2022-03-03 LAB — TROPONIN I (HIGH SENSITIVITY)
Troponin I (High Sensitivity): 36 ng/L — ABNORMAL HIGH (ref <18)
Troponin I (High Sensitivity): 37 ng/L — ABNORMAL HIGH (ref <18)

## 2022-03-03 MED ORDER — DILTIAZEM HCL ER COATED BEADS 120 MG PO CP24
120.0000 mg | ORAL_CAPSULE | Freq: Every day | ORAL | 0 refills | Status: AC
  Filled 2022-03-03: qty 30, 30d supply, fill #0

## 2022-03-03 MED ORDER — DILTIAZEM HCL 25 MG/5ML IV SOLN: 20.0000 mg | Freq: Once | INTRAVENOUS | Status: DC

## 2022-03-03 MED ORDER — DILTIAZEM HCL 25 MG/5ML IV SOLN
10.0000 mg | Freq: Once | INTRAVENOUS | Status: AC
  Administered 2022-03-03: 10 mg via INTRAVENOUS
  Filled 2022-03-03: qty 5

## 2022-03-03 MED ORDER — DILTIAZEM HCL ER COATED BEADS 120 MG PO CP24
120.0000 mg | ORAL_CAPSULE | Freq: Every day | ORAL | Status: DC
  Administered 2022-03-03: 120 mg via ORAL
  Filled 2022-03-03: qty 1

## 2022-03-03 MED ORDER — IOHEXOL 350 MG/ML SOLN
80.0000 mL | Freq: Once | INTRAVENOUS | Status: AC | PRN
  Administered 2022-03-03: 80 mL via INTRAVENOUS

## 2022-03-03 NOTE — ED Provider Notes (Signed)
  [facility] DEPT Provider Note   CSN: [id number] Arrival date & time: 03/03/22  1003     History {Add pertinent medical, surgical, social history, OB history to HPI:1} No chief complaint on file.   Steven Simmons is a 81 y.o. male.  HPI     Home Medications Prior to Admission medications   Medication Sig Start Date End Date Taking? Authorizing Provider  acetaminophen (TYLENOL) 500 MG tablet Take 1,000 mg by mouth every 6 (six) hours as needed for mild pain.    [provider]  diltiazem (CARDIZEM CD) 120 MG 24 hr capsule Take 1 capsule (120 mg total) by mouth daily. 01/18/22 02/17/22  Wells Guiles, DO  pantoprazole (PROTONIX) 40 MG tablet Take 1 tablet (40 mg total) by mouth daily. 01/18/22 02/17/22  Wells Guiles, DO  potassium chloride SA (KLOR-CON M) 20 MEQ tablet Take 1 tablet (20 mEq total) by mouth daily for 7 days. 01/18/22 01/25/22  Wells Guiles, DO  predniSONE (DELTASONE) 20 MG tablet Take 60 mg daily x 2 days then 40 mg daily x 2 days then 20 mg daily x 2 days 01/31/22   Drenda Freeze, MD      Allergies    Ceclor [cefaclor]; Veralipride; Penicillins; Amlodipine; Antihistamines, chlorpheniramine-type; Beta adrenergic blockers; Celebrex [celecoxib]; Ibuprofen; Lipitor [atorvastatin]; and Nutrasweet aspartame [aspartame]    Review of Systems   Review of Systems  Physical Exam Updated Vital Signs There were no vitals taken for this visit. Physical Exam  ED Results / Procedures / Treatments   Labs (all labs ordered are listed, but only abnormal results are displayed) Labs Reviewed - No data to display  EKG None  Radiology No results found.  Procedures Procedures  {Document cardiac monitor, telemetry assessment procedure when appropriate:1}  Medications Ordered in ED Medications - No data to display  ED Course/ Medical Decision Making/ A&P Clinical Course as of 03/03/22 1411  Wed Mar 03, 2022  1135  D-Dimer, Quant(!): 1.47 [JL]  1135 B Natriuretic Peptide(!): 522.6 [JL]    Clinical Course User Index [JL] Regan Lemming, MD                           Medical Decision Making Amount and/or Complexity of Data Reviewed Labs: ordered. Decision-making details documented in ED Course. Radiology: ordered.  Risk Prescription drug management.   ***  {Document critical care time when appropriate:1} {Document review of labs and clinical decision tools ie heart score, Chads2Vasc2 etc:1}  {Document your independent review of radiology images, and any outside records:1} {Document your discussion with family members, caretakers, and with consultants:1} {Document social determinants of health affecting pt's care:1} {Document your decision making why or why not admission, treatments were needed:1} Final Clinical Impression(s) / ED Diagnoses Final diagnoses:  None    Rx / DC Orders ED Discharge Orders     None

## 2022-03-03 NOTE — Progress Notes (Addendum)
Chart reveiwed. This CSW noted that the pt has Medicare A/B without [facility] waiver and requires a 3 night inpatient hospitalization within the past 30 days to be eligible for SNF. Pt does not have an IP admission within the past 30 days. EDP notified. EDP informed there may be indication for admission but uncertain at this time. TOC following.  Addend @ 11:29 AM Per Dr. Armandina Gemma, pt will not be admitted. Pt is ineligible for MATCH due to having insurance.

## 2022-03-03 NOTE — ED Triage Notes (Signed)
Per [facility] pt coming from home- c/o  intermittent shortness of breath since July.  Over the past few days having exertional shortness of breath. Worse since the cold weather. Patient does not have any heat in his house. EKG with ems showing a-fib with RVR 110-140. Placed on 2L [place] for comfort. Patient arrives in very soiled clothes and poor personal hygiene.

## 2022-03-03 NOTE — Discharge Instructions (Addendum)
Your heart rate was intermittently elevated due to atrial fibrillation.  Your shortness of breath is likely due to this.  Your atrial fibrillation was likely not rate controlled because you have not been taking her home Cardizem because she ran out of the medications.  A refill for your home Cardizem has been prescribed to the [facility] outpatient pharmacy.  Please pick this up at discharge.
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
## 2022-03-03 NOTE — ED Notes (Signed)
Medication delivered by pharmacy

## 2022-03-03 NOTE — ED Notes (Signed)
X-ray at bedside

## 2022-03-03 NOTE — ED Notes (Signed)
Pharmacy to deliver Rx to pt, then cab will be called- approved by SW Astrid Divine

## 2022-03-11 ENCOUNTER — Emergency Department (HOSPITAL_COMMUNITY): Payer: Medicare Other

## 2022-03-11 ENCOUNTER — Inpatient Hospital Stay (HOSPITAL_COMMUNITY)
Admission: EM | Admit: 2022-03-11 | Discharge: 2022-03-15 | DRG: 191 | Disposition: A | Discharge status: Skilled Nursing Facility | Payer: Medicare Other | Attending: Internal Medicine | Admitting: Internal Medicine

## 2022-03-11 ENCOUNTER — Other Ambulatory Visit: Payer: Self-pay

## 2022-03-11 DIAGNOSIS — Z79899 Other long term (current) drug therapy: Secondary | ICD-10-CM

## 2022-03-11 DIAGNOSIS — R0609 Other forms of dyspnea: Secondary | ICD-10-CM | POA: Diagnosis present

## 2022-03-11 DIAGNOSIS — I5031 Acute diastolic (congestive) heart failure: Secondary | ICD-10-CM | POA: Diagnosis not present

## 2022-03-11 DIAGNOSIS — I110 Hypertensive heart disease with heart failure: Secondary | ICD-10-CM | POA: Diagnosis present

## 2022-03-11 DIAGNOSIS — Z800 Family history of malignant neoplasm of digestive organs: Secondary | ICD-10-CM

## 2022-03-11 DIAGNOSIS — Z8673 Personal history of transient ischemic attack (TIA), and cerebral infarction without residual deficits: Secondary | ICD-10-CM

## 2022-03-11 DIAGNOSIS — M6259 Muscle wasting and atrophy, not elsewhere classified, multiple sites: Secondary | ICD-10-CM | POA: Diagnosis not present

## 2022-03-11 DIAGNOSIS — Z9842 Cataract extraction status, left eye: Secondary | ICD-10-CM | POA: Diagnosis not present

## 2022-03-11 DIAGNOSIS — J441 Chronic obstructive pulmonary disease with (acute) exacerbation: Secondary | ICD-10-CM | POA: Diagnosis present

## 2022-03-11 DIAGNOSIS — J9811 Atelectasis: Secondary | ICD-10-CM | POA: Diagnosis present

## 2022-03-11 DIAGNOSIS — Z6833 Body mass index (BMI) 33.0-33.9, adult: Secondary | ICD-10-CM

## 2022-03-11 DIAGNOSIS — I10 Essential (primary) hypertension: Secondary | ICD-10-CM | POA: Diagnosis not present

## 2022-03-11 DIAGNOSIS — Z888 Allergy status to other drugs, medicaments and biological substances status: Secondary | ICD-10-CM

## 2022-03-11 DIAGNOSIS — J90 Pleural effusion, not elsewhere classified: Secondary | ICD-10-CM | POA: Diagnosis not present

## 2022-03-11 DIAGNOSIS — Z66 Do not resuscitate: Secondary | ICD-10-CM | POA: Diagnosis present

## 2022-03-11 DIAGNOSIS — M6281 Muscle weakness (generalized): Secondary | ICD-10-CM | POA: Diagnosis not present

## 2022-03-11 DIAGNOSIS — R2689 Other abnormalities of gait and mobility: Secondary | ICD-10-CM | POA: Diagnosis not present

## 2022-03-11 DIAGNOSIS — I071 Rheumatic tricuspid insufficiency: Secondary | ICD-10-CM | POA: Diagnosis present

## 2022-03-11 DIAGNOSIS — M5430 Sciatica, unspecified side: Secondary | ICD-10-CM | POA: Diagnosis present

## 2022-03-11 DIAGNOSIS — R062 Wheezing: Secondary | ICD-10-CM | POA: Diagnosis not present

## 2022-03-11 DIAGNOSIS — Z8711 Personal history of peptic ulcer disease: Secondary | ICD-10-CM

## 2022-03-11 DIAGNOSIS — Z9841 Cataract extraction status, right eye: Secondary | ICD-10-CM

## 2022-03-11 DIAGNOSIS — E669 Obesity, unspecified: Secondary | ICD-10-CM | POA: Diagnosis present

## 2022-03-11 DIAGNOSIS — Z87891 Personal history of nicotine dependence: Secondary | ICD-10-CM | POA: Diagnosis not present

## 2022-03-11 DIAGNOSIS — R531 Weakness: Secondary | ICD-10-CM

## 2022-03-11 DIAGNOSIS — R7989 Other specified abnormal findings of blood chemistry: Secondary | ICD-10-CM | POA: Insufficient documentation

## 2022-03-11 DIAGNOSIS — I4891 Unspecified atrial fibrillation: Secondary | ICD-10-CM | POA: Diagnosis not present

## 2022-03-11 DIAGNOSIS — Z743 Need for continuous supervision: Secondary | ICD-10-CM | POA: Diagnosis not present

## 2022-03-11 DIAGNOSIS — Z880 Allergy status to penicillin: Secondary | ICD-10-CM | POA: Diagnosis not present

## 2022-03-11 DIAGNOSIS — R0902 Hypoxemia: Secondary | ICD-10-CM | POA: Diagnosis not present

## 2022-03-11 DIAGNOSIS — Z8249 Family history of ischemic heart disease and other diseases of the circulatory system: Secondary | ICD-10-CM

## 2022-03-11 DIAGNOSIS — R0602 Shortness of breath: Secondary | ICD-10-CM | POA: Diagnosis not present

## 2022-03-11 DIAGNOSIS — Z961 Presence of intraocular lens: Secondary | ICD-10-CM | POA: Diagnosis present

## 2022-03-11 DIAGNOSIS — J189 Pneumonia, unspecified organism: Secondary | ICD-10-CM | POA: Diagnosis not present

## 2022-03-11 DIAGNOSIS — R54 Age-related physical debility: Secondary | ICD-10-CM | POA: Diagnosis present

## 2022-03-11 DIAGNOSIS — K219 Gastro-esophageal reflux disease without esophagitis: Secondary | ICD-10-CM | POA: Diagnosis present

## 2022-03-11 DIAGNOSIS — Z1152 Encounter for screening for COVID-19: Secondary | ICD-10-CM | POA: Diagnosis not present

## 2022-03-11 DIAGNOSIS — I480 Paroxysmal atrial fibrillation: Secondary | ICD-10-CM | POA: Diagnosis present

## 2022-03-11 DIAGNOSIS — R0689 Other abnormalities of breathing: Secondary | ICD-10-CM | POA: Diagnosis not present

## 2022-03-11 DIAGNOSIS — R278 Other lack of coordination: Secondary | ICD-10-CM | POA: Diagnosis not present

## 2022-03-11 DIAGNOSIS — Z9049 Acquired absence of other specified parts of digestive tract: Secondary | ICD-10-CM

## 2022-03-11 DIAGNOSIS — R41841 Cognitive communication deficit: Secondary | ICD-10-CM | POA: Diagnosis not present

## 2022-03-11 LAB — URINALYSIS, ROUTINE W REFLEX MICROSCOPIC
Bilirubin Urine: NEGATIVE
Glucose, UA: NEGATIVE mg/dL
Hgb urine dipstick: NEGATIVE
Ketones, ur: 5 mg/dL — AB
Nitrite: NEGATIVE
Protein, ur: 30 mg/dL — AB
Specific Gravity, Urine: 1.027 (ref 1.005–1.030)
WBC, UA: >50 WBC/hpf — ABNORMAL HIGH (ref 0–5)
pH: 5 (ref 5.0–8.0)

## 2022-03-11 LAB — CBC WITH DIFFERENTIAL/PLATELET
Abs Immature Granulocytes: 0.05 10*3/uL (ref 0.00–0.07)
Basophils Absolute: 0 10*3/uL (ref 0.0–0.1)
Basophils Relative: 1 %
Eosinophils Absolute: 0.1 10*3/uL (ref 0.0–0.5)
Eosinophils Relative: 1 %
HCT: 37 % — ABNORMAL LOW (ref 39.0–52.0)
Hemoglobin: 11.4 g/dL — ABNORMAL LOW (ref 13.0–17.0)
Immature Granulocytes: 1 %
Lymphocytes Relative: 9 %
Lymphs Abs: 0.7 10*3/uL (ref 0.7–4.0)
MCH: 27.2 pg (ref 26.0–34.0)
MCHC: 30.8 g/dL (ref 30.0–36.0)
MCV: 88.3 fL (ref 80.0–100.0)
Monocytes Absolute: 0.7 10*3/uL (ref 0.1–1.0)
Monocytes Relative: 9 %
Neutro Abs: 6.2 10*3/uL (ref 1.7–7.7)
Neutrophils Relative %: 79 %
Platelets: 396 10*3/uL (ref 150–400)
RBC: 4.19 MIL/uL — ABNORMAL LOW (ref 4.22–5.81)
RDW: 16.3 % — ABNORMAL HIGH (ref 11.5–15.5)
WBC: 7.8 10*3/uL (ref 4.0–10.5)
nRBC: 0 % (ref 0.0–0.2)

## 2022-03-11 LAB — COMPREHENSIVE METABOLIC PANEL
ALT: 16 U/L (ref 0–44)
AST: 20 U/L (ref 15–41)
Albumin: 3.1 g/dL — ABNORMAL LOW (ref 3.5–5.0)
Alkaline Phosphatase: 83 U/L (ref 38–126)
Anion gap: 9 (ref 5–15)
BUN: 33 mg/dL — ABNORMAL HIGH (ref 8–23)
CO2: 21 mmol/L — ABNORMAL LOW (ref 22–32)
Calcium: 8.8 mg/dL — ABNORMAL LOW (ref 8.9–10.3)
Chloride: 108 mmol/L (ref 98–111)
Creatinine, Ser: 1.01 mg/dL (ref 0.61–1.24)
GFR, Estimated: >60 mL/min (ref >60)
Glucose, Bld: 140 mg/dL — ABNORMAL HIGH (ref 70–99)
Potassium: 3.7 mmol/L (ref 3.5–5.1)
Sodium: 138 mmol/L (ref 135–145)
Total Bilirubin: 0.6 mg/dL (ref 0.3–1.2)
Total Protein: 6.7 g/dL (ref 6.5–8.1)

## 2022-03-11 LAB — RESP PANEL BY RT-PCR (FLU A&B, COVID) ARPGX2
Influenza A by PCR: NEGATIVE
Influenza B by PCR: NEGATIVE
SARS Coronavirus 2 by RT PCR: NEGATIVE

## 2022-03-11 LAB — LACTIC ACID, PLASMA
Lactic Acid, Venous: 1.4 mmol/L (ref 0.5–1.9)
Lactic Acid, Venous: 1.1 mmol/L (ref 0.5–1.9)

## 2022-03-11 LAB — BRAIN NATRIURETIC PEPTIDE: B Natriuretic Peptide: 416 pg/mL — ABNORMAL HIGH (ref 0.0–100.0)

## 2022-03-11 LAB — PROTIME-INR
INR: 1.1 (ref 0.8–1.2)
Prothrombin Time: 14.4 seconds (ref 11.4–15.2)

## 2022-03-11 LAB — SARS CORONAVIRUS 2 BY RT PCR: SARS Coronavirus 2 by RT PCR: NEGATIVE

## 2022-03-11 LAB — TROPONIN I (HIGH SENSITIVITY)
Troponin I (High Sensitivity): 23 ng/L — ABNORMAL HIGH (ref <18)
Troponin I (High Sensitivity): 25 ng/L — ABNORMAL HIGH (ref <18)

## 2022-03-11 MED ORDER — ALBUTEROL SULFATE HFA 108 (90 BASE) MCG/ACT IN AERS: 2.0000 | INHALATION_SPRAY | RESPIRATORY_TRACT | Status: DC | PRN

## 2022-03-11 MED ORDER — ACETAMINOPHEN 325 MG PO TABS
650.0000 mg | ORAL_TABLET | Freq: Four times a day (QID) | ORAL | Status: DC | PRN
  Administered 2022-03-11 – 2022-03-15 (×3): 650 mg via ORAL
  Filled 2022-03-11 (×3): qty 2

## 2022-03-11 MED ORDER — LEVOFLOXACIN 750 MG PO TABS
750.0000 mg | ORAL_TABLET | Freq: Every day | ORAL | Status: DC
  Administered 2022-03-12 – 2022-03-15 (×4): 750 mg via ORAL
  Filled 2022-03-11 (×4): qty 1

## 2022-03-11 MED ORDER — ACETAMINOPHEN 650 MG RE SUPP: 650.0000 mg | Freq: Four times a day (QID) | RECTAL | Status: DC | PRN

## 2022-03-11 MED ORDER — LEVOFLOXACIN IN D5W 750 MG/150ML IV SOLN: 750.0000 mg | INTRAVENOUS | Status: DC

## 2022-03-11 MED ORDER — PANTOPRAZOLE SODIUM 40 MG PO TBEC
40.0000 mg | DELAYED_RELEASE_TABLET | Freq: Every day | ORAL | Status: DC
  Administered 2022-03-11 – 2022-03-15 (×5): 40 mg via ORAL
  Filled 2022-03-11 (×5): qty 1

## 2022-03-11 MED ORDER — GUAIFENESIN ER 600 MG PO TB12
1200.0000 mg | ORAL_TABLET | Freq: Two times a day (BID) | ORAL | Status: DC
  Administered 2022-03-11 – 2022-03-15 (×9): 1200 mg via ORAL
  Filled 2022-03-11 (×9): qty 2

## 2022-03-11 MED ORDER — ENOXAPARIN SODIUM 40 MG/0.4ML IJ SOSY
40.0000 mg | PREFILLED_SYRINGE | INTRAMUSCULAR | Status: DC
  Administered 2022-03-11 – 2022-03-14 (×4): 40 mg via SUBCUTANEOUS
  Filled 2022-03-11 (×4): qty 0.4

## 2022-03-11 MED ORDER — LEVOFLOXACIN IN D5W 750 MG/150ML IV SOLN
750.0000 mg | Freq: Once | INTRAVENOUS | Status: AC
  Administered 2022-03-11: 750 mg via INTRAVENOUS
  Filled 2022-03-11: qty 150

## 2022-03-11 MED ORDER — IPRATROPIUM-ALBUTEROL 0.5-2.5 (3) MG/3ML IN SOLN
3.0000 mL | Freq: Four times a day (QID) | RESPIRATORY_TRACT | Status: DC | PRN
  Administered 2022-03-15: 3 mL via RESPIRATORY_TRACT
  Filled 2022-03-11: qty 3

## 2022-03-11 MED ORDER — DILTIAZEM HCL ER COATED BEADS 120 MG PO CP24
120.0000 mg | ORAL_CAPSULE | Freq: Every day | ORAL | Status: DC
  Administered 2022-03-12 – 2022-03-15 (×4): 120 mg via ORAL
  Filled 2022-03-11 (×4): qty 1
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
## 2022-03-12 ENCOUNTER — Observation Stay (HOSPITAL_COMMUNITY): Payer: Medicare Other

## 2022-03-12 ENCOUNTER — Encounter (HOSPITAL_COMMUNITY): Payer: Self-pay | Admitting: Internal Medicine

## 2022-03-12 DIAGNOSIS — Z888 Allergy status to other drugs, medicaments and biological substances status: Secondary | ICD-10-CM | POA: Diagnosis not present

## 2022-03-12 DIAGNOSIS — E669 Obesity, unspecified: Secondary | ICD-10-CM | POA: Diagnosis present

## 2022-03-12 DIAGNOSIS — I071 Rheumatic tricuspid insufficiency: Secondary | ICD-10-CM | POA: Diagnosis present

## 2022-03-12 DIAGNOSIS — Z6833 Body mass index (BMI) 33.0-33.9, adult: Secondary | ICD-10-CM | POA: Diagnosis not present

## 2022-03-12 DIAGNOSIS — J441 Chronic obstructive pulmonary disease with (acute) exacerbation: Secondary | ICD-10-CM | POA: Diagnosis present

## 2022-03-12 DIAGNOSIS — R41841 Cognitive communication deficit: Secondary | ICD-10-CM | POA: Diagnosis not present

## 2022-03-12 DIAGNOSIS — Z880 Allergy status to penicillin: Secondary | ICD-10-CM | POA: Diagnosis not present

## 2022-03-12 DIAGNOSIS — Z800 Family history of malignant neoplasm of digestive organs: Secondary | ICD-10-CM | POA: Diagnosis not present

## 2022-03-12 DIAGNOSIS — K219 Gastro-esophageal reflux disease without esophagitis: Secondary | ICD-10-CM | POA: Diagnosis present

## 2022-03-12 DIAGNOSIS — Z8711 Personal history of peptic ulcer disease: Secondary | ICD-10-CM | POA: Diagnosis not present

## 2022-03-12 DIAGNOSIS — Z66 Do not resuscitate: Secondary | ICD-10-CM | POA: Diagnosis present

## 2022-03-12 DIAGNOSIS — Z9049 Acquired absence of other specified parts of digestive tract: Secondary | ICD-10-CM | POA: Diagnosis not present

## 2022-03-12 DIAGNOSIS — I110 Hypertensive heart disease with heart failure: Secondary | ICD-10-CM | POA: Diagnosis present

## 2022-03-12 DIAGNOSIS — R2689 Other abnormalities of gait and mobility: Secondary | ICD-10-CM | POA: Diagnosis not present

## 2022-03-12 DIAGNOSIS — R0902 Hypoxemia: Secondary | ICD-10-CM | POA: Diagnosis not present

## 2022-03-12 DIAGNOSIS — Z9841 Cataract extraction status, right eye: Secondary | ICD-10-CM | POA: Diagnosis not present

## 2022-03-12 DIAGNOSIS — R0609 Other forms of dyspnea: Secondary | ICD-10-CM | POA: Diagnosis not present

## 2022-03-12 DIAGNOSIS — I5031 Acute diastolic (congestive) heart failure: Secondary | ICD-10-CM | POA: Diagnosis not present

## 2022-03-12 DIAGNOSIS — R54 Age-related physical debility: Secondary | ICD-10-CM | POA: Diagnosis present

## 2022-03-12 DIAGNOSIS — Z1152 Encounter for screening for COVID-19: Secondary | ICD-10-CM | POA: Diagnosis not present

## 2022-03-12 DIAGNOSIS — M6281 Muscle weakness (generalized): Secondary | ICD-10-CM | POA: Diagnosis not present

## 2022-03-12 DIAGNOSIS — Z8249 Family history of ischemic heart disease and other diseases of the circulatory system: Secondary | ICD-10-CM | POA: Diagnosis not present

## 2022-03-12 DIAGNOSIS — Z79899 Other long term (current) drug therapy: Secondary | ICD-10-CM | POA: Diagnosis not present

## 2022-03-12 DIAGNOSIS — M5430 Sciatica, unspecified side: Secondary | ICD-10-CM | POA: Diagnosis present

## 2022-03-12 DIAGNOSIS — Z9842 Cataract extraction status, left eye: Secondary | ICD-10-CM | POA: Diagnosis not present

## 2022-03-12 DIAGNOSIS — Z87891 Personal history of nicotine dependence: Secondary | ICD-10-CM | POA: Diagnosis not present

## 2022-03-12 DIAGNOSIS — Z8673 Personal history of transient ischemic attack (TIA), and cerebral infarction without residual deficits: Secondary | ICD-10-CM | POA: Diagnosis not present

## 2022-03-12 DIAGNOSIS — J9811 Atelectasis: Secondary | ICD-10-CM | POA: Diagnosis present

## 2022-03-12 DIAGNOSIS — M6259 Muscle wasting and atrophy, not elsewhere classified, multiple sites: Secondary | ICD-10-CM | POA: Diagnosis not present

## 2022-03-12 DIAGNOSIS — I480 Paroxysmal atrial fibrillation: Secondary | ICD-10-CM | POA: Diagnosis present

## 2022-03-12 DIAGNOSIS — Z961 Presence of intraocular lens: Secondary | ICD-10-CM | POA: Diagnosis present

## 2022-03-12 DIAGNOSIS — Z743 Need for continuous supervision: Secondary | ICD-10-CM | POA: Diagnosis not present

## 2022-03-12 DIAGNOSIS — R278 Other lack of coordination: Secondary | ICD-10-CM | POA: Diagnosis not present

## 2022-03-12 LAB — CBC
HCT: 33.6 % — ABNORMAL LOW (ref 39.0–52.0)
Hemoglobin: 10.3 g/dL — ABNORMAL LOW (ref 13.0–17.0)
MCH: 27 pg (ref 26.0–34.0)
MCHC: 30.7 g/dL (ref 30.0–36.0)
MCV: 88.2 fL (ref 80.0–100.0)
Platelets: 338 10*3/uL (ref 150–400)
RBC: 3.81 MIL/uL — ABNORMAL LOW (ref 4.22–5.81)
RDW: 16.6 % — ABNORMAL HIGH (ref 11.5–15.5)
WBC: 6.1 10*3/uL (ref 4.0–10.5)
nRBC: 0 % (ref 0.0–0.2)

## 2022-03-12 LAB — ECHOCARDIOGRAM COMPLETE
AR max vel: 2.61 cm2
AV Area VTI: 2.45 cm2
AV Area mean vel: 2.64 cm2
AV Mean grad: 7.3 mmHg
AV Peak grad: 12.5 mmHg
Ao pk vel: 1.77 m/s
Calc EF: 48.9 %
Height: 70 in
S' Lateral: 3.5 cm
Single Plane A2C EF: 50.3 %
Single Plane A4C EF: 50.4 %
Weight: 3668.45 oz

## 2022-03-12 LAB — COMPREHENSIVE METABOLIC PANEL
ALT: 14 U/L (ref 0–44)
AST: 21 U/L (ref 15–41)
Albumin: 2.6 g/dL — ABNORMAL LOW (ref 3.5–5.0)
Alkaline Phosphatase: 78 U/L (ref 38–126)
Anion gap: 7 (ref 5–15)
BUN: 28 mg/dL — ABNORMAL HIGH (ref 8–23)
CO2: 24 mmol/L (ref 22–32)
Calcium: 8.4 mg/dL — ABNORMAL LOW (ref 8.9–10.3)
Chloride: 104 mmol/L (ref 98–111)
Creatinine, Ser: 1 mg/dL (ref 0.61–1.24)
GFR, Estimated: >60 mL/min (ref >60)
Glucose, Bld: 140 mg/dL — ABNORMAL HIGH (ref 70–99)
Potassium: 3.6 mmol/L (ref 3.5–5.1)
Sodium: 135 mmol/L (ref 135–145)
Total Bilirubin: 0.6 mg/dL (ref 0.3–1.2)
Total Protein: 6.2 g/dL — ABNORMAL LOW (ref 6.5–8.1)

## 2022-03-12 LAB — STREP PNEUMONIAE URINARY ANTIGEN: Strep Pneumo Urinary Antigen: NEGATIVE

## 2022-03-12 MED ORDER — PREDNISONE 20 MG PO TABS
40.0000 mg | ORAL_TABLET | Freq: Every day | ORAL | Status: DC
  Administered 2022-03-12 – 2022-03-15 (×4): 40 mg via ORAL
  Filled 2022-03-12 (×4): qty 2

## 2022-03-12 MED ORDER — ALBUTEROL SULFATE (2.5 MG/3ML) 0.083% IN NEBU
2.5000 mg | INHALATION_SOLUTION | Freq: Four times a day (QID) | RESPIRATORY_TRACT | Status: DC
  Administered 2022-03-12 – 2022-03-13 (×4): 2.5 mg via RESPIRATORY_TRACT
  Filled 2022-03-12 (×5): qty 3

## 2022-03-12 NOTE — Evaluation (Signed)
Physical Therapy Evaluation Patient Details Name: Steven Simmons MRN: [id number] DOB: 06-24-1940 Today's Date: 03/12/2022  History of Present Illness  Pt is a 81 yo male admitted with DOE. PMH: arthritis, aspiration pneumonia, GERD, heart murmur, TIA, HTN, migraine, UTI  Clinical Impression  Pt admitted with above diagnosis. Pt from home alone, reports very limited in home ambulation without AD. Pt easily distracted and goes on tangents, difficult to follow back history. Pt needing min A to steady with power to stand and limited ambulation ~34 ft with RW on 2L O2. Unable to check O2 while ambulating due to assist level and pulling o2 tank, dyspnea 3/4, SpO2 97% and HR 121 once seated. Pt without assist at home, recommending ST SNF prior to return home alone. Pt currently with functional limitations due to the deficits listed below (see PT Problem List). Pt will benefit from skilled PT to increase their independence and safety with mobility to allow discharge to the venue listed below.          Recommendations for follow up therapy are one component of a multi-disciplinary discharge planning process, led by the attending physician.  Recommendations may be updated based on patient status, additional functional criteria and insurance authorization.  Follow Up Recommendations Skilled nursing-short term rehab (<3 hours/day) Can patient physically be transported by private vehicle: Yes    Assistance Recommended at Discharge Frequent or constant Supervision/Assistance  Patient can return home with the following  A little help with walking and/or transfers;A little help with bathing/dressing/bathroom;Assistance with cooking/housework;Assist for transportation;Help with stairs or ramp for entrance    Equipment Recommendations None recommended by PT (defer to next venue)  Recommendations for Other Services       Functional Status Assessment Patient has had a recent decline in their functional status  and demonstrates the ability to make significant improvements in function in a reasonable and predictable amount of time.     Precautions / Restrictions Precautions Precautions: Fall Restrictions Weight Bearing Restrictions: No      Mobility  Bed Mobility  General bed mobility comments: seated EOB    Transfers Overall transfer level: Needs assistance Equipment used: Rolling walker (2 wheels) Transfers: Sit to/from Stand Sit to Stand: Min assist  General transfer comment: min A to steady with powering to stand, cues for hand placement and upright posture    Ambulation/Gait Ambulation/Gait assistance: Min assist Gait Distance (Feet): 34 Feet Assistive device: Rolling walker (2 wheels) Gait Pattern/deviations: Step-through pattern, Decreased stride length, Trunk flexed Gait velocity: decreased  General Gait Details: maintains slight bil knee flexion and trunk flexed, step through pattern with decreased cadence, no overt LOB but generally unsteady  Stairs            Wheelchair Mobility    Modified Rankin (Stroke Patients Only)       Balance Overall balance assessment: Needs assistance Sitting-balance support: Feet supported Sitting balance-Leahy Scale: Good  Standing balance support: During functional activity, Bilateral upper extremity supported, Reliant on assistive device for balance Standing balance-Leahy Scale: Poor       Pertinent Vitals/Pain Pain Assessment Pain Assessment: No/denies pain    Home Living Family/patient expects to be discharged to:: Skilled nursing facility Living Arrangements: Alone    Prior Function Prior Level of Function : Independent/Modified Independent  Mobility Comments: pt reports limited ambulation in the home without AD, denies falls ADLs Comments: pt reports ind with bathing, dressing, toileting, orders meals via [employer]     Hand Dominance  Extremity/Trunk Assessment   Upper Extremity Assessment Upper  Extremity Assessment: Overall WFL for tasks assessed    Lower Extremity Assessment Lower Extremity Assessment: Generalized weakness (AROM WFL, strength grossly 2+/5, denies numbness/tingling)    Cervical / Trunk Assessment Cervical / Trunk Assessment: Kyphotic  Communication   Communication: No difficulties  Cognition Arousal/Alertness: Awake/alert Behavior During Therapy: WFL for tasks assessed/performed Overall Cognitive Status: Within Functional Limits for tasks assessed  General Comments: Pt easily distracted, goes off on tangents but easy to redirect. Pt able a&o x4.        General Comments General comments (skin integrity, edema, etc.): dyspnea 3/4 pt on 2L with SpO2 97% once seated and HR 121 max noted- RN notified    Exercises     Assessment/Plan    PT Assessment Patient needs continued PT services  PT Problem List Decreased strength;Decreased activity tolerance;Decreased balance;Decreased knowledge of use of DME;Decreased safety awareness;Cardiopulmonary status limiting activity       PT Treatment Interventions DME instruction;Gait training;Functional mobility training;Therapeutic activities;Therapeutic exercise;Balance training;Patient/family education    PT Goals (Current goals can be found in the Care Plan section)  Acute Rehab PT Goals Patient Stated Goal: breath better PT Goal Formulation: With patient Time For Goal Achievement: 03/26/22 Potential to Achieve Goals: Good    Frequency Min 2X/week     Co-evaluation               AM-PAC PT "6 Clicks" Mobility  Outcome Measure Help needed turning from your back to your side while in a flat bed without using bedrails?: A Little Help needed moving from lying on your back to sitting on the side of a flat bed without using bedrails?: A Little Help needed moving to and from a bed to a chair (including a wheelchair)?: A Little Help needed standing up from a chair using your arms (e.g., wheelchair or bedside  chair)?: A Little Help needed to walk in hospital room?: A Little Help needed climbing 3-5 steps with a railing? : A Lot 6 Click Score: 17    End of Session Equipment Utilized During Treatment: Gait belt Activity Tolerance: Patient tolerated treatment well Patient left: in chair;with call bell/phone within reach;with chair alarm set Nurse Communication: Mobility status;Other (comment) (SpO2) PT Visit Diagnosis: Unsteadiness on feet (R26.81);Other abnormalities of gait and mobility (R26.89);Muscle weakness (generalized) (M62.81)    Time: 0923-3007 PT Time Calculation (min) (ACUTE ONLY): 32 min   Charges:   PT Evaluation $PT Eval Low Complexity: 1 Low PT Treatments $Gait Training: 8-22 mins         Tori Rosina Cressler PT, DPT 03/12/22, 1:49 PM

## 2022-03-12 NOTE — Progress Notes (Signed)
Echocardiogram 2D Echocardiogram has been performed.  Steven Simmons 03/12/2022, 2:35 PM

## 2022-03-12 NOTE — Progress Notes (Signed)
PROGRESS NOTE    Steven Simmons  QTM:[id number] DOB: Oct 05, 1940 DOA: 03/11/2022 PCP: [name], MD    Brief Narrative:  81 year old with history of paroxysmal atrial fibrillation, COPD, hypertension and GERD presented to the emergency room with shortness of breath.  Apparently, he was having difficulty managing his symptoms at home.  He was not able to get around due to his back.  He was not able to reach further medications.  He called EMS for help and they reportedly found his resident was not inhabitable with dirt and feces.  Brought to the emergency room where he was profoundly wheezing and unable to walk.  Admitted with COPD exacerbation.   Assessment & Plan:   Acute COPD exacerbation: Bilateral lower lobe pneumonia versus atelectasis.  More likely atelectasis than pneumonia. Agree with admission to monitored unit because of severity of symptoms. Aggressive bronchodilator therapy, oral steroids, scheduled and as needed bronchodilators, deep breathing exercises, incentive spirometry, chest physiotherapy.  Physical therapy and Occupational Therapy consultation. Antibiotics due to severity of symptoms.  Levaquin for 5 days. Supplemental oxygen to keep saturations more than 92%.  Paroxysmal atrial fibrillation: Currently in sinus rhythm.  Not on anticoagulation.  With his poor compliance and monitoring, will keep off anticoagulation.  Resume Cardizem.  Beta-blockers after wheezing has improved. Echocardiogram pending.  Chronic medical issues including Hypertension GERD on PPI Stable.  Social: Poor living condition.  APS involved.  Currently needs to work with PT OT and rehab placement.     DVT prophylaxis: enoxaparin (LOVENOX) injection 40 mg Start: 03/11/22 2200   Code Status: DNR Family Communication: None.  Patient does not have any family members left. Disposition Plan: Status is: Observation The patient will require care spanning > 2 midnights and should be moved to  inpatient because: Significant wheezing and unable to mobilize.     Consultants:  None  Procedures:  None  Antimicrobials:  Levaquin 12/7---   Subjective: Patient was seen and examined.  Patient tells me that due to his back he has hard time walking.  He starts telling a story from his childhood.  He was obviously wheezing, he tells me that his breathing is better.  Afebrile overnight.  On 2 L oxygen.  Agreeable to rehab placement.  Objective: Vitals:   03/12/22 0506 03/12/22 0613 03/12/22 0646 03/12/22 1139  BP:  (!) 160/112 (!) 160/112   Pulse:  98 98   Resp:  (!) 24 (!) 24   Temp: 98.3 F (36.8 C) 97.6 F (36.4 C) 97.6 F (36.4 C)   TempSrc: Oral Oral Oral   SpO2:  97%  96%  Weight:   104 kg   Height:   '5\' 10"'$  (1.778 m)     Intake/Output Summary (Last 24 hours) at 03/12/2022 1155 Last data filed at 03/12/2022 0925 Gross per 24 hour  Intake 625 ml  Output 100 ml  Net 525 ml   Filed Weights   03/12/22 0646  Weight: 104 kg    Examination:  General exam: Appears calm and comfortable  Chronically sick looking.  Frail.  Not in any distress.  Able to talk in complete sentences. Respiratory system: Good bilateral air entry with profound expiratory wheezes.  No crepitations. SpO2: 96 % O2 Flow Rate (L/min): 2 L/min  Cardiovascular system: S1 & S2 heard, RRR. No pedal edema. Gastrointestinal system: Soft.  Nontender.  Obese and pendulous. Central nervous system: Alert and oriented. No focal neurological deficits. Extremities: Symmetric 5 x 5 power. Skin: No rashes, lesions or  ulcers Psychiatry: Judgement and insight appear normal. Mood & affect appropriate.     Data Reviewed: I have personally reviewed following labs and imaging studies  CBC: Recent Labs  Lab 03/11/22 1130 03/12/22 0511  WBC 7.8 6.1  NEUTROABS 6.2  --   HGB 11.4* 10.3*  HCT 37.0* 33.6*  MCV 88.3 88.2  PLT 396 932   Basic Metabolic Panel: Recent Labs  Lab 03/11/22 1130  03/12/22 0511  NA 138 135  K 3.7 3.6  CL 108 104  CO2 21* 24  GLUCOSE 140* 140*  BUN 33* 28*  CREATININE 1.01 1.00  CALCIUM 8.8* 8.4*   GFR: Estimated Creatinine Clearance: 72.4 mL/min (by C-G formula based on SCr of 1 mg/dL). Liver Function Tests: Recent Labs  Lab 03/11/22 1130 03/12/22 0511  AST 20 21  ALT 16 14  ALKPHOS 83 78  BILITOT 0.6 0.6  PROT 6.7 6.2*  ALBUMIN 3.1* 2.6*   No results for input(s): "LIPASE", "AMYLASE" in the last 168 hours. No results for input(s): "AMMONIA" in the last 168 hours. Coagulation Profile: Recent Labs  Lab 03/11/22 1130  INR 1.1   Cardiac Enzymes: No results for input(s): "CKTOTAL", "CKMB", "CKMBINDEX", "TROPONINI" in the last 168 hours. BNP (last 3 results) No results for input(s): "PROBNP" in the last 8760 hours. HbA1C: No results for input(s): "HGBA1C" in the last 72 hours. CBG: No results for input(s): "GLUCAP" in the last 168 hours. Lipid Profile: No results for input(s): "CHOL", "HDL", "LDLCALC", "TRIG", "CHOLHDL", "LDLDIRECT" in the last 72 hours. Thyroid Function Tests: No results for input(s): "TSH", "T4TOTAL", "FREET4", "T3FREE", "THYROIDAB" in the last 72 hours. Anemia Panel: No results for input(s): "VITAMINB12", "FOLATE", "FERRITIN", "TIBC", "IRON", "RETICCTPCT" in the last 72 hours. Sepsis Labs: Recent Labs  Lab 03/11/22 1130 03/11/22 1535  LATICACIDVEN 1.4 1.1    Recent Results (from the past 240 hour(s))  Resp Panel by RT-PCR (Flu A&B, Covid) Anterior Nasal Swab     Status: None   Collection Time: 03/03/22 10:32 AM   Specimen: Anterior Nasal Swab  Result Value Ref Range Status   SARS Coronavirus 2 by RT PCR NEGATIVE NEGATIVE Final    Comment: (NOTE) SARS-CoV-2 target nucleic acids are NOT DETECTED.  The SARS-CoV-2 RNA is generally detectable in upper respiratory specimens during the acute phase of infection. The lowest concentration of SARS-CoV-2 viral copies this assay can detect is 138 copies/mL.  A negative result does not preclude SARS-Cov-2 infection and should not be used as the sole basis for treatment or other patient management decisions. A negative result may occur with  improper specimen collection/handling, submission of specimen other than nasopharyngeal swab, presence of viral mutation(s) within the areas targeted by this assay, and inadequate number of viral copies(<138 copies/mL). A negative result must be combined with clinical observations, patient history, and epidemiological information. The expected result is Negative.  Fact Sheet for Patients:  EntrepreneurPulse.com.au  Fact Sheet for Healthcare Providers:  IncredibleEmployment.be  This test is no t yet approved or cleared by the [place] FDA and  has been authorized for detection and/or diagnosis of SARS-CoV-2 by FDA under an Emergency Use Authorization (EUA). This EUA will remain  in effect (meaning this test can be used) for the duration of the COVID-19 declaration under Section 564(b)(1) of the Act, 21 U.S.C.section 360bbb-3(b)(1), unless the authorization is terminated  or revoked sooner.       Influenza A by PCR NEGATIVE NEGATIVE Final   Influenza B by PCR NEGATIVE NEGATIVE Final  Comment: (NOTE) The Xpert Xpress SARS-CoV-2/FLU/RSV plus assay is intended as an aid in the diagnosis of influenza from Nasopharyngeal swab specimens and should not be used as a sole basis for treatment. Nasal washings and aspirates are unacceptable for Xpert Xpress SARS-CoV-2/FLU/RSV testing.  Fact Sheet for Patients: EntrepreneurPulse.com.au  Fact Sheet for Healthcare Providers: IncredibleEmployment.be  This test is not yet approved or cleared by the [place] FDA and has been authorized for detection and/or diagnosis of SARS-CoV-2 by FDA under an Emergency Use Authorization (EUA). This EUA will remain in effect (meaning this test  can be used) for the duration of the COVID-19 declaration under Section 564(b)(1) of the Act, 21 U.S.C. section 360bbb-3(b)(1), unless the authorization is terminated or revoked.  Performed at [facility], [place] [address] [place], [place] [address]   Resp Panel by RT-PCR (Flu A&B, Covid)     Status: None   Collection Time: 03/11/22 11:06 AM   Specimen: Nasal Swab  Result Value Ref Range Status   SARS Coronavirus 2 by RT PCR NEGATIVE NEGATIVE Final    Comment: (NOTE) SARS-CoV-2 target nucleic acids are NOT DETECTED.  The SARS-CoV-2 RNA is generally detectable in upper respiratory specimens during the acute phase of infection. The lowest concentration of SARS-CoV-2 viral copies this assay can detect is 138 copies/mL. A negative result does not preclude SARS-Cov-2 infection and should not be used as the sole basis for treatment or other patient management decisions. A negative result may occur with  improper specimen collection/handling, submission of specimen other than nasopharyngeal swab, presence of viral mutation(s) within the areas targeted by this assay, and inadequate number of viral copies(<138 copies/mL). A negative result must be combined with clinical observations, patient history, and epidemiological information. The expected result is Negative.  Fact Sheet for Patients:  EntrepreneurPulse.com.au  Fact Sheet for Healthcare Providers:  IncredibleEmployment.be  This test is no t yet approved or cleared by the [place] FDA and  has been authorized for detection and/or diagnosis of SARS-CoV-2 by FDA under an Emergency Use Authorization (EUA). This EUA will remain  in effect (meaning this test can be used) for the duration of the COVID-19 declaration under Section 564(b)(1) of the Act, 21 U.S.C.section 360bbb-3(b)(1), unless the authorization is terminated  or revoked sooner.       Influenza A by PCR  NEGATIVE NEGATIVE Final   Influenza B by PCR NEGATIVE NEGATIVE Final    Comment: (NOTE) The Xpert Xpress SARS-CoV-2/FLU/RSV plus assay is intended as an aid in the diagnosis of influenza from Nasopharyngeal swab specimens and should not be used as a sole basis for treatment. Nasal washings and aspirates are unacceptable for Xpert Xpress SARS-CoV-2/FLU/RSV testing.  Fact Sheet for Patients: EntrepreneurPulse.com.au  Fact Sheet for Healthcare Providers: IncredibleEmployment.be  This test is not yet approved or cleared by the [place] FDA and has been authorized for detection and/or diagnosis of SARS-CoV-2 by FDA under an Emergency Use Authorization (EUA). This EUA will remain in effect (meaning this test can be used) for the duration of the COVID-19 declaration under Section 564(b)(1) of the Act, 21 U.S.C. section 360bbb-3(b)(1), unless the authorization is terminated or revoked.  Performed at [facility], [place] [address] [place], [place] [address]   Culture, blood (routine x 2)     Status: None (Preliminary result)   Collection Time: 03/11/22 11:30 AM   Specimen: BLOOD RIGHT FOREARM  Result Value Ref Range Status   Specimen Description   Final  BLOOD RIGHT FOREARM Performed at [place] [address], [place], [place] [address]    Special Requests   Final    BOTTLES DRAWN AEROBIC AND ANAEROBIC Blood Culture results may not be optimal due to an excessive volume of blood received in culture bottles Performed at [place] [address], [place], [place] [address]    Culture   Final    NO GROWTH < 24 HOURS Performed at [place] [address] [place], [place] [address]    Report Status PENDING  Incomplete  Culture, blood (routine x 2)     Status: None (Preliminary result)   Collection Time: 03/11/22 11:30 AM   Specimen: BLOOD RIGHT HAND  Result Value Ref  Range Status   Specimen Description   Final    BLOOD RIGHT HAND Performed at [place] [address], [place], [place] [address]    Special Requests   Final    BOTTLES DRAWN AEROBIC AND ANAEROBIC Blood Culture results may not be optimal due to an inadequate volume of blood received in culture bottles Performed at [place] [address], [place], [place] [address]    Culture   Final    NO GROWTH < 24 HOURS Performed at [place] [address] [place], [place] [address]    Report Status PENDING  Incomplete  SARS Coronavirus 2 by RT PCR (hospital order, performed in [facility] hospital lab) *cepheid single result test* Anterior Nasal Swab     Status: None   Collection Time: 03/11/22  3:35 PM   Specimen: Anterior Nasal Swab  Result Value Ref Range Status   SARS Coronavirus 2 by RT PCR NEGATIVE NEGATIVE Final    Comment: (NOTE) SARS-CoV-2 target nucleic acids are NOT DETECTED.  The SARS-CoV-2 RNA is generally detectable in upper and lower respiratory specimens during the acute phase of infection. The lowest concentration of SARS-CoV-2 viral copies this assay can detect is 250 copies / mL. A negative result does not preclude SARS-CoV-2 infection and should not be used as the sole basis for treatment or other patient management decisions.  A negative result may occur with improper specimen collection / handling, submission of specimen other than nasopharyngeal swab, presence of viral mutation(s) within the areas targeted by this assay, and inadequate number of viral copies (<250 copies / mL). A negative result must be combined with clinical observations, patient history, and epidemiological information.  Fact Sheet for Patients:   https://www.patel.info/  Fact Sheet for Healthcare Providers: https://hall.com/  This test is not yet approved or  cleared by the [place] FDA  and has been authorized for detection and/or diagnosis of SARS-CoV-2 by FDA under an Emergency Use Authorization (EUA).  This EUA will remain in effect (meaning this test can be used) for the duration of the COVID-19 declaration under Section 564(b)(1) of the Act, 21 U.S.C. section 360bbb-3(b)(1), unless the authorization is terminated or revoked sooner.  Performed at [facility], [place] [address] [place],  [address]          Radiology Studies: DG Chest 2 View  Result Date: 03/11/2022 CLINICAL DATA:  Shortness of breath for 1 week. EXAM: CHEST - 2 VIEW COMPARISON:  03/03/2022 FINDINGS: The cardio pericardial silhouette is enlarged. Interstitial markings are diffusely coarsened with chronic features. There is new bibasilar patchy airspace disease compatible with atelectasis and/or infiltrate. Small right pleural effusion noted. Bones are diffusely demineralized. IMPRESSION:  1. New bibasilar airspace disease compatible with atelectasis and/or infiltrate. 2. Small right pleural effusion. Electronically Signed   By: Misty Stanley M.D.   On: 03/11/2022 06:23        Scheduled Meds:  albuterol  2.5 mg Nebulization Q6H   diltiazem  120 mg Oral Daily   enoxaparin (LOVENOX) injection  40 mg Subcutaneous Q24H   guaiFENesin  1,200 mg Oral BID   levofloxacin  750 mg Oral Daily   pantoprazole  40 mg Oral Daily   predniSONE  40 mg Oral Daily   Continuous Infusions:   LOS: 0 days    Time spent: 35 minutes    Barb Merino, MD [facility] Pager [phone]

## 2022-03-12 NOTE — NC FL2 (Signed)
[place] LEVEL OF CARE FORM     IDENTIFICATION  Patient Name: Steven Simmons Birthdate: 1940-10-24 Sex: male Admission Date (Current Location): 03/11/2022  [facility] [place] Number:  Herbalist and Address:  [facility],  [address], [place]      Provider Number: [id number]  Attending Physician Name and Address:  Barb Merino, MD  Relative Name and Phone Number:       Current Level of Care: Hospital Recommended Level of Care: [place] Prior Approval Number:    Date Approved/Denied:   PASRR Number: [id number]  Discharge Plan: SNF    Current Diagnoses: Patient Active Problem List   Diagnosis Date Noted   COPD with acute exacerbation ([place]) 03/12/2022   Elevated brain natriuretic peptide (BNP) level 03/11/2022   Generalized weakness 03/11/2022   Housing instability 12/08/2021   Homelessness 12/08/2021   Financial insecurity 12/08/2021   Paroxysmal atrial fibrillation ([place]) 12/02/2021   Dyspnea on exertion 12/02/2021   Normocytic anemia 12/02/2021   Dyspnea 12/02/2021   Acute blood loss anemia    BRBPR (bright red blood per rectum) 10/01/2021   Atrial fibrillation with RVR ([place]) 10/01/2021   Prediabetes 11/16/2018   CAP (community acquired pneumonia) 08/23/2017   Appetite impaired 08/23/2017   Singultus, recent history 08/23/2017   Enlarged prostate on Pelvic CT 08/23/2017   GERD (gastroesophageal reflux disease) 08/22/2017   Hypokalemia 09/24/2016   Erectile dysfunction 12/28/2013   HLD (hyperlipidemia) 08/11/2012   HTN (hypertension) 06/29/2012    Orientation RESPIRATION BLADDER Height & Weight     Self, Time, Situation, Place  Normal Continent Weight: 229 lb 4.5 oz (104 kg) Height:  '5\' 10"'$  (177.8 cm)  BEHAVIORAL SYMPTOMS/MOOD NEUROLOGICAL BOWEL NUTRITION STATUS      Continent Diet (Regular)  AMBULATORY STATUS COMMUNICATION OF NEEDS Skin   Limited Assist Verbally Normal                        Personal Care Assistance Level of Assistance  Bathing, Feeding, Dressing Bathing Assistance: Limited assistance Feeding assistance: Independent Dressing Assistance: Limited assistance     Functional Limitations Info  Sight, Hearing, Speech Sight Info: Adequate Hearing Info: Impaired Speech Info: Adequate    SPECIAL CARE FACTORS FREQUENCY  OT (By licensed OT), PT (By licensed PT)     PT Frequency: 5x/wk OT Frequency: 5x/wk            Contractures Contractures Info: Not present    Additional Factors Info  Code Status, Allergies Code Status Info: DNR Allergies Info: Ceclor (Cefaclor), Veralipride, Penicillins, Amlodipine, Antihistamines, Chlorpheniramine-type, Beta Adrenergic Blockers, Celebrex (Celecoxib), Ibuprofen, Lipitor (Atorvastatin), Nutrasweet Aspartame (Aspartame)           Current Medications (03/12/2022):  This is the current hospital active medication list Current Facility-Administered Medications  Medication Dose Route Frequency Provider Last Rate Last Admin   acetaminophen (TYLENOL) tablet 650 mg  650 mg Oral Q6H PRN Marylyn Ishihara, Tyrone A, DO   650 mg at 03/11/22 2053   Or   acetaminophen (TYLENOL) suppository 650 mg  650 mg Rectal Q6H PRN Marylyn Ishihara, Tyrone A, DO       albuterol (PROVENTIL) (2.5 MG/3ML) 0.083% nebulizer solution 2.5 mg  2.5 mg Nebulization Q6H Ghimire, Dante Gang, MD   2.5 mg at 03/12/22 1138   diltiazem (CARDIZEM CD) 24 hr capsule 120 mg  120 mg Oral Daily Kyle, Tyrone A, DO   120 mg at 03/12/22 1007   enoxaparin (  LOVENOX) injection 40 mg  40 mg Subcutaneous Q24H Kyle, Tyrone A, DO   40 mg at 03/11/22 2231   guaiFENesin (MUCINEX) 12 hr tablet 1,200 mg  1,200 mg Oral BID Marylyn Ishihara, Tyrone A, DO   1,200 mg at 03/12/22 1007   ipratropium-albuterol (DUONEB) 0.5-2.5 (3) MG/3ML nebulizer solution 3 mL  3 mL Nebulization Q6H PRN Marylyn Ishihara, Tyrone A, DO       levofloxacin (LEVAQUIN) tablet 750 mg  750 mg Oral Daily Kyle, Tyrone A, DO   750 mg at 03/12/22 1007    pantoprazole (PROTONIX) EC tablet 40 mg  40 mg Oral Daily Kyle, Tyrone A, DO   40 mg at 03/12/22 1006   predniSONE (DELTASONE) tablet 40 mg  40 mg Oral Daily Barb Merino, MD   40 mg at 03/12/22 1007     Discharge Medications: Please see discharge summary for a list of discharge medications.  Relevant Imaging Results:  Relevant Lab Results:   Additional Information SSN: [id number]  Vassie Moselle, LCSW

## 2022-03-12 NOTE — TOC Initial Note (Signed)
Transition of Care [facility]) - Initial/Assessment Note    Patient Details  Name: Steven Simmons MRN: [id number] Date of Birth: 1940-11-03  [facility] CM/SW Contact:    Vassie Moselle, LCSW Phone Number: 03/12/2022, 3:05 PM  Clinical Narrative:                 Met with pt who is agreeable to SNF placement. Pt shares that he has not been to SNF in the past however, case workers at [facility] have tried to get him to go in the past. Pt shares he does not want to be around a lot of "looney" people and wants a good facility.  Referrals have been sent out for SNF and currently awaiting bed offers.   Expected Discharge Plan: Skilled Nursing Facility Barriers to Discharge: SNF Pending bed offer   Patient Goals and CMS Choice Patient states their goals for this hospitalization and ongoing recovery are:: To return home CMS Medicare.gov Compare Post Acute Care list provided to:: Patient Choice offered to / list presented to : Patient  Expected Discharge Plan and Services Expected Discharge Plan: [place] In-house Referral: NA Discharge Planning Services: CM Consult Post Acute Care Choice: [place] Living arrangements for the past 2 months: Single Family Home                 DME Arranged: N/A DME Agency: NA                  Prior Living Arrangements/Services Living arrangements for the past 2 months: Single Family Home Lives with:: Self Patient language and need for interpreter reviewed:: Yes Do you feel safe going back to the place where you live?: Yes      Need for Family Participation in Patient Care: No (Comment) Care giver support system in place?: No (comment)   Criminal Activity/Legal Involvement Pertinent to Current Situation/Hospitalization: No - Comment as needed  Activities of Daily Living Home Assistive Devices/Equipment: None ADL Screening (condition at time of admission) Patient's cognitive ability adequate to safely  complete daily activities?: Yes Is the patient deaf or have difficulty hearing?: Yes Does the patient have difficulty seeing, even when wearing glasses/contacts?: No Does the patient have difficulty concentrating, remembering, or making decisions?: No Patient able to express need for assistance with ADLs?: Yes Does the patient have difficulty dressing or bathing?: Yes Independently performs ADLs?: Yes (appropriate for developmental age) Does the patient have difficulty walking or climbing stairs?: Yes Weakness of Legs: Both Weakness of Arms/Hands: None  Permission Sought/Granted Permission sought to share information with : Facility Art therapist granted to share information with : Yes, Verbal Permission Granted     Permission granted to share info w AGENCY: SNF's        Emotional Assessment Appearance:: Appears stated age Attitude/Demeanor/Rapport: Engaged, Ambitious Affect (typically observed): Accepting Orientation: : Oriented to Self, Oriented to Place, Oriented to  Time, Oriented to Situation Alcohol / Substance Use: Not Applicable Psych Involvement: No (comment)  Admission diagnosis:  Dyspnea on exertion [R06.09] Community acquired pneumonia, unspecified laterality [J18.9] COPD with acute exacerbation ([place]) [J44.1] Patient Active Problem List   Diagnosis Date Noted   COPD with acute exacerbation ([place]) 03/12/2022   Elevated brain natriuretic peptide (BNP) level 03/11/2022   Generalized weakness 03/11/2022   Housing instability 12/08/2021   Homelessness 12/08/2021   Financial insecurity 12/08/2021   Paroxysmal atrial fibrillation ([place]) 12/02/2021   Dyspnea on exertion 12/02/2021   Normocytic anemia 12/02/2021  Dyspnea 12/02/2021   Acute blood loss anemia    BRBPR (bright red blood per rectum) 10/01/2021   Atrial fibrillation with RVR ([place]) 10/01/2021   Prediabetes 11/16/2018   CAP (community acquired pneumonia) 08/23/2017   Appetite impaired  08/23/2017   Singultus, recent history 08/23/2017   Enlarged prostate on Pelvic CT 08/23/2017   GERD (gastroesophageal reflux disease) 08/22/2017   Hypokalemia 09/24/2016   Erectile dysfunction 12/28/2013   HLD (hyperlipidemia) 08/11/2012   HTN (hypertension) 06/29/2012   PCP:  [name], MD Pharmacy:   [facility] [place] [address] Phone: [phone] Fax: [phone]     Social Determinants of Health (SDOH) Interventions Housing Interventions: Patient Refused  Readmission Risk Interventions     No data to display

## 2022-03-12 NOTE — ED Notes (Signed)
Patient placed on 2L [place] d/t oxygen saturation dropping to 81% while patient is asleep.

## 2022-03-12 NOTE — Progress Notes (Signed)
RN received Pt from ED. Pt presents with increased work of breathing on 2 L O2 [place] . Alert and oriented, Resp uneven and labored with exertion. Pt denies any pain at this time. Pt understands plan of care. Personal items within reach call bell within reach and bed alarm activated. Needs addressed at this time.

## 2022-03-13 DIAGNOSIS — R0609 Other forms of dyspnea: Secondary | ICD-10-CM | POA: Diagnosis not present

## 2022-03-13 MED ORDER — ALBUTEROL SULFATE (2.5 MG/3ML) 0.083% IN NEBU: 2.5000 mg | INHALATION_SOLUTION | Freq: Two times a day (BID) | RESPIRATORY_TRACT | Status: DC

## 2022-03-13 MED ORDER — ALBUTEROL SULFATE (2.5 MG/3ML) 0.083% IN NEBU
2.5000 mg | INHALATION_SOLUTION | Freq: Three times a day (TID) | RESPIRATORY_TRACT | Status: DC
  Administered 2022-03-13 – 2022-03-15 (×5): 2.5 mg via RESPIRATORY_TRACT
  Filled 2022-03-13 (×6): qty 3

## 2022-03-13 NOTE — Progress Notes (Signed)
PROGRESS NOTE    Steven Simmons  CNO:[id number] DOB: 01-Jun-1940 DOA: 03/11/2022 PCP: [name], MD    Brief Narrative:  81 year old with history of paroxysmal atrial fibrillation, COPD, hypertension and GERD presented to the emergency room with shortness of breath.  Apparently, he was having difficulty managing his symptoms at home.  He was not able to get around due to his back.  He was not able to reach further medications.  He called EMS for help and they reportedly found his resident was not inhabitable with dirt and feces.  Brought to the emergency room where he was profoundly wheezing and unable to walk.  Admitted with COPD exacerbation.   Assessment & Plan:   Acute COPD exacerbation: Bilateral lower lobe pneumonia versus atelectasis.  More likely atelectasis than pneumonia. Clinically improving.  Continue bronchodilator therapy, oral steroids, chest physiotherapy.   deep breathing exercises, incentive spirometry, chest physiotherapy.  Physical therapy and Occupational Therapy consultation. Antibiotics due to severity of symptoms.  Levaquin for 5 days. Supplemental oxygen to keep saturations more than 92%.  Paroxysmal atrial fibrillation: Currently in sinus rhythm.  Not on anticoagulation.  With his poor compliance and monitoring, will keep off anticoagulation.  Resume Cardizem.  Beta-blockers after wheezing has improved. Echocardiogram with ejection fraction of 55% this is comparable to his previous echocardiogram 4 months ago.  Chronic medical issues including Hypertension, stable GERD on PPI Stable.  Social: Poor living condition.  APS involved.  Working with PT OT.  In need for inpatient rehab.  Referred to skilled nursing.     DVT prophylaxis: enoxaparin (LOVENOX) injection 40 mg Start: 03/11/22 2200   Code Status: DNR Family Communication: None.  Patient does not have any family members left. Disposition Plan: Status is: Inpatient.  Unsafe discharge.  Medically  stable to discharge to SNF when bed available.   Consultants:  None  Procedures:  None  Antimicrobials:  Levaquin 12/7---   Subjective: Patient was seen and examined.  Less wheezing and short of breath than yesterday.  Had difficulty mobilizing but he tried his best.  Telemetry with sinus rhythm.  Objective: Vitals:   03/12/22 2028 03/13/22 0220 03/13/22 0501 03/13/22 0753  BP: (!) 155/86  131/73   Pulse: 89  92   Resp: 20  20   Temp: 98.1 F (36.7 C)  98 F (36.7 C)   TempSrc: Oral  Oral   SpO2: 99% 97% 99% 98%  Weight:      Height:        Intake/Output Summary (Last 24 hours) at 03/13/2022 1227 Last data filed at 03/13/2022 0500 Gross per 24 hour  Intake 473 ml  Output 400 ml  Net 73 ml   Filed Weights   03/12/22 0646  Weight: 104 kg    Examination:  General exam: Appears calm and comfortable, sitting in chair. Chronically sick looking.  Frail.  Not in any distress.  Able to talk in complete sentences. Respiratory system: Good bilateral air entry .  No added sounds. SpO2: 98 % O2 Flow Rate (L/min): 2 L/min  Cardiovascular system: S1 & S2 heard, RRR. No pedal edema. Gastrointestinal system: Soft.  Nontender.  Obese and pendulous. Central nervous system: Alert and oriented. No focal neurological deficits. Extremities: Symmetric 5 x 5 power. Skin: No rashes, lesions or ulcers Psychiatry: Judgement and insight appear normal. Mood & affect appropriate.     Data Reviewed: I have personally reviewed following labs and imaging studies  CBC: Recent Labs  Lab 03/11/22 1130 03/12/22 0511  WBC 7.8 6.1  NEUTROABS 6.2  --   HGB 11.4* 10.3*  HCT 37.0* 33.6*  MCV 88.3 88.2  PLT 396 505   Basic Metabolic Panel: Recent Labs  Lab 03/11/22 1130 03/12/22 0511  NA 138 135  K 3.7 3.6  CL 108 104  CO2 21* 24  GLUCOSE 140* 140*  BUN 33* 28*  CREATININE 1.01 1.00  CALCIUM 8.8* 8.4*   GFR: Estimated Creatinine Clearance: 72.4 mL/min (by C-G formula based  on SCr of 1 mg/dL). Liver Function Tests: Recent Labs  Lab 03/11/22 1130 03/12/22 0511  AST 20 21  ALT 16 14  ALKPHOS 83 78  BILITOT 0.6 0.6  PROT 6.7 6.2*  ALBUMIN 3.1* 2.6*   No results for input(s): "LIPASE", "AMYLASE" in the last 168 hours. No results for input(s): "AMMONIA" in the last 168 hours. Coagulation Profile: Recent Labs  Lab 03/11/22 1130  INR 1.1   Cardiac Enzymes: No results for input(s): "CKTOTAL", "CKMB", "CKMBINDEX", "TROPONINI" in the last 168 hours. BNP (last 3 results) No results for input(s): "PROBNP" in the last 8760 hours. HbA1C: No results for input(s): "HGBA1C" in the last 72 hours. CBG: No results for input(s): "GLUCAP" in the last 168 hours. Lipid Profile: No results for input(s): "CHOL", "HDL", "LDLCALC", "TRIG", "CHOLHDL", "LDLDIRECT" in the last 72 hours. Thyroid Function Tests: No results for input(s): "TSH", "T4TOTAL", "FREET4", "T3FREE", "THYROIDAB" in the last 72 hours. Anemia Panel: No results for input(s): "VITAMINB12", "FOLATE", "FERRITIN", "TIBC", "IRON", "RETICCTPCT" in the last 72 hours. Sepsis Labs: Recent Labs  Lab 03/11/22 1130 03/11/22 1535  LATICACIDVEN 1.4 1.1    Recent Results (from the past 240 hour(s))  Resp Panel by RT-PCR (Flu A&B, Covid)     Status: None   Collection Time: 03/11/22 11:06 AM   Specimen: Nasal Swab  Result Value Ref Range Status   SARS Coronavirus 2 by RT PCR NEGATIVE NEGATIVE Final    Comment: (NOTE) SARS-CoV-2 target nucleic acids are NOT DETECTED.  The SARS-CoV-2 RNA is generally detectable in upper respiratory specimens during the acute phase of infection. The lowest concentration of SARS-CoV-2 viral copies this assay can detect is 138 copies/mL. A negative result does not preclude SARS-Cov-2 infection and should not be used as the sole basis for treatment or other patient management decisions. A negative result may occur with  improper specimen collection/handling, submission of  specimen other than nasopharyngeal swab, presence of viral mutation(s) within the areas targeted by this assay, and inadequate number of viral copies(<138 copies/mL). A negative result must be combined with clinical observations, patient history, and epidemiological information. The expected result is Negative.  Fact Sheet for Patients:  EntrepreneurPulse.com.au  Fact Sheet for Healthcare Providers:  IncredibleEmployment.be  This test is no t yet approved or cleared by the [place] FDA and  has been authorized for detection and/or diagnosis of SARS-CoV-2 by FDA under an Emergency Use Authorization (EUA). This EUA will remain  in effect (meaning this test can be used) for the duration of the COVID-19 declaration under Section 564(b)(1) of the Act, 21 U.S.C.section 360bbb-3(b)(1), unless the authorization is terminated  or revoked sooner.       Influenza A by PCR NEGATIVE NEGATIVE Final   Influenza B by PCR NEGATIVE NEGATIVE Final    Comment: (NOTE) The Xpert Xpress SARS-CoV-2/FLU/RSV plus assay is intended as an aid in the diagnosis of influenza from Nasopharyngeal swab specimens and should not be used as a sole basis for treatment. Nasal washings and aspirates are  unacceptable for Xpert Xpress SARS-CoV-2/FLU/RSV testing.  Fact Sheet for Patients: EntrepreneurPulse.com.au  Fact Sheet for Healthcare Providers: IncredibleEmployment.be  This test is not yet approved or cleared by the [place] FDA and has been authorized for detection and/or diagnosis of SARS-CoV-2 by FDA under an Emergency Use Authorization (EUA). This EUA will remain in effect (meaning this test can be used) for the duration of the COVID-19 declaration under Section 564(b)(1) of the Act, 21 U.S.C. section 360bbb-3(b)(1), unless the authorization is terminated or revoked.  Performed at [facility], [place]  [address], [place], [place] [address]   Culture, blood (routine x 2)     Status: None (Preliminary result)   Collection Time: 03/11/22 11:30 AM   Specimen: BLOOD RIGHT FOREARM  Result Value Ref Range Status   Specimen Description   Final    BLOOD RIGHT FOREARM Performed at [facility] [address], [place], [place] [address]    Special Requests   Final    BOTTLES DRAWN AEROBIC AND ANAEROBIC Blood Culture results may not be optimal due to an excessive volume of blood received in culture bottles Performed at [facility] [address] [place], [place] [address]    Culture   Final    NO GROWTH 2 DAYS Performed at [place] [address], [place], [place] [address]    Report Status PENDING  Incomplete  Culture, blood (routine x 2)     Status: None (Preliminary result)   Collection Time: 03/11/22 11:30 AM   Specimen: BLOOD RIGHT HAND  Result Value Ref Range Status   Specimen Description   Final    BLOOD RIGHT HAND Performed at [place] [facility] [address] [place], [place] [address]    Special Requests   Final    BOTTLES DRAWN AEROBIC AND ANAEROBIC Blood Culture results may not be optimal due to an inadequate volume of blood received in culture bottles Performed at [facility] [address] [place], [place] [address]    Culture   Final    NO GROWTH 2 DAYS Performed at [place] [address] [place],  [address]    Report Status PENDING  Incomplete  SARS Coronavirus 2 by RT PCR (hospital order, performed in [facility] hospital lab) *cepheid single result test* Anterior Nasal Swab     Status: None   Collection Time: 03/11/22  3:35 PM   Specimen: Anterior Nasal Swab  Result Value Ref Range Status   SARS Coronavirus 2 by RT PCR NEGATIVE NEGATIVE Final    Comment: (NOTE) SARS-CoV-2 target nucleic acids are NOT DETECTED.  The SARS-CoV-2 RNA is generally detectable in  upper and lower respiratory specimens during the acute phase of infection. The lowest concentration of SARS-CoV-2 viral copies this assay can detect is 250 copies / mL. A negative result does not preclude SARS-CoV-2 infection and should not be used as the sole basis for treatment or other patient management decisions.  A negative result may occur with improper specimen collection / handling, submission of specimen other than nasopharyngeal swab, presence of viral mutation(s) within the areas targeted by this assay, and inadequate number of viral copies (<250 copies / mL). A negative result must be combined with clinical observations, patient history, and epidemiological information.  Fact Sheet for Patients:   https://www.patel.info/  Fact Sheet for Healthcare Providers: https://hall.com/  This test is not yet approved or  cleared by the [place] FDA  and has been authorized for detection and/or diagnosis of SARS-CoV-2 by FDA under an Emergency Use Authorization (EUA).  This EUA will remain in effect (meaning this test can be used) for the duration of the COVID-19 declaration under Section 564(b)(1) of the Act, 21 U.S.C. section 360bbb-3(b)(1), unless the authorization is terminated or revoked sooner.  Performed at [facility], [place] [address] [place], [place] [address]          Radiology Studies: ECHOCARDIOGRAM COMPLETE  Result Date: 03/12/2022    ECHOCARDIOGRAM REPORT   Patient Name:   Steven Simmons Date of Exam: 03/12/2022 Medical Rec #:  [id number]      Height:       70.0 in Accession #:    [id number]     Weight:       229.3 lb Date of Birth:  Jul 03, 1940       BSA:          2.212 m Patient Age:    58 years       BP:           160/112 mmHg Patient Gender: M              HR:           87 bpm. Exam Location:  Inpatient Procedure: 2D Echo, Cardiac Doppler and Color Doppler Indications:    CHF-Acute Diastolic X41.28   History:        Patient has prior history of Echocardiogram examinations, most                 recent 11/05/2021. TIA; Risk Factors:Hypertension and GERD.  Sonographer:    Bernadene Person RDCS Referring Phys: [id number] [place]  1. Left ventricular ejection fraction, by estimation, is 50 to 55%. The left ventricle has low normal function. The left ventricle has no regional wall motion abnormalities. There is mild left ventricular hypertrophy. Left ventricular diastolic parameters are indeterminate.  2. Right ventricular systolic function is mildly reduced. The right ventricular size is normal. There is moderately elevated pulmonary artery systolic pressure.  3. Left atrial size was moderately dilated.  4. Right atrial size was mild to moderately dilated.  5. Moderate pleural effusion.  6. The mitral valve is degenerative. Mild mitral valve regurgitation.  7. Aortic valve sclerosis/calcification is present, without any evidence of aortic stenosis.  8. There is Moderate (Grade III) plaque.  9. The inferior vena cava is dilated in size with <50% respiratory variability, suggesting right atrial pressure of 15 mmHg. Comparison(s): No significant change from prior study. FINDINGS  Left Ventricle: Left ventricular ejection fraction, by estimation, is 50 to 55%. The left ventricle has low normal function. The left ventricle has no regional wall motion abnormalities. The left ventricular internal cavity size was normal in size. There is mild left ventricular hypertrophy. Left ventricular diastolic parameters are indeterminate. Right Ventricle: The right ventricular size is normal. Right ventricular systolic function is mildly reduced. There is moderately elevated pulmonary artery systolic pressure. The tricuspid regurgitant velocity is 3.24 m/s, and with an assumed right atrial pressure of 15 mmHg, the estimated right ventricular systolic pressure is 09.4 mmHg. Left Atrium: Left atrial size was moderately  dilated. Right Atrium: Right atrial size was mild to moderately dilated. Pericardium: There is no evidence of pericardial effusion. Mitral Valve: The mitral valve is degenerative in appearance. There is mild thickening of the mitral valve leaflet(s). There is moderate calcification of the mitral valve leaflet(s). Mild mitral valve regurgitation.  Tricuspid Valve: Tricuspid valve regurgitation is mild. Aortic Valve: Aortic valve sclerosis/calcification is present, without any evidence of aortic stenosis. Aortic valve mean gradient measures 7.3 mmHg. Aortic valve peak gradient measures 12.5 mmHg. Aortic valve area, by VTI measures 2.45 cm. Pulmonic Valve: Pulmonic valve regurgitation is trivial. Aorta: The aortic root and ascending aorta are structurally normal, with no evidence of dilitation. There is moderate (Grade III) plaque. Venous: The inferior vena cava is dilated in size with less than 50% respiratory variability, suggesting right atrial pressure of 15 mmHg. IAS/Shunts: No atrial level shunt detected by color flow Doppler. Additional Comments: There is a moderate pleural effusion.  LEFT VENTRICLE PLAX 2D LVIDd:         4.60 cm LVIDs:         3.50 cm LV PW:         1.10 cm LV IVS:        1.10 cm LVOT diam:     2.10 cm LV SV:         86 LV SV Index:   39 LVOT Area:     3.46 cm  LV Volumes (MOD) LV vol d, MOD A2C: 110.0 ml LV vol d, MOD A4C: 96.6 ml LV vol s, MOD A2C: 54.7 ml LV vol s, MOD A4C: 47.9 ml LV SV MOD A2C:     55.3 ml LV SV MOD A4C:     96.6 ml LV SV MOD BP:      50.6 ml RIGHT VENTRICLE RV S prime:     9.45 cm/s TAPSE (M-mode): 1.4 cm LEFT ATRIUM              Index        RIGHT ATRIUM           Index LA diam:        4.00 cm  1.81 cm/m   RA Area:     23.50 cm LA Vol (A2C):   96.5 ml  43.62 ml/m  RA Volume:   74.60 ml  33.72 ml/m LA Vol (A4C):   103.0 ml 46.56 ml/m LA Biplane Vol: 100.0 ml 45.21 ml/m  AORTIC VALVE AV Area (Vmax):    2.61 cm AV Area (Vmean):   2.64 cm AV Area (VTI):     2.45 cm  AV Vmax:           176.67 cm/s AV Vmean:          128.000 cm/s AV VTI:            0.354 m AV Peak Grad:      12.5 mmHg AV Mean Grad:      7.3 mmHg LVOT Vmax:         133.33 cm/s LVOT Vmean:        97.700 cm/s LVOT VTI:          0.250 m LVOT/AV VTI ratio: 0.71  AORTA Ao Root diam: 3.60 cm Ao Asc diam:  3.40 cm TRICUSPID VALVE TR Peak grad:   42.0 mmHg TR Vmax:        324.00 cm/s  SHUNTS Systemic VTI:  0.25 m Systemic Diam: 2.10 cm Phineas Inches Electronically signed by Phineas Inches Signature Date/Time: 03/12/2022/4:50:31 PM    Final         Scheduled Meds:  albuterol  2.5 mg Nebulization TID   diltiazem  120 mg Oral Daily   enoxaparin (LOVENOX) injection  40 mg Subcutaneous Q24H   guaiFENesin  1,200 mg Oral BID  levofloxacin  750 mg Oral Daily   pantoprazole  40 mg Oral Daily   predniSONE  40 mg Oral Daily   Continuous Infusions:   LOS: 1 day    Time spent: 35 minutes    Barb Merino, MD [facility] Pager [phone]

## 2022-03-13 NOTE — Progress Notes (Signed)
Mobility Specialist - Progress Note   03/13/22 1549  Oxygen Therapy  O2 Device Nasal Cannula  O2 Flow Rate (L/min) 2 L/min  Mobility  Activity Ambulated with assistance in hallway  Level of Assistance Standby assist, set-up cues, supervision of patient - no hands on  Assistive Device Front wheel walker  Distance Ambulated (ft) 160 ft  Activity Response Tolerated well  Mobility Referral Yes  $Mobility charge 1 Mobility   Pt received in bed and agreeable to mobility. Pt took 3 standing rest breaks due to SOB during session. No other complaints during session. Pt to recliner after session with all needs met.   Pre-mobility: 97bpm HR, 92% SpO2 During mobility: 105bpm HR, 96% SpO2 Post-mobility: 70bpm HR, 92% SPO2  Steven Simmons  Mobility Specialist  

## 2022-03-14 DIAGNOSIS — R0609 Other forms of dyspnea: Secondary | ICD-10-CM | POA: Diagnosis not present

## 2022-03-14 MED ORDER — SALINE SPRAY 0.65 % NA SOLN
1.0000 | NASAL | Status: DC | PRN
  Administered 2022-03-14: 1 via NASAL
  Filled 2022-03-14: qty 44

## 2022-03-14 MED ORDER — ALUM & MAG HYDROXIDE-SIMETH 200-200-20 MG/5ML PO SUSP
30.0000 mL | Freq: Four times a day (QID) | ORAL | Status: DC | PRN
  Administered 2022-03-14: 30 mL via ORAL
  Filled 2022-03-14: qty 30

## 2022-03-14 NOTE — Evaluation (Signed)
Occupational Therapy Evaluation Patient Details Name: Steven Simmons MRN: [id number] DOB: 22-Mar-1941 Today's Date: 03/14/2022   History of Present Illness Patient is a 81 year old male who presented with dyspnea on exertion. patient was found to have COPD exacerbation. PMH: arthritis, UTI, migraine, GERD, aspiration pneumonia, heart murmur, a fib.   Clinical Impression   Patient is a 81 year old male who was admitted for above. Patient was living at home alone with no AD prior level. Currently, patient is noted to require supplemental O2 with decreased functional activity tolerance impacting participation in ADLs. Patient requires increased physical assistance for transfers, LB bathing/dressing and maintaining standing balance impacting safety with transition back to PLOF. Patient would continue to benefit from skilled OT services at this time while admitted and after d/c to address noted deficits in order to improve overall safety and independence in ADLs.       Recommendations for follow up therapy are one component of a multi-disciplinary discharge planning process, led by the attending physician.  Recommendations may be updated based on patient status, additional functional criteria and insurance authorization.   Follow Up Recommendations  Skilled nursing-short term rehab (<3 hours/day)     Assistance Recommended at Discharge Frequent or constant Supervision/Assistance  Patient can return home with the following A little help with walking and/or transfers;A lot of help with bathing/dressing/bathroom;Assistance with cooking/housework;Direct supervision/assist for medications management;Assist for transportation;Help with stairs or ramp for entrance;Direct supervision/assist for financial management    Functional Status Assessment  Patient has had a recent decline in their functional status and demonstrates the ability to make significant improvements in function in a reasonable and  predictable amount of time.  Equipment Recommendations  Other (comment) (defer to next venue)       Precautions / Restrictions Precautions Precautions: Fall Precaution Comments: monitor O2 Restrictions Weight Bearing Restrictions: No      Mobility Bed Mobility Overal bed mobility: Needs Assistance Bed Mobility: Supine to Sit, Sit to Supine     Supine to sit: Min assist     General bed mobility comments: with increased time and cues to advance to the edge of bed.    Transfers   Equipment used: Rolling walker (2 wheels) Transfers: Sit to/from Stand Sit to Stand: Min assist        Balance Overall balance assessment: Needs assistance Sitting-balance support: Feet supported Sitting balance-Leahy Scale: Good     Standing balance support: During functional activity, Bilateral upper extremity supported, Reliant on assistive device for balance Standing balance-Leahy Scale: Poor           ADL either performed or assessed with clinical judgement   ADL Overall ADL's : Needs assistance/impaired Eating/Feeding: Set up;Sitting   Grooming: Set up;Sitting Grooming Details (indicate cue type and reason): educated on importance of brushing teeth and tounge every day. patient verbalized understanding but declined to complete at this time. Upper Body Bathing: Minimal assistance;Sitting   Lower Body Bathing: Minimal assistance;Sitting/lateral leans   Upper Body Dressing : Minimal assistance;Sitting   Lower Body Dressing: Moderate assistance;Sitting/lateral leans   Toilet Transfer: Minimal assistance;Ambulation;Rolling walker (2 wheels) Toilet Transfer Details (indicate cue type and reason): patient was min a to take small side steps up head of bed with increased time and cues for safety. patient declined to sit up in chair. consistent redirection with patient cosnsistently talking during session. Toileting- Clothing Manipulation and Hygiene: Maximal assistance;Sit to/from  stand       Functional mobility during ADLs: Moderate assistance;Rolling walker (2  wheels)       Vision Patient Visual Report: No change from baseline              Pertinent Vitals/Pain Pain Assessment Pain Assessment: Faces Faces Pain Scale: Hurts a little bit Pain Location: burning in "stomach ulcers" Pain Descriptors / Indicators: Burning Pain Intervention(s): Limited activity within patient's tolerance        Extremity/Trunk Assessment Upper Extremity Assessment Upper Extremity Assessment: Overall WFL for tasks assessed   Lower Extremity Assessment Lower Extremity Assessment: Defer to PT evaluation   Cervical / Trunk Assessment Cervical / Trunk Assessment: Kyphotic   Communication Communication Communication: No difficulties   Cognition Arousal/Alertness: Awake/alert Behavior During Therapy: WFL for tasks assessed/performed Overall Cognitive Status: Within Functional Limits for tasks assessed                 General Comments: patient was noted to be easily distracted with tangents on medical care. alert and oriented to place, person and purpose.                Home Living Family/patient expects to be discharged to:: Skilled nursing facility Living Arrangements: Alone                Prior Functioning/Environment Prior Level of Function : Independent/Modified Independent             Mobility Comments: pt reports limited ambulation in the home without AD, denies falls ADLs Comments: pt reports ind with bathing, dressing, toileting, orders meals via [employer]        OT Problem List: Decreased activity tolerance;Impaired balance (sitting and/or standing);Decreased coordination;Decreased knowledge of precautions;Decreased knowledge of use of DME or AE;Cardiopulmonary status limiting activity      OT Treatment/Interventions: Self-care/ADL training;Energy conservation;Therapeutic exercise;DME and/or AE instruction;Therapeutic  activities;Patient/family education;Balance training    OT Goals(Current goals can be found in the care plan section) Acute Rehab OT Goals Patient Stated Goal: to get better OT Goal Formulation: With patient Time For Goal Achievement: 03/28/22 Potential to Achieve Goals: Fair  OT Frequency: Min 2X/week       AM-PAC OT "6 Clicks" Daily Activity     Outcome Measure Help from another person eating meals?: None Help from another person taking care of personal grooming?: A Little Help from another person toileting, which includes using toliet, bedpan, or urinal?: A Lot Help from another person bathing (including washing, rinsing, drying)?: A Lot Help from another person to put on and taking off regular upper body clothing?: A Little Help from another person to put on and taking off regular lower body clothing?: A Lot 6 Click Score: 16   End of Session Equipment Utilized During Treatment: Rolling walker (2 wheels) Nurse Communication: Mobility status;Other (comment) (request for anti acid)  Activity Tolerance: Patient tolerated treatment well Patient left: in bed;with call bell/phone within reach  OT Visit Diagnosis: Unsteadiness on feet (R26.81);Other abnormalities of gait and mobility (R26.89)                Time: 9147-8295 OT Time Calculation (min): 34 min Charges:  OT General Charges $OT Visit: 1 Visit OT Evaluation $OT Eval Moderate Complexity: 1 Mod OT Treatments $Self Care/Home Management : 8-22 mins  Rennie Plowman, MS Acute Rehabilitation Department Office# [phone]   Willa Rough 03/14/2022, 3:32 PM

## 2022-03-14 NOTE — Progress Notes (Signed)
PROGRESS NOTE    Steven Simmons  PFX:[id number] DOB: 04/30/1940 DOA: 03/11/2022 PCP: [name], MD    Brief Narrative:  81 year old with history of paroxysmal atrial fibrillation, COPD, hypertension and GERD presented to the emergency room with shortness of breath.  Apparently, he was having difficulty managing his symptoms at home.  He was not able to get around due to his back.  He was not able to reach further medications.  He called EMS for help and they reportedly found his resident was not inhabitable with dirt and feces.  Brought to the emergency room where he was profoundly wheezing and unable to walk.  Admitted with COPD exacerbation.   Assessment & Plan:   Acute COPD exacerbation: Bilateral lower lobe pneumonia versus atelectasis.  More likely atelectasis than pneumonia. Clinically improving.  Continue bronchodilator therapy, oral steroids, chest physiotherapy.   deep breathing exercises, incentive spirometry,. Physical therapy and Occupational Therapy to continue mobility. Antibiotics due to severity of symptoms.  Levaquin for 5 days. Supplemental oxygen to keep saturations more than 92%.  Paroxysmal atrial fibrillation: Currently in sinus rhythm.  Not on anticoagulation.  With his poor compliance and monitoring, will keep off anticoagulation.  Resume Cardizem.  Beta-blockers after wheezing has improved. Echocardiogram with ejection fraction of 79% this is comparable to his previous echocardiogram 4 months ago.  Chronic medical issues including Hypertension, stable GERD on PPI Stable.  Social: Poor living condition.  APS involved.  Working with PT OT.  In need for inpatient rehab.  Referred to skilled nursing.     DVT prophylaxis: enoxaparin (LOVENOX) injection 40 mg Start: 03/11/22 2200   Code Status: DNR Family Communication: None.  Patient does not have any family members left. Disposition Plan: Status is: Inpatient.  Unsafe discharge.  Medically stable to  discharge to SNF when bed available.   Consultants:  None  Procedures:  None  Antimicrobials:  Levaquin 12/7---   Subjective:  Patient was seen and examined.  He tells me that he feels tired and had poor sleep last night.  Denies any chest pain but ongoing wheezing.  Dry cough.  Afebrile.  Objective: Vitals:   03/13/22 2000 03/14/22 0447 03/14/22 0552 03/14/22 0602  BP: 130/68 (!) 171/89 (!) 162/105   Pulse: 90 84 77   Resp: 19 (!) 21    Temp: 98.7 F (37.1 C) 98.6 F (37 C)    TempSrc: Oral Oral    SpO2: 97% 96% 98% 96%  Weight:      Height:        Intake/Output Summary (Last 24 hours) at 03/14/2022 1134 Last data filed at 03/14/2022 0800 Gross per 24 hour  Intake 240 ml  Output 600 ml  Net -360 ml    Filed Weights   03/12/22 0646  Weight: 104 kg    Examination:  General exam: Appears anxious today.  Comfortable on minimal oxygen through nose. Chronically sick looking.  Frail.  Not in any distress.  Able to talk in complete sentences. Respiratory system: Good bilateral air entry .  Occasional expiratory wheezes. SpO2: 96 % O2 Flow Rate (L/min): 3 L/min  Cardiovascular system: S1 & S2 heard, RRR. No pedal edema. Gastrointestinal system: Soft.  Nontender.  Obese and pendulous. Central nervous system: Alert and oriented. No focal neurological deficits. Extremities: Symmetric 5 x 5 power. Skin: No rashes, lesions or ulcers Psychiatry: Judgement and insight appear normal. Mood & affect appropriate.     Data Reviewed: I have personally reviewed following labs and imaging studies  CBC: Recent Labs  Lab 03/11/22 1130 03/12/22 0511  WBC 7.8 6.1  NEUTROABS 6.2  --   HGB 11.4* 10.3*  HCT 37.0* 33.6*  MCV 88.3 88.2  PLT 396 425    Basic Metabolic Panel: Recent Labs  Lab 03/11/22 1130 03/12/22 0511  NA 138 135  K 3.7 3.6  CL 108 104  CO2 21* 24  GLUCOSE 140* 140*  BUN 33* 28*  CREATININE 1.01 1.00  CALCIUM 8.8* 8.4*    GFR: Estimated  Creatinine Clearance: 72.4 mL/min (by C-G formula based on SCr of 1 mg/dL). Liver Function Tests: Recent Labs  Lab 03/11/22 1130 03/12/22 0511  AST 20 21  ALT 16 14  ALKPHOS 83 78  BILITOT 0.6 0.6  PROT 6.7 6.2*  ALBUMIN 3.1* 2.6*    No results for input(s): "LIPASE", "AMYLASE" in the last 168 hours. No results for input(s): "AMMONIA" in the last 168 hours. Coagulation Profile: Recent Labs  Lab 03/11/22 1130  INR 1.1    Cardiac Enzymes: No results for input(s): "CKTOTAL", "CKMB", "CKMBINDEX", "TROPONINI" in the last 168 hours. BNP (last 3 results) No results for input(s): "PROBNP" in the last 8760 hours. HbA1C: No results for input(s): "HGBA1C" in the last 72 hours. CBG: No results for input(s): "GLUCAP" in the last 168 hours. Lipid Profile: No results for input(s): "CHOL", "HDL", "LDLCALC", "TRIG", "CHOLHDL", "LDLDIRECT" in the last 72 hours. Thyroid Function Tests: No results for input(s): "TSH", "T4TOTAL", "FREET4", "T3FREE", "THYROIDAB" in the last 72 hours. Anemia Panel: No results for input(s): "VITAMINB12", "FOLATE", "FERRITIN", "TIBC", "IRON", "RETICCTPCT" in the last 72 hours. Sepsis Labs: Recent Labs  Lab 03/11/22 1130 03/11/22 1535  LATICACIDVEN 1.4 1.1     Recent Results (from the past 240 hour(s))  Resp Panel by RT-PCR (Flu A&B, Covid)     Status: None   Collection Time: 03/11/22 11:06 AM   Specimen: Nasal Swab  Result Value Ref Range Status   SARS Coronavirus 2 by RT PCR NEGATIVE NEGATIVE Final    Comment: (NOTE) SARS-CoV-2 target nucleic acids are NOT DETECTED.  The SARS-CoV-2 RNA is generally detectable in upper respiratory specimens during the acute phase of infection. The lowest concentration of SARS-CoV-2 viral copies this assay can detect is 138 copies/mL. A negative result does not preclude SARS-Cov-2 infection and should not be used as the sole basis for treatment or other patient management decisions. A negative result may occur with   improper specimen collection/handling, submission of specimen other than nasopharyngeal swab, presence of viral mutation(s) within the areas targeted by this assay, and inadequate number of viral copies(<138 copies/mL). A negative result must be combined with clinical observations, patient history, and epidemiological information. The expected result is Negative.  Fact Sheet for Patients:  EntrepreneurPulse.com.au  Fact Sheet for Healthcare Providers:  IncredibleEmployment.be  This test is no t yet approved or cleared by the [place] FDA and  has been authorized for detection and/or diagnosis of SARS-CoV-2 by FDA under an Emergency Use Authorization (EUA). This EUA will remain  in effect (meaning this test can be used) for the duration of the COVID-19 declaration under Section 564(b)(1) of the Act, 21 U.S.C.section 360bbb-3(b)(1), unless the authorization is terminated  or revoked sooner.       Influenza A by PCR NEGATIVE NEGATIVE Final   Influenza B by PCR NEGATIVE NEGATIVE Final    Comment: (NOTE) The Xpert Xpress SARS-CoV-2/FLU/RSV plus assay is intended as an aid in the diagnosis of influenza from Nasopharyngeal swab specimens and  should not be used as a sole basis for treatment. Nasal washings and aspirates are unacceptable for Xpert Xpress SARS-CoV-2/FLU/RSV testing.  Fact Sheet for Patients: EntrepreneurPulse.com.au  Fact Sheet for Healthcare Providers: IncredibleEmployment.be  This test is not yet approved or cleared by the [place] FDA and has been authorized for detection and/or diagnosis of SARS-CoV-2 by FDA under an Emergency Use Authorization (EUA). This EUA will remain in effect (meaning this test can be used) for the duration of the COVID-19 declaration under Section 564(b)(1) of the Act, 21 U.S.C. section 360bbb-3(b)(1), unless the authorization is terminated  or revoked.  Performed at [facility], [place] [address] [place], [place] [address]   Culture, blood (routine x 2)     Status: None (Preliminary result)   Collection Time: 03/11/22 11:30 AM   Specimen: BLOOD RIGHT FOREARM  Result Value Ref Range Status   Specimen Description   Final    BLOOD RIGHT FOREARM Performed at [facility] [place] [address], [place], [place] [address]    Special Requests   Final    BOTTLES DRAWN AEROBIC AND ANAEROBIC Blood Culture results may not be optimal due to an excessive volume of blood received in culture bottles Performed at [facility] [address], [place], [place] [address]    Culture   Final    NO GROWTH 3 DAYS Performed at [facility], [place] [address], [place], [place] [address]    Report Status PENDING  Incomplete  Culture, blood (routine x 2)     Status: None (Preliminary result)   Collection Time: 03/11/22 11:30 AM   Specimen: BLOOD RIGHT HAND  Result Value Ref Range Status   Specimen Description   Final    BLOOD RIGHT HAND Performed at [facility] [address], [place], [place] [address]    Special Requests   Final    BOTTLES DRAWN AEROBIC AND ANAEROBIC Blood Culture results may not be optimal due to an inadequate volume of blood received in culture bottles Performed at [facility] [address], [place], [place] [address]    Culture   Final    NO GROWTH 3 DAYS Performed at [facility], [place] [address] [place], [place] [address]    Report Status PENDING  Incomplete  SARS Coronavirus 2 by RT PCR (hospital order, performed in [facility] hospital lab) *cepheid single result test* Anterior Nasal Swab     Status: None   Collection Time: 03/11/22  3:35 PM   Specimen: Anterior Nasal Swab  Result Value Ref Range Status   SARS Coronavirus 2 by RT PCR NEGATIVE NEGATIVE Final    Comment: (NOTE) SARS-CoV-2 target nucleic acids  are NOT DETECTED.  The SARS-CoV-2 RNA is generally detectable in upper and lower respiratory specimens during the acute phase of infection. The lowest concentration of SARS-CoV-2 viral copies this assay can detect is 250 copies / mL. A negative result does not preclude SARS-CoV-2 infection and should not be used as the sole basis for treatment or other patient management decisions.  A negative result may occur with improper specimen collection / handling, submission of specimen other than nasopharyngeal swab, presence of viral mutation(s) within the areas targeted by this assay, and inadequate number of viral copies (<250 copies / mL). A negative result must be combined with clinical observations, patient history, and epidemiological information.  Fact Sheet for Patients:   https://www.patel.info/  Fact Sheet for Healthcare Providers: https://hall.com/  This test is not yet approved or  cleared by the [place] and has been authorized for detection and/or diagnosis of SARS-CoV-2 by FDA under an Emergency Use Authorization (EUA).  This EUA will remain in effect (meaning this test can be used) for the duration of the COVID-19 declaration under Section 564(b)(1) of the Act, 21 U.S.C. section 360bbb-3(b)(1), unless the authorization is terminated or revoked sooner.  Performed at [facility], [place] [address] [place], [place] [address]          Radiology Studies: ECHOCARDIOGRAM COMPLETE  Result Date: 03/12/2022    ECHOCARDIOGRAM REPORT   Patient Name:   LEGRAND LASSER Date of Exam: 03/12/2022 Medical Rec #:  [id number]      Height:       70.0 in Accession #:    [id number]     Weight:       229.3 lb Date of Birth:  [birth year]       BSA:          2.212 m Patient Age:    58 years       BP:           160/112 mmHg Patient Gender: M              HR:           87 bpm. Exam Location:  Inpatient Procedure: 2D Echo, Cardiac  Doppler and Color Doppler Indications:    CHF-Acute Diastolic F74.94  History:        Patient has prior history of Echocardiogram examinations, most                 recent 11/05/2021. TIA; Risk Factors:Hypertension and GERD.  Sonographer:    Bernadene Person RDCS Referring Phys: [id number] [place]  1. Left ventricular ejection fraction, by estimation, is 50 to 55%. The left ventricle has low normal function. The left ventricle has no regional wall motion abnormalities. There is mild left ventricular hypertrophy. Left ventricular diastolic parameters are indeterminate.  2. Right ventricular systolic function is mildly reduced. The right ventricular size is normal. There is moderately elevated pulmonary artery systolic pressure.  3. Left atrial size was moderately dilated.  4. Right atrial size was mild to moderately dilated.  5. Moderate pleural effusion.  6. The mitral valve is degenerative. Mild mitral valve regurgitation.  7. Aortic valve sclerosis/calcification is present, without any evidence of aortic stenosis.  8. There is Moderate (Grade III) plaque.  9. The inferior vena cava is dilated in size with <50% respiratory variability, suggesting right atrial pressure of 15 mmHg. Comparison(s): No significant change from prior study. FINDINGS  Left Ventricle: Left ventricular ejection fraction, by estimation, is 50 to 55%. The left ventricle has low normal function. The left ventricle has no regional wall motion abnormalities. The left ventricular internal cavity size was normal in size. There is mild left ventricular hypertrophy. Left ventricular diastolic parameters are indeterminate. Right Ventricle: The right ventricular size is normal. Right ventricular systolic function is mildly reduced. There is moderately elevated pulmonary artery systolic pressure. The tricuspid regurgitant velocity is 3.24 m/s, and with an assumed right atrial pressure of 15 mmHg, the estimated right ventricular systolic  pressure is 63.8 mmHg. Left Atrium: Left atrial size was moderately dilated. Right Atrium: Right atrial size was mild to moderately dilated. Pericardium: There is no evidence of pericardial effusion. Mitral Valve: The mitral valve is degenerative in appearance. There is mild thickening of the mitral valve  leaflet(s). There is moderate calcification of the mitral valve leaflet(s). Mild mitral valve regurgitation. Tricuspid Valve: Tricuspid valve regurgitation is mild. Aortic Valve: Aortic valve sclerosis/calcification is present, without any evidence of aortic stenosis. Aortic valve mean gradient measures 7.3 mmHg. Aortic valve peak gradient measures 12.5 mmHg. Aortic valve area, by VTI measures 2.45 cm. Pulmonic Valve: Pulmonic valve regurgitation is trivial. Aorta: The aortic root and ascending aorta are structurally normal, with no evidence of dilitation. There is moderate (Grade III) plaque. Venous: The inferior vena cava is dilated in size with less than 50% respiratory variability, suggesting right atrial pressure of 15 mmHg. IAS/Shunts: No atrial level shunt detected by color flow Doppler. Additional Comments: There is a moderate pleural effusion.  LEFT VENTRICLE PLAX 2D LVIDd:         4.60 cm LVIDs:         3.50 cm LV PW:         1.10 cm LV IVS:        1.10 cm LVOT diam:     2.10 cm LV SV:         86 LV SV Index:   39 LVOT Area:     3.46 cm  LV Volumes (MOD) LV vol d, MOD A2C: 110.0 ml LV vol d, MOD A4C: 96.6 ml LV vol s, MOD A2C: 54.7 ml LV vol s, MOD A4C: 47.9 ml LV SV MOD A2C:     55.3 ml LV SV MOD A4C:     96.6 ml LV SV MOD BP:      50.6 ml RIGHT VENTRICLE RV S prime:     9.45 cm/s TAPSE (M-mode): 1.4 cm LEFT ATRIUM              Index        RIGHT ATRIUM           Index LA diam:        4.00 cm  1.81 cm/m   RA Area:     23.50 cm LA Vol (A2C):   96.5 ml  43.62 ml/m  RA Volume:   74.60 ml  33.72 ml/m LA Vol (A4C):   103.0 ml 46.56 ml/m LA Biplane Vol: 100.0 ml 45.21 ml/m  AORTIC VALVE AV Area (Vmax):     2.61 cm AV Area (Vmean):   2.64 cm AV Area (VTI):     2.45 cm AV Vmax:           176.67 cm/s AV Vmean:          128.000 cm/s AV VTI:            0.354 m AV Peak Grad:      12.5 mmHg AV Mean Grad:      7.3 mmHg LVOT Vmax:         133.33 cm/s LVOT Vmean:        97.700 cm/s LVOT VTI:          0.250 m LVOT/AV VTI ratio: 0.71  AORTA Ao Root diam: 3.60 cm Ao Asc diam:  3.40 cm TRICUSPID VALVE TR Peak grad:   42.0 mmHg TR Vmax:        324.00 cm/s  SHUNTS Systemic VTI:  0.25 m Systemic Diam: 2.10 cm Placido Sou signed by Phineas Inches Signature Date/Time: 03/12/2022/4:50:31 PM    Final         Scheduled Meds:  albuterol  2.5 mg Nebulization TID   diltiazem  120 mg Oral Daily   enoxaparin (LOVENOX) injection  40 mg Subcutaneous Q24H   guaiFENesin  1,200 mg Oral BID   levofloxacin  750 mg Oral Daily   pantoprazole  40 mg Oral Daily   predniSONE  40 mg Oral Daily   Continuous Infusions:   LOS: 2 days    Time spent: 35 minutes    Barb Merino, MD [facility] Pager [phone]

## 2022-03-15 ENCOUNTER — Encounter (HOSPITAL_COMMUNITY): Payer: Self-pay | Admitting: Internal Medicine

## 2022-03-15 ENCOUNTER — Other Ambulatory Visit (HOSPITAL_COMMUNITY): Payer: Self-pay

## 2022-03-15 DIAGNOSIS — M6259 Muscle wasting and atrophy, not elsewhere classified, multiple sites: Secondary | ICD-10-CM | POA: Diagnosis not present

## 2022-03-15 DIAGNOSIS — Z79899 Other long term (current) drug therapy: Secondary | ICD-10-CM | POA: Diagnosis not present

## 2022-03-15 DIAGNOSIS — M150 Primary generalized (osteo)arthritis: Secondary | ICD-10-CM | POA: Diagnosis not present

## 2022-03-15 DIAGNOSIS — R600 Localized edema: Secondary | ICD-10-CM | POA: Diagnosis not present

## 2022-03-15 DIAGNOSIS — E785 Hyperlipidemia, unspecified: Secondary | ICD-10-CM | POA: Diagnosis not present

## 2022-03-15 DIAGNOSIS — R0902 Hypoxemia: Secondary | ICD-10-CM | POA: Diagnosis not present

## 2022-03-15 DIAGNOSIS — R531 Weakness: Secondary | ICD-10-CM | POA: Diagnosis not present

## 2022-03-15 DIAGNOSIS — J441 Chronic obstructive pulmonary disease with (acute) exacerbation: Secondary | ICD-10-CM | POA: Diagnosis not present

## 2022-03-15 DIAGNOSIS — J449 Chronic obstructive pulmonary disease, unspecified: Secondary | ICD-10-CM | POA: Diagnosis not present

## 2022-03-15 DIAGNOSIS — R2689 Other abnormalities of gait and mobility: Secondary | ICD-10-CM | POA: Diagnosis not present

## 2022-03-15 DIAGNOSIS — Z743 Need for continuous supervision: Secondary | ICD-10-CM | POA: Diagnosis not present

## 2022-03-15 DIAGNOSIS — R0609 Other forms of dyspnea: Secondary | ICD-10-CM | POA: Diagnosis not present

## 2022-03-15 DIAGNOSIS — K277 Chronic peptic ulcer, site unspecified, without hemorrhage or perforation: Secondary | ICD-10-CM | POA: Diagnosis not present

## 2022-03-15 DIAGNOSIS — K2100 Gastro-esophageal reflux disease with esophagitis, without bleeding: Secondary | ICD-10-CM | POA: Diagnosis not present

## 2022-03-15 DIAGNOSIS — L988 Other specified disorders of the skin and subcutaneous tissue: Secondary | ICD-10-CM | POA: Diagnosis not present

## 2022-03-15 DIAGNOSIS — R41841 Cognitive communication deficit: Secondary | ICD-10-CM | POA: Diagnosis not present

## 2022-03-15 DIAGNOSIS — R278 Other lack of coordination: Secondary | ICD-10-CM | POA: Diagnosis not present

## 2022-03-15 DIAGNOSIS — G43809 Other migraine, not intractable, without status migrainosus: Secondary | ICD-10-CM | POA: Diagnosis not present

## 2022-03-15 DIAGNOSIS — I10 Essential (primary) hypertension: Secondary | ICD-10-CM | POA: Diagnosis not present

## 2022-03-15 DIAGNOSIS — M6281 Muscle weakness (generalized): Secondary | ICD-10-CM | POA: Diagnosis not present

## 2022-03-15 DIAGNOSIS — I480 Paroxysmal atrial fibrillation: Secondary | ICD-10-CM | POA: Diagnosis not present

## 2022-03-15 MED ORDER — METHYLPREDNISOLONE 4 MG PO TBPK
ORAL_TABLET | ORAL | 0 refills | Status: DC
  Filled 2022-03-15: qty 21, 6d supply, fill #0

## 2022-03-15 MED ORDER — LEVOFLOXACIN 750 MG PO TABS
750.0000 mg | ORAL_TABLET | Freq: Every day | ORAL | 0 refills | Status: AC
  Filled 2022-03-15: qty 1, 1d supply, fill #0

## 2022-03-15 NOTE — TOC Transition Note (Signed)
Transition of Care [facility]) - CM/SW Discharge Note   Patient Details  Name: Steven Simmons MRN: [id number] Date of Birth: 1941/03/07  Transition of Care [facility]) CM/SW Contact:  Vassie Moselle, LCSW Phone Number: 03/15/2022, 11:59 AM   Clinical Narrative:    Met with pt to review bed offers for SNF. Pt accepted bed offer to [facility] and is able to transfer to their facility today. Pt will be going to room 606. RN to call report to [phone].  [facility] will be arranged to transport pt to SNF.   Final next level of care: Skilled Nursing Facility Barriers to Discharge: Barriers Resolved   Patient Goals and CMS Choice Patient states their goals for this hospitalization and ongoing recovery are:: To return home CMS Medicare.gov Compare Post Acute Care list provided to:: Patient Choice offered to / list presented to : Patient  Discharge Placement PASRR number recieved: 03/12/22            Patient chooses bed at: [facility] Patient to be transferred to facility by: [place] Name of family member notified: N/A Patient and family notified of of transfer: 03/15/22  Discharge Plan and Services In-house Referral: NA Discharge Planning Services: CM Consult Post Acute Care Choice: [place]          DME Arranged: N/A DME Agency: NA                  Social Determinants of Health ([place]) Interventions Housing Interventions: Patient Refused   Readmission Risk Interventions     No data to display

## 2022-03-15 NOTE — Plan of Care (Signed)
  Problem: Activity: °Goal: Ability to tolerate increased activity will improve °Outcome: Completed/Met °  °Problem: Clinical Measurements: °Goal: Ability to maintain a body temperature in the normal range will improve °Outcome: Completed/Met °  °Problem: Respiratory: °Goal: Ability to maintain adequate ventilation will improve °Outcome: Completed/Met °Goal: Ability to maintain a clear airway will improve °Outcome: Completed/Met °  °Problem: Education: °Goal: Knowledge of General Education information will improve °Description: Including pain rating scale, medication(s)/side effects and non-pharmacologic comfort measures °Outcome: Completed/Met °  °Problem: Health Behavior/Discharge Planning: °Goal: Ability to manage health-related needs will improve °Outcome: Completed/Met °  °Problem: Clinical Measurements: °Goal: Ability to maintain clinical measurements within normal limits will improve °Outcome: Completed/Met °Goal: Will remain free from infection °Outcome: Completed/Met °Goal: Diagnostic test results will improve °Outcome: Completed/Met °Goal: Respiratory complications will improve °Outcome: Completed/Met °Goal: Cardiovascular complication will be avoided °Outcome: Completed/Met °  °Problem: Activity: °Goal: Risk for activity intolerance will decrease °Outcome: Completed/Met °  °Problem: Nutrition: °Goal: Adequate nutrition will be maintained °Outcome: Completed/Met °  °Problem: Coping: °Goal: Level of anxiety will decrease °Outcome: Completed/Met °  °Problem: Elimination: °Goal: Will not experience complications related to bowel motility °Outcome: Completed/Met °Goal: Will not experience complications related to urinary retention °Outcome: Completed/Met °  °Problem: Pain Managment: °Goal: General experience of comfort will improve °Outcome: Completed/Met °  °Problem: Safety: °Goal: Ability to remain free from injury will improve °Outcome: Completed/Met °  °Problem: Skin Integrity: °Goal: Risk for impaired  skin integrity will decrease °Outcome: Completed/Met °  °

## 2022-03-15 NOTE — Care Management Important Message (Signed)
Important Message  Patient Details IM Letter given. Name: Steven Simmons MRN: [id number] Date of Birth: 10-08-1940   Medicare Important Message Given:  Yes     Kerin Salen 03/15/2022, 11:50 AM

## 2022-03-15 NOTE — Discharge Summary (Signed)
Physician Discharge Summary  Marcelus Dubberly TMH:[id number] DOB: April 27, 1940 DOA: 03/11/2022  PCP: [name], MD  Admit date: 03/11/2022 Discharge date: 03/15/2022  Admitted From: Home Disposition:  SNF  Recommendations for Outpatient Follow-up:  Follow up with PCP in 1-2 weeks  Discharge Condition:Stable  CODE STATUS:DNR  Diet recommendation: Regular diet as tolerated    Brief/Interim Summary: 81 year old with history of paroxysmal atrial fibrillation, COPD, hypertension and GERD presented to the emergency room with shortness of breath.  Apparently, he was having difficulty managing his symptoms at home.  He was not able to get around due to his back.  He was not able to reach further medications.  He called EMS for help and they reportedly found his resident was not inhabitable with dirt and feces.  Brought to the emergency room where he was profoundly wheezing and unable to walk.  Admitted with COPD exacerbation.   Patient remains medically stable for discharge - currently accepted to SNF for ongoing treatment and evaluation given below.  Discharge Diagnoses:  Principal Problem:   Dyspnea on exertion Active Problems:   HTN (hypertension)   GERD (gastroesophageal reflux disease)   CAP (community acquired pneumonia)   Paroxysmal atrial fibrillation (HCC)   Elevated brain natriuretic peptide (BNP) level   Generalized weakness   COPD with acute exacerbation (HCC)  Acute COPD exacerbation: Bilateral lower lobe pneumonia versus atelectasis.  More likely atelectasis than pneumonia. Clinically improving.   -Steroid taper - last abx dose 03/16/22   Paroxysmal atrial fibrillation:  Currently in sinus rhythm - continue diltiazem Echocardiogram with ejection fraction of 55% this is comparable to his previous echocardiogram 4 months ago.   Hypertension, stable GERD on PPI   Social issues likely delaying discharge Poor living condition.  APS involved.  Discharging to SNF - likely  unable to care for himself given evaluation with PT/OT.  Discharge Instructions  Discharge Instructions     Discharge patient   Complete by: As directed    Discharge disposition: 03-[facility] [place]   Discharge patient date: 03/15/2022      Allergies as of 03/15/2022       Reactions   Ceclor [cefaclor] Other (See Comments)   Makes my heart slow and I feel like I am fading away   Veralipride Other (See Comments)   Gums bleed, tooth aches   Penicillins Rash, Other (See Comments)   Has patient had a PCN reaction causing immediate rash, facial/tongue/throat swelling, SOB or lightheadedness with hypotension: yes- only rash Has patient had a PCN reaction causing severe rash involving mucus membranes or skin necrosis: No Has patient had a PCN reaction that required hospitalization: No Has patient had a PCN reaction occurring within the last 10 years: No If all of the above answers are "NO", then may proceed with Cephalosporin use.   Amlodipine Other (See Comments)   Gum bleeding   Antihistamines, Chlorpheniramine-type Other (See Comments)   Dizziness and nose bleeds. "Spaced out"    Beta Adrenergic Blockers Other (See Comments)   Gums bleed   Celebrex [celecoxib] Other (See Comments)   Stomach pain   Ibuprofen Other (See Comments)   Stomach pain     Lipitor [atorvastatin] Hypertension   Nutrasweet Aspartame [aspartame] Other (See Comments)   Memory loss. "Spaced out"         Medication List     STOP taking these medications    carvedilol 6.25 MG tablet Commonly known as: COREG       TAKE these medications  acetaminophen 500 MG tablet Commonly known as: TYLENOL Take 1,000 mg by mouth daily as needed for mild pain.   diltiazem 120 MG 24 hr capsule Commonly known as: CARDIZEM CD Take 1 capsule (120 mg total) by mouth daily.   levofloxacin 750 MG tablet Commonly known as: LEVAQUIN Take 1 tablet (750 mg total) by mouth daily for 1 day. Take 03/16/22 -  this is your last dose Start taking on: March 16, 2022   methylPREDNISolone 4 MG Tbpk tablet Commonly known as: MEDROL DOSEPAK Taper as directed   pantoprazole 40 MG tablet Commonly known as: PROTONIX Take 1 tablet (40 mg total) by mouth daily.   potassium chloride SA 20 MEQ tablet Commonly known as: KLOR-CON M Take 1 tablet (20 mEq total) by mouth daily for 7 days.        Allergies  Allergen Reactions   Ceclor [Cefaclor] Other (See Comments)    Makes my heart slow and I feel like I am fading away   Veralipride Other (See Comments)    Gums bleed, tooth aches   Penicillins Rash and Other (See Comments)     Has patient had a PCN reaction causing immediate rash, facial/tongue/throat swelling, SOB or lightheadedness with hypotension: yes- only rash Has patient had a PCN reaction causing severe rash involving mucus membranes or skin necrosis: No Has patient had a PCN reaction that required hospitalization: No Has patient had a PCN reaction occurring within the last 10 years: No If all of the above answers are "NO", then may proceed with Cephalosporin use.   Amlodipine Other (See Comments)    Gum bleeding   Antihistamines, Chlorpheniramine-Type Other (See Comments)    Dizziness and nose bleeds. "Spaced out"    Beta Adrenergic Blockers Other (See Comments)    Gums bleed   Celebrex [Celecoxib] Other (See Comments)    Stomach pain   Ibuprofen Other (See Comments)    Stomach pain     Lipitor [Atorvastatin] Hypertension   Nutrasweet Aspartame [Aspartame] Other (See Comments)    Memory loss. "Spaced out"     Consultations: None   Procedures/Studies: ECHOCARDIOGRAM COMPLETE  Result Date: 03/12/2022    ECHOCARDIOGRAM REPORT   Patient Name:   HILERY WINTLE Date of Exam: 03/12/2022 Medical Rec #:  [id number]      Height:       70.0 in Accession #:    [id number]     Weight:       229.3 lb Date of Birth:  [birth year]       BSA:          2.212 m Patient Age:    81 years       BP:            160/112 mmHg Patient Gender: M              HR:           87 bpm. Exam Location:  Inpatient Procedure: 2D Echo, Cardiac Doppler and Color Doppler Indications:    CHF-Acute Diastolic T02.40  History:        Patient has prior history of Echocardiogram examinations, most                 recent 11/05/2021. TIA; Risk Factors:Hypertension and GERD.  Sonographer:    Bernadene Person RDCS Referring Phys: [id number] [place]  1. Left ventricular ejection fraction, by estimation, is 50 to 55%. The left ventricle has low normal function. The left ventricle has no  regional wall motion abnormalities. There is mild left ventricular hypertrophy. Left ventricular diastolic parameters are indeterminate.  2. Right ventricular systolic function is mildly reduced. The right ventricular size is normal. There is moderately elevated pulmonary artery systolic pressure.  3. Left atrial size was moderately dilated.  4. Right atrial size was mild to moderately dilated.  5. Moderate pleural effusion.  6. The mitral valve is degenerative. Mild mitral valve regurgitation.  7. Aortic valve sclerosis/calcification is present, without any evidence of aortic stenosis.  8. There is Moderate (Grade III) plaque.  9. The inferior vena cava is dilated in size with <50% respiratory variability, suggesting right atrial pressure of 15 mmHg. Comparison(s): No significant change from prior study. FINDINGS  Left Ventricle: Left ventricular ejection fraction, by estimation, is 50 to 55%. The left ventricle has low normal function. The left ventricle has no regional wall motion abnormalities. The left ventricular internal cavity size was normal in size. There is mild left ventricular hypertrophy. Left ventricular diastolic parameters are indeterminate. Right Ventricle: The right ventricular size is normal. Right ventricular systolic function is mildly reduced. There is moderately elevated pulmonary artery systolic pressure. The tricuspid  regurgitant velocity is 3.24 m/s, and with an assumed right atrial pressure of 15 mmHg, the estimated right ventricular systolic pressure is 56.2 mmHg. Left Atrium: Left atrial size was moderately dilated. Right Atrium: Right atrial size was mild to moderately dilated. Pericardium: There is no evidence of pericardial effusion. Mitral Valve: The mitral valve is degenerative in appearance. There is mild thickening of the mitral valve leaflet(s). There is moderate calcification of the mitral valve leaflet(s). Mild mitral valve regurgitation. Tricuspid Valve: Tricuspid valve regurgitation is mild. Aortic Valve: Aortic valve sclerosis/calcification is present, without any evidence of aortic stenosis. Aortic valve mean gradient measures 7.3 mmHg. Aortic valve peak gradient measures 12.5 mmHg. Aortic valve area, by VTI measures 2.45 cm. Pulmonic Valve: Pulmonic valve regurgitation is trivial. Aorta: The aortic root and ascending aorta are structurally normal, with no evidence of dilitation. There is moderate (Grade III) plaque. Venous: The inferior vena cava is dilated in size with less than 50% respiratory variability, suggesting right atrial pressure of 15 mmHg. IAS/Shunts: No atrial level shunt detected by color flow Doppler. Additional Comments: There is a moderate pleural effusion.  LEFT VENTRICLE PLAX 2D LVIDd:         4.60 cm LVIDs:         3.50 cm LV PW:         1.10 cm LV IVS:        1.10 cm LVOT diam:     2.10 cm LV SV:         86 LV SV Index:   39 LVOT Area:     3.46 cm  LV Volumes (MOD) LV vol d, MOD A2C: 110.0 ml LV vol d, MOD A4C: 96.6 ml LV vol s, MOD A2C: 54.7 ml LV vol s, MOD A4C: 47.9 ml LV SV MOD A2C:     55.3 ml LV SV MOD A4C:     96.6 ml LV SV MOD BP:      50.6 ml RIGHT VENTRICLE RV S prime:     9.45 cm/s TAPSE (M-mode): 1.4 cm LEFT ATRIUM              Index        RIGHT ATRIUM           Index LA diam:        4.00 cm  1.81 cm/m   RA Area:     23.50 cm LA Vol (A2C):   96.5 ml  43.62 ml/m  RA  Volume:   74.60 ml  33.72 ml/m LA Vol (A4C):   103.0 ml 46.56 ml/m LA Biplane Vol: 100.0 ml 45.21 ml/m  AORTIC VALVE AV Area (Vmax):    2.61 cm AV Area (Vmean):   2.64 cm AV Area (VTI):     2.45 cm AV Vmax:           176.67 cm/s AV Vmean:          128.000 cm/s AV VTI:            0.354 m AV Peak Grad:      12.5 mmHg AV Mean Grad:      7.3 mmHg LVOT Vmax:         133.33 cm/s LVOT Vmean:        97.700 cm/s LVOT VTI:          0.250 m LVOT/AV VTI ratio: 0.71  AORTA Ao Root diam: 3.60 cm Ao Asc diam:  3.40 cm TRICUSPID VALVE TR Peak grad:   42.0 mmHg TR Vmax:        324.00 cm/s  SHUNTS Systemic VTI:  0.25 m Systemic Diam: 2.10 cm Phineas Inches Electronically signed by Phineas Inches Signature Date/Time: 03/12/2022/4:50:31 PM    Final    DG Chest 2 View  Result Date: 03/11/2022 CLINICAL DATA:  Shortness of breath for 1 week. EXAM: CHEST - 2 VIEW COMPARISON:  03/03/2022 FINDINGS: The cardio pericardial silhouette is enlarged. Interstitial markings are diffusely coarsened with chronic features. There is new bibasilar patchy airspace disease compatible with atelectasis and/or infiltrate. Small right pleural effusion noted. Bones are diffusely demineralized. IMPRESSION: 1. New bibasilar airspace disease compatible with atelectasis and/or infiltrate. 2. Small right pleural effusion. Electronically Signed   By: Misty Stanley M.D.   On: 03/11/2022 06:23   CT Angio Chest PE W and/or Wo Contrast  Result Date: 03/03/2022 CLINICAL DATA:  Intermittent shortness of breath since July, worse over the past few days. EXAM: CT ANGIOGRAPHY CHEST WITH CONTRAST TECHNIQUE: Multidetector CT imaging of the chest was performed using the standard protocol during bolus administration of intravenous contrast. Multiplanar CT image reconstructions and MIPs were obtained to evaluate the vascular anatomy. RADIATION DOSE REDUCTION: This exam was performed according to the departmental dose-optimization program which includes automated exposure  control, adjustment of the mA and/or kV according to patient size and/or use of iterative reconstruction technique. CONTRAST:  65m OMNIPAQUE IOHEXOL 350 MG/ML SOLN COMPARISON:  Chest x-ray from same day. FINDINGS: Cardiovascular: Satisfactory opacification of the pulmonary arteries to the segmental level. No evidence of pulmonary embolism. Mild cardiomegaly. No pericardial effusion. No thoracic aortic aneurysm or dissection. Coronary, aortic arch, and branch vessel atherosclerotic vascular disease. Mediastinum/Nodes: Single mildly enlarged right paratracheal lymph node measuring 1.2 cm in short axis, presumably reactive. No enlarged hilar or axillary lymph nodes. The thyroid gland, trachea, and esophagus demonstrate no significant findings. Lungs/Pleura: Trace to small bilateral pleural effusions. Scattered mild smooth interlobular septal thickening. No consolidation or pneumothorax. Upper Abdomen: No acute abnormality. Musculoskeletal: No chest wall abnormality. No acute or significant osseous findings. Review of the MIP images confirms the above findings. IMPRESSION: 1. No evidence of pulmonary embolism. 2. Mild congestive heart failure. 3.  Aortic atherosclerosis (ICD10-I70.0). Electronically Signed   By: WTitus DubinM.D.   On: 03/03/2022 13:44   DG Chest Portable 1 View  Result  Date: 03/03/2022 CLINICAL DATA:  Dyspnea EXAM: PORTABLE CHEST 1 VIEW COMPARISON:  01/31/2022 FINDINGS: Previously noted diffuse interstitial pulmonary infiltrate and bilateral pleural effusions resolved. Lungs are clear. No pneumothorax or pleural effusion. Cardiac size is within normal limits. Pulmonary vascularity is normal. No acute bone abnormality. IMPRESSION: 1. No active disease. Electronically Signed   By: Fidela Salisbury M.D.   On: 03/03/2022 11:05     Subjective: No acute issue/events overnight   Discharge Exam: Vitals:   03/15/22 0628 03/15/22 0840  BP: (!) 147/81   Pulse: (!) 101   Resp: 18   Temp: 98.2 F  (36.8 C)   SpO2: (!) 87% 96%   Vitals:   03/14/22 1448 03/14/22 2200 03/15/22 0628 03/15/22 0840  BP:  122/76 (!) 147/81   Pulse:  61 (!) 101   Resp:  20 18   Temp:  98.6 F (37 C) 98.2 F (36.8 C)   TempSrc:  Oral Oral   SpO2: 97% 98% (!) 87% 96%  Weight:      Height:        General: Pt is alert, awake, not in acute distress Cardiovascular: RRR, S1/S2 +, no rubs, no gallops Respiratory: CTA bilaterally, no wheezing, no rhonchi Abdominal: Soft, NT, ND, bowel sounds + Extremities: no edema, no cyanosis    The results of significant diagnostics from this hospitalization (including imaging, microbiology, ancillary and laboratory) are listed below for reference.     Microbiology: Recent Results (from the past 240 hour(s))  Resp Panel by RT-PCR (Flu A&B, Covid)     Status: None   Collection Time: 03/11/22 11:06 AM   Specimen: Nasal Swab  Result Value Ref Range Status   SARS Coronavirus 2 by RT PCR NEGATIVE NEGATIVE Final    Comment: (NOTE) SARS-CoV-2 target nucleic acids are NOT DETECTED.  The SARS-CoV-2 RNA is generally detectable in upper respiratory specimens during the acute phase of infection. The lowest concentration of SARS-CoV-2 viral copies this assay can detect is 138 copies/mL. A negative result does not preclude SARS-Cov-2 infection and should not be used as the sole basis for treatment or other patient management decisions. A negative result may occur with  improper specimen collection/handling, submission of specimen other than nasopharyngeal swab, presence of viral mutation(s) within the areas targeted by this assay, and inadequate number of viral copies(<138 copies/mL). A negative result must be combined with clinical observations, patient history, and epidemiological information. The expected result is Negative.  Fact Sheet for Patients:  EntrepreneurPulse.com.au  Fact Sheet for Healthcare Providers:   IncredibleEmployment.be  This test is no t yet approved or cleared by the [place] FDA and  has been authorized for detection and/or diagnosis of SARS-CoV-2 by FDA under an Emergency Use Authorization (EUA). This EUA will remain  in effect (meaning this test can be used) for the duration of the COVID-19 declaration under Section 564(b)(1) of the Act, 21 U.S.C.section 360bbb-3(b)(1), unless the authorization is terminated  or revoked sooner.       Influenza A by PCR NEGATIVE NEGATIVE Final   Influenza B by PCR NEGATIVE NEGATIVE Final    Comment: (NOTE) The Xpert Xpress SARS-CoV-2/FLU/RSV plus assay is intended as an aid in the diagnosis of influenza from Nasopharyngeal swab specimens and should not be used as a sole basis for treatment. Nasal washings and aspirates are unacceptable for Xpert Xpress SARS-CoV-2/FLU/RSV testing.  Fact Sheet for Patients: EntrepreneurPulse.com.au  Fact Sheet for Healthcare Providers: IncredibleEmployment.be  This test is not yet approved or cleared  by the [place] and has been authorized for detection and/or diagnosis of SARS-CoV-2 by FDA under an Emergency Use Authorization (EUA). This EUA will remain in effect (meaning this test can be used) for the duration of the COVID-19 declaration under Section 564(b)(1) of the Act, 21 U.S.C. section 360bbb-3(b)(1), unless the authorization is terminated or revoked.  Performed at [facility], [place] [address], [place], [place] [address]   Culture, blood (routine x 2)     Status: None (Preliminary result)   Collection Time: 03/11/22 11:30 AM   Specimen: BLOOD RIGHT FOREARM  Result Value Ref Range Status   Specimen Description   Final    BLOOD RIGHT FOREARM Performed at [facility] [address], [place], [place] [address]    Special Requests   Final    BOTTLES DRAWN AEROBIC AND ANAEROBIC Blood  Culture results may not be optimal due to an excessive volume of blood received in culture bottles Performed at [facility] [address] [place], [place] [address]    Culture   Final    NO GROWTH 4 DAYS Performed at [facility], [place] [address], [place], [place] [address]    Report Status PENDING  Incomplete  Culture, blood (routine x 2)     Status: None (Preliminary result)   Collection Time: 03/11/22 11:30 AM   Specimen: BLOOD RIGHT HAND  Result Value Ref Range Status   Specimen Description   Final    BLOOD RIGHT HAND Performed at [facility] [address], [place], [place] [address]    Special Requests   Final    BOTTLES DRAWN AEROBIC AND ANAEROBIC Blood Culture results may not be optimal due to an inadequate volume of blood received in culture bottles Performed at [facility] [address], [place], [place] [address]    Culture   Final    NO GROWTH 4 DAYS Performed at [facility], [place] [address] [place], [place] [address]    Report Status PENDING  Incomplete  SARS Coronavirus 2 by RT PCR (hospital order, performed in [facility] hospital lab) *cepheid single result test* Anterior Nasal Swab     Status: None   Collection Time: 03/11/22  3:35 PM   Specimen: Anterior Nasal Swab  Result Value Ref Range Status   SARS Coronavirus 2 by RT PCR NEGATIVE NEGATIVE Final    Comment: (NOTE) SARS-CoV-2 target nucleic acids are NOT DETECTED.  The SARS-CoV-2 RNA is generally detectable in upper and lower respiratory specimens during the acute phase of infection. The lowest concentration of SARS-CoV-2 viral copies this assay can detect is 250 copies / mL. A negative result does not preclude SARS-CoV-2 infection and should not be used as the sole basis for treatment or other patient management decisions.  A negative result may occur with improper specimen collection / handling, submission of specimen  other than nasopharyngeal swab, presence of viral mutation(s) within the areas targeted by this assay, and inadequate number of viral copies (<250 copies / mL). A negative result must be combined with clinical observations, patient history, and epidemiological information.  Fact Sheet for Patients:   https://www.patel.info/  Fact Sheet for Healthcare Providers: https://hall.com/  This test is not yet approved or  cleared by the [place] FDA and has been authorized for detection and/or diagnosis of SARS-CoV-2 by FDA under an Emergency Use Authorization (EUA).  This EUA will remain in effect (meaning this test  can be used) for the duration of the COVID-19 declaration under Section 564(b)(1) of the Act, 21 U.S.C. section 360bbb-3(b)(1), unless the authorization is terminated or revoked sooner.  Performed at [facility], [place] [address], [place], [place] [address]      Labs: BNP (last 3 results) Recent Labs    12/05/21 1222 03/03/22 1048 03/11/22 1130  BNP 447.2* 522.6* 956.3*   Basic Metabolic Panel: Recent Labs  Lab 03/11/22 1130 03/12/22 0511  NA 138 135  K 3.7 3.6  CL 108 104  CO2 21* 24  GLUCOSE 140* 140*  BUN 33* 28*  CREATININE 1.01 1.00  CALCIUM 8.8* 8.4*   Liver Function Tests: Recent Labs  Lab 03/11/22 1130 03/12/22 0511  AST 20 21  ALT 16 14  ALKPHOS 83 78  BILITOT 0.6 0.6  PROT 6.7 6.2*  ALBUMIN 3.1* 2.6*   No results for input(s): "LIPASE", "AMYLASE" in the last 168 hours. No results for input(s): "AMMONIA" in the last 168 hours. CBC: Recent Labs  Lab 03/11/22 1130 03/12/22 0511  WBC 7.8 6.1  NEUTROABS 6.2  --   HGB 11.4* 10.3*  HCT 37.0* 33.6*  MCV 88.3 88.2  PLT 396 338   Cardiac Enzymes: No results for input(s): "CKTOTAL", "CKMB", "CKMBINDEX", "TROPONINI" in the last 168 hours. BNP: Invalid input(s): "POCBNP" CBG: No results for input(s): "GLUCAP" in the last  168 hours. D-Dimer No results for input(s): "DDIMER" in the last 72 hours. Hgb A1c No results for input(s): "HGBA1C" in the last 72 hours. Lipid Profile No results for input(s): "CHOL", "HDL", "LDLCALC", "TRIG", "CHOLHDL", "LDLDIRECT" in the last 72 hours. Thyroid function studies No results for input(s): "TSH", "T4TOTAL", "T3FREE", "THYROIDAB" in the last 72 hours.  Invalid input(s): "FREET3" Anemia work up No results for input(s): "VITAMINB12", "FOLATE", "FERRITIN", "TIBC", "IRON", "RETICCTPCT" in the last 72 hours. Urinalysis    Component Value Date/Time   COLORURINE YELLOW 03/11/2022 1040   APPEARANCEUR CLEAR 03/11/2022 1040   LABSPEC 1.027 03/11/2022 1040   PHURINE 5.0 03/11/2022 1040   GLUCOSEU NEGATIVE 03/11/2022 1040   HGBUR NEGATIVE 03/11/2022 1040   BILIRUBINUR NEGATIVE 03/11/2022 1040   KETONESUR 5 (A) 03/11/2022 1040   PROTEINUR 30 (A) 03/11/2022 1040   UROBILINOGEN 1.0 09/13/2013 1954   NITRITE NEGATIVE 03/11/2022 1040   LEUKOCYTESUR SMALL (A) 03/11/2022 1040   Sepsis Labs Recent Labs  Lab 03/11/22 1130 03/12/22 0511  WBC 7.8 6.1   Microbiology Recent Results (from the past 240 hour(s))  Resp Panel by RT-PCR (Flu A&B, Covid)     Status: None   Collection Time: 03/11/22 11:06 AM   Specimen: Nasal Swab  Result Value Ref Range Status   SARS Coronavirus 2 by RT PCR NEGATIVE NEGATIVE Final    Comment: (NOTE) SARS-CoV-2 target nucleic acids are NOT DETECTED.  The SARS-CoV-2 RNA is generally detectable in upper respiratory specimens during the acute phase of infection. The lowest concentration of SARS-CoV-2 viral copies this assay can detect is 138 copies/mL. A negative result does not preclude SARS-Cov-2 infection and should not be used as the sole basis for treatment or other patient management decisions. A negative result may occur with  improper specimen collection/handling, submission of specimen other than nasopharyngeal swab, presence of viral  mutation(s) within the areas targeted by this assay, and inadequate number of viral copies(<138 copies/mL). A negative result must be combined with clinical observations, patient history, and epidemiological information. The expected result is Negative.  Fact Sheet for Patients:  EntrepreneurPulse.com.au  Fact Sheet for Healthcare Providers:  IncredibleEmployment.be  This test is no t yet approved or cleared by the [place] FDA and  has been authorized for detection and/or diagnosis of SARS-CoV-2 by FDA under an Emergency Use Authorization (EUA). This EUA will remain  in effect (meaning this test can be used) for the duration of the COVID-19 declaration under Section 564(b)(1) of the Act, 21 U.S.C.section 360bbb-3(b)(1), unless the authorization is terminated  or revoked sooner.       Influenza A by PCR NEGATIVE NEGATIVE Final   Influenza B by PCR NEGATIVE NEGATIVE Final    Comment: (NOTE) The Xpert Xpress SARS-CoV-2/FLU/RSV plus assay is intended as an aid in the diagnosis of influenza from Nasopharyngeal swab specimens and should not be used as a sole basis for treatment. Nasal washings and aspirates are unacceptable for Xpert Xpress SARS-CoV-2/FLU/RSV testing.  Fact Sheet for Patients: EntrepreneurPulse.com.au  Fact Sheet for Healthcare Providers: IncredibleEmployment.be  This test is not yet approved or cleared by the [place] FDA and has been authorized for detection and/or diagnosis of SARS-CoV-2 by FDA under an Emergency Use Authorization (EUA). This EUA will remain in effect (meaning this test can be used) for the duration of the COVID-19 declaration under Section 564(b)(1) of the Act, 21 U.S.C. section 360bbb-3(b)(1), unless the authorization is terminated or revoked.  Performed at [facility], [place] [address], [place], [place] [address]   Culture, blood (routine  x 2)     Status: None (Preliminary result)   Collection Time: 03/11/22 11:30 AM   Specimen: BLOOD RIGHT FOREARM  Result Value Ref Range Status   Specimen Description   Final    BLOOD RIGHT FOREARM Performed at [place] [address], [place], [place] [address]    Special Requests   Final    BOTTLES DRAWN AEROBIC AND ANAEROBIC Blood Culture results may not be optimal due to an excessive volume of blood received in culture bottles Performed at [place] [address] [place], [place] [address]    Culture   Final    NO GROWTH 4 DAYS Performed at [facility], [place] [address] [place], [place] [address]    Report Status PENDING  Incomplete  Culture, blood (routine x 2)     Status: None (Preliminary result)   Collection Time: 03/11/22 11:30 AM   Specimen: BLOOD RIGHT HAND  Result Value Ref Range Status   Specimen Description   Final    BLOOD RIGHT HAND Performed at [place] [address], [place], [place] [address]    Special Requests   Final    BOTTLES DRAWN AEROBIC AND ANAEROBIC Blood Culture results may not be optimal due to an inadequate volume of blood received in culture bottles Performed at [place] [address] [place], [place] [address]    Culture   Final    NO GROWTH 4 DAYS Performed at [facility], [place] [address], [place], [place] [address]    Report Status PENDING  Incomplete  SARS Coronavirus 2 by RT PCR (hospital order, performed in [facility] hospital lab) *cepheid single result test* Anterior Nasal Swab     Status: None   Collection Time: 03/11/22  3:35 PM   Specimen: Anterior Nasal Swab  Result Value Ref Range Status   SARS Coronavirus 2 by RT PCR NEGATIVE NEGATIVE Final    Comment: (NOTE) SARS-CoV-2 target nucleic acids are NOT DETECTED.  The  SARS-CoV-2 RNA is generally detectable in upper and lower respiratory specimens during the acute phase of  infection. The lowest concentration of SARS-CoV-2 viral copies this assay can detect is 250 copies / mL. A negative result does not preclude SARS-CoV-2 infection and should not be used as the sole basis for treatment or other patient management decisions.  A negative result may occur with improper specimen collection / handling, submission of specimen other than nasopharyngeal swab, presence of viral mutation(s) within the areas targeted by this assay, and inadequate number of viral copies (<250 copies / mL). A negative result must be combined with clinical observations, patient history, and epidemiological information.  Fact Sheet for Patients:   https://www.patel.info/  Fact Sheet for Healthcare Providers: https://hall.com/  This test is not yet approved or  cleared by the [place] FDA and has been authorized for detection and/or diagnosis of SARS-CoV-2 by FDA under an Emergency Use Authorization (EUA).  This EUA will remain in effect (meaning this test can be used) for the duration of the COVID-19 declaration under Section 564(b)(1) of the Act, 21 U.S.C. section 360bbb-3(b)(1), unless the authorization is terminated or revoked sooner.  Performed at [facility], [place] [address], [place], [place] [address]      Time coordinating discharge: Over 30 minutes  SIGNED:   Little Ishikawa, DO [facility] 03/15/2022, 11:44 AM Pager   If 7PM-7AM, please contact night-coverage [email or web address]

## 2022-03-16 DIAGNOSIS — G43809 Other migraine, not intractable, without status migrainosus: Secondary | ICD-10-CM | POA: Diagnosis not present

## 2022-03-16 DIAGNOSIS — J441 Chronic obstructive pulmonary disease with (acute) exacerbation: Secondary | ICD-10-CM | POA: Diagnosis not present

## 2022-03-16 DIAGNOSIS — J449 Chronic obstructive pulmonary disease, unspecified: Secondary | ICD-10-CM | POA: Diagnosis not present

## 2022-03-16 DIAGNOSIS — I480 Paroxysmal atrial fibrillation: Secondary | ICD-10-CM | POA: Diagnosis not present

## 2022-03-16 DIAGNOSIS — K2100 Gastro-esophageal reflux disease with esophagitis, without bleeding: Secondary | ICD-10-CM | POA: Diagnosis not present

## 2022-03-16 DIAGNOSIS — K277 Chronic peptic ulcer, site unspecified, without hemorrhage or perforation: Secondary | ICD-10-CM | POA: Diagnosis not present

## 2022-03-16 DIAGNOSIS — M150 Primary generalized (osteo)arthritis: Secondary | ICD-10-CM | POA: Diagnosis not present

## 2022-03-16 DIAGNOSIS — I10 Essential (primary) hypertension: Secondary | ICD-10-CM | POA: Diagnosis not present

## 2022-03-16 DIAGNOSIS — M6281 Muscle weakness (generalized): Secondary | ICD-10-CM | POA: Diagnosis not present

## 2022-03-16 DIAGNOSIS — E785 Hyperlipidemia, unspecified: Secondary | ICD-10-CM | POA: Diagnosis not present

## 2022-03-16 DIAGNOSIS — R531 Weakness: Secondary | ICD-10-CM | POA: Diagnosis not present

## 2022-03-16 LAB — LEGIONELLA PNEUMOPHILA SEROGP 1 UR AG: L. pneumophila Serogp 1 Ur Ag: NEGATIVE

## 2022-03-16 LAB — CULTURE, BLOOD (ROUTINE X 2)
Culture: NO GROWTH
Culture: NO GROWTH

## 2022-03-17 DIAGNOSIS — I10 Essential (primary) hypertension: Secondary | ICD-10-CM | POA: Diagnosis not present

## 2022-03-17 DIAGNOSIS — J441 Chronic obstructive pulmonary disease with (acute) exacerbation: Secondary | ICD-10-CM | POA: Diagnosis not present

## 2022-03-17 DIAGNOSIS — K2100 Gastro-esophageal reflux disease with esophagitis, without bleeding: Secondary | ICD-10-CM | POA: Diagnosis not present

## 2022-03-17 DIAGNOSIS — M6281 Muscle weakness (generalized): Secondary | ICD-10-CM | POA: Diagnosis not present

## 2022-03-17 DIAGNOSIS — I480 Paroxysmal atrial fibrillation: Secondary | ICD-10-CM | POA: Diagnosis not present

## 2022-03-19 DIAGNOSIS — R531 Weakness: Secondary | ICD-10-CM | POA: Diagnosis not present

## 2022-03-19 DIAGNOSIS — J449 Chronic obstructive pulmonary disease, unspecified: Secondary | ICD-10-CM | POA: Diagnosis not present

## 2022-03-19 DIAGNOSIS — I480 Paroxysmal atrial fibrillation: Secondary | ICD-10-CM | POA: Diagnosis not present

## 2022-03-19 DIAGNOSIS — K2100 Gastro-esophageal reflux disease with esophagitis, without bleeding: Secondary | ICD-10-CM | POA: Diagnosis not present

## 2022-03-19 DIAGNOSIS — I10 Essential (primary) hypertension: Secondary | ICD-10-CM | POA: Diagnosis not present

## 2022-03-19 DIAGNOSIS — M6281 Muscle weakness (generalized): Secondary | ICD-10-CM | POA: Diagnosis not present

## 2022-03-19 DIAGNOSIS — J441 Chronic obstructive pulmonary disease with (acute) exacerbation: Secondary | ICD-10-CM | POA: Diagnosis not present

## 2022-03-19 DIAGNOSIS — E785 Hyperlipidemia, unspecified: Secondary | ICD-10-CM | POA: Diagnosis not present

## 2022-03-23 DIAGNOSIS — I10 Essential (primary) hypertension: Secondary | ICD-10-CM | POA: Diagnosis not present

## 2022-03-23 DIAGNOSIS — J449 Chronic obstructive pulmonary disease, unspecified: Secondary | ICD-10-CM | POA: Diagnosis not present

## 2022-03-23 DIAGNOSIS — J441 Chronic obstructive pulmonary disease with (acute) exacerbation: Secondary | ICD-10-CM | POA: Diagnosis not present

## 2022-03-23 DIAGNOSIS — R600 Localized edema: Secondary | ICD-10-CM | POA: Diagnosis not present

## 2022-03-23 DIAGNOSIS — E785 Hyperlipidemia, unspecified: Secondary | ICD-10-CM | POA: Diagnosis not present

## 2022-03-23 DIAGNOSIS — K2100 Gastro-esophageal reflux disease with esophagitis, without bleeding: Secondary | ICD-10-CM | POA: Diagnosis not present

## 2022-03-23 DIAGNOSIS — I480 Paroxysmal atrial fibrillation: Secondary | ICD-10-CM | POA: Diagnosis not present

## 2022-03-23 DIAGNOSIS — R531 Weakness: Secondary | ICD-10-CM | POA: Diagnosis not present

## 2022-03-23 DIAGNOSIS — M6281 Muscle weakness (generalized): Secondary | ICD-10-CM | POA: Diagnosis not present

## 2022-03-24 DIAGNOSIS — J441 Chronic obstructive pulmonary disease with (acute) exacerbation: Secondary | ICD-10-CM | POA: Diagnosis not present

## 2022-03-24 DIAGNOSIS — R600 Localized edema: Secondary | ICD-10-CM | POA: Diagnosis not present

## 2022-03-24 DIAGNOSIS — I10 Essential (primary) hypertension: Secondary | ICD-10-CM | POA: Diagnosis not present

## 2022-03-24 DIAGNOSIS — M6281 Muscle weakness (generalized): Secondary | ICD-10-CM | POA: Diagnosis not present

## 2022-03-24 DIAGNOSIS — I480 Paroxysmal atrial fibrillation: Secondary | ICD-10-CM | POA: Diagnosis not present

## 2022-03-24 DIAGNOSIS — K2100 Gastro-esophageal reflux disease with esophagitis, without bleeding: Secondary | ICD-10-CM | POA: Diagnosis not present

## 2022-03-26 DIAGNOSIS — E785 Hyperlipidemia, unspecified: Secondary | ICD-10-CM | POA: Diagnosis not present

## 2022-03-26 DIAGNOSIS — K2100 Gastro-esophageal reflux disease with esophagitis, without bleeding: Secondary | ICD-10-CM | POA: Diagnosis not present

## 2022-03-26 DIAGNOSIS — J441 Chronic obstructive pulmonary disease with (acute) exacerbation: Secondary | ICD-10-CM | POA: Diagnosis not present

## 2022-03-26 DIAGNOSIS — M6281 Muscle weakness (generalized): Secondary | ICD-10-CM | POA: Diagnosis not present

## 2022-03-26 DIAGNOSIS — I10 Essential (primary) hypertension: Secondary | ICD-10-CM | POA: Diagnosis not present

## 2022-03-26 DIAGNOSIS — R600 Localized edema: Secondary | ICD-10-CM | POA: Diagnosis not present

## 2022-03-26 DIAGNOSIS — J449 Chronic obstructive pulmonary disease, unspecified: Secondary | ICD-10-CM | POA: Diagnosis not present

## 2022-03-26 DIAGNOSIS — I480 Paroxysmal atrial fibrillation: Secondary | ICD-10-CM | POA: Diagnosis not present

## 2022-03-30 ENCOUNTER — Other Ambulatory Visit (HOSPITAL_COMMUNITY): Payer: Self-pay

## 2022-03-30 DIAGNOSIS — L988 Other specified disorders of the skin and subcutaneous tissue: Secondary | ICD-10-CM | POA: Diagnosis not present

## 2022-03-30 DIAGNOSIS — M6281 Muscle weakness (generalized): Secondary | ICD-10-CM | POA: Diagnosis not present

## 2022-03-30 DIAGNOSIS — I480 Paroxysmal atrial fibrillation: Secondary | ICD-10-CM | POA: Diagnosis not present

## 2022-03-30 DIAGNOSIS — E785 Hyperlipidemia, unspecified: Secondary | ICD-10-CM | POA: Diagnosis not present

## 2022-03-30 DIAGNOSIS — J449 Chronic obstructive pulmonary disease, unspecified: Secondary | ICD-10-CM | POA: Diagnosis not present

## 2022-03-31 DIAGNOSIS — M6281 Muscle weakness (generalized): Secondary | ICD-10-CM | POA: Diagnosis not present

## 2022-03-31 DIAGNOSIS — K2100 Gastro-esophageal reflux disease with esophagitis, without bleeding: Secondary | ICD-10-CM | POA: Diagnosis not present

## 2022-03-31 DIAGNOSIS — I480 Paroxysmal atrial fibrillation: Secondary | ICD-10-CM | POA: Diagnosis not present

## 2022-03-31 DIAGNOSIS — I10 Essential (primary) hypertension: Secondary | ICD-10-CM | POA: Diagnosis not present

## 2022-03-31 DIAGNOSIS — J441 Chronic obstructive pulmonary disease with (acute) exacerbation: Secondary | ICD-10-CM | POA: Diagnosis not present

## 2022-03-31 DIAGNOSIS — R600 Localized edema: Secondary | ICD-10-CM | POA: Diagnosis not present

## 2022-04-01 ENCOUNTER — Other Ambulatory Visit (HOSPITAL_BASED_OUTPATIENT_CLINIC_OR_DEPARTMENT_OTHER): Payer: Self-pay

## 2022-04-02 DIAGNOSIS — I480 Paroxysmal atrial fibrillation: Secondary | ICD-10-CM | POA: Diagnosis not present

## 2022-04-02 DIAGNOSIS — J449 Chronic obstructive pulmonary disease, unspecified: Secondary | ICD-10-CM | POA: Diagnosis not present

## 2022-04-02 DIAGNOSIS — E785 Hyperlipidemia, unspecified: Secondary | ICD-10-CM | POA: Diagnosis not present

## 2022-04-02 DIAGNOSIS — M6281 Muscle weakness (generalized): Secondary | ICD-10-CM | POA: Diagnosis not present

## 2022-04-22 ENCOUNTER — Encounter (HOSPITAL_COMMUNITY): Payer: Self-pay | Admitting: Emergency Medicine

## 2022-04-22 ENCOUNTER — Other Ambulatory Visit: Payer: Self-pay

## 2022-04-22 ENCOUNTER — Emergency Department (HOSPITAL_COMMUNITY)
Admission: EM | Admit: 2022-04-22 | Discharge: 2022-04-22 | Disposition: A | Discharge status: Home / Self Care | Payer: Medicare PPO | Attending: Emergency Medicine | Admitting: Emergency Medicine

## 2022-04-22 ENCOUNTER — Emergency Department (HOSPITAL_COMMUNITY): Payer: Medicare PPO

## 2022-04-22 DIAGNOSIS — I10 Essential (primary) hypertension: Secondary | ICD-10-CM | POA: Diagnosis not present

## 2022-04-22 DIAGNOSIS — Z8673 Personal history of transient ischemic attack (TIA), and cerebral infarction without residual deficits: Secondary | ICD-10-CM | POA: Insufficient documentation

## 2022-04-22 DIAGNOSIS — R0602 Shortness of breath: Secondary | ICD-10-CM | POA: Insufficient documentation

## 2022-04-22 DIAGNOSIS — Z1152 Encounter for screening for COVID-19: Secondary | ICD-10-CM | POA: Insufficient documentation

## 2022-04-22 DIAGNOSIS — G4489 Other headache syndrome: Secondary | ICD-10-CM | POA: Diagnosis not present

## 2022-04-22 DIAGNOSIS — R000 Tachycardia, unspecified: Secondary | ICD-10-CM | POA: Diagnosis not present

## 2022-04-22 DIAGNOSIS — R519 Headache, unspecified: Secondary | ICD-10-CM | POA: Diagnosis not present

## 2022-04-22 LAB — RESP PANEL BY RT-PCR (RSV, FLU A&B, COVID)  RVPGX2
Influenza A by PCR: NEGATIVE
Influenza B by PCR: NEGATIVE
Resp Syncytial Virus by PCR: NEGATIVE
SARS Coronavirus 2 by RT PCR: NEGATIVE

## 2022-04-22 LAB — BASIC METABOLIC PANEL
Anion gap: 10 (ref 5–15)
BUN: 32 mg/dL — ABNORMAL HIGH (ref 8–23)
CO2: 26 mmol/L (ref 22–32)
Calcium: 9.2 mg/dL (ref 8.9–10.3)
Chloride: 104 mmol/L (ref 98–111)
Creatinine, Ser: 1.04 mg/dL (ref 0.61–1.24)
GFR, Estimated: >60 mL/min (ref >60)
Glucose, Bld: 106 mg/dL — ABNORMAL HIGH (ref 70–99)
Potassium: 4.2 mmol/L (ref 3.5–5.1)
Sodium: 140 mmol/L (ref 135–145)

## 2022-04-22 LAB — CBC
HCT: 42.5 % (ref 39.0–52.0)
Hemoglobin: 13.2 g/dL (ref 13.0–17.0)
MCH: 26.6 pg (ref 26.0–34.0)
MCHC: 31.1 g/dL (ref 30.0–36.0)
MCV: 85.7 fL (ref 80.0–100.0)
Platelets: 293 10*3/uL (ref 150–400)
RBC: 4.96 MIL/uL (ref 4.22–5.81)
RDW: 15.8 % — ABNORMAL HIGH (ref 11.5–15.5)
WBC: 10.7 10*3/uL — ABNORMAL HIGH (ref 4.0–10.5)
nRBC: 0 % (ref 0.0–0.2)

## 2022-04-22 MED ORDER — PANTOPRAZOLE SODIUM 40 MG PO TBEC
40.0000 mg | DELAYED_RELEASE_TABLET | Freq: Once | ORAL | Status: AC
  Administered 2022-04-22: 40 mg via ORAL
  Filled 2022-04-22: qty 1

## 2022-04-22 MED ORDER — PANTOPRAZOLE SODIUM 40 MG PO TBEC: 40.0000 mg | DELAYED_RELEASE_TABLET | Freq: Every day | ORAL | 0 refills | Status: DC

## 2022-04-22 NOTE — Discharge Instructions (Signed)
I will have you continue Protonix.  Please follow close with your primary care physician.  Return with any new or suddenly worsening symptoms.

## 2022-04-22 NOTE — ED Notes (Signed)
[facility] contacted for D/C transfer

## 2022-04-22 NOTE — ED Triage Notes (Signed)
Pt arrives via [facility] from [facility]. Pt initially called 911 himself without notifying staff stating that he was having an allergic reaction. Pt states "I'm allergic to a lot of stuff" but does not give known exposure. Pt told EMS that he called because of a headache. On arrival pt c/o SOB and headache. A+Ox4. NAD during triage.

## 2022-04-22 NOTE — ED Provider Triage Note (Signed)
Emergency Medicine Provider Triage Evaluation Note  Steven Simmons , a 83 y.o. male  was evaluated in triage.  Pt complains of headache x 6 weeks. Initially called EMS from [facility] for "allergic reaction". While on scene patient did not complain about an allergic reaction but instead a headache which has been going on for some time. Adamantly denied SOB but complaining of some here. Recently hospitalized for pneumonia and chronic afib. Rehab facility reports patient is not compliant with any of his meds.   Review of Systems  Positive: Headache, SOB Negative:   Physical Exam  There were no vitals taken for this visit. Gen:   Awake, no distress   Resp:  Normal effort, Speaking in full sentences MSK:   Moves extremities without difficulty  Other:    Medical Decision Making  Medically screening exam initiated at 2:32 PM.  Appropriate orders placed.  [name] was informed that the remainder of the evaluation will be completed by another provider, this initial triage assessment does not replace that evaluation, and the importance of remaining in the ED until their evaluation is complete.  Workup initiated   Kateri Plummer, PA-C 04/22/22 1432

## 2022-04-22 NOTE — ED Provider Notes (Signed)
Emergency Department Provider Note   I have reviewed the triage vital signs and the nursing notes.   HISTORY  Chief Complaint Shortness of Breath   HPI Steven Simmons is a 82 y.o. male past history reviewed below presents emergency department with concern for an allergic reaction.  He has multiple listed in our system as well as describing many intolerances to various food and medication additives.  He tells me the reason he came in today is that he was having some headache.  EMS arrived for transport and on arrival also describes some shortness of breath without chest pain.  No fevers or chills.  No rash, tongue/throat swelling.  No nausea, vomiting, diarrhea.  He suspects that some of his medications have changed manufacturer this may be causing symptoms.  He also suspects that there may be some food additives at his current rehab center.  Headache was gradual in onset.  No unilateral weakness/numbness.   Past Medical History:  Diagnosis Date   Allergy    Arthritis    shoulders (08/23/2017)   Aspiration pneumonia ([place]) 08/23/2017   Bleeding stomach ulcer 1988   Cataract    Complication of anesthesia 1993   " dont' know the name of it but they gave it to me at [facility] in 1993 "   I was cold, shaking and had a headache for hours afterwards "   GERD (gastroesophageal reflux disease)    Heart murmur    "when I was a child"   History of blood transfusion 1988   "bleeding ulcer"     History of TIA (transient ischemic attack) 07/2015   pt denies this hx on 08/23/2017   Hypertension    Migraine 1959-1980   "did have 4 ~ 2 wks ago; just the aura then" (08/23/2017)   Peptic ulcer disease    Sepsis secondary to UTI ([place]) 08/22/2017    Review of Systems  Constitutional: No fever/chills Cardiovascular: Denies chest pain. Respiratory: Denies shortness of breath. Gastrointestinal: No abdominal pain.  Musculoskeletal: Negative for back pain. Skin: Negative for rash. Neurological:  Negative for focal weakness or numbness. Positive HA.    ____________________________________________   PHYSICAL EXAM:  VITAL SIGNS: ED Triage Vitals  Enc Vitals Group     BP 04/22/22 1434 (!) 148/99     Pulse Rate 04/22/22 1434 61     Resp 04/22/22 1434 18     Temp 04/22/22 1434 98.9 F (37.2 C)     Temp Source 04/22/22 1434 Oral     SpO2 04/22/22 1434 95 %   Constitutional: Alert and oriented. Well appearing and in no acute distress. Eyes: Conjunctivae are normal.  Head: Atraumatic. Nose: No congestion/rhinnorhea. Mouth/Throat: Mucous membranes are moist.   Neck: No stridor.   Cardiovascular: Normal rate, regular rhythm. Good peripheral circulation. Grossly normal heart sounds.   Respiratory: Normal respiratory effort.  No retractions. Lungs CTAB. Gastrointestinal: Soft and nontender. No distention.  Musculoskeletal: No lower extremity tenderness nor edema. No gross deformities of extremities. Neurologic:  Normal speech and language. No gross focal neurologic deficits are appreciated.  Skin:  Skin is warm, dry and intact. No rash noted.  ____________________________________________   LABS (all labs ordered are listed, but only abnormal results are displayed)  Labs Reviewed  CBC - Abnormal; Notable for the following components:      Result Value   WBC 10.7 (*)    RDW 15.8 (*)    All other components within normal limits  BASIC METABOLIC PANEL -  Abnormal; Notable for the following components:   Glucose, Bld 106 (*)    BUN 32 (*)    All other components within normal limits  RESP PANEL BY RT-PCR (RSV, FLU A&B, COVID)  RVPGX2   ____________________________________________  EKG   EKG Interpretation  Date/Time:  Thursday April 22 2022 14:26:43 EST Ventricular Rate:  108 PR Interval:    QRS Duration: 87 QT Interval:  335 QTC Calculation: 449 R Axis:   21 Text Interpretation: Atrial fibrillation Confirmed by Nanda Quinton [id number]) on 04/22/2022 5:08:34 PM         ____________________________________________  RADIOLOGY  DG Chest 2 View  Result Date: 04/22/2022 CLINICAL DATA:  Provided history: Shortness of breath. Headache for 6 weeks. EXAM: CHEST - 2 VIEW COMPARISON:  Prior chest radiographs 03/11/2022 and earlier. FINDINGS: Heart size within normal limits. Aortic atherosclerosis. Small linear opacity within the left lung base compatible with atelectasis or scarring. No appreciable airspace consolidation. No evidence of pleural effusion or pneumothorax. No acute bony abnormality identified. IMPRESSION: No appreciable airspace consolidation. Small focus of linear atelectasis or scarring within the left lung base. Aortic Atherosclerosis (ICD10-I70.0). Electronically Signed   By: Kellie Simmering D.O.   On: 04/22/2022 15:45    ____________________________________________   PROCEDURES  Procedure(s) performed:   Procedures  None  ____________________________________________   INITIAL IMPRESSION / ASSESSMENT AND PLAN / ED COURSE  Pertinent labs & imaging results that were available during my care of the patient were reviewed by me and considered in my medical decision making (see chart for details).   This patient is Presenting for Evaluation of HA, which does require a range of treatment options, and is a complaint that involves a high risk of morbidity and mortality.  The Differential Diagnoses includes but is not exclusive to subarachnoid hemorrhage, meningitis, encephalitis, previous head trauma, cavernous venous thrombosis, muscle tension headache, glaucoma, temporal arteritis, migraine or migraine equivalent, etc.   I did obtain Additional Historical Information from EMS.   I decided to review pertinent External Data, and in summary d/c to SNF after 03/15/2022.   Clinical Laboratory Tests Ordered, included basic metabolic and CBC negative.  No anemia or acute kidney injury.  Radiologic Tests Ordered, included CXR. I independently  interpreted the images and agree with radiology interpretation.   Cardiac Monitor Tracing which shows NSR.    Social Determinants of Health Risk no active smoking.   Medical Decision Making: Summary:  Patient presents to the emergency department for evaluation of shortness of breath and concern for allergic reaction.  I do not appreciate evidence of anaphylaxis.  He describes headache which is gradual in onset.  No focal neurodeficits.  Plan for respiratory panel.  Chest x-ray without pneumonia.  No focal deficits to prompt neuroimaging such as CT head or other advanced neuroimaging.  Reevaluation with update and discussion with patient.  Feeling improved.  Will refill patient's Protonix and encourage close PCP follow-up.   Considered admission but workup here is reassuring.   Patient's presentation is most consistent with acute presentation with potential threat to life or bodily function.   Disposition: discharge  ____________________________________________  FINAL CLINICAL IMPRESSION(S) / ED DIAGNOSES  Final diagnoses:  Acute nonintractable headache, unspecified headache type  SOB (shortness of breath)     NEW OUTPATIENT MEDICATIONS STARTED DURING THIS VISIT:  New Prescriptions   PANTOPRAZOLE (PROTONIX) 40 MG TABLET    Take 1 tablet (40 mg total) by mouth daily.    Note:  This document was prepared using  [employer]agon [employer] forces training and education officer and may include unintentional dictation errors.  Nanda Quinton, MD, [facility] Emergency Medicine    Leyanna Bittman, [facility], MD 04/22/22 2005

## 2022-05-20 ENCOUNTER — Emergency Department (HOSPITAL_COMMUNITY): Payer: Medicare PPO

## 2022-05-20 ENCOUNTER — Emergency Department (HOSPITAL_COMMUNITY)
Admission: EM | Admit: 2022-05-20 | Discharge: 2022-05-20 | Disposition: A | Discharge status: Home / Self Care | Payer: Medicare PPO | Attending: Emergency Medicine | Admitting: Emergency Medicine

## 2022-05-20 ENCOUNTER — Other Ambulatory Visit: Payer: Self-pay

## 2022-05-20 DIAGNOSIS — R1013 Epigastric pain: Secondary | ICD-10-CM | POA: Diagnosis present

## 2022-05-20 DIAGNOSIS — K219 Gastro-esophageal reflux disease without esophagitis: Secondary | ICD-10-CM

## 2022-05-20 DIAGNOSIS — J210 Acute bronchiolitis due to respiratory syncytial virus: Secondary | ICD-10-CM

## 2022-05-20 DIAGNOSIS — B974 Respiratory syncytial virus as the cause of diseases classified elsewhere: Secondary | ICD-10-CM | POA: Insufficient documentation

## 2022-05-20 DIAGNOSIS — Z1152 Encounter for screening for COVID-19: Secondary | ICD-10-CM | POA: Insufficient documentation

## 2022-05-20 DIAGNOSIS — J189 Pneumonia, unspecified organism: Secondary | ICD-10-CM | POA: Diagnosis not present

## 2022-05-20 DIAGNOSIS — Z8673 Personal history of transient ischemic attack (TIA), and cerebral infarction without residual deficits: Secondary | ICD-10-CM | POA: Diagnosis not present

## 2022-05-20 LAB — COMPREHENSIVE METABOLIC PANEL
ALT: 16 U/L (ref 0–44)
AST: 21 U/L (ref 15–41)
Albumin: 3.9 g/dL (ref 3.5–5.0)
Alkaline Phosphatase: 95 U/L (ref 38–126)
Anion gap: 13 (ref 5–15)
BUN: 21 mg/dL (ref 8–23)
CO2: 27 mmol/L (ref 22–32)
Calcium: 9.2 mg/dL (ref 8.9–10.3)
Chloride: 96 mmol/L — ABNORMAL LOW (ref 98–111)
Creatinine, Ser: 0.9 mg/dL (ref 0.61–1.24)
GFR, Estimated: >60 mL/min (ref >60)
Glucose, Bld: 120 mg/dL — ABNORMAL HIGH (ref 70–99)
Potassium: 3.9 mmol/L (ref 3.5–5.1)
Sodium: 136 mmol/L (ref 135–145)
Total Bilirubin: 0.6 mg/dL (ref 0.3–1.2)
Total Protein: 7.7 g/dL (ref 6.5–8.1)

## 2022-05-20 LAB — URINALYSIS, ROUTINE W REFLEX MICROSCOPIC
Bilirubin Urine: NEGATIVE
Glucose, UA: NEGATIVE mg/dL
Hgb urine dipstick: NEGATIVE
Ketones, ur: NEGATIVE mg/dL
Leukocytes,Ua: NEGATIVE
Nitrite: NEGATIVE
Protein, ur: NEGATIVE mg/dL
Specific Gravity, Urine: 1.008 (ref 1.005–1.030)
pH: 7 (ref 5.0–8.0)

## 2022-05-20 LAB — CBC WITH DIFFERENTIAL/PLATELET
Abs Immature Granulocytes: 0.05 10*3/uL (ref 0.00–0.07)
Basophils Absolute: 0 10*3/uL (ref 0.0–0.1)
Basophils Relative: 1 %
Eosinophils Absolute: 0.1 10*3/uL (ref 0.0–0.5)
Eosinophils Relative: 1 %
HCT: 42.1 % (ref 39.0–52.0)
Hemoglobin: 13.3 g/dL (ref 13.0–17.0)
Immature Granulocytes: 1 %
Lymphocytes Relative: 12 %
Lymphs Abs: 0.7 10*3/uL (ref 0.7–4.0)
MCH: 27.5 pg (ref 26.0–34.0)
MCHC: 31.6 g/dL (ref 30.0–36.0)
MCV: 87 fL (ref 80.0–100.0)
Monocytes Absolute: 0.7 10*3/uL (ref 0.1–1.0)
Monocytes Relative: 12 %
Neutro Abs: 4.3 10*3/uL (ref 1.7–7.7)
Neutrophils Relative %: 73 %
Platelets: 300 10*3/uL (ref 150–400)
RBC: 4.84 MIL/uL (ref 4.22–5.81)
RDW: 16.7 % — ABNORMAL HIGH (ref 11.5–15.5)
WBC: 5.8 10*3/uL (ref 4.0–10.5)
nRBC: 0 % (ref 0.0–0.2)

## 2022-05-20 LAB — TROPONIN I (HIGH SENSITIVITY)
Troponin I (High Sensitivity): 15 ng/L (ref <18)
Troponin I (High Sensitivity): 14 ng/L (ref <18)

## 2022-05-20 LAB — RESP PANEL BY RT-PCR (RSV, FLU A&B, COVID)  RVPGX2
Influenza A by PCR: NEGATIVE
Influenza B by PCR: NEGATIVE
Resp Syncytial Virus by PCR: POSITIVE — AB
SARS Coronavirus 2 by RT PCR: NEGATIVE

## 2022-05-20 LAB — LIPASE, BLOOD: Lipase: 36 U/L (ref 11–51)

## 2022-05-20 MED ORDER — PANTOPRAZOLE SODIUM 40 MG PO TBEC: 40.0000 mg | DELAYED_RELEASE_TABLET | Freq: Every day | ORAL | 0 refills | Status: AC

## 2022-05-20 MED ORDER — LEVOFLOXACIN IN D5W 750 MG/150ML IV SOLN
750.0000 mg | Freq: Once | INTRAVENOUS | Status: AC
  Administered 2022-05-20: 750 mg via INTRAVENOUS
  Filled 2022-05-20: qty 150

## 2022-05-20 MED ORDER — LEVOFLOXACIN 750 MG PO TABS: 750.0000 mg | ORAL_TABLET | Freq: Every day | ORAL | 0 refills | Status: DC

## 2022-05-20 MED ORDER — ALUM & MAG HYDROXIDE-SIMETH 200-200-20 MG/5ML PO SUSP
30.0000 mL | Freq: Once | ORAL | Status: AC
  Administered 2022-05-20: 30 mL via ORAL
  Filled 2022-05-20: qty 30

## 2022-05-20 MED ORDER — LIDOCAINE VISCOUS HCL 2 % MT SOLN
15.0000 mL | Freq: Once | OROMUCOSAL | Status: AC
  Administered 2022-05-20: 15 mL via ORAL
  Filled 2022-05-20: qty 15

## 2022-05-20 MED ORDER — PANTOPRAZOLE SODIUM 40 MG PO TBEC
40.0000 mg | DELAYED_RELEASE_TABLET | Freq: Every day | ORAL | Status: DC
  Administered 2022-05-20: 40 mg via ORAL
  Filled 2022-05-20: qty 1

## 2022-05-20 NOTE — Discharge Instructions (Addendum)
Take the Levaquin once daily for the next 7 days.  Take Protonix by mouth once in the morning 30 minutes before your first meal.  Repeat chest x-ray to ensure resolution of the pneumonia in 2 weeks, return to the ED for coughing up blood, significant shortness of breath, chest pain, new or concerning symptoms.

## 2022-05-20 NOTE — ED Notes (Signed)
Pt leaving facility with [facility] to return to [facility] . Report was given by previous RN

## 2022-05-20 NOTE — ED Triage Notes (Signed)
BIBA from [facility] for epigastric pain described as burning sensation and headache x4 days Denies n/v/d Hx gerd

## 2022-05-20 NOTE — ED Provider Notes (Signed)
[facility] Provider Note   CSN: [id number] Arrival date & time: 05/20/22  1210     History  Chief Complaint  Patient presents with  . Abdominal Pain    Steven Simmons is a 82 y.o. male.   Abdominal Pain    This is a 82 year old male with history of murmur, TIA, peptic ulcers, A-fib not on anticoagulation secondary to GI bleed history presenting to the emergency department due to abdominal pain.  Is been going on for about a week, states it feels like gas/GERD.  He is requesting multiple antacids that is at home but they have not provided it, states he came to the ED because he pain has been persistent.  He is not having any chest pain, does not radiate to the back, he feels chronically short of breath and does endorse a productive cough over the last few days.  No dark and tarry stool, hematemesis, syncope.  History of abdominal surgeries of laparoscopy cholecystectomy, excisional hemorrhoidectomy  Home Medications Prior to Admission medications   Medication Sig Start Date End Date Taking? Authorizing Provider  levofloxacin (LEVAQUIN) 750 MG tablet Take 1 tablet (750 mg total) by mouth daily. 05/20/22  Yes Sherrill Raring, PA-C  acetaminophen (TYLENOL) 500 MG tablet Take 1,000 mg by mouth daily as needed for mild pain.    [provider]  diltiazem (CARDIZEM CD) 120 MG 24 hr capsule Take 1 capsule (120 mg total) by mouth daily. 03/03/22 04/02/22  Regan Lemming, MD  methylPREDNISolone (MEDROL DOSEPAK) 4 MG TBPK tablet Taper as directed 03/15/22   Little Ishikawa, MD  pantoprazole (PROTONIX) 40 MG tablet Take 1 tablet (40 mg total) by mouth daily. 05/20/22 06/19/22  Sherrill Raring, PA-C  potassium chloride SA (KLOR-CON M) 20 MEQ tablet Take 1 tablet (20 mEq total) by mouth daily for 7 days. Patient not taking: Reported on 03/03/2022 01/18/22 01/25/22  Wells Guiles, DO      Allergies    Ceclor [cefaclor]; Veralipride;  Penicillins; Amlodipine; Antihistamines, chlorpheniramine-type; Beta adrenergic blockers; Celebrex [celecoxib]; Ibuprofen; Lipitor [atorvastatin]; and Nutrasweet aspartame [aspartame]    Review of Systems   Review of Systems  Gastrointestinal:  Positive for abdominal pain.    Physical Exam Updated Vital Signs BP (!) 177/107 (BP Location: Right Arm)   Pulse 67   Temp 98.9 F (37.2 C) (Oral)   Resp 18   Wt 104 kg   SpO2 100%   BMI 32.90 kg/m  Physical Exam Vitals and nursing note reviewed. Exam conducted with a chaperone present.  Constitutional:      Appearance: Normal appearance.  HENT:     Head: Normocephalic and atraumatic.  Eyes:     General: No scleral icterus.       Right eye: No discharge.        Left eye: No discharge.     Extraocular Movements: Extraocular movements intact.     Pupils: Pupils are equal, round, and reactive to light.  Cardiovascular:     Rate and Rhythm: Normal rate. Rhythm irregular.     Pulses: Normal pulses.     Heart sounds: Murmur heard.     No friction rub. No gallop.     Comments: Upper and lower extremity pulses are symmetric bilaterally, irregular rhythm but rate controlled Pulmonary:     Effort: Pulmonary effort is normal. No respiratory distress.     Breath sounds: Normal breath sounds.  Abdominal:     General: Abdomen is flat. A  surgical scar is present. Bowel sounds are normal. There is no distension.     Palpations: Abdomen is soft.     Tenderness: There is abdominal tenderness in the epigastric area.  Skin:    General: Skin is warm and dry.     Coloration: Skin is not jaundiced.  Neurological:     Mental Status: He is alert. Mental status is at baseline.     Coordination: Coordination normal.     ED Results / Procedures / Treatments   Labs (all labs ordered are listed, but only abnormal results are displayed) Labs Reviewed  RESP PANEL BY RT-PCR (RSV, FLU A&B, COVID)  RVPGX2 - Abnormal; Notable for the following components:       Result Value   Resp Syncytial Virus by PCR POSITIVE (*)    All other components within normal limits  CBC WITH DIFFERENTIAL/PLATELET - Abnormal; Notable for the following components:   RDW 16.7 (*)    All other components within normal limits  COMPREHENSIVE METABOLIC PANEL - Abnormal; Notable for the following components:   Chloride 96 (*)    Glucose, Bld 120 (*)    All other components within normal limits  URINALYSIS, ROUTINE W REFLEX MICROSCOPIC - Abnormal; Notable for the following components:   Color, Urine STRAW (*)    All other components within normal limits  LIPASE, BLOOD  TROPONIN I (HIGH SENSITIVITY)  TROPONIN I (HIGH SENSITIVITY)    EKG EKG Interpretation  Date/Time:  Thursday May 20 2022 12:28:22 EST Ventricular Rate:  93 PR Interval:    QRS Duration: 95 QT Interval:  356 QTC Calculation: 443 R Axis:   38 Text Interpretation: Atrial fibrillation Confirmed by Tretha Sciara [phone]) on 05/20/2022 1:29:33 PM  Radiology DG Chest Portable 1 View  Result Date: 05/20/2022 CLINICAL DATA:  Cough EXAM: PORTABLE CHEST 1 VIEW COMPARISON:  Chest x-ray dated April 14, 2022 FINDINGS: The heart size and mediastinal contours are within normal limits. New mild retrocardiac opacity. The visualized skeletal structures are unremarkable. IMPRESSION: New mild retrocardiac opacity, which may represent atelectasis or infection/aspiration. Recommend follow-up PA and lateral chest x-ray in 6-8 weeks to ensure resolution. Electronically Signed   By: Yetta Glassman M.D.   On: 05/20/2022 13:43    Procedures Procedures    Medications Ordered in ED Medications  pantoprazole (PROTONIX) EC tablet 40 mg (40 mg Oral Given 05/20/22 1414)  levofloxacin (LEVAQUIN) IVPB 750 mg (750 mg Intravenous New Bag/Given 05/20/22 1438)  alum & mag hydroxide-simeth (MAALOX/MYLANTA) 200-200-20 MG/5ML suspension 30 mL (30 mLs Oral Given 05/20/22 1342)    And  lidocaine (XYLOCAINE) 2 % viscous mouth  solution 15 mL (15 mLs Oral Given 05/20/22 1342)    ED Course/ Medical Decision Making/ A&P Clinical Course as of 05/20/22 1536  Thu May 20, 2022  1329 Evaluated personally at bedside.  82 year old male with multiple complaints today.  Initially stated he was having flare of his gastroesophageal reflux disease now he is stating he is having cough and shortness of breath and that his neighbor and the nursing facility has pneumonia.  Finally he stated he does not like the nursing home he is at. He does feel that it safe and is willing to return but with like to discuss changing nursing facility.  Informed patient on methods for pain outpatient social work follow-up for facility change, lab work performed grossly reassuring.  Symptoms will be treated with his normal gastroesophageal reflux medications and will evaluate for pneumonia with x-ray.  If reassuring,  patient stable for outpatient care and follow-up plan to return back to his facility. [CC]  [id number] ED EKG AF, no RVR.  Patient is in atrial fibrillation on cardiac monitoring, interpretation. [HS]  1430 DG Chest Portable 1 View Signs of pneumonia  [HS]  1431 CBC with Differential(!) No leukocytosis or anemia [HS]  1431 Comprehensive metabolic panel(!) No gross electrolyte derangement, AKI or transaminitis [HS]  1431 Urinalysis, Routine w reflex microscopic -Urine, Clean Catch(!) No evidence of underlying UTI [HS]  1431 Troponin I (High Sensitivity) [HS]  1432 Reeval the patient multiple times in the room, patient is comfortable, tolerating p.o.  Repeat abdominal exam is benign [HS]  1454 Resp panel by RT-PCR (RSV, Flu A&B, Covid) Urine, Clean Catch(!) RSV + [HS]  1510 Lipase, blood Wnl, doubt pancreatitis  [HS]  1536 Troponin I (High Sensitivity) Negative delta troponin [HS]    Clinical Course User Index [CC] Tretha Sciara, MD [HS] Sherrill Raring, [place]                             Medical Decision Making Amount and/or Complexity of  Data Reviewed Independent Historian: EMS    Details: In hpi External Data Reviewed: labs, radiology, ECG and notes.    Details: Prior ed visits, ct scans, and labs - h/o of AF, not anticoagulated  Labs: ordered. Decision-making details documented in ED Course. Radiology: ordered. Decision-making details documented in ED Course. ECG/medicine tests: ordered. Decision-making details documented in ED Course.  Risk OTC drugs. Prescription drug management. Decision regarding hospitalization.   This is an 82 year old male presenting to the emergency department due to epigastric abdominal pain.  Differential includes atypical ACS, pancreatitis, GERD, gastritis, perforated ulcer, diverticulitis, colitis, dissection, AAA, PNA  Abdominal exam is nonperitoneal, very mild epigastric tenderness without rebound or guarding.  Upper and lower extremity pulses are symmetric, he is not having any radiating pain between shoulder blades and does not sound like AAA or dissection.  I considered atypical ACS but no chest pain, no ischemic change on EKG and nondetectable troponin.  Consistent with GERD and EKG is without ischemic changes.  Will also check labs including a troponin.  Will hold off on CT at this time but will add a chest x-ray to evaluate for possible underlying pneumonia.  He is not having any melena, not anticoagulated and presentation is not consistent with mesenteric ischemia or GI bleed.  Patient has evidence of underlying pneumonia on chest x-ray, considered hospitalization but he is well-appearing, not hypoxic, his curb score is 1 making relatively low risk.  He is also in skilled nursing facility so I think is reasonable to trial outpatient antibiotics.  Patient comfortable with plan, we discussed strict return precautions, will send him home with refill of the pantoprazole as well as Levaquin, stable for discharge at this time.        Final Clinical Impression(s) / ED Diagnoses Final  diagnoses:  Gastroesophageal reflux disease, unspecified whether esophagitis present  Community acquired pneumonia, unspecified laterality  RSV (acute bronchiolitis due to respiratory syncytial virus)    Rx / DC Orders ED Discharge Orders          Ordered    levofloxacin (LEVAQUIN) 750 MG tablet  Daily        05/20/22 1434    pantoprazole (PROTONIX) 40 MG tablet  Daily        05/20/22 1434  Sherrill Raring, PA-C 05/20/22 [place], MD 05/21/22 [phone]

## 2022-05-29 ENCOUNTER — Other Ambulatory Visit: Payer: Self-pay

## 2022-05-29 ENCOUNTER — Emergency Department (HOSPITAL_COMMUNITY): Payer: Medicare PPO

## 2022-05-29 ENCOUNTER — Emergency Department (HOSPITAL_COMMUNITY)
Admission: EM | Admit: 2022-05-29 | Discharge: 2022-05-29 | Disposition: A | Discharge status: Skilled Nursing Facility | Payer: Medicare PPO | Attending: Emergency Medicine | Admitting: Emergency Medicine

## 2022-05-29 ENCOUNTER — Encounter (HOSPITAL_COMMUNITY): Payer: Self-pay

## 2022-05-29 DIAGNOSIS — K2900 Acute gastritis without bleeding: Secondary | ICD-10-CM | POA: Insufficient documentation

## 2022-05-29 DIAGNOSIS — R1013 Epigastric pain: Secondary | ICD-10-CM | POA: Diagnosis present

## 2022-05-29 DIAGNOSIS — I10 Essential (primary) hypertension: Secondary | ICD-10-CM | POA: Insufficient documentation

## 2022-05-29 DIAGNOSIS — Z79899 Other long term (current) drug therapy: Secondary | ICD-10-CM | POA: Diagnosis not present

## 2022-05-29 LAB — CBC WITH DIFFERENTIAL/PLATELET
Abs Immature Granulocytes: 0.09 10*3/uL — ABNORMAL HIGH (ref 0.00–0.07)
Basophils Absolute: 0 10*3/uL (ref 0.0–0.1)
Basophils Relative: 1 %
Eosinophils Absolute: 0.1 10*3/uL (ref 0.0–0.5)
Eosinophils Relative: 1 %
HCT: 41.6 % (ref 39.0–52.0)
Hemoglobin: 13.3 g/dL (ref 13.0–17.0)
Immature Granulocytes: 1 %
Lymphocytes Relative: 15 %
Lymphs Abs: 1.3 10*3/uL (ref 0.7–4.0)
MCH: 27.5 pg (ref 26.0–34.0)
MCHC: 32 g/dL (ref 30.0–36.0)
MCV: 86.1 fL (ref 80.0–100.0)
Monocytes Absolute: 0.6 10*3/uL (ref 0.1–1.0)
Monocytes Relative: 7 %
Neutro Abs: 6.6 10*3/uL (ref 1.7–7.7)
Neutrophils Relative %: 75 %
Platelets: 331 10*3/uL (ref 150–400)
RBC: 4.83 MIL/uL (ref 4.22–5.81)
RDW: 15.9 % — ABNORMAL HIGH (ref 11.5–15.5)
WBC: 8.8 10*3/uL (ref 4.0–10.5)
nRBC: 0 % (ref 0.0–0.2)

## 2022-05-29 LAB — COMPREHENSIVE METABOLIC PANEL
ALT: 17 U/L (ref 0–44)
AST: 37 U/L (ref 15–41)
Albumin: 3.7 g/dL (ref 3.5–5.0)
Alkaline Phosphatase: 91 U/L (ref 38–126)
Anion gap: 7 (ref 5–15)
BUN: 22 mg/dL (ref 8–23)
CO2: 27 mmol/L (ref 22–32)
Calcium: 9 mg/dL (ref 8.9–10.3)
Chloride: 96 mmol/L — ABNORMAL LOW (ref 98–111)
Creatinine, Ser: 0.75 mg/dL (ref 0.61–1.24)
GFR, Estimated: >60 mL/min (ref >60)
Glucose, Bld: 117 mg/dL — ABNORMAL HIGH (ref 70–99)
Potassium: 5.1 mmol/L (ref 3.5–5.1)
Sodium: 130 mmol/L — ABNORMAL LOW (ref 135–145)
Total Bilirubin: 1.4 mg/dL — ABNORMAL HIGH (ref 0.3–1.2)
Total Protein: 7.4 g/dL (ref 6.5–8.1)

## 2022-05-29 LAB — TROPONIN I (HIGH SENSITIVITY): Troponin I (High Sensitivity): 12 ng/L (ref <18)

## 2022-05-29 LAB — LIPASE, BLOOD: Lipase: 39 U/L (ref 11–51)

## 2022-05-29 MED ORDER — ALUM & MAG HYDROXIDE-SIMETH 200-200-20 MG/5ML PO SUSP
30.0000 mL | Freq: Once | ORAL | Status: AC
  Administered 2022-05-29: 30 mL via ORAL
  Filled 2022-05-29: qty 30

## 2022-05-29 MED ORDER — FAMOTIDINE 20 MG PO TABS
20.0000 mg | ORAL_TABLET | Freq: Once | ORAL | Status: AC
  Administered 2022-05-29: 20 mg via ORAL
  Filled 2022-05-29: qty 1

## 2022-05-29 NOTE — ED Notes (Signed)
Report given to Joellen Jersey, nurse accepting patient at [facility] and rehab.

## 2022-05-29 NOTE — Discharge Instructions (Signed)
You were seen in the emergency department for your abdominal pain.  Your workup showed no signs of bleeding, liver or pancreas abnormality or abnormalities with your heart.  Your symptoms are likely due to your acid reflux and you should continue to take your antacid medicines and you can take Maalox as needed for additional pain.  You should call your GI doctor on Monday to schedule a follow-up appointment as you may need another endoscopy.  You should return to the emergency department for significantly worsening pain, black or bloody looking stools, if you vomit blood or if you have any other new or concerning symptoms.

## 2022-05-29 NOTE — ED Provider Notes (Signed)
[facility] Provider Note   CSN: [id number] Arrival date & time: 05/29/22  1547     History  Chief Complaint  Patient presents with   Abdominal Pain    Trevell Stjean is a 82 y.o. male.  Patient is an 82 year old male with a past medical history of PUD, hypertension, A-fib not on anticoagulation presenting to the emergency department with epigastric pain.  Patient states that he woke up this morning and had some eggs for breakfast and then went back to bed.  He states that he woke up about an hour later with severe sharp epigastric pain.  He denies any associated nausea, vomiting, diarrhea or constipation, black or bloody stools.  He states that he believes he has been taking his antacids as prescribed.  He states that he has not seen a GI doctor in several years.  Patient reports history of cholecystectomy.  The history is provided by the patient.  Abdominal Pain      Home Medications Prior to Admission medications   Medication Sig Start Date End Date Taking? Authorizing Provider  acetaminophen (TYLENOL) 500 MG tablet Take 1,000 mg by mouth daily as needed for mild pain.    [provider]  diltiazem (CARDIZEM CD) 120 MG 24 hr capsule Take 1 capsule (120 mg total) by mouth daily. 03/03/22 04/02/22  Regan Lemming, MD  levofloxacin (LEVAQUIN) 750 MG tablet Take 1 tablet (750 mg total) by mouth daily. 05/20/22   Sherrill Raring, PA-C  methylPREDNISolone (MEDROL DOSEPAK) 4 MG TBPK tablet Taper as directed 03/15/22   Little Ishikawa, MD  pantoprazole (PROTONIX) 40 MG tablet Take 1 tablet (40 mg total) by mouth daily. 05/20/22 06/19/22  Sherrill Raring, PA-C  potassium chloride SA (KLOR-CON M) 20 MEQ tablet Take 1 tablet (20 mEq total) by mouth daily for 7 days. Patient not taking: Reported on 03/03/2022 01/18/22 01/25/22  Wells Guiles, DO      Allergies    Ceclor [cefaclor]; Veralipride; Penicillins; Amlodipine; Antihistamines,  chlorpheniramine-type; Beta adrenergic blockers; Celebrex [celecoxib]; Ibuprofen; Lipitor [atorvastatin]; and Nutrasweet aspartame [aspartame]    Review of Systems   Review of Systems  Gastrointestinal:  Positive for abdominal pain.    Physical Exam Updated Vital Signs BP (!) 174/98   Pulse 63   Temp 98.6 F (37 C) (Oral)   Resp (!) 21   Ht '5\' 10"'$  (1.778 m)   Wt 104 kg   SpO2 96%   BMI 32.90 kg/m  Physical Exam Vitals and nursing note reviewed.  Constitutional:      General: He is not in acute distress.    Appearance: He is well-developed.  HENT:     Head: Normocephalic and atraumatic.     Mouth/Throat:     Mouth: Mucous membranes are moist.     Pharynx: Oropharynx is clear.  Eyes:     Extraocular Movements: Extraocular movements intact.  Cardiovascular:     Rate and Rhythm: Normal rate and regular rhythm.     Heart sounds: Normal heart sounds.  Pulmonary:     Effort: Pulmonary effort is normal.     Breath sounds: Normal breath sounds.  Abdominal:     General: Abdomen is flat.     Palpations: Abdomen is soft.     Tenderness: There is no abdominal tenderness.  Skin:    General: Skin is warm and dry.  Neurological:     General: No focal deficit present.     Mental Status: He is  alert and oriented to person, place, and time.  Psychiatric:        Mood and Affect: Mood normal.        Behavior: Behavior normal.     ED Results / Procedures / Treatments   Labs (all labs ordered are listed, but only abnormal results are displayed) Labs Reviewed  COMPREHENSIVE METABOLIC PANEL - Abnormal; Notable for the following components:      Result Value   Sodium 130 (*)    Chloride 96 (*)    Glucose, Bld 117 (*)    Total Bilirubin 1.4 (*)    All other components within normal limits  CBC WITH DIFFERENTIAL/PLATELET - Abnormal; Notable for the following components:   RDW 15.9 (*)    Abs Immature Granulocytes 0.09 (*)    All other components within normal limits  LIPASE,  BLOOD  TROPONIN I (HIGH SENSITIVITY)    EKG EKG Interpretation  Date/Time:  Saturday May 29 2022 16:51:59 EST Ventricular Rate:  63 PR Interval:    QRS Duration: 94 QT Interval:  423 QTC Calculation: 433 R Axis:   27 Text Interpretation: Atrial flutter No significant change Since last tracing of earlier today Confirmed by Leanord Asal ([id number]) on 05/29/2022 4:58:18 PM  Radiology DG Chest 2 View  Result Date: 05/29/2022 CLINICAL DATA:  Epigastric/chest pain. EXAM: CHEST - 2 VIEW COMPARISON:  05/20/2022 FINDINGS: Improving left basilar retrocardiac opacity. No new airspace disease. Stable heart size and mediastinal contours. No pulmonary edema. No pleural fluid or pneumothorax. Stable osseous structures. IMPRESSION: Improving left basilar retrocardiac opacity. No acute findings. Electronically Signed   By: Keith Rake M.D.   On: 05/29/2022 17:12    Procedures Procedures    Medications Ordered in ED Medications  famotidine (PEPCID) tablet 20 mg (20 mg Oral Given 05/29/22 1735)  alum & mag hydroxide-simeth (MAALOX/MYLANTA) 200-200-20 MG/5ML suspension 30 mL (30 mLs Oral Given 05/29/22 1735)    ED Course/ Medical Decision Making/ A&P                             Medical Decision Making This patient presents to the ED with chief complaint(s) of epigastric pain with pertinent past medical history of PUD, HTN, a fib which further complicates the presenting complaint. The complaint involves an extensive differential diagnosis and also carries with it a high risk of complications and morbidity.    The differential diagnosis includes gastritis, GERD, PUD, anemia, ACS, arrhythmia, pancreatitis, hepatitis, pulmonary edema, pleural effusion, pneumonia, pneumothorax  Additional history obtained: Additional history obtained from N/A Records reviewed Primary Care Documents, Nursing Home Documents, and Prior GI records  ED Course and Reassessment: On patient's arrival to emergency  department he is awake alert well-appearing in no acute distress.  He has no point abdominal tenderness on exam.  He will have EKG and labs including troponin to evaluate for atypical ACS as well as CBC to evaluate for anemia and possible bleeding gastritis or PUD.  Will be given GI cocktail and will be closely reassessed.  Independent labs interpretation:  The following labs were independently interpreted: within normal range  Independent visualization of imaging: - I independently visualized the following imaging with scope of interpretation limited to determining acute life threatening conditions related to emergency care: CXR, which revealed no acute disease  Consultation: - Consulted or discussed management/test interpretation w/ external professional: N/A  Consideration for admission or further workup: Patient has no emergent conditions requiring admission or  further work-up at this time and is stable for discharge home with GI follow-up  Social Determinants of health: N/A    Amount and/or Complexity of Data Reviewed Labs: ordered. Radiology: ordered.  Risk OTC drugs.          Final Clinical Impression(s) / ED Diagnoses Final diagnoses:  Acute gastritis without hemorrhage, unspecified gastritis type    Rx / DC Orders ED Discharge Orders     None         Kemper Durie, DO 05/29/22 1944

## 2022-05-29 NOTE — ED Notes (Signed)
Patient given discharge instructions and follow up care. Patient verbalized understanding. IV removed with catheter intact. [facility] here to take patient back to facility, patient taken out of ED via stretcher.

## 2022-05-29 NOTE — ED Triage Notes (Addendum)
States that he is having upper abdominal pain that worsens after eating, also states it hurts worst when he lays down. No blood in the stool. States he is nauseous, Patient as just here on 05/20/2022 with the same complaint.   HX of GER  152/76 60 94 %

## 2022-05-29 NOTE — ED Notes (Signed)
[facility] called for transport at this time 

## 2022-05-29 NOTE — ED Notes (Signed)
Attempted to call and give report to nurse accepting patient back at rehab, no answer.

## 2022-06-09 ENCOUNTER — Other Ambulatory Visit: Payer: Self-pay

## 2022-06-09 ENCOUNTER — Inpatient Hospital Stay (HOSPITAL_COMMUNITY)
Admission: EM | Admit: 2022-06-09 | Discharge: 2022-06-14 | DRG: 690 | Disposition: A | Discharge status: Skilled Nursing Facility | Payer: Medicare PPO | Source: Skilled Nursing Facility | Attending: Internal Medicine | Admitting: Internal Medicine

## 2022-06-09 ENCOUNTER — Emergency Department (HOSPITAL_COMMUNITY): Payer: Medicare PPO

## 2022-06-09 DIAGNOSIS — E8809 Other disorders of plasma-protein metabolism, not elsewhere classified: Secondary | ICD-10-CM | POA: Diagnosis present

## 2022-06-09 DIAGNOSIS — B9620 Unspecified Escherichia coli [E. coli] as the cause of diseases classified elsewhere: Secondary | ICD-10-CM | POA: Diagnosis present

## 2022-06-09 DIAGNOSIS — Z800 Family history of malignant neoplasm of digestive organs: Secondary | ICD-10-CM

## 2022-06-09 DIAGNOSIS — G43909 Migraine, unspecified, not intractable, without status migrainosus: Secondary | ICD-10-CM | POA: Diagnosis not present

## 2022-06-09 DIAGNOSIS — I4891 Unspecified atrial fibrillation: Secondary | ICD-10-CM

## 2022-06-09 DIAGNOSIS — Z8701 Personal history of pneumonia (recurrent): Secondary | ICD-10-CM

## 2022-06-09 DIAGNOSIS — Z8249 Family history of ischemic heart disease and other diseases of the circulatory system: Secondary | ICD-10-CM

## 2022-06-09 DIAGNOSIS — Z1152 Encounter for screening for COVID-19: Secondary | ICD-10-CM

## 2022-06-09 DIAGNOSIS — K219 Gastro-esophageal reflux disease without esophagitis: Secondary | ICD-10-CM | POA: Diagnosis present

## 2022-06-09 DIAGNOSIS — E669 Obesity, unspecified: Secondary | ICD-10-CM | POA: Diagnosis present

## 2022-06-09 DIAGNOSIS — E876 Hypokalemia: Secondary | ICD-10-CM | POA: Diagnosis present

## 2022-06-09 DIAGNOSIS — Z66 Do not resuscitate: Secondary | ICD-10-CM | POA: Diagnosis not present

## 2022-06-09 DIAGNOSIS — I10 Essential (primary) hypertension: Secondary | ICD-10-CM | POA: Diagnosis present

## 2022-06-09 DIAGNOSIS — E441 Mild protein-calorie malnutrition: Secondary | ICD-10-CM | POA: Diagnosis present

## 2022-06-09 DIAGNOSIS — Z6830 Body mass index (BMI) 30.0-30.9, adult: Secondary | ICD-10-CM

## 2022-06-09 DIAGNOSIS — I2510 Atherosclerotic heart disease of native coronary artery without angina pectoris: Secondary | ICD-10-CM | POA: Diagnosis present

## 2022-06-09 DIAGNOSIS — Z1623 Resistance to quinolones and fluoroquinolones: Secondary | ICD-10-CM | POA: Diagnosis present

## 2022-06-09 DIAGNOSIS — E871 Hypo-osmolality and hyponatremia: Secondary | ICD-10-CM | POA: Diagnosis present

## 2022-06-09 DIAGNOSIS — G9341 Metabolic encephalopathy: Secondary | ICD-10-CM

## 2022-06-09 DIAGNOSIS — R7303 Prediabetes: Secondary | ICD-10-CM | POA: Diagnosis present

## 2022-06-09 DIAGNOSIS — Z886 Allergy status to analgesic agent status: Secondary | ICD-10-CM

## 2022-06-09 DIAGNOSIS — Z880 Allergy status to penicillin: Secondary | ICD-10-CM

## 2022-06-09 DIAGNOSIS — J9811 Atelectasis: Secondary | ICD-10-CM | POA: Diagnosis not present

## 2022-06-09 DIAGNOSIS — Z87891 Personal history of nicotine dependence: Secondary | ICD-10-CM

## 2022-06-09 DIAGNOSIS — M1990 Unspecified osteoarthritis, unspecified site: Secondary | ICD-10-CM | POA: Diagnosis present

## 2022-06-09 DIAGNOSIS — E785 Hyperlipidemia, unspecified: Secondary | ICD-10-CM | POA: Diagnosis present

## 2022-06-09 DIAGNOSIS — Z91018 Allergy to other foods: Secondary | ICD-10-CM

## 2022-06-09 DIAGNOSIS — Z8673 Personal history of transient ischemic attack (TIA), and cerebral infarction without residual deficits: Secondary | ICD-10-CM

## 2022-06-09 DIAGNOSIS — N3000 Acute cystitis without hematuria: Secondary | ICD-10-CM | POA: Diagnosis present

## 2022-06-09 DIAGNOSIS — N390 Urinary tract infection, site not specified: Secondary | ICD-10-CM | POA: Diagnosis not present

## 2022-06-09 DIAGNOSIS — I480 Paroxysmal atrial fibrillation: Secondary | ICD-10-CM | POA: Diagnosis present

## 2022-06-09 DIAGNOSIS — T17908A Unspecified foreign body in respiratory tract, part unspecified causing other injury, initial encounter: Secondary | ICD-10-CM | POA: Diagnosis present

## 2022-06-09 DIAGNOSIS — R519 Headache, unspecified: Secondary | ICD-10-CM

## 2022-06-09 DIAGNOSIS — Z79899 Other long term (current) drug therapy: Secondary | ICD-10-CM

## 2022-06-09 LAB — URINALYSIS, ROUTINE W REFLEX MICROSCOPIC
Bilirubin Urine: NEGATIVE
Glucose, UA: NEGATIVE mg/dL
Ketones, ur: NEGATIVE mg/dL
Nitrite: NEGATIVE
Protein, ur: NEGATIVE mg/dL
Specific Gravity, Urine: 1.011 (ref 1.005–1.030)
WBC, UA: >50 WBC/hpf (ref 0–5)
pH: 6 (ref 5.0–8.0)

## 2022-06-09 LAB — CBC WITH DIFFERENTIAL/PLATELET
Abs Immature Granulocytes: 0.12 10*3/uL — ABNORMAL HIGH (ref 0.00–0.07)
Basophils Absolute: 0 10*3/uL (ref 0.0–0.1)
Basophils Relative: 0 %
Eosinophils Absolute: 0 10*3/uL (ref 0.0–0.5)
Eosinophils Relative: 0 %
HCT: 40.6 % (ref 39.0–52.0)
Hemoglobin: 13.3 g/dL (ref 13.0–17.0)
Immature Granulocytes: 2 %
Lymphocytes Relative: 6 %
Lymphs Abs: 0.4 10*3/uL — ABNORMAL LOW (ref 0.7–4.0)
MCH: 27.9 pg (ref 26.0–34.0)
MCHC: 32.8 g/dL (ref 30.0–36.0)
MCV: 85.1 fL (ref 80.0–100.0)
Monocytes Absolute: 0.5 10*3/uL (ref 0.1–1.0)
Monocytes Relative: 6 %
Neutro Abs: 6.7 10*3/uL (ref 1.7–7.7)
Neutrophils Relative %: 86 %
Platelets: 254 10*3/uL (ref 150–400)
RBC: 4.77 MIL/uL (ref 4.22–5.81)
RDW: 15.3 % (ref 11.5–15.5)
WBC: 7.7 10*3/uL (ref 4.0–10.5)
nRBC: 0 % (ref 0.0–0.2)

## 2022-06-09 LAB — COMPREHENSIVE METABOLIC PANEL
ALT: 15 U/L (ref 0–44)
AST: 24 U/L (ref 15–41)
Albumin: 3.3 g/dL — ABNORMAL LOW (ref 3.5–5.0)
Alkaline Phosphatase: 71 U/L (ref 38–126)
Anion gap: 10 (ref 5–15)
BUN: 22 mg/dL (ref 8–23)
CO2: 28 mmol/L (ref 22–32)
Calcium: 8.7 mg/dL — ABNORMAL LOW (ref 8.9–10.3)
Chloride: 90 mmol/L — ABNORMAL LOW (ref 98–111)
Creatinine, Ser: 1.01 mg/dL (ref 0.61–1.24)
GFR, Estimated: >60 mL/min (ref >60)
Glucose, Bld: 123 mg/dL — ABNORMAL HIGH (ref 70–99)
Potassium: 3.4 mmol/L — ABNORMAL LOW (ref 3.5–5.1)
Sodium: 128 mmol/L — ABNORMAL LOW (ref 135–145)
Total Bilirubin: 0.7 mg/dL (ref 0.3–1.2)
Total Protein: 7.3 g/dL (ref 6.5–8.1)

## 2022-06-09 LAB — TROPONIN I (HIGH SENSITIVITY): Troponin I (High Sensitivity): 17 ng/L (ref <18)

## 2022-06-09 MED ORDER — HYDRALAZINE HCL 20 MG/ML IJ SOLN
10.0000 mg | Freq: Once | INTRAMUSCULAR | Status: AC
  Administered 2022-06-09: 10 mg via INTRAVENOUS
  Filled 2022-06-09: qty 1

## 2022-06-09 MED ORDER — LORAZEPAM 2 MG/ML IJ SOLN
1.0000 mg | Freq: Once | INTRAMUSCULAR | Status: AC
  Administered 2022-06-09: 1 mg via INTRAVENOUS
  Filled 2022-06-09: qty 1

## 2022-06-09 MED ORDER — SODIUM CHLORIDE 0.9 % IV SOLN
1.0000 g | Freq: Once | INTRAVENOUS | Status: AC
  Administered 2022-06-09: 1 g via INTRAVENOUS
  Filled 2022-06-09: qty 10

## 2022-06-09 MED ORDER — DILTIAZEM HCL 25 MG/5ML IV SOLN
10.0000 mg | Freq: Once | INTRAVENOUS | Status: AC
  Administered 2022-06-09: 10 mg via INTRAVENOUS
  Filled 2022-06-09: qty 5

## 2022-06-09 MED ORDER — DIPHENHYDRAMINE HCL 50 MG/ML IJ SOLN
25.0000 mg | Freq: Once | INTRAMUSCULAR | Status: AC
  Administered 2022-06-09: 25 mg via INTRAVENOUS
  Filled 2022-06-09: qty 1

## 2022-06-09 MED ORDER — CEPHALEXIN 500 MG PO CAPS: 500.0000 mg | ORAL_CAPSULE | Freq: Three times a day (TID) | ORAL | 0 refills | Status: DC

## 2022-06-09 MED ORDER — METOCLOPRAMIDE HCL 5 MG/ML IJ SOLN
10.0000 mg | Freq: Once | INTRAMUSCULAR | Status: AC
  Administered 2022-06-09: 10 mg via INTRAVENOUS
  Filled 2022-06-09: qty 2

## 2022-06-09 NOTE — ED Triage Notes (Signed)
Patient BIB EMS from [facility] place ) c/o migraine headache. Pt report migraine since this morning. Per report pt had PRN migraine medication without relief. Pt denies N/V. Pt a/ox 4.

## 2022-06-09 NOTE — ED Provider Notes (Signed)
[facility] Provider Note   CSN: [id number] Arrival date & time: 06/09/22  2029     History  Chief Complaint  Patient presents with   Migraine    Steven Simmons is a 82 y.o. male here presenting with headaches.  Patient has history of A-fib not on anticoagulation appropriately also disease and hypertension.  Patient states that this morning he started having headaches.  Patient states that headaches are frontal in nature.  Patient is from a nursing home recently had pneumonia.  He was given some Tylenol and he states that it has not helped with his headache.  Patient was noted to be hypertensive per EMS.  Patient was seen here several days ago for abdominal pain and unremarkable workup in the ED.  The history is provided by the patient.       Home Medications Prior to Admission medications   Medication Sig Start Date End Date Taking? Authorizing Provider  acetaminophen (TYLENOL) 500 MG tablet Take 1,000 mg by mouth daily as needed for mild pain.    [provider]  diltiazem (CARDIZEM CD) 120 MG 24 hr capsule Take 1 capsule (120 mg total) by mouth daily. 03/03/22 04/02/22  Regan Lemming, MD  levofloxacin (LEVAQUIN) 750 MG tablet Take 1 tablet (750 mg total) by mouth daily. 05/20/22   Sherrill Raring, PA-C  methylPREDNISolone (MEDROL DOSEPAK) 4 MG TBPK tablet Taper as directed 03/15/22   Little Ishikawa, MD  pantoprazole (PROTONIX) 40 MG tablet Take 1 tablet (40 mg total) by mouth daily. 05/20/22 06/19/22  Sherrill Raring, PA-C  potassium chloride SA (KLOR-CON M) 20 MEQ tablet Take 1 tablet (20 mEq total) by mouth daily for 7 days. Patient not taking: Reported on 03/03/2022 01/18/22 01/25/22  Wells Guiles, DO      Allergies    Ceclor [cefaclor]; Veralipride; Penicillins; Amlodipine; Antihistamines, chlorpheniramine-type; Beta adrenergic blockers; Celebrex [celecoxib]; Ibuprofen; Lipitor [atorvastatin]; and Nutrasweet aspartame  [aspartame]    Review of Systems   Review of Systems  Neurological:  Positive for headaches.  All other systems reviewed and are negative.   Physical Exam Updated Vital Signs BP (!) 169/80   Pulse (!) 102   Temp 99.3 F (37.4 C) (Oral)   Resp 15   SpO2 94%  Physical Exam Vitals and nursing note reviewed.  Constitutional:      Comments: Chronically ill  HENT:     Head: Normocephalic.     Nose: Nose normal.     Mouth/Throat:     Mouth: Mucous membranes are moist.  Eyes:     Extraocular Movements: Extraocular movements intact.     Pupils: Pupils are equal, round, and reactive to light.  Cardiovascular:     Rate and Rhythm: Normal rate and regular rhythm.     Pulses: Normal pulses.  Pulmonary:     Effort: Pulmonary effort is normal.     Breath sounds: Normal breath sounds.  Abdominal:     General: Abdomen is flat.     Palpations: Abdomen is soft.  Musculoskeletal:        General: Normal range of motion.     Cervical back: Normal range of motion and neck supple.  Skin:    General: Skin is warm.     Capillary Refill: Capillary refill takes less than 2 seconds.  Neurological:     General: No focal deficit present.     Mental Status: He is oriented to person, place, and time.  Comments: Cranial nerves II to XII intact.  Patient has normal strength and sensation bilateral arms and legs.  Psychiatric:        Mood and Affect: Mood normal.        Behavior: Behavior normal.     ED Results / Procedures / Treatments   Labs (all labs ordered are listed, but only abnormal results are displayed) Labs Reviewed  CBC WITH DIFFERENTIAL/PLATELET - Abnormal; Notable for the following components:      Result Value   Lymphs Abs 0.4 (*)    Abs Immature Granulocytes 0.12 (*)    All other components within normal limits  COMPREHENSIVE METABOLIC PANEL - Abnormal; Notable for the following components:   Sodium 128 (*)    Potassium 3.4 (*)    Chloride 90 (*)    Glucose, Bld 123  (*)    Calcium 8.7 (*)    Albumin 3.3 (*)    All other components within normal limits  URINALYSIS, ROUTINE W REFLEX MICROSCOPIC - Abnormal; Notable for the following components:   APPearance HAZY (*)    Hgb urine dipstick SMALL (*)    Leukocytes,Ua LARGE (*)    Bacteria, UA FEW (*)    All other components within normal limits  TROPONIN I (HIGH SENSITIVITY)  TROPONIN I (HIGH SENSITIVITY)    EKG EKG Interpretation  Date/Time:  Wednesday June 09 2022 21:09:32 EST Ventricular Rate:  93 PR Interval:    QRS Duration: 91 QT Interval:  358 QTC Calculation: 446 R Axis:   -3 Text Interpretation: Atrial fibrillation No significant change since last tracing Confirmed by Wandra Arthurs [phone]) on 06/09/2022 9:13:33 PM  Radiology DG Chest Port 1 View  Result Date: 06/09/2022 CLINICAL DATA:  Nausea EXAM: PORTABLE CHEST 1 VIEW COMPARISON:  05/29/2022 FINDINGS: Cardiac shadow is within normal limits. Aortic calcifications are again seen. Persistent left retrocardiac density is noted without significant increase. No new focal infiltrate is noted. No bony abnormality is seen. IMPRESSION: Stable left retrocardiac opacity. Electronically Signed   By: Inez Catalina M.D.   On: 06/09/2022 21:28   CT HEAD WO CONTRAST (5MM)  Result Date: 06/09/2022 CLINICAL DATA:  Headache EXAM: CT HEAD WITHOUT CONTRAST TECHNIQUE: Contiguous axial images were obtained from the base of the skull through the vertex without intravenous contrast. RADIATION DOSE REDUCTION: This exam was performed according to the departmental dose-optimization program which includes automated exposure control, adjustment of the mA and/or kV according to patient size and/or use of iterative reconstruction technique. COMPARISON:  07/28/2015 FINDINGS: Brain: There is atrophy and chronic small vessel disease changes. No acute intracranial abnormality. Specifically, no hemorrhage, hydrocephalus, mass lesion, acute infarction, or significant intracranial  injury. Vascular: No hyperdense vessel or unexpected calcification. Skull: No acute calvarial abnormality. Sinuses/Orbits: No acute findings Other: None IMPRESSION: Atrophy, chronic microvascular disease. No acute intracranial abnormality. Electronically Signed   By: Rolm Baptise M.D.   On: 06/09/2022 21:11    Procedures Procedures    Medications Ordered in ED Medications  cefTRIAXone (ROCEPHIN) 1 g in sodium chloride 0.9 % 100 mL IVPB (has no administration in time range)  hydrALAZINE (APRESOLINE) injection 10 mg (10 mg Intravenous Given 06/09/22 2117)  metoCLOPramide (REGLAN) injection 10 mg (10 mg Intravenous Given 06/09/22 2116)  diphenhydrAMINE (BENADRYL) injection 25 mg (25 mg Intravenous Given 06/09/22 2117)  LORazepam (ATIVAN) injection 1 mg (1 mg Intravenous Given 06/09/22 2205)  diltiazem (CARDIZEM) injection 10 mg (10 mg Intravenous Given 06/09/22 2206)    ED Course/ Medical Decision  Making/ A&P                             Medical Decision Making Steven Simmons is a 82 y.o. male here presenting with headache.  Patient had recent pneumonia and now has acute onset of headache.  Patient is hypertensive in the ED.  Will get CT head to rule out bleed or subarachnoid hemorrhage.  Will also get basic blood work and chest x-ray and give migraine cocktail and reassess  10:24 PM Patient briefly became agitated after given Reglan.  He is now comfortable sleeping after given Ativan.  Patient's UA does show UTI.  Patient was given Rocephin in the ED.  Patient's sodium is 128 which is slightly lower than 130 several days ago.  CT head is unremarkable.  Blood pressure is down to 160s from 200.  Patient is now comfortably sleeping.  At this point, patient is stable for discharge back to facility.  Of note patient has cephalosporin but the allergy was slower heart rate and sensation of fading away.  I think at this point he can receive a course of Keflex for UTI  Problems Addressed: Nonintractable  headache, unspecified chronicity pattern, unspecified headache type: acute illness or injury Urinary tract infection without hematuria, site unspecified: acute illness or injury  Amount and/or Complexity of Data Reviewed Labs: ordered. Decision-making details documented in ED Course. Radiology: ordered and independent interpretation performed. Decision-making details documented in ED Course. ECG/medicine tests: ordered.  Risk Prescription drug management.    Final Clinical Impression(s) / ED Diagnoses Final diagnoses:  None    Rx / DC Orders ED Discharge Orders     None         Drenda Freeze, MD 06/09/22 2228

## 2022-06-09 NOTE — Discharge Instructions (Addendum)
Take keflex three times daily for a week for UTI   Your CT head was unremarkable today.  Your sodium level is slightly low.  Please recheck it in a week and stay hydrated  Please get blood pressure daily and he may need to be started on blood pressure medicine if it is persistently elevated  See your doctor for follow-up  Return to ER if you have worse headache, vomiting, weakness, chest pain, trouble urinating

## 2022-06-09 NOTE — ED Notes (Signed)
Patient transported to CT 

## 2022-06-10 ENCOUNTER — Inpatient Hospital Stay (HOSPITAL_COMMUNITY): Payer: Medicare PPO

## 2022-06-10 ENCOUNTER — Encounter (HOSPITAL_COMMUNITY): Payer: Self-pay | Admitting: Internal Medicine

## 2022-06-10 DIAGNOSIS — Z1152 Encounter for screening for COVID-19: Secondary | ICD-10-CM | POA: Diagnosis not present

## 2022-06-10 DIAGNOSIS — I480 Paroxysmal atrial fibrillation: Secondary | ICD-10-CM | POA: Diagnosis present

## 2022-06-10 DIAGNOSIS — T17908A Unspecified foreign body in respiratory tract, part unspecified causing other injury, initial encounter: Secondary | ICD-10-CM | POA: Diagnosis not present

## 2022-06-10 DIAGNOSIS — E871 Hypo-osmolality and hyponatremia: Secondary | ICD-10-CM | POA: Diagnosis present

## 2022-06-10 DIAGNOSIS — I2510 Atherosclerotic heart disease of native coronary artery without angina pectoris: Secondary | ICD-10-CM | POA: Diagnosis present

## 2022-06-10 DIAGNOSIS — Z8673 Personal history of transient ischemic attack (TIA), and cerebral infarction without residual deficits: Secondary | ICD-10-CM | POA: Diagnosis not present

## 2022-06-10 DIAGNOSIS — N3000 Acute cystitis without hematuria: Secondary | ICD-10-CM | POA: Diagnosis not present

## 2022-06-10 DIAGNOSIS — N390 Urinary tract infection, site not specified: Secondary | ICD-10-CM | POA: Diagnosis present

## 2022-06-10 DIAGNOSIS — R7303 Prediabetes: Secondary | ICD-10-CM | POA: Diagnosis present

## 2022-06-10 DIAGNOSIS — E669 Obesity, unspecified: Secondary | ICD-10-CM | POA: Diagnosis present

## 2022-06-10 DIAGNOSIS — I10 Essential (primary) hypertension: Secondary | ICD-10-CM | POA: Diagnosis present

## 2022-06-10 DIAGNOSIS — E785 Hyperlipidemia, unspecified: Secondary | ICD-10-CM | POA: Diagnosis present

## 2022-06-10 DIAGNOSIS — Z8249 Family history of ischemic heart disease and other diseases of the circulatory system: Secondary | ICD-10-CM | POA: Diagnosis not present

## 2022-06-10 DIAGNOSIS — K219 Gastro-esophageal reflux disease without esophagitis: Secondary | ICD-10-CM | POA: Diagnosis present

## 2022-06-10 DIAGNOSIS — M1990 Unspecified osteoarthritis, unspecified site: Secondary | ICD-10-CM | POA: Diagnosis present

## 2022-06-10 DIAGNOSIS — J9811 Atelectasis: Secondary | ICD-10-CM | POA: Diagnosis not present

## 2022-06-10 DIAGNOSIS — Z66 Do not resuscitate: Secondary | ICD-10-CM | POA: Diagnosis not present

## 2022-06-10 DIAGNOSIS — Z79899 Other long term (current) drug therapy: Secondary | ICD-10-CM | POA: Diagnosis not present

## 2022-06-10 DIAGNOSIS — Z1623 Resistance to quinolones and fluoroquinolones: Secondary | ICD-10-CM | POA: Diagnosis present

## 2022-06-10 DIAGNOSIS — E441 Mild protein-calorie malnutrition: Secondary | ICD-10-CM | POA: Diagnosis present

## 2022-06-10 DIAGNOSIS — Z87891 Personal history of nicotine dependence: Secondary | ICD-10-CM | POA: Diagnosis not present

## 2022-06-10 DIAGNOSIS — Z6830 Body mass index (BMI) 30.0-30.9, adult: Secondary | ICD-10-CM | POA: Diagnosis not present

## 2022-06-10 DIAGNOSIS — G43909 Migraine, unspecified, not intractable, without status migrainosus: Secondary | ICD-10-CM | POA: Diagnosis present

## 2022-06-10 DIAGNOSIS — E876 Hypokalemia: Secondary | ICD-10-CM | POA: Diagnosis present

## 2022-06-10 LAB — CBC WITH DIFFERENTIAL/PLATELET
Abs Immature Granulocytes: 0.07 10*3/uL (ref 0.00–0.07)
Basophils Absolute: 0 10*3/uL (ref 0.0–0.1)
Basophils Relative: 0 %
Eosinophils Absolute: 0 10*3/uL (ref 0.0–0.5)
Eosinophils Relative: 0 %
HCT: 39.9 % (ref 39.0–52.0)
Hemoglobin: 13.2 g/dL (ref 13.0–17.0)
Immature Granulocytes: 1 %
Lymphocytes Relative: 7 %
Lymphs Abs: 0.6 10*3/uL — ABNORMAL LOW (ref 0.7–4.0)
MCH: 28 pg (ref 26.0–34.0)
MCHC: 33.1 g/dL (ref 30.0–36.0)
MCV: 84.5 fL (ref 80.0–100.0)
Monocytes Absolute: 0.5 10*3/uL (ref 0.1–1.0)
Monocytes Relative: 7 %
Neutro Abs: 7 10*3/uL (ref 1.7–7.7)
Neutrophils Relative %: 85 %
Platelets: 262 10*3/uL (ref 150–400)
RBC: 4.72 MIL/uL (ref 4.22–5.81)
RDW: 15.4 % (ref 11.5–15.5)
WBC: 8.2 10*3/uL (ref 4.0–10.5)
nRBC: 0 % (ref 0.0–0.2)

## 2022-06-10 LAB — GLUCOSE, CAPILLARY
Glucose-Capillary: 143 mg/dL — ABNORMAL HIGH (ref 70–99)
Glucose-Capillary: 136 mg/dL — ABNORMAL HIGH (ref 70–99)
Glucose-Capillary: 130 mg/dL — ABNORMAL HIGH (ref 70–99)

## 2022-06-10 LAB — COMPREHENSIVE METABOLIC PANEL
ALT: 16 U/L (ref 0–44)
AST: 25 U/L (ref 15–41)
Albumin: 3.4 g/dL — ABNORMAL LOW (ref 3.5–5.0)
Alkaline Phosphatase: 73 U/L (ref 38–126)
Anion gap: 9 (ref 5–15)
BUN: 20 mg/dL (ref 8–23)
CO2: 24 mmol/L (ref 22–32)
Calcium: 8.5 mg/dL — ABNORMAL LOW (ref 8.9–10.3)
Chloride: 93 mmol/L — ABNORMAL LOW (ref 98–111)
Creatinine, Ser: 1 mg/dL (ref 0.61–1.24)
GFR, Estimated: >60 mL/min (ref >60)
Glucose, Bld: 144 mg/dL — ABNORMAL HIGH (ref 70–99)
Potassium: 3.3 mmol/L — ABNORMAL LOW (ref 3.5–5.1)
Sodium: 126 mmol/L — ABNORMAL LOW (ref 135–145)
Total Bilirubin: 0.9 mg/dL (ref 0.3–1.2)
Total Protein: 7.3 g/dL (ref 6.5–8.1)

## 2022-06-10 LAB — LACTIC ACID, PLASMA
Lactic Acid, Venous: 1.1 mmol/L (ref 0.5–1.9)
Lactic Acid, Venous: 1.5 mmol/L (ref 0.5–1.9)

## 2022-06-10 LAB — RESP PANEL BY RT-PCR (RSV, FLU A&B, COVID)  RVPGX2
Influenza A by PCR: NEGATIVE
Influenza B by PCR: NEGATIVE
Resp Syncytial Virus by PCR: NEGATIVE
SARS Coronavirus 2 by RT PCR: NEGATIVE

## 2022-06-10 LAB — PROTIME-INR
INR: 1.1 (ref 0.8–1.2)
Prothrombin Time: 14.4 seconds (ref 11.4–15.2)

## 2022-06-10 LAB — MRSA NEXT GEN BY PCR, NASAL: MRSA by PCR Next Gen: NOT DETECTED

## 2022-06-10 LAB — APTT: aPTT: 31 seconds (ref 24–36)

## 2022-06-10 LAB — MAGNESIUM: Magnesium: 1.5 mg/dL — ABNORMAL LOW (ref 1.7–2.4)

## 2022-06-10 LAB — PROCALCITONIN: Procalcitonin: 0.33 ng/mL

## 2022-06-10 LAB — SODIUM, URINE, RANDOM: Sodium, Ur: 28 mmol/L

## 2022-06-10 MED ORDER — MAGNESIUM SULFATE 2 GM/50ML IV SOLN
2.0000 g | Freq: Once | INTRAVENOUS | Status: AC
  Administered 2022-06-10: 2 g via INTRAVENOUS
  Filled 2022-06-10: qty 50

## 2022-06-10 MED ORDER — SODIUM CHLORIDE 0.9 % IV SOLN
2.0000 g | Freq: Three times a day (TID) | INTRAVENOUS | Status: DC
  Administered 2022-06-10 – 2022-06-12 (×7): 2 g via INTRAVENOUS
  Filled 2022-06-10 (×7): qty 12.5

## 2022-06-10 MED ORDER — METOPROLOL TARTRATE 5 MG/5ML IV SOLN: 5.0000 mg | INTRAVENOUS | Status: DC | PRN

## 2022-06-10 MED ORDER — ONDANSETRON HCL 4 MG/2ML IJ SOLN: 4.0000 mg | Freq: Four times a day (QID) | INTRAMUSCULAR | Status: DC | PRN

## 2022-06-10 MED ORDER — LACTATED RINGERS IV BOLUS
1000.0000 mL | Freq: Once | INTRAVENOUS | Status: AC
  Administered 2022-06-10: 1000 mL via INTRAVENOUS

## 2022-06-10 MED ORDER — DILTIAZEM HCL 25 MG/5ML IV SOLN
10.0000 mg | Freq: Once | INTRAVENOUS | Status: AC
  Administered 2022-06-10: 10 mg via INTRAVENOUS
  Filled 2022-06-10: qty 5

## 2022-06-10 MED ORDER — VANCOMYCIN HCL IN DEXTROSE 1-5 GM/200ML-% IV SOLN
1000.0000 mg | Freq: Once | INTRAVENOUS | Status: AC
  Administered 2022-06-10: 1000 mg via INTRAVENOUS
  Filled 2022-06-10: qty 200

## 2022-06-10 MED ORDER — LACTATED RINGERS IV BOLUS (SEPSIS)
1000.0000 mL | Freq: Once | INTRAVENOUS | Status: AC
  Administered 2022-06-10: 1000 mL via INTRAVENOUS

## 2022-06-10 MED ORDER — INSULIN ASPART 100 UNIT/ML IJ SOLN
0.0000 [IU] | Freq: Three times a day (TID) | INTRAMUSCULAR | Status: DC
  Administered 2022-06-10: 2 [IU] via SUBCUTANEOUS
  Administered 2022-06-12: 1 [IU] via SUBCUTANEOUS
  Administered 2022-06-12 – 2022-06-13 (×3): 2 [IU] via SUBCUTANEOUS

## 2022-06-10 MED ORDER — MELATONIN 3 MG PO TABS: 3.0000 mg | ORAL_TABLET | Freq: Every evening | ORAL | Status: DC | PRN

## 2022-06-10 MED ORDER — ACETAMINOPHEN 325 MG PO TABS
650.0000 mg | ORAL_TABLET | Freq: Four times a day (QID) | ORAL | Status: DC | PRN
  Administered 2022-06-11 – 2022-06-12 (×3): 650 mg via ORAL
  Filled 2022-06-10 (×3): qty 2

## 2022-06-10 MED ORDER — ORAL CARE MOUTH RINSE: 15.0000 mL | OROMUCOSAL | Status: DC | PRN

## 2022-06-10 MED ORDER — VANCOMYCIN HCL 1500 MG/300ML IV SOLN: 1500.0000 mg | INTRAVENOUS | Status: DC

## 2022-06-10 MED ORDER — SODIUM CHLORIDE 0.9 % IV SOLN
2.0000 g | Freq: Once | INTRAVENOUS | Status: AC
  Administered 2022-06-10: 2 g via INTRAVENOUS
  Filled 2022-06-10: qty 12.5

## 2022-06-10 MED ORDER — ACETAMINOPHEN 650 MG RE SUPP: 650.0000 mg | Freq: Four times a day (QID) | RECTAL | Status: DC | PRN

## 2022-06-10 MED ORDER — LACTATED RINGERS IV SOLN: INTRAVENOUS | Status: AC

## 2022-06-10 MED ORDER — HYDRALAZINE HCL 20 MG/ML IJ SOLN
10.0000 mg | Freq: Once | INTRAMUSCULAR | Status: AC
  Administered 2022-06-10: 10 mg via INTRAVENOUS
  Filled 2022-06-10: qty 1

## 2022-06-10 MED ORDER — ACETAMINOPHEN 325 MG PO TABS
650.0000 mg | ORAL_TABLET | Freq: Once | ORAL | Status: AC
  Administered 2022-06-10: 650 mg via ORAL
  Filled 2022-06-10: qty 2

## 2022-06-10 MED ORDER — ORAL CARE MOUTH RINSE
15.0000 mL | OROMUCOSAL | Status: DC
  Administered 2022-06-10 – 2022-06-14 (×15): 15 mL via OROMUCOSAL

## 2022-06-10 MED ORDER — CHLORHEXIDINE GLUCONATE CLOTH 2 % EX PADS
6.0000 | MEDICATED_PAD | Freq: Every day | CUTANEOUS | Status: DC
  Administered 2022-06-10 – 2022-06-14 (×5): 6 via TOPICAL

## 2022-06-10 MED ORDER — PANTOPRAZOLE SODIUM 40 MG PO TBEC
40.0000 mg | DELAYED_RELEASE_TABLET | Freq: Every day | ORAL | Status: DC
  Administered 2022-06-10: 40 mg via ORAL
  Filled 2022-06-10: qty 1

## 2022-06-10 MED ORDER — METRONIDAZOLE 500 MG/100ML IV SOLN
500.0000 mg | Freq: Once | INTRAVENOUS | Status: AC
  Administered 2022-06-10: 500 mg via INTRAVENOUS
  Filled 2022-06-10: qty 100

## 2022-06-10 MED ORDER — POTASSIUM CHLORIDE CRYS ER 20 MEQ PO TBCR
40.0000 meq | EXTENDED_RELEASE_TABLET | Freq: Once | ORAL | Status: AC
  Administered 2022-06-10: 40 meq via ORAL
  Filled 2022-06-10: qty 2

## 2022-06-10 NOTE — Progress Notes (Signed)
  Carryover admission to the Day Admitter.  I discussed this case with the EDP, Dr.  Wyvonnia Dusky.  Per these discussions:   This is a 82 year old male with dementia, paroxysmal atrial fibrillation, not chronically anticoagulated, who is being admitted with sepsis due to urinary tract infection, with presentation also notable for atrial fibrillation with RVR, after the patient presented for evaluation of objective fever and dysuria.  He also noted an initial headache, that spontaneously resolved, without subsequent recurrence.  Not associate with any neck stiffness.   Vital signs notable for temperature max 102.  Noted to be in atrial fibrillation with RVR, with initial heart rates in the 130s, which is subsequently improved into the 1 teens with initiation of septic management, including broad-spectrum IV antibiotics, as well as IV fluids.  He has remained normotensive.  Currently on 4 L nasal cannula with O2 sats in the high 90s.  Per my discussions with the EDP, this is in the setting of no documented preceding hypoxia.  Labs notable for urinalysis with significant pyuria.  Lactic acid 1.0.  He is also noted to be mildly hyponatremic.  Blood cultures x 2 were collected prior to initiation of broad-spectrum IV antibiotics in the form of IV vancomycin, cefepime, and Flagyl.  COVID, RSV, and influenza PCR were all negative.  Chest x-ray reported to be clear.   I have placed an order for inpatient mission to the stepdown unit.  I have placed some additional preliminary admit orders via the adult multi-morbid admission order set. I have also ordered DNR status per CODE STATUS documentation from most recent prior hospitalization.  I have discontinued IV vancomycin, but continued existing order for cefepime.  Added on procalcitonin level.  Also ordered as needed IV Lopressor for sustained heart rate greater than 130 and ordered serum magnesium level for the morning.    Babs Bertin, DO Hospitalist

## 2022-06-10 NOTE — ED Notes (Addendum)
Pt. Soiled brief with urine and chuck pad. pt.cleaned up and new brief and male pure-wick applied and chuck pad.

## 2022-06-10 NOTE — ED Provider Notes (Signed)
Patient was discharged by previous shift.  Nursing staff reports he is confused, tachycardic and febrile.  He was seen earlier for a headache and UTI symptoms.  On exam, patient is somnolent, tachycardic to the 130s, slow to respond.  He does follow commands and answers questions appropriately.  Denies any pain.  Denies any headache.  He is moving his neck freely.  Discharge is canceled.  Will initiate code sepsis and give IV fluids and broad-spectrum antibiotics after cultures were obtained.  He did receive Rocephin earlier for UTI. His CT head was negative.  He received several sedating medications including Reglan, Benadryl, Ativan.  Unclear what his baseline mental status is.  Lactate is normal.  Labs show worsening hyponatremia to 126.  Suspect likely sepsis secondary to his UTI.  Patient given IV fluids and IV antibiotics.  Antibiotics escalated to cefepime, vancomycin and Flagyl.  Found to be in atrial fibrillation with RVR.  Dose of Cardizem given.  Remains tachypneic, tachycardic, febrile but mentating well.  Not hypoxic.  Suspect sepsis secondary to UTI.  Repeat labs are not significantly changed.  Mild worsening hyponatremia.  Admission discussed with Dr. Velia Meyer.  .Critical Care  Performed by: [name], MD Authorized by: [name], MD   Critical care provider statement:    Critical care time (minutes):  45   Critical care time was exclusive of:  Separately billable procedures and treating other patients   Critical care was necessary to treat or prevent imminent or life-threatening deterioration of the following conditions:  Sepsis   Critical care was time spent personally by me on the following activities:  Development of treatment plan with patient or surrogate, discussions with consultants, evaluation of patient's response to treatment, examination of patient, ordering and review of laboratory studies, ordering and review of radiographic studies, ordering and  performing treatments and interventions, pulse oximetry, re-evaluation of patient's condition, review of old charts and obtaining history from patient or surrogate   I assumed direction of critical care for this patient from another provider in my specialty: no     Care discussed with: admitting provider       [name], MD 06/10/22 [phone]

## 2022-06-10 NOTE — H&P (Signed)
History and Physical    Patient: Steven Simmons [id number] DOB: 05/02/1940 DOA: 06/09/2022 DOS: the patient was seen and examined on 06/10/2022 PCP: [name], MD  Patient coming from: Home  Chief Complaint:  Chief Complaint  Patient presents with   Migraine   HPI: Steven Simmons is a 82 y.o. male with medical history significant of allergies, osteoarthritis, aspiration pneumonia, PUD, history of UGI bleed, history of lower GI bleed, cataracts, GERD, heart murmur as a child, history of TIA, hypertension, migraine headaches, history of sepsis due to UTI, recent pneumonia who presented to the emergency department via EMS from [facility] with a frontal migraine headache that had not responded to treatment.  He is disoriented and unable to provide more meaningful HPI, but able to answer simple questions.  No headache, dyspnea, chest or abdominal pain at this time.  ED course: Initial vital signs were temperature 99.3 F, pulse 109, respirations 20, BP 206/100 mmHg O2 sat 97% on room air.  The patient received acetaminophen 650 mg p.o. x 1, diltiazem 10 mg IVP x 2, hydralazine 10 mg IVP x 1, Benadryl 25 mg IVP x 1, metoclopramide 10 mg IVP x 1, lorazepam 1 mg IVP x 1, 3000 mL of LR bolus, vancomycin metronidazole and cefepime.  Lab work: Urinalysis was hazy with small hemoglobinuria and large leukocyte esterase, more than 50 WBC and few bacteria microscopic examination.  CBC showed a white count of 7.7, Monnin 13.3 g/dL platelets 254.  CMP showed a sodium 128, potassium 3.4, chloride 90 and CO2 28 mmol/L.  Renal function was normal.  Glucose 123 mg/dL.  Calcium is normal after correction to an albumin of 3.3 g/dL.  The rest of the LFTs were normal.  Imaging: Portable 1 view chest radiograph showing a stable left retrocardiac opacity.  CT head without contrast showing atrophy, chronic microvascular disease but no acute intracranial abnormality.   Review of Systems: As mentioned in the history  of present illness. All other systems reviewed and are negative. Past Medical History:  Diagnosis Date   Allergy    Arthritis    shoulders (08/23/2017)   Aspiration pneumonia ([place]) 08/23/2017   Bleeding stomach ulcer 1988   Cataract    Complication of anesthesia 1993   " dont' know the name of it but they gave it to me at [facility] in 1993 "   I was cold, shaking and had a headache for hours afterwards "   GERD (gastroesophageal reflux disease)    Heart murmur    "when I was a child"   History of blood transfusion 1988   "bleeding ulcer"     History of TIA (transient ischemic attack) 07/2015   pt denies this hx on 08/23/2017   Hypertension    Migraine 1959-1980   "did have 4 ~ 2 wks ago; just the aura then" (08/23/2017)   Peptic ulcer disease    Sepsis secondary to UTI ([place]) 08/22/2017   Past Surgical History:  Procedure Laterality Date   CATARACT EXTRACTION W/ INTRAOCULAR LENS  IMPLANT, BILATERAL Bilateral    COLONOSCOPY  2012 ?   EXCISIONAL HEMORRHOIDECTOMY  1993   LAPAROSCOPIC CHOLECYSTECTOMY     Social History:  reports that he quit smoking about 45 years ago. His smoking use included cigarettes and cigars. He has never used smokeless tobacco. He reports that he does not drink alcohol and does not use drugs.  Allergies  Allergen Reactions   Ceclor [Cefaclor] Other (See Comments)    Makes my  heart slow and I feel like I am fading away   Veralipride Other (See Comments)    Gums bleed, tooth aches   Penicillins Rash and Other (See Comments)     Has patient had a PCN reaction causing immediate rash, facial/tongue/throat swelling, SOB or lightheadedness with hypotension: yes- only rash Has patient had a PCN reaction causing severe rash involving mucus membranes or skin necrosis: No Has patient had a PCN reaction that required hospitalization: No Has patient had a PCN reaction occurring within the last 10 years: No If all of the above answers are "NO", then may proceed with  Cephalosporin use.   Amlodipine Other (See Comments)    Gum bleeding   Antihistamines, Chlorpheniramine-Type Other (See Comments)    Dizziness and nose bleeds. "Spaced out"    Beta Adrenergic Blockers Other (See Comments)    Gums bleed   Celebrex [Celecoxib] Other (See Comments)    Stomach pain   Ibuprofen Other (See Comments)    Stomach pain     Lipitor [Atorvastatin] Hypertension   Nutrasweet Aspartame [Aspartame] Other (See Comments)    Memory loss. "Spaced out"     Family History  Problem Relation Age of Onset   Heart disease Mother    Hypertension Mother    Heart disease Father    Hypertension Father    Colon cancer Father    Esophageal cancer Neg Hx    Rectal cancer Neg Hx    Stomach cancer Neg Hx     Prior to Admission medications   Medication Sig Start Date End Date Taking? Authorizing Provider  cephALEXin (KEFLEX) 500 MG capsule Take 1 capsule (500 mg total) by mouth 3 (three) times daily. 06/09/22  Yes Drenda Freeze, MD  acetaminophen (TYLENOL) 500 MG tablet Take 1,000 mg by mouth daily as needed for mild pain.    [provider]  diltiazem (CARDIZEM CD) 120 MG 24 hr capsule Take 1 capsule (120 mg total) by mouth daily. 03/03/22 04/02/22  Regan Lemming, MD  levofloxacin (LEVAQUIN) 750 MG tablet Take 1 tablet (750 mg total) by mouth daily. 05/20/22   Sherrill Raring, PA-C  methylPREDNISolone (MEDROL DOSEPAK) 4 MG TBPK tablet Taper as directed 03/15/22   Little Ishikawa, MD  pantoprazole (PROTONIX) 40 MG tablet Take 1 tablet (40 mg total) by mouth daily. 05/20/22 06/19/22  Sherrill Raring, PA-C  potassium chloride SA (KLOR-CON M) 20 MEQ tablet Take 1 tablet (20 mEq total) by mouth daily for 7 days. Patient not taking: Reported on 03/03/2022 01/18/22 01/25/22  Wells Guiles, DO    Physical Exam: Vitals:   06/10/22 0401 06/10/22 0515 06/10/22 0600 06/10/22 0700  BP: (!) 113/97 129/67 (!) 159/92 (!) 151/91  Pulse: (!) 120 (!) 112 (!) 51   Resp: (!) 30 (!) 29  20 (!) 25  Temp:  99 F (37.2 C)    TempSrc:  Oral    SpO2: 95% 93% 95%   Weight:  92.2 kg    Height:  '5\' 11"'$  (1.803 m)     Physical Exam Vitals and nursing note reviewed.  Constitutional:      General: He is awake. He is not in acute distress.    Appearance: Normal appearance. He is overweight.  HENT:     Head: Normocephalic.     Nose: No rhinorrhea.     Mouth/Throat:     Mouth: Mucous membranes are moist.  Eyes:     General: No scleral icterus.    Pupils: Pupils are equal,  round, and reactive to light.  Neck:     Vascular: No JVD.  Cardiovascular:     Rate and Rhythm: Normal rate and regular rhythm.     Heart sounds: S1 normal and S2 normal.  Pulmonary:     Effort: Pulmonary effort is normal.     Breath sounds: Normal breath sounds. No wheezing, rhonchi or rales.  Abdominal:     General: Bowel sounds are normal. There is no distension.     Palpations: Abdomen is soft.     Tenderness: There is no abdominal tenderness. There is no guarding.  Musculoskeletal:     Cervical back: Neck supple.     Right lower leg: No edema.     Left lower leg: No edema.  Skin:    General: Skin is warm and dry.  Neurological:     Mental Status: He is alert. He is disoriented.  Psychiatric:        Behavior: Behavior normal. Behavior is cooperative.   Data Reviewed:  Results are pending, will review when available.  03/12/2022 echocardiogram IMPRESSIONS:   1. Left ventricular ejection fraction, by estimation, is 50 to 55%. The  left ventricle has low normal function. The left ventricle has no regional  wall motion abnormalities. There is mild left ventricular hypertrophy.  Left ventricular diastolic  parameters are indeterminate.   2. Right ventricular systolic function is mildly reduced. The right  ventricular size is normal. There is moderately elevated pulmonary artery  systolic pressure.   3. Left atrial size was moderately dilated.   4. Right atrial size was mild to  moderately dilated.   5. Moderate pleural effusion.   6. The mitral valve is degenerative. Mild mitral valve regurgitation.   7. Aortic valve sclerosis/calcification is present, without any evidence  of aortic stenosis.   8. There is Moderate (Grade III) plaque.   9. The inferior vena cava is dilated in size with <50% respiratory  variability, suggesting right atrial pressure of 15 mmHg.   Assessment and Plan: Principal Problem:   Acute cystitis Admit to SDU/inpatient. Continue IV fluids. Continue cefepime 2 g every 8 hours.   Follow-up urine culture and sensitivity. Follow-up blood culture and sensitivity Follow CBC and CMP in a.m.  Active Problems:   Aspiration into airway  Negative chest chest radiograph. No changes seen oxygen saturation. SLP consult has been requested.    Hypokalemia Replacing. Follow-up potassium level.    Hyponatremia Continue IV fluids. Check urine sodium. Check urine osmolality. Follow-up sodium level.    Hypomagnesemia Replacement given. Follow level as needed.    HTN (hypertension) Blood pressures have been soft. Hold diltiazem and, lisinopril and HCTZ.    Paroxysmal atrial fibrillation (HCC) CHA2DS2-VASc Score of at least 7. Not on anticoagulation. Holding Cardizem due to soft BP.    HLD (hyperlipidemia) No therapy currently listed. Follow-up with primary.    Mild protein malnutrition (HCC) Protein supplementation. Consider nutritional services evaluation.    GERD (gastroesophageal reflux disease) Continue pantoprazole 40 mg p.o. daily.    Prediabetes Carbohydrate modified diet. CBG monitoring before meals and bedtime.   Advance Care Planning:   Code Status: DNR   Consults:   Family Communication:   Severity of Illness: The appropriate patient status for this patient is INPATIENT. Inpatient status is judged to be reasonable and necessary in order to provide the required intensity of service to ensure the patient's  safety. The patient's presenting symptoms, physical exam findings, and initial radiographic and laboratory data in the  context of their chronic comorbidities is felt to place them at high risk for further clinical deterioration. Furthermore, it is not anticipated that the patient will be medically stable for discharge from the hospital within 2 midnights of admission.   * I certify that at the point of admission it is my clinical judgment that the patient will require inpatient hospital care spanning beyond 2 midnights from the point of admission due to high intensity of service, high risk for further deterioration and high frequency of surveillance required.*  Author: Reubin Milan, MD 06/10/2022 7:56 AM  For on call review [email or web address].   This document was prepared using Dragon voice recognition software and may contain some unintended transcription errors.

## 2022-06-10 NOTE — Progress Notes (Signed)
Pharmacy Antibiotic Note  Steven Simmons is a 82 y.o. male admitted on 06/09/2022 with headache.  Pt is from nursing home recently had pna. Pharmacy has been consulted to dose vancomycin and cefepime for sepsis.  1st doses given in the ED  Plan: Vancomycin '1500mg'$  IV q24h (AUC 525.5, Scr 1.0) Cefepime 2gm IV q8h Follow renal function, cultures and clinical course     Temp (24hrs), Avg:99.9 F (37.7 C), Min:98 F (36.7 C), Max:102.3 F (39.1 C)  Recent Labs  Lab 06/09/22 2110 06/10/22 0230  WBC 7.7 8.2  CREATININE 1.01 1.00  LATICACIDVEN  --  1.1    Estimated Creatinine Clearance: 71.2 mL/min (by C-G formula based on SCr of 1 mg/dL).    Allergies  Allergen Reactions   Ceclor [Cefaclor] Other (See Comments)    Makes my heart slow and I feel like I am fading away   Veralipride Other (See Comments)    Gums bleed, tooth aches   Penicillins Rash and Other (See Comments)     Has patient had a PCN reaction causing immediate rash, facial/tongue/throat swelling, SOB or lightheadedness with hypotension: yes- only rash Has patient had a PCN reaction causing severe rash involving mucus membranes or skin necrosis: No Has patient had a PCN reaction that required hospitalization: No Has patient had a PCN reaction occurring within the last 10 years: No If all of the above answers are "NO", then may proceed with Cephalosporin use.   Amlodipine Other (See Comments)    Gum bleeding   Antihistamines, Chlorpheniramine-Type Other (See Comments)    Dizziness and nose bleeds. "Spaced out"    Beta Adrenergic Blockers Other (See Comments)    Gums bleed   Celebrex [Celecoxib] Other (See Comments)    Stomach pain   Ibuprofen Other (See Comments)    Stomach pain     Lipitor [Atorvastatin] Hypertension   Nutrasweet Aspartame [Aspartame] Other (See Comments)    Memory loss. "Spaced out"     Antimicrobials this admission: 3/7 vanc >> 3/7 cefepime >> 3/7 flagyl x1 3/6 CTX x1  Dose adjustments  this admission:   Microbiology results: 3/7 BCx:  3/7 UCx:   Thank you for allowing pharmacy to be a part of this patient's care. Dolly Rias RPh 06/10/2022, 3:19 AM

## 2022-06-10 NOTE — Sepsis Progress Note (Addendum)
Elink monitoring for the code sepsis protocol.  

## 2022-06-10 NOTE — ED Notes (Signed)
Attempted x2 to give report to facility. Nobody answering at this time. Will try again.

## 2022-06-10 NOTE — Progress Notes (Addendum)
SLP Cancellation Note  Patient Details Name: Steven Simmons MRN: [id number] DOB: 12-07-1940   Cancelled treatment:       Reason Eval/Treat Not Completed: Fatigue/lethargy limiting ability to participate;Other (comment) (per RN pt either alert and agitated or lethargic, will attempt later)  SLP helped reposition pt earlier today and afterward he "wanted to nap".   Kathleen Lime, MS [facility] SLP Acute Rehab Services Office [phone]   Steven Simmons 06/10/2022, 12:16 PM

## 2022-06-11 DIAGNOSIS — E876 Hypokalemia: Secondary | ICD-10-CM

## 2022-06-11 DIAGNOSIS — K219 Gastro-esophageal reflux disease without esophagitis: Secondary | ICD-10-CM

## 2022-06-11 DIAGNOSIS — T17908A Unspecified foreign body in respiratory tract, part unspecified causing other injury, initial encounter: Secondary | ICD-10-CM

## 2022-06-11 DIAGNOSIS — I480 Paroxysmal atrial fibrillation: Secondary | ICD-10-CM

## 2022-06-11 DIAGNOSIS — R7303 Prediabetes: Secondary | ICD-10-CM

## 2022-06-11 DIAGNOSIS — I10 Essential (primary) hypertension: Secondary | ICD-10-CM

## 2022-06-11 DIAGNOSIS — E441 Mild protein-calorie malnutrition: Secondary | ICD-10-CM

## 2022-06-11 DIAGNOSIS — B9620 Unspecified Escherichia coli [E. coli] as the cause of diseases classified elsewhere: Secondary | ICD-10-CM

## 2022-06-11 DIAGNOSIS — E871 Hypo-osmolality and hyponatremia: Secondary | ICD-10-CM

## 2022-06-11 DIAGNOSIS — E785 Hyperlipidemia, unspecified: Secondary | ICD-10-CM

## 2022-06-11 LAB — CBC WITH DIFFERENTIAL/PLATELET
Abs Immature Granulocytes: 0.05 10*3/uL (ref 0.00–0.07)
Basophils Absolute: 0 10*3/uL (ref 0.0–0.1)
Basophils Relative: 0 %
Eosinophils Absolute: 0 10*3/uL (ref 0.0–0.5)
Eosinophils Relative: 0 %
HCT: 35.1 % — ABNORMAL LOW (ref 39.0–52.0)
Hemoglobin: 11.5 g/dL — ABNORMAL LOW (ref 13.0–17.0)
Immature Granulocytes: 1 %
Lymphocytes Relative: 6 %
Lymphs Abs: 0.6 10*3/uL — ABNORMAL LOW (ref 0.7–4.0)
MCH: 28 pg (ref 26.0–34.0)
MCHC: 32.8 g/dL (ref 30.0–36.0)
MCV: 85.6 fL (ref 80.0–100.0)
Monocytes Absolute: 1 10*3/uL (ref 0.1–1.0)
Monocytes Relative: 10 %
Neutro Abs: 8.6 10*3/uL — ABNORMAL HIGH (ref 1.7–7.7)
Neutrophils Relative %: 83 %
Platelets: 217 10*3/uL (ref 150–400)
RBC: 4.1 MIL/uL — ABNORMAL LOW (ref 4.22–5.81)
RDW: 15.8 % — ABNORMAL HIGH (ref 11.5–15.5)
WBC: 10.3 10*3/uL (ref 4.0–10.5)
nRBC: 0 % (ref 0.0–0.2)

## 2022-06-11 LAB — OSMOLALITY, URINE: Osmolality, Ur: 505 mOsm/kg (ref 300–900)

## 2022-06-11 LAB — COMPREHENSIVE METABOLIC PANEL
ALT: 19 U/L (ref 0–44)
AST: 29 U/L (ref 15–41)
Albumin: 2.8 g/dL — ABNORMAL LOW (ref 3.5–5.0)
Alkaline Phosphatase: 65 U/L (ref 38–126)
Anion gap: 8 (ref 5–15)
BUN: 16 mg/dL (ref 8–23)
CO2: 24 mmol/L (ref 22–32)
Calcium: 7.9 mg/dL — ABNORMAL LOW (ref 8.9–10.3)
Chloride: 95 mmol/L — ABNORMAL LOW (ref 98–111)
Creatinine, Ser: 0.76 mg/dL (ref 0.61–1.24)
GFR, Estimated: >60 mL/min (ref >60)
Glucose, Bld: 112 mg/dL — ABNORMAL HIGH (ref 70–99)
Potassium: 3 mmol/L — ABNORMAL LOW (ref 3.5–5.1)
Sodium: 127 mmol/L — ABNORMAL LOW (ref 135–145)
Total Bilirubin: 0.9 mg/dL (ref 0.3–1.2)
Total Protein: 6.4 g/dL — ABNORMAL LOW (ref 6.5–8.1)

## 2022-06-11 LAB — GLUCOSE, CAPILLARY
Glucose-Capillary: 108 mg/dL — ABNORMAL HIGH (ref 70–99)
Glucose-Capillary: 109 mg/dL — ABNORMAL HIGH (ref 70–99)
Glucose-Capillary: 118 mg/dL — ABNORMAL HIGH (ref 70–99)
Glucose-Capillary: 119 mg/dL — ABNORMAL HIGH (ref 70–99)
Glucose-Capillary: 129 mg/dL — ABNORMAL HIGH (ref 70–99)
Glucose-Capillary: 156 mg/dL — ABNORMAL HIGH (ref 70–99)

## 2022-06-11 LAB — MAGNESIUM: Magnesium: 2.1 mg/dL (ref 1.7–2.4)

## 2022-06-11 LAB — C DIFFICILE QUICK SCREEN W PCR REFLEX
C Diff antigen: NEGATIVE
C Diff interpretation: NOT DETECTED
C Diff toxin: NEGATIVE

## 2022-06-11 MED ORDER — POTASSIUM CHLORIDE CRYS ER 20 MEQ PO TBCR
40.0000 meq | EXTENDED_RELEASE_TABLET | Freq: Two times a day (BID) | ORAL | Status: AC
  Administered 2022-06-11: 40 meq via ORAL
  Filled 2022-06-11 (×2): qty 2

## 2022-06-11 MED ORDER — SODIUM CHLORIDE 0.9 % IV SOLN: INTRAVENOUS | Status: DC

## 2022-06-11 MED ORDER — SUCRALFATE 1 G PO TABS
1.0000 g | ORAL_TABLET | Freq: Three times a day (TID) | ORAL | Status: DC
  Administered 2022-06-11 – 2022-06-14 (×12): 1 g via ORAL
  Filled 2022-06-11 (×12): qty 1

## 2022-06-11 MED ORDER — FAMOTIDINE 20 MG PO TABS
20.0000 mg | ORAL_TABLET | Freq: Two times a day (BID) | ORAL | Status: DC
  Administered 2022-06-11 – 2022-06-14 (×6): 20 mg via ORAL
  Filled 2022-06-11 (×7): qty 1

## 2022-06-11 MED ORDER — SODIUM CHLORIDE 0.9 % IV SOLN: INTRAVENOUS | Status: AC

## 2022-06-11 MED ORDER — HYDRALAZINE HCL 20 MG/ML IJ SOLN
10.0000 mg | Freq: Four times a day (QID) | INTRAMUSCULAR | Status: DC | PRN
  Administered 2022-06-11: 10 mg via INTRAVENOUS
  Filled 2022-06-11 (×2): qty 1

## 2022-06-11 MED ORDER — POTASSIUM CHLORIDE 10 MEQ/100ML IV SOLN
10.0000 meq | INTRAVENOUS | Status: DC
  Administered 2022-06-11 (×3): 10 meq via INTRAVENOUS
  Filled 2022-06-11 (×4): qty 100

## 2022-06-11 MED ORDER — PANTOPRAZOLE SODIUM 40 MG IV SOLR
40.0000 mg | INTRAVENOUS | Status: DC
  Administered 2022-06-11 – 2022-06-14 (×4): 40 mg via INTRAVENOUS
  Filled 2022-06-11 (×4): qty 10

## 2022-06-11 NOTE — Progress Notes (Signed)
Pharmacy Antibiotic Note  Steven Simmons is a 82 y.o. male admitted on 06/09/2022 with  urosepsis .  Pharmacy has been consulted for Cefepime dosing.  ID: Sepsis from UTI - PCT 0.33 - Tm 102.3>>100.3, WBC 10.3 up   Antimicrobials this admission:  3/6 Ceftriaxone x1 3/7 metronidazole x1 3/7 Vancomycin x1 3/7 Cefepime >>    Dose adjustments this admission:    Microbiology results:  3/6 UA: few bacteria, Sq cells 0-5, WBC > 50 3/6 UCx: >100,000 GNR 3/7 Resp PCR: neg covid/flu/rsv 3/7 BCx: NGTD 3/7 MRSA PCR: not detected   Plan: Cefepime 2g IV q8hr. Pharmacy will sign off. Please reconsult for further dosing assistance.    Height: '5\' 11"'$  (180.3 cm) Weight: 99.5 kg (219 lb 5.7 oz) IBW/kg (Calculated) : 75.3  Temp (24hrs), Avg:99.1 F (37.3 C), Min:98 F (36.7 C), Max:100.3 F (37.9 C)  Recent Labs  Lab 06/09/22 2110 06/10/22 0230 06/10/22 0428 06/11/22 0311  WBC 7.7 8.2  --  10.3  CREATININE 1.01 1.00  --  0.76  LATICACIDVEN  --  1.1 1.5  --     Estimated Creatinine Clearance: 88.5 mL/min (by C-G formula based on SCr of 0.76 mg/dL).    Allergies  Allergen Reactions   Ceclor [Cefaclor] Other (See Comments)    "Makes my heart slow and I feel like I am fading away"   Veralipride Other (See Comments)    Gums bleed, tooth aches   Penicillins Rash and Other (See Comments)     Has patient had a PCN reaction causing immediate rash, facial/tongue/throat swelling, SOB or lightheadedness with hypotension: yes- only rash Has patient had a PCN reaction causing severe rash involving mucus membranes or skin necrosis: No Has patient had a PCN reaction that required hospitalization: No Has patient had a PCN reaction occurring within the last 10 years: No If all of the above answers are "NO", then may proceed with Cephalosporin use.   Amlodipine Other (See Comments)    Gum bleeding   Antihistamines, Chlorpheniramine-Type Other (See Comments)    Dizziness and nose bleeds.  "Spaced out"    Beta Adrenergic Blockers Other (See Comments)    Gums bleed   Celebrex [Celecoxib] Other (See Comments)    Stomach pain   Ibuprofen Other (See Comments)    Stomach pain     Lipitor [Atorvastatin] Hypertension   Nutrasweet Aspartame [Aspartame] Other (See Comments)    Memory loss. "Spaced out"     Alayla Dethlefs S. Alford Highland, PharmD, BCPS Clinical Staff Pharmacist [email or web address]  Wayland Salinas 06/11/2022 11:39 AM

## 2022-06-11 NOTE — Progress Notes (Addendum)
       CROSS COVER NOTE  Name: Steven Simmons  DOB: [birth year]  QMG:[id number]  DOA: 06/09/2022  Level of care: Stepdown    Date of Service   06/11/2022     HPI/Events of Note   Nursing staff concerned the patient may have aspirated while receiving oral fluids.    During this event, patient became tachycardic and hypoxic for short period of time.  Does not appear to be in respiratory distress at this time.  Alert and oriented to self only.  Bilateral lungs diminished.   Interventions/ Plan   Chest x-ray N.p.o. Aspiration precautions        Raenette Rover, DNP, [place]

## 2022-06-11 NOTE — Progress Notes (Signed)
PROGRESS NOTE    Steven Simmons  [id number] DOB: [birth year] DOA: 06/09/2022 PCP: [name], MD   Brief Narrative:  The patient is an 82 year old elderly Caucasian male with a past medical history significant for but #2 allergies, osteoarthritis, aspiration pneumonia, peptic ulcer disease, history of upper GI bleed and history of lower GI bleed, cataracts, GERD, history of heart murmur as a child, history of TIA, hypertension, migraine headaches, history of sepsis due to UTI as well as recent pneumonia presented to the ED from EMS via [facility] for frontal mild headache and had not responded to treatment.  He was disoriented unable to provide a meaningful HPI but was able to answer simple questions.  Further workup revealed that he had a temperature 99.3 a pulse of 109, respirations of 20 and significantly elevated blood pressure 206/100.  He received acetaminophen, diltiazem, hydralazine, Benadryl, metoclopramide, lorazepam, 3 L of lactated Ringer boluses as well as vancomycin and metronidazole as well as cefepime.  Laboratory data showed that he had a stable white count but there is concern for urinary tract infection.  Head CT without contrast was done and showed atrophy and chronic microvascular disease but no acute intracranial abnormality.  Admitted for further workup of a urinary tract infection and aspiration   Assessment and Plan: No notes have been filed under this hospital service. Service: Hospitalist  Acute cystitis and E. coli UTI -Admitted to SDU/inpatient and improved so transitioned to Floor  -Urinalysis showed a hazy appearance with small hemoglobin, large leukocytes, negative nitrates, few bacteria, greater than 50 WBCs and urine culture growing greater than 100,000 colony-forming units of E. coli which sensitivities are still pending -Continue IV fluids with NS at 75 mL/hr -Continue cefepime 2 g every 8 hours.   -Follow-up urine culture and sensitivity. -Follow-up  blood culture and sensitivity -Follow CBC and CMP in a.m.  Aspiration into airway  -Negative chest chest radiograph. -No changes seen oxygen saturation. -SLP consult and they recommending resuming a regular diet with thin liquids and try meds with whole pure have recommended no follow-up needs identified -DG Chest X-Ray done and showed "Cardiac shadow is enlarged but stable. Aortic calcifications are noted. The lungs are well aerated bilaterally. No focal infiltrate or effusion is seen. No bony abnormality is noted. Slight increase in central vascular congestion is noted without edema."    Hyponatremia -Continue IV fluids. -Na+ Trend: Recent Labs  Lab 05/20/22 1221 05/29/22 1730 06/09/22 2110 06/10/22 0230 06/11/22 0311  NA 136 130* 128* 126* 127*  -Check urine sodium.and Check urine osmolality. -Follow-up sodium level.    HTN (Hypertension) -Blood pressures have been soft. -Continuing to Hold Diltiazem and, lisinopril and HCTZ for now -Continue to Monitor BP per Protocol -Last BP reading was    Paroxysmal Atrial Fibrillation (HCC) -CHA2DS2-VASc Score of at least 7. -Not on anticoagulation. -Was Holding Cardizem due to soft BP. -Continue with metoprolol tartrate 5 mg IV Q4 as needed for sustained heart rate greater than 130 for 3 doses   HLD (Hyperlipidemia) -No therapy currently listed. -Follow-up with primary.   Mild Protein Malnutrition ([place]) -C/w Protein supplementation. -Obtain dietitian and nutritionist evaluation  Pre-Diabetes -Carbohydrate modified diet. -Continue with moderate NovoLog sliding scale insulin before meals and CBG monitoring before meals and bedtime. -CBG's ranging from 108-129  Hyponatremia -Na+ Trend: Recent Labs  Lab 05/20/22 1221 05/29/22 1730 06/09/22 2110 06/10/22 0230 06/11/22 0311  NA 136 130* 128* 126* 127*  -Continue to Monitor and Trend and repeat CMP in the  AM   GERD (gastroesophageal reflux disease)/GI  Prophylaxis -Continued pantoprazole 40 mg p.o. daily but will change to IV 40 mg Daily  -Patient requesting Carafate that this has been added back and also requesting Zantac but no changes to famotidine twice daily  Hypokalemia -Patient's K+ Level Trend: Recent Labs  Lab 05/20/22 1221 05/29/22 1730 06/09/22 2110 06/10/22 0230 06/11/22 0311  K 3.9 5.1 3.4* 3.3* 3.0*  -Replete with IV Kcl 50 mEQ this AM -Continue to Monitor and Replete as Necessary -Repeat CMP in the AM   Hypomagnesemia, improved  -Patient's Mag Level Trend: Recent Labs  Lab 06/10/22 0428 06/11/22 0311  MG 1.5* 2.1  -Continue to Monitor and Replete as Necessary -Repeat Mag in the AM   Normocytic Anemia -Hgb/Hct trend: Recent Labs  Lab 05/20/22 1221 05/29/22 1730 06/09/22 2110 06/10/22 0230 06/11/22 0311  HGB 13.3 13.3 13.3 13.2 11.5*  HCT 42.1 41.6 40.6 39.9 35.1*  MCV 87.0 86.1 85.1 84.5 85.6  -Check Anemia Panel in the AM -Continue to Monitor for S/Sx of Bleeding; No overt bleeding noted -Repeat CBC in the AM   Hypoalbuminemia -Patient's Albumin Trend: Recent Labs  Lab 05/20/22 1221 05/29/22 1730 06/09/22 2110 06/10/22 0230 06/11/22 0311  ALBUMIN 3.9 3.7 3.3* 3.4* 2.8*  -Continue to Monitor and Trend and repeat CMP in the AM  Obesity -Complicates overall prognosis and care -Estimated body mass index is 30.59 kg/m as calculated from the following:   Height as of this encounter: '5\' 11"'$  (1.803 m).   Weight as of this encounter: 99.5 kg.  -Weight Loss and Dietary Counseling given  DVT prophylaxis: SCDs Start: 06/10/22 0411    Code Status: DNR Family Communication: No family currently at bedside  Disposition Plan:  Level of care: Stepdown Status is: Inpatient Remains inpatient appropriate because: Needs further clinical improvement and evaluation by PT OT   Consultants:  None  Procedures:  As delineated as above  Antimicrobials:  Anti-infectives (From admission, onward)     Start     Dose/Rate Route Frequency Ordered Stop   06/10/22 2200  vancomycin (VANCOREADY) IVPB 1500 mg/300 mL  Status:  Discontinued        1,500 mg 150 mL/hr over 120 Minutes Intravenous Every 24 hours 06/10/22 0321 06/10/22 0412   06/10/22 1000  ceFEPIme (MAXIPIME) 2 g in sodium chloride 0.9 % 100 mL IVPB        2 g 200 mL/hr over 30 Minutes Intravenous Every 8 hours 06/10/22 0321     06/10/22 0230  ceFEPIme (MAXIPIME) 2 g in sodium chloride 0.9 % 100 mL IVPB        2 g 200 mL/hr over 30 Minutes Intravenous  Once 06/10/22 0225 06/10/22 0300   06/10/22 0230  metroNIDAZOLE (FLAGYL) IVPB 500 mg        500 mg 100 mL/hr over 60 Minutes Intravenous  Once 06/10/22 0225 06/10/22 0349   06/10/22 0230  vancomycin (VANCOCIN) IVPB 1000 mg/200 mL premix        1,000 mg 200 mL/hr over 60 Minutes Intravenous  Once 06/10/22 0225 06/10/22 0358   06/09/22 2215  cefTRIAXone (ROCEPHIN) 1 g in sodium chloride 0.9 % 100 mL IVPB        1 g 200 mL/hr over 30 Minutes Intravenous  Once 06/09/22 2213 06/09/22 2330   06/09/22 0000  cephALEXin (KEFLEX) 500 MG capsule        500 mg Oral 3 times daily 06/09/22 2228  Subjective: Seen and examined at bedside and is complaining some abdominal discomfort and burning in his urine.  No nausea or vomiting.  Denied chest pain or shortness of breath.  Felt okay.  No other concerns or complaints at this time.  Objective: Vitals:   06/11/22 0440 06/11/22 0500 06/11/22 0600 06/11/22 0700  BP:  (!) 163/96 (!) 163/67 (!) 176/120  Pulse:  95 90 100  Resp:  (!) 29 20 (!) 31  Temp: 99.6 F (37.6 C)     TempSrc: Oral     SpO2:  97% 96% 96%  Weight:  99.5 kg    Height:        Intake/Output Summary (Last 24 hours) at 06/11/2022 0825 Last data filed at 06/11/2022 0800 Gross per 24 hour  Intake 1746.13 ml  Output 1300 ml  Net 446.13 ml   Filed Weights   06/10/22 0515 06/11/22 0500  Weight: 92.2 kg 99.5 kg   Examination: Physical Exam:  Constitutional: WN/WD  obese Caucasian male in no acute distress appears uncomfortable Respiratory: Diminished to auscultation bilaterally, no wheezing, rales, rhonchi or crackles. Normal respiratory effort and patient is not tachypenic. No accessory muscle use.  Unlabored breathing Cardiovascular: Tachycardic rate but regular rhythm, no murmurs / rubs / gallops. S1 and S2 auscultated.  Mild lower extremity edema Abdomen: Soft, tender to palpate, distended secondary to body habitus. Bowel sounds positive.  GU: Deferred. Musculoskeletal: No clubbing / cyanosis of digits/nails. No joint deformity upper and lower extremities.  Skin: No rashes, lesions, ulcers limited skin evaluation. No induration; Warm and dry.  Neurologic: CN 2-12 grossly intact with no focal deficits. Romberg sign and cerebellar reflexes not assessed.  Psychiatric: Appears a little confused but is awake and alert  Data Reviewed: I have personally reviewed following labs and imaging studies  CBC: Recent Labs  Lab 06/09/22 2110 06/10/22 0230 06/11/22 0311  WBC 7.7 8.2 10.3  NEUTROABS 6.7 7.0 8.6*  HGB 13.3 13.2 11.5*  HCT 40.6 39.9 35.1*  MCV 85.1 84.5 85.6  PLT 254 262 [id number]   Basic Metabolic Panel: Recent Labs  Lab 06/09/22 2110 06/10/22 0230 06/10/22 0428 06/11/22 0311  NA 128* 126*  --  127*  K 3.4* 3.3*  --  3.0*  CL 90* 93*  --  95*  CO2 28 24  --  24  GLUCOSE 123* 144*  --  112*  BUN 22 20  --  16  CREATININE 1.01 1.00  --  0.76  CALCIUM 8.7* 8.5*  --  7.9*  MG  --   --  1.5* 2.1   GFR: Estimated Creatinine Clearance: 88.5 mL/min (by C-G formula based on SCr of 0.76 mg/dL). Liver Function Tests: Recent Labs  Lab 06/09/22 2110 06/10/22 0230 06/11/22 0311  AST '24 25 29  '$ ALT '15 16 19  '$ ALKPHOS 71 73 65  BILITOT 0.7 0.9 0.9  PROT 7.3 7.3 6.4*  ALBUMIN 3.3* 3.4* 2.8*   No results for input(s): "LIPASE", "AMYLASE" in the last 168 hours. No results for input(s): "AMMONIA" in the last 168 hours. Coagulation  Profile: Recent Labs  Lab 06/10/22 0230  INR 1.1   Cardiac Enzymes: No results for input(s): "CKTOTAL", "CKMB", "CKMBINDEX", "TROPONINI" in the last 168 hours. BNP (last 3 results) No results for input(s): "PROBNP" in the last 8760 hours. HbA1C: No results for input(s): "HGBA1C" in the last 72 hours. CBG: Recent Labs  Lab 06/10/22 1831 06/10/22 2224 06/11/22 0054 06/11/22 0431 06/11/22 0809  GLUCAP 136* 130* 118*  109* 108*   Lipid Profile: No results for input(s): "CHOL", "HDL", "LDLCALC", "TRIG", "CHOLHDL", "LDLDIRECT" in the last 72 hours. Thyroid Function Tests: No results for input(s): "TSH", "T4TOTAL", "FREET4", "T3FREE", "THYROIDAB" in the last 72 hours. Anemia Panel: No results for input(s): "VITAMINB12", "FOLATE", "FERRITIN", "TIBC", "IRON", "RETICCTPCT" in the last 72 hours. Sepsis Labs: Recent Labs  Lab 06/10/22 0230 06/10/22 0428  PROCALCITON  --  0.33  LATICACIDVEN 1.1 1.5    Recent Results (from the past 240 hour(s))  Blood culture (routine x 2)     Status: None (Preliminary result)   Collection Time: 06/10/22  2:30 AM   Specimen: BLOOD  Result Value Ref Range Status   Specimen Description   Final    BLOOD BLOOD LEFT FOREARM Performed at [facility] [address], [place], [place] [address]    Special Requests   Final    BOTTLES DRAWN AEROBIC AND ANAEROBIC Blood Culture adequate volume Performed at [facility] [address], [place], [place] [address]    Culture   Final    NO GROWTH 1 DAY Performed at [facility], [place] [address], [place], [place] [address]    Report Status PENDING  Incomplete  Resp panel by RT-PCR (RSV, Flu A&B, Covid) Anterior Nasal Swab     Status: None   Collection Time: 06/10/22  2:44 AM   Specimen: Anterior Nasal Swab  Result Value Ref Range Status   SARS Coronavirus 2 by RT PCR NEGATIVE NEGATIVE Final    Comment: (NOTE) SARS-CoV-2 target nucleic acids are NOT  DETECTED.  The SARS-CoV-2 RNA is generally detectable in upper respiratory specimens during the acute phase of infection. The lowest concentration of SARS-CoV-2 viral copies this assay can detect is 138 copies/mL. A negative result does not preclude SARS-Cov-2 infection and should not be used as the sole basis for treatment or other patient management decisions. A negative result may occur with  improper specimen collection/handling, submission of specimen other than nasopharyngeal swab, presence of viral mutation(s) within the areas targeted by this assay, and inadequate number of viral copies(<138 copies/mL). A negative result must be combined with clinical observations, patient history, and epidemiological information. The expected result is Negative.  Fact Sheet for Patients:  EntrepreneurPulse.com.au  Fact Sheet for Healthcare Providers:  IncredibleEmployment.be  This test is no t yet approved or cleared by the [place] FDA and  has been authorized for detection and/or diagnosis of SARS-CoV-2 by FDA under an Emergency Use Authorization (EUA). This EUA will remain  in effect (meaning this test can be used) for the duration of the COVID-19 declaration under Section 564(b)(1) of the Act, 21 U.S.C.section 360bbb-3(b)(1), unless the authorization is terminated  or revoked sooner.       Influenza A by PCR NEGATIVE NEGATIVE Final   Influenza B by PCR NEGATIVE NEGATIVE Final    Comment: (NOTE) The Xpert Xpress SARS-CoV-2/FLU/RSV plus assay is intended as an aid in the diagnosis of influenza from Nasopharyngeal swab specimens and should not be used as a sole basis for treatment. Nasal washings and aspirates are unacceptable for Xpert Xpress SARS-CoV-2/FLU/RSV testing.  Fact Sheet for Patients: EntrepreneurPulse.com.au  Fact Sheet for Healthcare Providers: IncredibleEmployment.be  This test is not yet  approved or cleared by the [place] FDA and has been authorized for detection and/or diagnosis of SARS-CoV-2 by FDA under an Emergency Use Authorization (EUA). This EUA will remain in effect (meaning this test can be used) for the duration  of the COVID-19 declaration under Section 564(b)(1) of the Act, 21 U.S.C. section 360bbb-3(b)(1), unless the authorization is terminated or revoked.     Resp Syncytial Virus by PCR NEGATIVE NEGATIVE Final    Comment: (NOTE) Fact Sheet for Patients: EntrepreneurPulse.com.au  Fact Sheet for Healthcare Providers: IncredibleEmployment.be  This test is not yet approved or cleared by the [place] FDA and has been authorized for detection and/or diagnosis of SARS-CoV-2 by FDA under an Emergency Use Authorization (EUA). This EUA will remain in effect (meaning this test can be used) for the duration of the COVID-19 declaration under Section 564(b)(1) of the Act, 21 U.S.C. section 360bbb-3(b)(1), unless the authorization is terminated or revoked.  Performed at [facility], [place] [address] [place], [place] [address]   Blood culture (routine x 2)     Status: None (Preliminary result)   Collection Time: 06/10/22  3:13 AM   Specimen: BLOOD RIGHT WRIST  Result Value Ref Range Status   Specimen Description   Final    BLOOD RIGHT WRIST Performed at [facility] [address] [place], [place] [address]    Special Requests   Final    BOTTLES DRAWN AEROBIC AND ANAEROBIC Blood Culture adequate volume Performed at [facility] [address] [place], [place] [address]    Culture   Final    NO GROWTH 1 DAY Performed at [facility], [place] [address], [place], [place] [address]    Report Status PENDING  Incomplete  MRSA Next Gen by PCR, Nasal     Status: None   Collection Time: 06/10/22  5:35 AM   Specimen: Nasal Mucosa; Nasal Swab  Result Value  Ref Range Status   MRSA by PCR Next Gen NOT DETECTED NOT DETECTED Final    Comment: (NOTE) The GeneXpert MRSA Assay (FDA approved for NASAL specimens only), is one component of a comprehensive MRSA colonization surveillance program. It is not intended to diagnose MRSA infection nor to guide or monitor treatment for MRSA infections. Test performance is not FDA approved in patients less than 70 years old. Performed at [facility], [place] [address], [place], [place] [address]      Radiology Studies: [facility] Chest Port 1 View  Result Date: 06/10/2022 CLINICAL DATA:  Recent aspiration EXAM: PORTABLE CHEST 1 VIEW COMPARISON:  Film from earlier in the same day. FINDINGS: Cardiac shadow is enlarged but stable. Aortic calcifications are noted. The lungs are well aerated bilaterally. No focal infiltrate or effusion is seen. No bony abnormality is noted. Slight increase in central vascular congestion is noted without edema. IMPRESSION: Mild vascular congestion.  No other focal abnormality is noted. Electronically Signed   By: Inez Catalina M.D.   On: 06/10/2022 21:59   DG CHEST PORT 1 VIEW  Result Date: 06/10/2022 CLINICAL DATA:  Aspiration. EXAM: PORTABLE CHEST 1 VIEW COMPARISON:  Chest x-ray from yesterday. FINDINGS: Unchanged mild cardiomegaly. No focal consolidation, pleural effusion, or pneumothorax. No acute osseous abnormality. IMPRESSION: No active disease. Electronically Signed   By: Titus Dubin M.D.   On: 06/10/2022 09:40   DG Chest Port 1 View  Result Date: 06/09/2022 CLINICAL DATA:  Nausea EXAM: PORTABLE CHEST 1 VIEW COMPARISON:  05/29/2022 FINDINGS: Cardiac shadow is within normal limits. Aortic calcifications are again seen. Persistent left retrocardiac density is noted without significant increase. No new focal infiltrate is noted. No bony abnormality is seen. IMPRESSION: Stable left retrocardiac opacity. Electronically Signed   By: Elta Guadeloupe  Lukens M.D.   On: 06/09/2022 21:28    CT HEAD WO CONTRAST (5MM)  Result Date: 06/09/2022 CLINICAL DATA:  Headache EXAM: CT HEAD WITHOUT CONTRAST TECHNIQUE: Contiguous axial images were obtained from the base of the skull through the vertex without intravenous contrast. RADIATION DOSE REDUCTION: This exam was performed according to the departmental dose-optimization program which includes automated exposure control, adjustment of the mA and/or kV according to patient size and/or use of iterative reconstruction technique. COMPARISON:  07/28/2015 FINDINGS: Brain: There is atrophy and chronic small vessel disease changes. No acute intracranial abnormality. Specifically, no hemorrhage, hydrocephalus, mass lesion, acute infarction, or significant intracranial injury. Vascular: No hyperdense vessel or unexpected calcification. Skull: No acute calvarial abnormality. Sinuses/Orbits: No acute findings Other: None IMPRESSION: Atrophy, chronic microvascular disease. No acute intracranial abnormality. Electronically Signed   By: Rolm Baptise M.D.   On: 06/09/2022 21:11     Scheduled Meds:  Chlorhexidine Gluconate Cloth  6 each Topical Daily   insulin aspart  0-15 Units Subcutaneous TID WC   mouth rinse  15 mL Mouth Rinse 4 times per day   pantoprazole  40 mg Oral Daily   Continuous Infusions:  ceFEPime (MAXIPIME) IV Stopped (06/11/22 0142)   potassium chloride Stopped (06/11/22 0756)    LOS: 1 day   Raiford Noble, DO [facility] Available via Epic secure chat 7am-7pm After these hours, please refer to coverage provider listed on [email or web address] 06/11/2022, 8:25 AM

## 2022-06-11 NOTE — TOC Initial Note (Addendum)
Transition of Care [facility] - Initial/Assessment Note    Patient Details  Name: Steven Simmons MRN: [id number] Date of Birth: [birth year]  Transition of Care [facility]) CM/SW Contact:    Roseanne Kaufman, RN Phone Number: 06/11/2022, 1:32 PM  Clinical Narrative:      Per chart review patient from [facility] and A&O self only, no additional contacts on file.  This RNCM spoke with Sharyn Lull with [facility] who reports patient is long term care resident and can return when medically stable.    TOC will continue to follow for needs.             Expected Discharge Plan: Long Term Nursing Home Barriers to Discharge: Continued Medical Work up   Patient Goals and CMS Choice Patient states their goals for this hospitalization and ongoing recovery are:: return to [facility]          Expected Discharge Plan and Services In-house Referral: NA Discharge Planning Services: CM Consult Post Acute Care Choice: NA Living arrangements for the past 2 months: [place]                 DME Arranged: N/A DME Agency: NA       HH Arranged: NA [place] Agency: NA        Prior Living Arrangements/Services Living arrangements for the past 2 months: [place] Lives with:: Facility Resident Patient language and need for interpreter reviewed:: Yes Do you feel safe going back to the place where you live?: Yes      Need for Family Participation in Patient Care: No (Comment) Care giver support system in place?: No (comment) Current home services: Other (comment) (none) Criminal Activity/Legal Involvement Pertinent to Current Situation/Hospitalization: No - Comment as needed  Activities of Daily Living Home Assistive Devices/Equipment: Cane (specify quad or straight) ADL Screening (condition at time of admission) Patient's cognitive ability adequate to safely complete daily activities?: No Is the patient deaf or have difficulty hearing?: Yes Does the patient have difficulty  seeing, even when wearing glasses/contacts?: No Does the patient have difficulty concentrating, remembering, or making decisions?: Yes Patient able to express need for assistance with ADLs?: Yes Does the patient have difficulty dressing or bathing?: Yes Independently performs ADLs?: No Communication: Independent Dressing (OT): Independent Grooming: Independent Feeding: Independent Bathing: Needs assistance Is this a change from baseline?: Pre-admission baseline Toileting: Needs assistance Is this a change from baseline?: Pre-admission baseline In/Out Bed: Independent Walks in Home: Independent Does the patient have difficulty walking or climbing stairs?: Yes Weakness of Legs: Both Weakness of Arms/Hands: Both  Permission Sought/Granted Permission sought to share information with : Case Manager Permission granted to share information with : Yes, Verbal Permission Granted  Share Information with NAME: Case Manager           Emotional Assessment Appearance:: Appears stated age Attitude/Demeanor/Rapport: Unable to Assess Affect (typically observed): Unable to Assess Orientation: : Oriented to Self Alcohol / Substance Use: Tobacco Use (quit smoking 45 years ago) Psych Involvement: No (comment)  Admission diagnosis:  Acute cystitis [N30.00] Hyponatremia [E87.1] Urinary tract infection without hematuria, site unspecified [N39.0] Nonintractable headache, unspecified chronicity pattern, unspecified headache type [R51.9] Hypertension, unspecified type [I10] Sepsis with encephalopathy without septic shock, due to unspecified organism ([place]) [A41.9, R65.20, G93.41] Patient Active Problem List   Diagnosis Date Noted   Acute cystitis 06/10/2022   Hypomagnesemia 06/10/2022   Mild protein malnutrition ([place]) 06/10/2022   Aspiration into airway 06/10/2022   COPD with acute exacerbation (  [place]) 03/12/2022   Elevated brain natriuretic peptide (BNP) level 03/11/2022   Generalized weakness  03/11/2022   Housing instability 12/08/2021   Homelessness 12/08/2021   Financial insecurity 12/08/2021   Paroxysmal atrial fibrillation ([place]) 12/02/2021   Dyspnea on exertion 12/02/2021   Normocytic anemia 12/02/2021   Dyspnea 12/02/2021   Acute blood loss anemia    BRBPR (bright red blood per rectum) 10/01/2021   Atrial fibrillation with RVR ([place]) 10/01/2021   Prediabetes 11/16/2018   CAP (community acquired pneumonia) 08/23/2017   Appetite impaired 08/23/2017   Singultus, recent history 08/23/2017   Enlarged prostate on Pelvic CT 08/23/2017   Hyponatremia 08/22/2017   GERD (gastroesophageal reflux disease) 08/22/2017   Hypokalemia 09/24/2016   Erectile dysfunction 12/28/2013   HLD (hyperlipidemia) 08/11/2012   HTN (hypertension) 06/29/2012   PCP:  [name], MD Pharmacy:   [place], [place] - [address] [place] [address] Phone: [phone] Fax: [phone]     Social Determinants of Health (SDOH) Social History: SDOH Screenings   Food Insecurity: No Food Insecurity (03/12/2022)  Housing: Low Risk  (03/12/2022)  Transportation Needs: Unmet Transportation Needs (03/12/2022)  Utilities: At Risk (03/12/2022)  Depression (PHQ2-9): Low Risk  (01/18/2022)  Tobacco Use: Medium Risk (06/10/2022)   SDOH Interventions:     Readmission Risk Interventions    06/11/2022    1:17 PM  Readmission Risk Prevention Plan  Transportation Screening Complete  PCP or Specialist Appt within 3-5 Days Complete  HRI or [place] Complete  Social Work Consult for [place] Planning/Counseling Complete  Medication Review Press photographer) Complete

## 2022-06-11 NOTE — Evaluation (Signed)
Clinical/Bedside Swallow Evaluation Patient Details  Name: Steven Simmons MRN: [id number] Date of Birth: October 17, 1940  Today's Date: 06/11/2022 Time: SLP Start Time (ACUTE ONLY): [id number] SLP Stop Time (ACUTE ONLY): 0904 SLP Time Calculation (min) (ACUTE ONLY): 14 min  Past Medical History:  Past Medical History:  Diagnosis Date   Allergy    Arthritis    shoulders (08/23/2017)   Aspiration pneumonia ([place]) 08/23/2017   Bleeding stomach ulcer 1988   Cataract    Complication of anesthesia 1993   " dont' know the name of it but they gave it to me at [facility] in 1993 "   I was cold, shaking and had a headache for hours afterwards "   GERD (gastroesophageal reflux disease)    Heart murmur    "when I was a child"   History of blood transfusion 1988   "bleeding ulcer"     History of TIA (transient ischemic attack) 07/2015   pt denies this hx on 08/23/2017   Hypertension    Migraine 1959-1980   "did have 4 ~ 2 wks ago; just the aura then" (08/23/2017)   Peptic ulcer disease    Sepsis secondary to UTI ([place]) 08/22/2017   Past Surgical History:  Past Surgical History:  Procedure Laterality Date   CATARACT EXTRACTION W/ INTRAOCULAR LENS  IMPLANT, BILATERAL Bilateral    COLONOSCOPY  2012 ?   EXCISIONAL HEMORRHOIDECTOMY  1993   LAPAROSCOPIC CHOLECYSTECTOMY     HPI:  Steven Simmons is an 82 y.o. male who presented to the emergency department via EMS from [facility] with a frontal migraine headache that had not responded to treatment. Dx acute cystitis, possible aspiration, hypokalemia, hyponatremia. PMHx allergies, osteoarthritis, aspiration pneumonia, PUD, history of UGI bleed, history of lower GI bleed, cataracts, GERD, TIA, hypertension, migraine headaches, history of sepsis due to UTI, recent pneumonia.    Assessment / Plan / Recommendation  Clinical Impression  Pt participated in clinical swallowing assessment. He was alert, talkative, following commands, making jokes, mildly confused. Oral  mechanism exam unremarkable; missing most teeth except lower central incisors, which are in poor condition.  No focal CN deficits. Pt accepted ice chips, water, applesauce and crackers, feeding himself without difficulty. His swallow function appeared to be quite normal, with mildly prolonged mastication due to absence of teeth, but otherwise no s/s of dysphagia. He drank sequential swallows of thin water with no s/s of aspiration.  He was taxed with mixed consistencies of thin liquids and regular solids, demonstrating ability to protect airway over multiple trials.  There was no evidence of dysphagia.  Yesterday's incident - observed to be coughing with liquids - was not reproduceable today and may have been related to MS.  Recommend resuming a regular diet, thin liquids; try meds whole with puree.  No f/u needs identified - SLP will sign off. SLP Visit Diagnosis: Dysphagia, unspecified (R13.10)    Aspiration Risk  No limitations    Diet Recommendation   Regular solids, thin liquids  Medication Administration: Whole meds with liquid    Other  Recommendations Oral Care Recommendations: Oral care BID    Recommendations for follow up therapy are one component of a multi-disciplinary discharge planning process, led by the attending physician.  Recommendations may be updated based on patient status, additional functional criteria and insurance authorization.  Follow up Recommendations No SLP follow up        [place] Date of Onset: 06/10/22 HPI: Steven Simmons is an 82 y.o. male  who presented to the emergency department via EMS from [facility] with a frontal migraine headache that had not responded to treatment. Dx acute cystitis, possible aspiration, hypokalemia, hyponatremia. PMHx allergies, osteoarthritis, aspiration pneumonia, PUD, history of UGI bleed, history of lower GI bleed, cataracts, GERD, TIA, hypertension, migraine headaches, history of sepsis due to UTI, recent  pneumonia. Type of Study: Bedside Swallow Evaluation Previous Swallow Assessment: none - many swallow screens in the past, but no SLP swallow evaluations Diet Prior to this Study: NPO Temperature Spikes Noted: No Respiratory Status: Nasal cannula History of Recent Intubation: No Behavior/Cognition: Alert;Cooperative;Pleasant mood Oral Cavity Assessment: Within Functional Limits Oral Care Completed by SLP: No Oral Cavity - Dentition: Poor condition;Missing dentition Vision: Functional for self-feeding Self-Feeding Abilities: Able to feed self Patient Positioning: Upright in bed Baseline Vocal Quality: Normal Volitional Cough: Strong Volitional Swallow: Able to elicit    Oral/Motor/Sensory Function Overall Oral Motor/Sensory Function: Within functional limits   Ice Chips Ice chips: Within functional limits   Thin Liquid Thin Liquid: Within functional limits    Nectar Thick Nectar Thick Liquid: Not tested   Honey Thick Honey Thick Liquid: Not tested   Puree Puree: Within functional limits   Solid     Solid: Within functional limits      Juan Quam Laurice 06/11/2022,9:14 AM  Estill Bamberg L. Tivis Ringer, MA CCC/SLP Clinical Specialist - [place] Office number [phone]

## 2022-06-12 ENCOUNTER — Inpatient Hospital Stay (HOSPITAL_COMMUNITY): Payer: Medicare PPO

## 2022-06-12 LAB — CBC WITH DIFFERENTIAL/PLATELET
Abs Immature Granulocytes: 0.06 K/uL (ref 0.00–0.07)
Basophils Absolute: 0 K/uL (ref 0.0–0.1)
Basophils Relative: 0 %
Eosinophils Absolute: 0.1 K/uL (ref 0.0–0.5)
Eosinophils Relative: 1 %
HCT: 34.4 % — ABNORMAL LOW (ref 39.0–52.0)
Hemoglobin: 11.3 g/dL — ABNORMAL LOW (ref 13.0–17.0)
Immature Granulocytes: 1 %
Lymphocytes Relative: 7 %
Lymphs Abs: 0.6 K/uL — ABNORMAL LOW (ref 0.7–4.0)
MCH: 27.9 pg (ref 26.0–34.0)
MCHC: 32.8 g/dL (ref 30.0–36.0)
MCV: 84.9 fL (ref 80.0–100.0)
Monocytes Absolute: 0.7 K/uL (ref 0.1–1.0)
Monocytes Relative: 9 %
Neutro Abs: 6.3 K/uL (ref 1.7–7.7)
Neutrophils Relative %: 82 %
Platelets: 205 K/uL (ref 150–400)
RBC: 4.05 MIL/uL — ABNORMAL LOW (ref 4.22–5.81)
RDW: 15.7 % — ABNORMAL HIGH (ref 11.5–15.5)
WBC: 7.7 K/uL (ref 4.0–10.5)
nRBC: 0 % (ref 0.0–0.2)

## 2022-06-12 LAB — COMPREHENSIVE METABOLIC PANEL
ALT: 24 U/L (ref 0–44)
AST: 33 U/L (ref 15–41)
Albumin: 2.7 g/dL — ABNORMAL LOW (ref 3.5–5.0)
Alkaline Phosphatase: 74 U/L (ref 38–126)
Anion gap: 8 (ref 5–15)
BUN: 22 mg/dL (ref 8–23)
CO2: 23 mmol/L (ref 22–32)
Calcium: 8.2 mg/dL — ABNORMAL LOW (ref 8.9–10.3)
Chloride: 94 mmol/L — ABNORMAL LOW (ref 98–111)
Creatinine, Ser: 0.86 mg/dL (ref 0.61–1.24)
GFR, Estimated: >60 mL/min (ref >60)
Glucose, Bld: 153 mg/dL — ABNORMAL HIGH (ref 70–99)
Potassium: 3.6 mmol/L (ref 3.5–5.1)
Sodium: 125 mmol/L — ABNORMAL LOW (ref 135–145)
Total Bilirubin: 1 mg/dL (ref 0.3–1.2)
Total Protein: 6 g/dL — ABNORMAL LOW (ref 6.5–8.1)

## 2022-06-12 LAB — HEMOGLOBIN A1C
Hgb A1c MFr Bld: 6.5 % — ABNORMAL HIGH (ref 4.8–5.6)
Mean Plasma Glucose: 140 mg/dL

## 2022-06-12 LAB — URINE CULTURE: Culture: >=100000 — AB

## 2022-06-12 LAB — GLUCOSE, CAPILLARY
Glucose-Capillary: 148 mg/dL — ABNORMAL HIGH (ref 70–99)
Glucose-Capillary: 149 mg/dL — ABNORMAL HIGH (ref 70–99)
Glucose-Capillary: 122 mg/dL — ABNORMAL HIGH (ref 70–99)
Glucose-Capillary: 134 mg/dL — ABNORMAL HIGH (ref 70–99)

## 2022-06-12 LAB — MAGNESIUM: Magnesium: 2.1 mg/dL (ref 1.7–2.4)

## 2022-06-12 LAB — PHOSPHORUS: Phosphorus: 2.3 mg/dL — ABNORMAL LOW (ref 2.5–4.6)

## 2022-06-12 MED ORDER — K PHOS MONO-SOD PHOS DI & MONO 155-852-130 MG PO TABS
500.0000 mg | ORAL_TABLET | Freq: Once | ORAL | Status: AC
  Administered 2022-06-12: 500 mg via ORAL
  Filled 2022-06-12 (×2): qty 2

## 2022-06-12 MED ORDER — MELATONIN 3 MG PO TABS: 6.0000 mg | ORAL_TABLET | Freq: Every evening | ORAL | Status: DC | PRN

## 2022-06-12 MED ORDER — CEFAZOLIN SODIUM-DEXTROSE 1-4 GM/50ML-% IV SOLN
1.0000 g | Freq: Three times a day (TID) | INTRAVENOUS | Status: DC
  Administered 2022-06-12 – 2022-06-14 (×6): 1 g via INTRAVENOUS
  Filled 2022-06-12 (×9): qty 50

## 2022-06-12 NOTE — Progress Notes (Signed)
PROGRESS NOTE    Steven Simmons  [id number] DOB: [birth year] DOA: 06/09/2022 PCP: [name], MD   Brief Narrative:  The patient is an 82 year old elderly Caucasian male with a past medical history significant for but #2 allergies, osteoarthritis, aspiration pneumonia, peptic ulcer disease, history of upper GI bleed and history of lower GI bleed, cataracts, GERD, history of heart murmur as a child, history of TIA, hypertension, migraine headaches, history of sepsis due to UTI as well as recent pneumonia presented to the ED from EMS via [facility] for frontal mild headache and had not responded to treatment.  He was disoriented unable to provide a meaningful HPI but was able to answer simple questions.  Further workup revealed that he had a temperature 99.3 a pulse of 109, respirations of 20 and significantly elevated blood pressure 206/100.  He received acetaminophen, diltiazem, hydralazine, Benadryl, metoclopramide, lorazepam, 3 L of lactated Ringer boluses as well as vancomycin and metronidazole as well as cefepime.   Laboratory data showed that he had a stable white count but there is concern for urinary tract infection.  Head CT without contrast was done and showed atrophy and chronic microvascular disease but no acute intracranial abnormality.  Admitted for further workup of a urinary tract infection and aspiration   **Interim History Urinary tract infection is being treated and antibiotics have been de-escalated.  Sodium still remains on the low side and nursing reports intermittent confusion however on my evaluation he appears awake and alert and oriented.  Will need PT OT to further evaluate and treat and this is pending. His Mentation is improved but nursing states he was still a little confused. Na+ remains on the lower side and will continue to Monitor closely  Assessment and Plan:  Acute cystitis and E. coli UTI -Admitted to SDU/inpatient and improved so transitioned to Floor   -Urinalysis showed a hazy appearance with small hemoglobin, large leukocytes, negative nitrates, few bacteria, greater than 50 WBCs and urine culture growing greater than 100,000 colony-forming units of E. coli which sensitivities were pansensitive and only resistant to ciprofloxacin so antibiotics will be de-escalated -Discontinued IV fluids with NS at 75 mL/hr -Continue cefepime 2 g every 8 hours but changed to IV cefazolin -Follow-up blood culture and sensitivity -Follow CBC and CMP in a.m.   Aspiration into airway  -Negative chest chest radiograph. -No changes seen oxygen saturation. -SLP consult and they recommending resuming a regular diet with thin liquids and try meds with whole pure have recommended no follow-up needs identified -Initial DG Chest X-Ray done and showed "Cardiac shadow is enlarged but stable. Aortic calcifications are noted. The lungs are well aerated bilaterally. No focal infiltrate or effusion is seen. No bony abnormality is noted. Slight increase in central vascular congestion is noted without edema." -Repeat CXR this AM done and showed "The cardiomediastinal silhouette is unchanged.  Elevation of RIGHT hemidiaphragm again noted. There is no evidence of focal airspace disease, pulmonary edema, suspicious pulmonary nodule/mass, pleural effusion, or pneumothorax. No acute bony abnormalities are identified." -Patient will need an amatory home O2 screen and repeat chest x-ray in a.m.  HTN (Hypertension) -Blood pressures have been soft. -Continuing to Hold Diltiazem and, lisinopril and HCTZ for now -Continue to Monitor BP per Protocol -Last BP reading was 145/72   Paroxysmal Atrial Fibrillation (HCC) -CHA2DS2-VASc Score of at least 7. -Not on anticoagulation. -Was Holding Cardizem due to soft BP. -Continue with metoprolol tartrate 5 mg IV Q4 as needed for sustained heart rate greater than  130 for 3 doses  Hypophosphatemia -Phos Level was 2.3 -Replete with po K  Phos 500 mg po x1 -Continue to Replete as Necessary and repeat Phos Level in the AM    HLD (Hyperlipidemia) -No therapy currently listed. -Follow-up with primary.   Mild Protein Malnutrition ([place]) -C/w Protein supplementation. -Obtain dietitian and nutritionist evaluation  Pre-Diabetes -Carbohydrate modified diet. -Continue with moderate NovoLog sliding scale insulin before meals and CBG monitoring before meals and bedtime. -CBG's ranging from 119-156   Hyponatremia -Na+ Trend: Recent Labs  Lab 05/20/22 1221 05/29/22 1730 06/09/22 2110 06/10/22 0230 06/11/22 0311 06/12/22 0541  NA 136 130* 128* 126* 127* 125*  -IVF now stopped; Urine Osm was 505 and Urine Sodium was 28 -Continue to Monitor and Trend and repeat CMP in the AM    GERD (gastroesophageal reflux disease)/GI Prophylaxis -Continued pantoprazole 40 mg p.o. daily but will change to IV 40 mg Daily  -Patient requesting Carafate that this has been added back and also requesting Zantac but no changes to famotidine twice daily   Hypokalemia -Patient's K+ Level Trend: Recent Labs  Lab 05/20/22 1221 05/29/22 1730 06/09/22 2110 06/10/22 0230 06/11/22 0311 06/12/22 0541  K 3.9 5.1 3.4* 3.3* 3.0* 3.6  -Replete with IV Kcl 50 mEQ this AM -Continue to Monitor and Replete as Necessary -Repeat CMP in the AM    Hypomagnesemia, improved  -Patient's Mag Level Trend: Recent Labs  Lab 06/10/22 0428 06/11/22 0311 06/12/22 0541  MG 1.5* 2.1 2.1  -Continue to Monitor and Replete as Necessary -Repeat Mag in the AM    Normocytic Anemia -Hgb/Hct trend: Recent Labs  Lab 05/20/22 1221 05/29/22 1730 06/09/22 2110 06/10/22 0230 06/11/22 0311 06/12/22 0541  HGB 13.3 13.3 13.3 13.2 11.5* 11.3*  HCT 42.1 41.6 40.6 39.9 35.1* 34.4*  MCV 87.0 86.1 85.1 84.5 85.6 84.9  -Check Anemia Panel in the AM -Continue to Monitor for S/Sx of Bleeding; No overt bleeding noted -Repeat CBC in the AM    Hypoalbuminemia -Patient's  Albumin Trend: Recent Labs  Lab 05/20/22 1221 05/29/22 1730 06/09/22 2110 06/10/22 0230 06/11/22 0311 06/12/22 0541  ALBUMIN 3.9 3.7 3.3* 3.4* 2.8* 2.7*  -Continue to Monitor and Trend and repeat CMP in the AM   Obesity -Complicates overall prognosis and care -Estimated body mass index is 30.59 kg/m as calculated from the following:   Height as of this encounter: '5\' 11"'$  (1.803 m).   Weight as of this encounter: 99.5 kg.  -Weight Loss and Dietary Counseling given   DVT prophylaxis: SCDs Start: 06/10/22 0411    Code Status: DNR Family Communication: No family present at bedside   Disposition Plan:  Level of care: Progressive Status is: Inpatient Remains inpatient appropriate because: Needs further clinical improvement and evaluation by PT/OT   Consultants:  None  Procedures:  As delineated as above  Antimicrobials:  Anti-infectives (From admission, onward)    Start     Dose/Rate Route Frequency Ordered Stop   06/12/22 1415  ceFAZolin (ANCEF) IVPB 1 g/50 mL premix        1 g 100 mL/hr over 30 Minutes Intravenous Every 8 hours 06/12/22 1317     06/10/22 2200  vancomycin (VANCOREADY) IVPB 1500 mg/300 mL  Status:  Discontinued        1,500 mg 150 mL/hr over 120 Minutes Intravenous Every 24 hours 06/10/22 0321 06/10/22 0412   06/10/22 1000  ceFEPIme (MAXIPIME) 2 g in sodium chloride 0.9 % 100 mL IVPB  Status:  Discontinued  2 g 200 mL/hr over 30 Minutes Intravenous Every 8 hours 06/10/22 0321 06/12/22 1317   06/10/22 0230  ceFEPIme (MAXIPIME) 2 g in sodium chloride 0.9 % 100 mL IVPB        2 g 200 mL/hr over 30 Minutes Intravenous  Once 06/10/22 0225 06/10/22 0300   06/10/22 0230  metroNIDAZOLE (FLAGYL) IVPB 500 mg        500 mg 100 mL/hr over 60 Minutes Intravenous  Once 06/10/22 0225 06/10/22 0349   06/10/22 0230  vancomycin (VANCOCIN) IVPB 1000 mg/200 mL premix        1,000 mg 200 mL/hr over 60 Minutes Intravenous  Once 06/10/22 0225 06/10/22 0358    06/09/22 2215  cefTRIAXone (ROCEPHIN) 1 g in sodium chloride 0.9 % 100 mL IVPB        1 g 200 mL/hr over 30 Minutes Intravenous  Once 06/09/22 2213 06/09/22 2330   06/09/22 0000  cephALEXin (KEFLEX) 500 MG capsule        500 mg Oral 3 times daily 06/09/22 2228         Subjective: Seen and examined at bedside and he was little bit more awake and alert.  Feels okay.  Nursing states he still little agitated.  No chest pain or shortness breath.  No other concerns or complaints this time.  Objective: Vitals:   06/12/22 0200 06/12/22 0203 06/12/22 0502 06/12/22 1401  BP: (!) 176/123 (!) 141/73 (!) 137/101 (!) 145/72  Pulse: (!) 105 (!) 103 69 (!) 53  Resp: '19 19 19 20  '$ Temp: (!) 97.3 F (36.3 C) (!) 97.4 F (36.3 C) 98.2 F (36.8 C) 99 F (37.2 C)  TempSrc: Oral Oral Oral Oral  SpO2: 90% (!) 89% 97% 94%  Weight:      Height:        Intake/Output Summary (Last 24 hours) at 06/12/2022 1910 Last data filed at 06/12/2022 1827 Gross per 24 hour  Intake 1500 ml  Output 850 ml  Net 650 ml   Filed Weights   06/10/22 0515 06/11/22 0500  Weight: 92.2 kg 99.5 kg   Examination: Physical Exam:  Constitutional: WN/WD obese Caucasian elderly male in no acute distress appears slightly uncomfortable Respiratory: Diminished to auscultation bilaterally with coarse breath sounds, no wheezing, rales, rhonchi or crackles. Normal respiratory effort and patient is not tachypenic. No accessory muscle use.  Unlabored breathing Cardiovascular: RRR, no murmurs / rubs / gallops. S1 and S2 auscultated. No extremity edema. 2+ pedal pulses. No carotid bruits.  Abdomen: Soft, non-tender, distended secondary by habitus. Bowel sounds positive.  GU: Deferred. Musculoskeletal: No clubbing / cyanosis of digits/nails. No joint deformity upper and lower extremities.  Skin: No rashes, lesions, ulcers limited skin evaluation. No induration; Warm and dry.  Neurologic: CN 2-12 grossly intact with no focal deficits.  Romberg sign and cerebellar reflexes not assessed.  Psychiatric: Normal judgment and insight. Alert and oriented x 3. Normal mood and appropriate affect.   Data Reviewed: I have personally reviewed following labs and imaging studies  CBC: Recent Labs  Lab 06/09/22 2110 06/10/22 0230 06/11/22 0311 06/12/22 0541  WBC 7.7 8.2 10.3 7.7  NEUTROABS 6.7 7.0 8.6* 6.3  HGB 13.3 13.2 11.5* 11.3*  HCT 40.6 39.9 35.1* 34.4*  MCV 85.1 84.5 85.6 84.9  PLT 254 262 217 [id number]   Basic Metabolic Panel: Recent Labs  Lab 06/09/22 2110 06/10/22 0230 06/10/22 0428 06/11/22 0311 06/12/22 0541  NA 128* 126*  --  127* 125*  K  3.4* 3.3*  --  3.0* 3.6  CL 90* 93*  --  95* 94*  CO2 28 24  --  24 23  GLUCOSE 123* 144*  --  112* 153*  BUN 22 20  --  16 22  CREATININE 1.01 1.00  --  0.76 0.86  CALCIUM 8.7* 8.5*  --  7.9* 8.2*  MG  --   --  1.5* 2.1 2.1  PHOS  --   --   --   --  2.3*   GFR: Estimated Creatinine Clearance: 82.4 mL/min (by C-G formula based on SCr of 0.86 mg/dL). Liver Function Tests: Recent Labs  Lab 06/09/22 2110 06/10/22 0230 06/11/22 0311 06/12/22 0541  AST '24 25 29 '$ 33  ALT '15 16 19 24  '$ ALKPHOS 71 73 65 74  BILITOT 0.7 0.9 0.9 1.0  PROT 7.3 7.3 6.4* 6.0*  ALBUMIN 3.3* 3.4* 2.8* 2.7*   No results for input(s): "LIPASE", "AMYLASE" in the last 168 hours. No results for input(s): "AMMONIA" in the last 168 hours. Coagulation Profile: Recent Labs  Lab 06/10/22 0230  INR 1.1   Cardiac Enzymes: No results for input(s): "CKTOTAL", "CKMB", "CKMBINDEX", "TROPONINI" in the last 168 hours. BNP (last 3 results) No results for input(s): "PROBNP" in the last 8760 hours. HbA1C: Recent Labs    06/11/22 0311  HGBA1C 6.5*   CBG: Recent Labs  Lab 06/11/22 1715 06/11/22 2024 06/12/22 0756 06/12/22 1138 06/12/22 1714  GLUCAP 119* 156* 148* 149* 122*   Lipid Profile: No results for input(s): "CHOL", "HDL", "LDLCALC", "TRIG", "CHOLHDL", "LDLDIRECT" in the last 72  hours. Thyroid Function Tests: No results for input(s): "TSH", "T4TOTAL", "FREET4", "T3FREE", "THYROIDAB" in the last 72 hours. Anemia Panel: No results for input(s): "VITAMINB12", "FOLATE", "FERRITIN", "TIBC", "IRON", "RETICCTPCT" in the last 72 hours. Sepsis Labs: Recent Labs  Lab 06/10/22 0230 06/10/22 0428  PROCALCITON  --  0.33  LATICACIDVEN 1.1 1.5    Recent Results (from the past 240 hour(s))  Urine Culture (for pregnant, neutropenic or urologic patients or patients with an indwelling urinary catheter)     Status: Abnormal   Collection Time: 06/09/22 11:33 PM   Specimen: Urine, Clean Catch  Result Value Ref Range Status   Specimen Description   Final    URINE, CLEAN CATCH Performed at [facility] [address] [place], [place] [address]    Special Requests   Final    NONE Performed at [facility], [place] [address], [place], [place] [address]    Culture >=100,000 COLONIES/mL ESCHERICHIA COLI (A)  Final   Report Status 06/12/2022 FINAL  Final   Organism ID, Bacteria ESCHERICHIA COLI (A)  Final      Susceptibility   Escherichia coli - MIC*    AMPICILLIN 8 SENSITIVE Sensitive     CEFAZOLIN <=4 SENSITIVE Sensitive     CEFEPIME <=0.12 SENSITIVE Sensitive     CEFTRIAXONE <=0.25 SENSITIVE Sensitive     CIPROFLOXACIN >=4 RESISTANT Resistant     GENTAMICIN <=1 SENSITIVE Sensitive     IMIPENEM <=0.25 SENSITIVE Sensitive     NITROFURANTOIN <=16 SENSITIVE Sensitive     TRIMETH/SULFA <=20 SENSITIVE Sensitive     AMPICILLIN/SULBACTAM 4 SENSITIVE Sensitive     PIP/TAZO <=4 SENSITIVE Sensitive     * >=100,000 COLONIES/mL ESCHERICHIA COLI  Blood culture (routine x 2)     Status: None (Preliminary result)   Collection Time: 06/10/22  2:30 AM   Specimen: BLOOD  Result Value Ref Range Status  Specimen Description   Final    BLOOD BLOOD LEFT FOREARM Performed at [facility] [address] [place], [place] [address]     Special Requests   Final    BOTTLES DRAWN AEROBIC AND ANAEROBIC Blood Culture adequate volume Performed at [facility] [address], [place], [place] [address]    Culture   Final    NO GROWTH 2 DAYS Performed at [facility] [address] [place], [place] [address]    Report Status PENDING  Incomplete  Resp panel by RT-PCR (RSV, Flu A&B, Covid) Anterior Nasal Swab     Status: None   Collection Time: 06/10/22  2:44 AM   Specimen: Anterior Nasal Swab  Result Value Ref Range Status   SARS Coronavirus 2 by RT PCR NEGATIVE NEGATIVE Final    Comment: (NOTE) SARS-CoV-2 target nucleic acids are NOT DETECTED.  The SARS-CoV-2 RNA is generally detectable in upper respiratory specimens during the acute phase of infection. The lowest concentration of SARS-CoV-2 viral copies this assay can detect is 138 copies/mL. A negative result does not preclude SARS-Cov-2 infection and should not be used as the sole basis for treatment or other patient management decisions. A negative result may occur with  improper specimen collection/handling, submission of specimen other than nasopharyngeal swab, presence of viral mutation(s) within the areas targeted by this assay, and inadequate number of viral copies(<138 copies/mL). A negative result must be combined with clinical observations, patient history, and epidemiological information. The expected result is Negative.  Fact Sheet for Patients:  EntrepreneurPulse.com.au  Fact Sheet for Healthcare Providers:  IncredibleEmployment.be  This test is no t yet approved or cleared by the [place] FDA and  has been authorized for detection and/or diagnosis of SARS-CoV-2 by FDA under an Emergency Use Authorization (EUA). This EUA will remain  in effect (meaning this test can be used) for the duration of the COVID-19 declaration under Section 564(b)(1) of the Act, 21 U.S.C.section  360bbb-3(b)(1), unless the authorization is terminated  or revoked sooner.       Influenza A by PCR NEGATIVE NEGATIVE Final   Influenza B by PCR NEGATIVE NEGATIVE Final    Comment: (NOTE) The Xpert Xpress SARS-CoV-2/FLU/RSV plus assay is intended as an aid in the diagnosis of influenza from Nasopharyngeal swab specimens and should not be used as a sole basis for treatment. Nasal washings and aspirates are unacceptable for Xpert Xpress SARS-CoV-2/FLU/RSV testing.  Fact Sheet for Patients: EntrepreneurPulse.com.au  Fact Sheet for Healthcare Providers: IncredibleEmployment.be  This test is not yet approved or cleared by the [place] FDA and has been authorized for detection and/or diagnosis of SARS-CoV-2 by FDA under an Emergency Use Authorization (EUA). This EUA will remain in effect (meaning this test can be used) for the duration of the COVID-19 declaration under Section 564(b)(1) of the Act, 21 U.S.C. section 360bbb-3(b)(1), unless the authorization is terminated or revoked.     Resp Syncytial Virus by PCR NEGATIVE NEGATIVE Final    Comment: (NOTE) Fact Sheet for Patients: EntrepreneurPulse.com.au  Fact Sheet for Healthcare Providers: IncredibleEmployment.be  This test is not yet approved or cleared by the [place] FDA and has been authorized for detection and/or diagnosis of SARS-CoV-2 by FDA under an Emergency Use Authorization (EUA). This EUA will remain in effect (meaning this test can be used) for the duration of the COVID-19 declaration under Section 564(b)(1) of the Act, 21 U.S.C. section 360bbb-3(b)(1), unless the authorization is terminated or revoked.  Performed at [facility], [place] [address], [place], [place] [address]   Blood culture (routine x 2)     Status: None (Preliminary result)   Collection Time: 06/10/22  3:13 AM   Specimen: BLOOD RIGHT WRIST   Result Value Ref Range Status   Specimen Description   Final    BLOOD RIGHT WRIST Performed at [facility] [address], [place], [place] [address]    Special Requests   Final    BOTTLES DRAWN AEROBIC AND ANAEROBIC Blood Culture adequate volume Performed at [facility] [address], [place], [place] [address]    Culture   Final    NO GROWTH 2 DAYS Performed at [facility] [address], [place], [place] [address]    Report Status PENDING  Incomplete  MRSA Next Gen by PCR, Nasal     Status: None   Collection Time: 06/10/22  5:35 AM   Specimen: Nasal Mucosa; Nasal Swab  Result Value Ref Range Status   MRSA by PCR Next Gen NOT DETECTED NOT DETECTED Final    Comment: (NOTE) The GeneXpert MRSA Assay (FDA approved for NASAL specimens only), is one component of a comprehensive MRSA colonization surveillance program. It is not intended to diagnose MRSA infection nor to guide or monitor treatment for MRSA infections. Test performance is not FDA approved in patients less than 45 years old. Performed at [facility], [place] [address] [place], [place] [address]   C Difficile Quick Screen w PCR reflex     Status: None   Collection Time: 06/11/22  4:11 PM   Specimen: STOOL  Result Value Ref Range Status   C Diff antigen NEGATIVE NEGATIVE Final   C Diff toxin NEGATIVE NEGATIVE Final   C Diff interpretation No C. difficile detected.  Final    Comment: Performed at [facility], [place] [address], [place],  [address]    Radiology Studies: DG CHEST PORT 1 VIEW  Result Date: 06/12/2022 CLINICAL DATA:  Shortness of breath. EXAM: PORTABLE CHEST 1 VIEW COMPARISON:  06/10/2022 prior studies FINDINGS: The cardiomediastinal silhouette is unchanged. Elevation of RIGHT hemidiaphragm again noted. There is no evidence of focal airspace disease, pulmonary edema, suspicious pulmonary nodule/mass, pleural  effusion, or pneumothorax. No acute bony abnormalities are identified. IMPRESSION: No active disease. Electronically Signed   By: Margarette Canada M.D.   On: 06/12/2022 08:35   DG Chest Port 1 View  Result Date: 06/10/2022 CLINICAL DATA:  Recent aspiration EXAM: PORTABLE CHEST 1 VIEW COMPARISON:  Film from earlier in the same day. FINDINGS: Cardiac shadow is enlarged but stable. Aortic calcifications are noted. The lungs are well aerated bilaterally. No focal infiltrate or effusion is seen. No bony abnormality is noted. Slight increase in central vascular congestion is noted without edema. IMPRESSION: Mild vascular congestion.  No other focal abnormality is noted. Electronically Signed   By: Inez Catalina M.D.   On: 06/10/2022 21:59    Scheduled Meds:  Chlorhexidine Gluconate Cloth  6 each Topical Daily   famotidine  20 mg Oral BID   insulin aspart  0-15 Units Subcutaneous TID WC   mouth rinse  15 mL Mouth Rinse 4 times per day   pantoprazole (PROTONIX) IV  40 mg Intravenous Q24H   sucralfate  1 g Oral TID WC & HS   Continuous Infusions:   ceFAZolin (ANCEF) IV 1 g (06/12/22 1521)    LOS: 2 days   Georgina Quint  Alfredia Ferguson, DO [facility] Available via Epic secure chat 7am-7pm After these hours, please refer to coverage provider listed on [email or web address] 06/12/2022, 7:10 PM

## 2022-06-13 ENCOUNTER — Inpatient Hospital Stay (HOSPITAL_COMMUNITY): Payer: Medicare PPO

## 2022-06-13 LAB — COMPREHENSIVE METABOLIC PANEL
ALT: 24 U/L (ref 0–44)
AST: 26 U/L (ref 15–41)
Albumin: 2.6 g/dL — ABNORMAL LOW (ref 3.5–5.0)
Alkaline Phosphatase: 81 U/L (ref 38–126)
Anion gap: 6 (ref 5–15)
BUN: 23 mg/dL (ref 8–23)
CO2: 24 mmol/L (ref 22–32)
Calcium: 8.2 mg/dL — ABNORMAL LOW (ref 8.9–10.3)
Chloride: 98 mmol/L (ref 98–111)
Creatinine, Ser: 0.77 mg/dL (ref 0.61–1.24)
GFR, Estimated: >60 mL/min (ref >60)
Glucose, Bld: 128 mg/dL — ABNORMAL HIGH (ref 70–99)
Potassium: 3.4 mmol/L — ABNORMAL LOW (ref 3.5–5.1)
Sodium: 128 mmol/L — ABNORMAL LOW (ref 135–145)
Total Bilirubin: 0.5 mg/dL (ref 0.3–1.2)
Total Protein: 6.2 g/dL — ABNORMAL LOW (ref 6.5–8.1)

## 2022-06-13 LAB — CBC WITH DIFFERENTIAL/PLATELET
Abs Immature Granulocytes: 0.08 10*3/uL — ABNORMAL HIGH (ref 0.00–0.07)
Basophils Absolute: 0 10*3/uL (ref 0.0–0.1)
Basophils Relative: 0 %
Eosinophils Absolute: 0.2 10*3/uL (ref 0.0–0.5)
Eosinophils Relative: 4 %
HCT: 31.9 % — ABNORMAL LOW (ref 39.0–52.0)
Hemoglobin: 10.5 g/dL — ABNORMAL LOW (ref 13.0–17.0)
Immature Granulocytes: 1 %
Lymphocytes Relative: 15 %
Lymphs Abs: 0.9 10*3/uL (ref 0.7–4.0)
MCH: 28.2 pg (ref 26.0–34.0)
MCHC: 32.9 g/dL (ref 30.0–36.0)
MCV: 85.8 fL (ref 80.0–100.0)
Monocytes Absolute: 0.6 10*3/uL (ref 0.1–1.0)
Monocytes Relative: 9 %
Neutro Abs: 4.4 10*3/uL (ref 1.7–7.7)
Neutrophils Relative %: 71 %
Platelets: 229 10*3/uL (ref 150–400)
RBC: 3.72 MIL/uL — ABNORMAL LOW (ref 4.22–5.81)
RDW: 15.4 % (ref 11.5–15.5)
WBC: 6.3 10*3/uL (ref 4.0–10.5)
nRBC: 0 % (ref 0.0–0.2)

## 2022-06-13 LAB — FERRITIN: Ferritin: 355 ng/mL — ABNORMAL HIGH (ref 24–336)

## 2022-06-13 LAB — PHOSPHORUS: Phosphorus: 3.3 mg/dL (ref 2.5–4.6)

## 2022-06-13 LAB — RETICULOCYTES
Immature Retic Fract: 9.6 % (ref 2.3–15.9)
RBC.: 3.72 MIL/uL — ABNORMAL LOW (ref 4.22–5.81)
Retic Count, Absolute: 37.2 10*3/uL (ref 19.0–186.0)
Retic Ct Pct: 1 % (ref 0.4–3.1)

## 2022-06-13 LAB — IRON AND TIBC
Iron: 42 ug/dL — ABNORMAL LOW (ref 45–182)
Saturation Ratios: 20 % (ref 17.9–39.5)
TIBC: 214 ug/dL — ABNORMAL LOW (ref 250–450)
UIBC: 172 ug/dL

## 2022-06-13 LAB — FOLATE: Folate: 15 ng/mL (ref >5.9)

## 2022-06-13 LAB — MAGNESIUM: Magnesium: 2 mg/dL (ref 1.7–2.4)

## 2022-06-13 LAB — GLUCOSE, CAPILLARY
Glucose-Capillary: 121 mg/dL — ABNORMAL HIGH (ref 70–99)
Glucose-Capillary: 131 mg/dL — ABNORMAL HIGH (ref 70–99)
Glucose-Capillary: 114 mg/dL — ABNORMAL HIGH (ref 70–99)
Glucose-Capillary: 159 mg/dL — ABNORMAL HIGH (ref 70–99)

## 2022-06-13 LAB — VITAMIN B12: Vitamin B-12: 372 pg/mL (ref 180–914)

## 2022-06-13 NOTE — Progress Notes (Signed)
PROGRESS NOTE    Steven Simmons  [id number] DOB: Aug 18, 1940 DOA: 06/09/2022 PCP: [name], MD   Brief Narrative:  The patient is an 82 year old elderly Caucasian male with a past medical history significant for but #2 allergies, osteoarthritis, aspiration pneumonia, peptic ulcer disease, history of upper GI bleed and history of lower GI bleed, cataracts, GERD, history of heart murmur as a child, history of TIA, hypertension, migraine headaches, history of sepsis due to UTI as well as recent pneumonia presented to the ED from EMS via [facility] for frontal mild headache and had not responded to treatment.  He was disoriented unable to provide a meaningful HPI but was able to answer simple questions.  Further workup revealed that he had a temperature 99.3 a pulse of 109, respirations of 20 and significantly elevated blood pressure 206/100.  He received acetaminophen, diltiazem, hydralazine, Benadryl, metoclopramide, lorazepam, 3 L of lactated Ringer boluses as well as vancomycin and metronidazole as well as cefepime.   Laboratory data showed that he had a stable white count but there is concern for urinary tract infection.  Head CT without contrast was done and showed atrophy and chronic microvascular disease but no acute intracranial abnormality.  Admitted for further workup of a urinary tract infection and aspiration   **Interim History Urinary tract infection is being treated and antibiotics have been de-escalated.  Sodium still remains on the low side and nursing reports intermittent confusion however on my evaluation he appears awake and alert and oriented.  Will need PT OT to further evaluate and treat and this is pending. His Mentation is improved but nursing states he was still a little confused. Na+ remains on the lower side but is slowly improving.  PT and OT recommending long-term institutional care without follow-up therapy with returning to SNF.   Patient is slowly improving but  chest x-ray today showed possible mediastinum so a CT scan was obtained which showed no evidence of mediastinum.  Sodium continues to improve and anticipating discharge in next 24 to 48 hours given that he is slowly improving  Assessment and Plan:  Acute cystitis and E. coli UTI -Admitted to SDU/inpatient and improved so transitioned to Floor  -Urinalysis showed a hazy appearance with small hemoglobin, large leukocytes, negative nitrates, few bacteria, greater than 50 WBCs and urine culture growing greater than 100,000 colony-forming units of E. coli which sensitivities were pansensitive and only resistant to ciprofloxacin so antibiotics will be de-escalated and continue on cefazolin as below -Discontinued IV fluids with NS at 75 mL/hr and will continue monitor -Continue cefepime 2 g every 8 hours but changed to IV cefazolin -Follow-up blood culture and sensitivity -Follow CBC and CMP in a.m.   Aspiration into airway  -Negative chest chest radiograph. -No changes seen oxygen saturation. -SLP consult and they recommending resuming a regular diet with thin liquids and try meds with whole pure have recommended no follow-up needs identified -Initial DG Chest X-Ray done and showed "Cardiac shadow is enlarged but stable. Aortic calcifications are noted. The lungs are well aerated bilaterally. No focal infiltrate or effusion is seen. No bony abnormality is noted. Slight increase in central vascular congestion is noted without edema." -Repeat CXR yesterday AM done and showed "The cardiomediastinal silhouette is unchanged. Elevation of RIGHT hemidiaphragm again noted. There is no evidence of focal airspace disease, pulmonary edema, suspicious pulmonary nodule/mass, pleural effusion, or pneumothorax. No acute bony abnormalities are identified." -Repeat chest x-ray done today and showed "Bibasilar atelectasis. Linear lucency adjacent to the  spine overlying the upper mediastinum, likely related to rotation  although pneumomediastinum cannot be excluded. CT chest recommended to further evaluate." -CT Scan of the Chest w/o Contrast done and showed "No evidence of  pneumomediastinum. Small right and trace left pleural effusions with associated compressive atelectasis. Mild smooth interlobular septal thickening, suggesting mild pulmonary edema. Mildly dilated central pulmonary vasculature, suggesting pulmonary arterial hypertension. Aortic and coronary artery atherosclerosis." -Patient will need an ambulatory home O2 screen and repeat chest x-ray in a.m.   HTN (Hypertension) -Blood pressures have been soft. -Continuing to Hold Diltiazem and, lisinopril and HCTZ for now -Continue to Monitor BP per Protocol -Last BP reading was 142/91   Paroxysmal Atrial Fibrillation (HCC) -CHA2DS2-VASc Score of at least 7. -Not on anticoagulation. -Was Holding Cardizem due to soft BP. -Continue with metoprolol tartrate 5 mg IV Q4 as needed for sustained heart rate greater than 130 for 3 doses   Hypophosphatemia -Phos Level was 2.3 and improved to 3.3 -Replete with po K Phos 500 mg po x1 yesterday -Continue to Replete as Necessary and repeat Phos Level in the AM    HLD (Hyperlipidemia) -No therapy currently listed. -Follow-up with primary.   Mild Protein Malnutrition ([place]) -C/w Protein supplementation. -Obtain dietitian and nutritionist evaluation   Pre-Diabetes -Carbohydrate modified diet. -Continue with moderate NovoLog sliding scale insulin before meals and CBG monitoring before meals and bedtime. -CBG's ranging from 114-131   Hyponatremia -Na+ Trend: Recent Labs  Lab 05/20/22 1221 05/29/22 1730 06/09/22 2110 06/10/22 0230 06/11/22 0311 06/12/22 0541 06/13/22 0550  NA 136 130* 128* 126* 127* 125* 128*  -IVF now stopped; Urine Osm was 505 and Urine Sodium was 28 -Continue to Monitor and Trend and repeat CMP in the AM    GERD (gastroesophageal reflux disease)/GI Prophylaxis -Continued  pantoprazole 40 mg p.o. daily but will change to IV 40 mg Daily  -Patient requesting Carafate that this has been added back and also requesting Zantac but no changes to famotidine twice daily   Hypokalemia -Patient's K+ Level Trend: Recent Labs  Lab 05/20/22 1221 05/29/22 1730 06/09/22 2110 06/10/22 0230 06/11/22 0311 06/12/22 0541 06/13/22 0550  K 3.9 5.1 3.4* 3.3* 3.0* 3.6 3.4*  -Continue to Monitor and Replete as Necessary -Repeat CMP in the AM    Hypomagnesemia, improved  -Patient's Mag Level Trend: Recent Labs  Lab 06/10/22 0428 06/11/22 0311 06/12/22 0541 06/13/22 0550  MG 1.5* 2.1 2.1 2.0  -Continue to Monitor and Replete as Necessary -Repeat Mag in the AM    Normocytic Anemia -Hgb/Hct trend: Recent Labs  Lab 05/20/22 1221 05/29/22 1730 06/09/22 2110 06/10/22 0230 06/11/22 0311 06/12/22 0541 06/13/22 0550  HGB 13.3 13.3 13.3 13.2 11.5* 11.3* 10.5*  HCT 42.1 41.6 40.6 39.9 35.1* 34.4* 31.9*  MCV 87.0 86.1 85.1 84.5 85.6 84.9 85.8  -Checked Anemia Panel and showed an iron level of 42, UIBC of 172, TIBC of 214, saturation ratios of 20, ferritin level 355, folate 15.0, vitamin B12 level 372 -Continue to Monitor for S/Sx of Bleeding; No overt bleeding noted -Repeat CBC in the AM    Hypoalbuminemia -Patient's Albumin Trend: Recent Labs  Lab 05/20/22 1221 05/29/22 1730 06/09/22 2110 06/10/22 0230 06/11/22 0311 06/12/22 0541 06/13/22 0550  ALBUMIN 3.9 3.7 3.3* 3.4* 2.8* 2.7* 2.6*  -Continue to Monitor and Trend and repeat CMP in the AM   Obesity -Complicates overall prognosis and care -Estimated body mass index is 30.59 kg/m as calculated from the following:   Height as of this encounter:  $'5\' 11"'S$  (1.803 m).   Weight as of this encounter: 99.5 kg.  -Weight Loss and Dietary Counseling given  DVT prophylaxis: SCDs Start: 06/10/22 0411    Code Status: DNR Family Communication: No family currently at bedside  Disposition Plan:  Level of care:  Progressive Status is: Inpatient Remains inpatient appropriate because: Needs to go back to SNF once he is further improved and sodium continues to trend in the right direction.  Currently getting treated for his urinary tract infection   Consultants:  None  Procedures:  As delineated as above  Antimicrobials:  Anti-infectives (From admission, onward)    Start     Dose/Rate Route Frequency Ordered Stop   06/12/22 1415  ceFAZolin (ANCEF) IVPB 1 g/50 mL premix        1 g 100 mL/hr over 30 Minutes Intravenous Every 8 hours 06/12/22 1317     06/10/22 2200  vancomycin (VANCOREADY) IVPB 1500 mg/300 mL  Status:  Discontinued        1,500 mg 150 mL/hr over 120 Minutes Intravenous Every 24 hours 06/10/22 0321 06/10/22 0412   06/10/22 1000  ceFEPIme (MAXIPIME) 2 g in sodium chloride 0.9 % 100 mL IVPB  Status:  Discontinued        2 g 200 mL/hr over 30 Minutes Intravenous Every 8 hours 06/10/22 0321 06/12/22 1317   06/10/22 0230  ceFEPIme (MAXIPIME) 2 g in sodium chloride 0.9 % 100 mL IVPB        2 g 200 mL/hr over 30 Minutes Intravenous  Once 06/10/22 0225 06/10/22 0300   06/10/22 0230  metroNIDAZOLE (FLAGYL) IVPB 500 mg        500 mg 100 mL/hr over 60 Minutes Intravenous  Once 06/10/22 0225 06/10/22 0349   06/10/22 0230  vancomycin (VANCOCIN) IVPB 1000 mg/200 mL premix        1,000 mg 200 mL/hr over 60 Minutes Intravenous  Once 06/10/22 0225 06/10/22 0358   06/09/22 2215  cefTRIAXone (ROCEPHIN) 1 g in sodium chloride 0.9 % 100 mL IVPB        1 g 200 mL/hr over 30 Minutes Intravenous  Once 06/09/22 2213 06/09/22 2330   06/09/22 0000  cephALEXin (KEFLEX) 500 MG capsule        500 mg Oral 3 times daily 06/09/22 2228         Subjective: Seen and examined at bedside he was sitting in a chair and felt "comfortable".  States that he will walk later.  No nausea or vomiting.  Denies any lightheadedness or dizziness.  Thinks he is doing better.  No other concerns or complaints this  time.  Objective: Vitals:   06/12/22 1401 06/12/22 2008 06/13/22 0353 06/13/22 1342  BP: (!) 145/72 (!) 141/95 (!) 150/97 (!) 142/91  Pulse: (!) 53 75 96 99  Resp: '20 18 20 20  '$ Temp: 99 F (37.2 C) 97.9 F (36.6 C) 97.8 F (36.6 C)   TempSrc: Oral Oral Oral   SpO2: 94% 99% 100% 100%  Weight:      Height:        Intake/Output Summary (Last 24 hours) at 06/13/2022 1403 Last data filed at 06/13/2022 1403 Gross per 24 hour  Intake 900 ml  Output 1400 ml  Net -500 ml   Filed Weights   06/10/22 0515 06/11/22 0500  Weight: 92.2 kg 99.5 kg   Examination: Physical Exam:  Constitutional: WN/WD obese elderly Caucasian male in no acute distress Respiratory: Diminished to auscultation bilaterally with some coarse  breath sounds, no wheezing, rales, rhonchi or crackles. Normal respiratory effort and patient is not tachypenic. No accessory muscle use.  Unlabored breathing Cardiovascular: RRR, no murmurs / rubs / gallops. S1 and S2 auscultated.  No appreciable extremity edema Abdomen: Soft, non-tender, distended secondary to body habitus.  Bowel sounds positive.  GU: Deferred. Musculoskeletal: No clubbing / cyanosis of digits/nails. No joint deformity upper and lower extremities.  Skin: Has some facial lesions noted and has some lesions on his forehead that are dry.  Patient states these are chronic for years and he has no no induration; Warm and dry.  Neurologic: CN 2-12 grossly intact with no focal deficits. Romberg sign and cerebellar reflexes not assessed.  Psychiatric: Normal judgment and insight. Alert and oriented x 3. Normal mood and appropriate affect.   Data Reviewed: I have personally reviewed following labs and imaging studies  CBC: Recent Labs  Lab 06/09/22 2110 06/10/22 0230 06/11/22 0311 06/12/22 0541 06/13/22 0550  WBC 7.7 8.2 10.3 7.7 6.3  NEUTROABS 6.7 7.0 8.6* 6.3 4.4  HGB 13.3 13.2 11.5* 11.3* 10.5*  HCT 40.6 39.9 35.1* 34.4* 31.9*  MCV 85.1 84.5 85.6 84.9  85.8  PLT 254 262 217 205 [id number]   Basic Metabolic Panel: Recent Labs  Lab 06/09/22 2110 06/10/22 0230 06/10/22 0428 06/11/22 0311 06/12/22 0541 06/13/22 0550  NA 128* 126*  --  127* 125* 128*  K 3.4* 3.3*  --  3.0* 3.6 3.4*  CL 90* 93*  --  95* 94* 98  CO2 28 24  --  '24 23 24  '$ GLUCOSE 123* 144*  --  112* 153* 128*  BUN 22 20  --  '16 22 23  '$ CREATININE 1.01 1.00  --  0.76 0.86 0.77  CALCIUM 8.7* 8.5*  --  7.9* 8.2* 8.2*  MG  --   --  1.5* 2.1 2.1 2.0  PHOS  --   --   --   --  2.3* 3.3   GFR: Estimated Creatinine Clearance: 88.5 mL/min (by C-G formula based on SCr of 0.77 mg/dL). Liver Function Tests: Recent Labs  Lab 06/09/22 2110 06/10/22 0230 06/11/22 0311 06/12/22 0541 06/13/22 0550  AST '24 25 29 '$ 33 26  ALT '15 16 19 24 24  '$ ALKPHOS 71 73 65 74 81  BILITOT 0.7 0.9 0.9 1.0 0.5  PROT 7.3 7.3 6.4* 6.0* 6.2*  ALBUMIN 3.3* 3.4* 2.8* 2.7* 2.6*   No results for input(s): "LIPASE", "AMYLASE" in the last 168 hours. No results for input(s): "AMMONIA" in the last 168 hours. Coagulation Profile: Recent Labs  Lab 06/10/22 0230  INR 1.1   Cardiac Enzymes: No results for input(s): "CKTOTAL", "CKMB", "CKMBINDEX", "TROPONINI" in the last 168 hours. BNP (last 3 results) No results for input(s): "PROBNP" in the last 8760 hours. HbA1C: Recent Labs    06/11/22 0311  HGBA1C 6.5*   CBG: Recent Labs  Lab 06/12/22 1138 06/12/22 1714 06/12/22 2118 06/13/22 0724 06/13/22 1120  GLUCAP 149* 122* 134* 121* 131*   Lipid Profile: No results for input(s): "CHOL", "HDL", "LDLCALC", "TRIG", "CHOLHDL", "LDLDIRECT" in the last 72 hours. Thyroid Function Tests: No results for input(s): "TSH", "T4TOTAL", "FREET4", "T3FREE", "THYROIDAB" in the last 72 hours. Anemia Panel: Recent Labs    06/13/22 0550  VITAMINB12 372  FOLATE 15.0  FERRITIN 355*  TIBC 214*  IRON 42*  RETICCTPCT 1.0   Sepsis Labs: Recent Labs  Lab 06/10/22 0230 06/10/22 0428  PROCALCITON  --  0.33   LATICACIDVEN 1.1 1.5  Recent Results (from the past 240 hour(s))  Urine Culture (for pregnant, neutropenic or urologic patients or patients with an indwelling urinary catheter)     Status: Abnormal   Collection Time: 06/09/22 11:33 PM   Specimen: Urine, Clean Catch  Result Value Ref Range Status   Specimen Description   Final    URINE, CLEAN CATCH Performed at [facility], [place] [address] [place], [place] [address]    Special Requests   Final    NONE Performed at [facility], [place] [address], [place], [place] [address]    Culture >=100,000 COLONIES/mL ESCHERICHIA COLI (A)  Final   Report Status 06/12/2022 FINAL  Final   Organism ID, Bacteria ESCHERICHIA COLI (A)  Final      Susceptibility   Escherichia coli - MIC*    AMPICILLIN 8 SENSITIVE Sensitive     CEFAZOLIN <=4 SENSITIVE Sensitive     CEFEPIME <=0.12 SENSITIVE Sensitive     CEFTRIAXONE <=0.25 SENSITIVE Sensitive     CIPROFLOXACIN >=4 RESISTANT Resistant     GENTAMICIN <=1 SENSITIVE Sensitive     IMIPENEM <=0.25 SENSITIVE Sensitive     NITROFURANTOIN <=16 SENSITIVE Sensitive     TRIMETH/SULFA <=20 SENSITIVE Sensitive     AMPICILLIN/SULBACTAM 4 SENSITIVE Sensitive     PIP/TAZO <=4 SENSITIVE Sensitive     * >=100,000 COLONIES/mL ESCHERICHIA COLI  Blood culture (routine x 2)     Status: None (Preliminary result)   Collection Time: 06/10/22  2:30 AM   Specimen: BLOOD  Result Value Ref Range Status   Specimen Description   Final    BLOOD BLOOD LEFT FOREARM Performed at [facility] [address] [place], [place] [address]    Special Requests   Final    BOTTLES DRAWN AEROBIC AND ANAEROBIC Blood Culture adequate volume Performed at [facility] [address] [place], [place] [address]    Culture   Final    NO GROWTH 3 DAYS Performed at [facility], [place] [address] [place], [place] [address]    Report Status PENDING  Incomplete   Resp panel by RT-PCR (RSV, Flu A&B, Covid) Anterior Nasal Swab     Status: None   Collection Time: 06/10/22  2:44 AM   Specimen: Anterior Nasal Swab  Result Value Ref Range Status   SARS Coronavirus 2 by RT PCR NEGATIVE NEGATIVE Final    Comment: (NOTE) SARS-CoV-2 target nucleic acids are NOT DETECTED.  The SARS-CoV-2 RNA is generally detectable in upper respiratory specimens during the acute phase of infection. The lowest concentration of SARS-CoV-2 viral copies this assay can detect is 138 copies/mL. A negative result does not preclude SARS-Cov-2 infection and should not be used as the sole basis for treatment or other patient management decisions. A negative result may occur with  improper specimen collection/handling, submission of specimen other than nasopharyngeal swab, presence of viral mutation(s) within the areas targeted by this assay, and inadequate number of viral copies(<138 copies/mL). A negative result must be combined with clinical observations, patient history, and epidemiological information. The expected result is Negative.  Fact Sheet for Patients:  EntrepreneurPulse.com.au  Fact Sheet for Healthcare Providers:  IncredibleEmployment.be  This test is no t yet approved or cleared by the [place] FDA and  has been authorized for detection and/or diagnosis of SARS-CoV-2 by FDA under an Emergency Use Authorization (EUA). This EUA will remain  in effect (meaning this test can be used) for the duration of  the COVID-19 declaration under Section 564(b)(1) of the Act, 21 U.S.C.section 360bbb-3(b)(1), unless the authorization is terminated  or revoked sooner.       Influenza A by PCR NEGATIVE NEGATIVE Final   Influenza B by PCR NEGATIVE NEGATIVE Final    Comment: (NOTE) The Xpert Xpress SARS-CoV-2/FLU/RSV plus assay is intended as an aid in the diagnosis of influenza from Nasopharyngeal swab specimens and should not be used  as a sole basis for treatment. Nasal washings and aspirates are unacceptable for Xpert Xpress SARS-CoV-2/FLU/RSV testing.  Fact Sheet for Patients: EntrepreneurPulse.com.au  Fact Sheet for Healthcare Providers: IncredibleEmployment.be  This test is not yet approved or cleared by the [place] FDA and has been authorized for detection and/or diagnosis of SARS-CoV-2 by FDA under an Emergency Use Authorization (EUA). This EUA will remain in effect (meaning this test can be used) for the duration of the COVID-19 declaration under Section 564(b)(1) of the Act, 21 U.S.C. section 360bbb-3(b)(1), unless the authorization is terminated or revoked.     Resp Syncytial Virus by PCR NEGATIVE NEGATIVE Final    Comment: (NOTE) Fact Sheet for Patients: EntrepreneurPulse.com.au  Fact Sheet for Healthcare Providers: IncredibleEmployment.be  This test is not yet approved or cleared by the [place] FDA and has been authorized for detection and/or diagnosis of SARS-CoV-2 by FDA under an Emergency Use Authorization (EUA). This EUA will remain in effect (meaning this test can be used) for the duration of the COVID-19 declaration under Section 564(b)(1) of the Act, 21 U.S.C. section 360bbb-3(b)(1), unless the authorization is terminated or revoked.  Performed at [facility], [place] [address] [place], [place] [address]   Blood culture (routine x 2)     Status: None (Preliminary result)   Collection Time: 06/10/22  3:13 AM   Specimen: BLOOD RIGHT WRIST  Result Value Ref Range Status   Specimen Description   Final    BLOOD RIGHT WRIST Performed at [place] [address] [place], [place] [address]    Special Requests   Final    BOTTLES DRAWN AEROBIC AND ANAEROBIC Blood Culture adequate volume Performed at [facility] [address], [place], [place]  [address]    Culture   Final    NO GROWTH 3 DAYS Performed at [facility], [place] [address], [place], [place] [address]    Report Status PENDING  Incomplete  MRSA Next Gen by PCR, Nasal     Status: None   Collection Time: 06/10/22  5:35 AM   Specimen: Nasal Mucosa; Nasal Swab  Result Value Ref Range Status   MRSA by PCR Next Gen NOT DETECTED NOT DETECTED Final    Comment: (NOTE) The GeneXpert MRSA Assay (FDA approved for NASAL specimens only), is one component of a comprehensive MRSA colonization surveillance program. It is not intended to diagnose MRSA infection nor to guide or monitor treatment for MRSA infections. Test performance is not FDA approved in patients less than 60 years old. Performed at [facility], [place] [address] [place], [place] [address]   C Difficile Quick Screen w PCR reflex     Status: None   Collection Time: 06/11/22  4:11 PM   Specimen: STOOL  Result Value Ref Range Status   C Diff antigen NEGATIVE NEGATIVE Final   C Diff toxin NEGATIVE NEGATIVE Final   C Diff interpretation No C. difficile detected.  Final    Comment: Performed at [facility], [place]  [address] [place], [place] [address]     Radiology Studies: CT CHEST WO CONTRAST  Result Date: 06/13/2022 CLINICAL DATA:  Shortness of breath. Concern for pneumomediastinum on x-ray EXAM: CT CHEST WITHOUT CONTRAST TECHNIQUE: Multidetector CT imaging of the chest was performed following the standard protocol without IV contrast. RADIATION DOSE REDUCTION: This exam was performed according to the departmental dose-optimization program which includes automated exposure control, adjustment of the mA and/or kV according to patient size and/or use of iterative reconstruction technique. COMPARISON:  X-ray 06/13/2022.  CT 03/03/2022 FINDINGS: Cardiovascular: Heart size is mildly enlarged. No pericardial effusion. Thoracic aorta is nonaneurysmal. Atherosclerotic calcifications of  the aorta and coronary arteries. There also calcifications of the aortic valve and mitral annulus. Central pulmonary vasculature is mildly dilated. Mediastinum/Nodes: No pneumomediastinum. No mediastinal fluid collection or hematoma. Stable mildly enlarged lower right paratracheal lymph node measuring 12 mm short axis (series 2, image 45). No new or enlarging mediastinal lymph nodes. No axillary or hilar lymphadenopathy. Thyroid, trachea, and esophagus demonstrate no significant findings. Lungs/Pleura: Small right and trace left pleural effusions with associated compressive atelectasis. Mild smooth interlobular septal thickening. No additional alveolar airspace opacities. No pneumothorax. Upper Abdomen: No acute abnormality. Musculoskeletal: No chest wall mass or suspicious bone lesions identified. Diffuse ankylosis of the thoracic spine. IMPRESSION: 1. No evidence of pneumomediastinum. 2. Small right and trace left pleural effusions with associated compressive atelectasis. 3. Mild smooth interlobular septal thickening, suggesting mild pulmonary edema. 4. Mildly dilated central pulmonary vasculature, suggesting pulmonary arterial hypertension. 5. Aortic and coronary artery atherosclerosis (ICD10-I70.0). Electronically Signed   By: Davina Poke D.O.   On: 06/13/2022 12:26   DG CHEST PORT 1 VIEW  Result Date: 06/13/2022 CLINICAL DATA:  Shortness of breath. EXAM: PORTABLE CHEST 1 VIEW COMPARISON:  06/12/2022 FINDINGS: Bibasilar atelectasis noted. The cardio pericardial silhouette is enlarged. Both AP films of this study a rotated to the left. There is linear lucency adjacent to the spine overlying the upper mediastinum, likely related to rotation although pneumomediastinum cannot be excluded. Bones are diffusely demineralized. Telemetry leads overlie the chest. IMPRESSION: 1. Bibasilar atelectasis. 2. Linear lucency adjacent to the spine overlying the upper mediastinum, likely related to rotation although  pneumomediastinum cannot be excluded. CT chest recommended to further evaluate. These results will be called to the ordering clinician or representative by the Radiologist Assistant, and communication documented in the PACS or [employer]. Electronically Signed   By: Misty Stanley M.D.   On: 06/13/2022 10:07   DG CHEST PORT 1 VIEW  Result Date: 06/12/2022 CLINICAL DATA:  Shortness of breath. EXAM: PORTABLE CHEST 1 VIEW COMPARISON:  06/10/2022 prior studies FINDINGS: The cardiomediastinal silhouette is unchanged. Elevation of RIGHT hemidiaphragm again noted. There is no evidence of focal airspace disease, pulmonary edema, suspicious pulmonary nodule/mass, pleural effusion, or pneumothorax. No acute bony abnormalities are identified. IMPRESSION: No active disease. Electronically Signed   By: Margarette Canada M.D.   On: 06/12/2022 08:35     Scheduled Meds:  Chlorhexidine Gluconate Cloth  6 each Topical Daily   famotidine  20 mg Oral BID   insulin aspart  0-15 Units Subcutaneous TID WC   mouth rinse  15 mL Mouth Rinse 4 times per day   pantoprazole (PROTONIX) IV  40 mg Intravenous Q24H   sucralfate  1 g Oral TID WC & HS   Continuous Infusions:   ceFAZolin (ANCEF) IV 1 g (06/13/22 1336)    LOS: 3 days   Raiford Noble, DO [facility] Available via  Epic secure chat 7am-7pm After these hours, please refer to coverage provider listed on [email or web address] 06/13/2022, 2:03 PM

## 2022-06-13 NOTE — Evaluation (Signed)
Physical Therapy Evaluation-1x Patient Details Name: Steven Simmons MRN: [id number] DOB: 02-17-1941 Today's Date: 06/13/2022  History of Present Illness  The patient is an 82 year old elderly Caucasian male with a past medical history significant for but #2 allergies, osteoarthritis, aspiration pneumonia, peptic ulcer disease, history of upper GI bleed and history of lower GI bleed, cataracts, GERD, history of heart murmur as a child, history of TIA, hypertension, migraine headaches, history of sepsis due to UTI as well as recent pneumonia presented to the ED. Admitted with acute cystitis  Clinical Impression  On eval, pt was Min guard A for mobility. He stood and took several steps along the bedside with a RW. Assisted tp back to bed at end of session. Per chart review, pt is LTC SNF resident with plans to return to SNF once medically ready. No acute PT needs. 1x eval. Mobility team and/or nursing can assist with mobilizing pt as tolerated.        Recommendations for follow up therapy are one component of a multi-disciplinary discharge planning process, led by the attending physician.  Recommendations may be updated based on patient status, additional functional criteria and insurance authorization.  Follow Up Recommendations Long-term institutional care without follow-up therapy (return to SNF) Can patient physically be transported by private vehicle: Yes    Assistance Recommended at Discharge PRN  Patient can return home with the following       Equipment Recommendations    Recommendations for Other Services       Functional Status Assessment Patient has not had a recent decline in their functional status     Precautions / Restrictions Precautions Precautions: Fall Restrictions Weight Bearing Restrictions: No      Mobility  Bed Mobility Overal bed mobility: Needs Assistance Bed Mobility: Supine to Sit, Sit to Supine     Supine to sit: Modified independent (Device/Increase  time), HOB elevated Sit to supine: Modified independent (Device/Increase time), HOB elevated        Transfers Overall transfer level: Needs assistance Equipment used: Rolling walker (2 wheels) Transfers: Sit to/from Stand Sit to Stand: From elevated surface, Min guard           General transfer comment: Min guard A for safety. Used RW for safety.    Ambulation/Gait               General Gait Details: side steps along bedside with RW. Min guard A. Tolerated well.  Stairs            Wheelchair Mobility    Modified Rankin (Stroke Patients Only)       Balance Overall balance assessment: Needs assistance           Standing balance-Leahy Scale: Poor                               Pertinent Vitals/Pain Pain Assessment Pain Assessment: No/denies pain    Home Living Family/patient expects to be discharged to:: Skilled nursing facility                   Additional Comments: LTC SNF    Prior Function Prior Level of Function : Needs assist             Mobility Comments: reports he can walk with a walker but has been getting around mostly with wheelchair at facility ADLs Comments: reports independence with dressing and toileting, suspect assistance with bathing and medication  Hand Dominance        Extremity/Trunk Assessment   Upper Extremity Assessment Upper Extremity Assessment: Defer to OT evaluation    Lower Extremity Assessment Lower Extremity Assessment: Generalized weakness    Cervical / Trunk Assessment Cervical / Trunk Assessment: Kyphotic  Communication   Communication: No difficulties  Cognition Arousal/Alertness: Awake/alert Behavior During Therapy: WFL for tasks assessed/performed Overall Cognitive Status: Within Functional Limits for tasks assessed                                 General Comments: talkative        General Comments      Exercises     Assessment/Plan    PT  Assessment All further PT needs can be met in the next venue of care (return to SNF-LTC)  PT Problem List         PT Treatment Interventions      PT Goals (Current goals can be found in the Care Plan section)  Acute Rehab PT Goals Patient Stated Goal: none stated PT Goal Formulation: All assessment and education complete, DC therapy    Frequency       Co-evaluation               AM-PAC PT "6 Clicks" Mobility  Outcome Measure Help needed turning from your back to your side while in a flat bed without using bedrails?: None Help needed moving from lying on your back to sitting on the side of a flat bed without using bedrails?: None Help needed moving to and from a bed to a chair (including a wheelchair)?: A Little Help needed standing up from a chair using your arms (e.g., wheelchair or bedside chair)?: A Little Help needed to walk in hospital room?: A Little Help needed climbing 3-5 steps with a railing? : Total 6 Click Score: 18    End of Session   Activity Tolerance: Patient tolerated treatment well Patient left: in bed;with call bell/phone within reach;with bed alarm set        Time: 1430-1443 PT Time Calculation (min) (ACUTE ONLY): 13 min   Charges:   PT Evaluation $PT Eval Low Complexity: 1 Low             Doreatha Massed, PT Acute Rehabilitation  Office: [phone]

## 2022-06-13 NOTE — Evaluation (Signed)
Occupational Therapy Evaluation Patient Details Name: Steven Simmons MRN: [id number] DOB: 10/27/1940 Today's Date: 06/13/2022   History of Present Illness The patient is an 82 year old elderly Caucasian male with a past medical history significant for but #2 allergies, osteoarthritis, aspiration pneumonia, peptic ulcer disease, history of upper GI bleed and history of lower GI bleed, cataracts, GERD, history of heart murmur as a child, history of TIA, hypertension, migraine headaches, history of sepsis due to UTI as well as recent pneumonia presented to the ED. Admitted with acute cystitis   Clinical Impression   Mr. Steven Simmons is an 82 year old man who presents with generalized weakness, impaired balance and decreased activity tolerance. On evaluation he is able to transfer himself to edge of bed, don socks, stand and take unsteady steps to sink and wash his hands and then sit down and prepare his breakfast tray. He is alert to self and place but not date. He reports mild pain - though did not state where and some dizziness with standing. He reports he has been using a wheelchair mostly at facility but can use a walker. He exhibits flexed knees in standing and unsteadiness without walker. From an occupational therapy standpoint he did well with ADLs. Do not think he needs acute care OT needs. Recommend return to facility.      Recommendations for follow up therapy are one component of a multi-disciplinary discharge planning process, led by the attending physician.  Recommendations may be updated based on patient status, additional functional criteria and insurance authorization.   Follow Up Recommendations  Skilled nursing-short term rehab (<3 hours/day)     Assistance Recommended at Discharge Intermittent Supervision/Assistance  Patient can return home with the following A little help with walking and/or transfers;A little help with bathing/dressing/bathroom;Assistance with  cooking/housework;Direct supervision/assist for medications management;Assist for transportation;Direct supervision/assist for financial management    Functional Status Assessment  Patient has had a recent decline in their functional status and/or demonstrates limited ability to make significant improvements in function in a reasonable and predictable amount of time  Equipment Recommendations  None recommended by OT    Recommendations for Other Services       Precautions / Restrictions Precautions Precautions: Fall Restrictions Weight Bearing Restrictions: No      Mobility Bed Mobility Overal bed mobility: Needs Assistance Bed Mobility: Supine to Sit     Supine to sit: Supervision          Transfers Overall transfer level: Needs assistance Equipment used: None Transfers: Sit to/from Stand Sit to Stand: Min guard           General transfer comment: Initially patient reported he doesn't use a walker so therapist had him take steps to the sink with min guard. His needs maintained a flexed position and he was unsteady. Then found out he has mostly been getting around via wheelchair. He says he can use a walker as well.      Balance Overall balance assessment: Mild deficits observed, not formally tested                                         ADL either performed or assessed with clinical judgement   ADL Overall ADL's : Needs assistance/impaired Eating/Feeding: Independent   Grooming: Standing;Min guard;Wash/dry hands Grooming Details (indicate cue type and reason): stood at sink to wash hands with upper extremity support. Poor standing posture with  flexed knees Upper Body Bathing: Set up;Sitting   Lower Body Bathing: Supervison/ safety;Set up;Sitting/lateral leans   Upper Body Dressing : Set up;Sitting   Lower Body Dressing: Minimal assistance;Sit to/from stand   Toilet Transfer: Min guard;BSC/3in1   Toileting- Water quality scientist and  Hygiene: Supervision/safety;Sitting/lateral lean       Functional mobility during ADLs: Min guard General ADL Comments: Able to take steps to the sink without a walker but he's needs stay flexed and he was unsteady.     Vision   Vision Assessment?: No apparent visual deficits     Perception     Praxis      Pertinent Vitals/Pain Pain Assessment Pain Assessment: No/denies pain     Hand Dominance     Extremity/Trunk Assessment Upper Extremity Assessment Upper Extremity Assessment: Overall WFL for tasks assessed   Lower Extremity Assessment Lower Extremity Assessment: Defer to PT evaluation   Cervical / Trunk Assessment Cervical / Trunk Assessment: Kyphotic   Communication Communication Communication: No difficulties   Cognition Arousal/Alertness: Awake/alert Behavior During Therapy: WFL for tasks assessed/performed                                   General Comments: Is alert to self, place, year and President. Does not know the date, day of week or month.     General Comments       Exercises     Shoulder Instructions      Home Living Family/patient expects to be discharged to:: Skilled nursing facility                                 Additional Comments: Has been at [facility] since December. Prior to that lived alone.      Prior Functioning/Environment Prior Level of Function : Needs assist             Mobility Comments: reports he can walk with a walker but has been getting around mostly with wheelchair at facility ADLs Comments: reports independence with dressing and toileting, suspect assistance with bathing and medication        OT Problem List: Impaired balance (sitting and/or standing);Pain;Decreased activity tolerance      OT Treatment/Interventions:      OT Goals(Current goals can be found in the care plan section) Acute Rehab OT Goals OT Goal Formulation: All assessment and education complete, DC  therapy  OT Frequency:      Co-evaluation              AM-PAC OT "6 Clicks" Daily Activity     Outcome Measure Help from another person eating meals?: None Help from another person taking care of personal grooming?: A Little Help from another person toileting, which includes using toliet, bedpan, or urinal?: A Little Help from another person bathing (including washing, rinsing, drying)?: A Little Help from another person to put on and taking off regular upper body clothing?: A Little Help from another person to put on and taking off regular lower body clothing?: A Little 6 Click Score: 19   End of Session Nurse Communication: Mobility status  Activity Tolerance: Patient tolerated treatment well Patient left: in chair;with call bell/phone within reach;with chair alarm set  OT Visit Diagnosis: Pain                Time: [id number] OT Time Calculation (min): 9  min Charges:  OT General Charges $OT Visit: 1 Visit OT Evaluation $OT Eval Low Complexity: 1 Low  Gustavo Lah, OTR/L [place]  Office [phone]  Lenward Chancellor 06/13/2022, 9:33 AM

## 2022-06-14 ENCOUNTER — Other Ambulatory Visit (HOSPITAL_COMMUNITY): Payer: Self-pay

## 2022-06-14 LAB — CBC WITH DIFFERENTIAL/PLATELET
Abs Immature Granulocytes: 0.21 10*3/uL — ABNORMAL HIGH (ref 0.00–0.07)
Basophils Absolute: 0 10*3/uL (ref 0.0–0.1)
Basophils Relative: 0 %
Eosinophils Absolute: 0.4 10*3/uL (ref 0.0–0.5)
Eosinophils Relative: 4 %
HCT: 35 % — ABNORMAL LOW (ref 39.0–52.0)
Hemoglobin: 11.2 g/dL — ABNORMAL LOW (ref 13.0–17.0)
Immature Granulocytes: 3 %
Lymphocytes Relative: 15 %
Lymphs Abs: 1.2 10*3/uL (ref 0.7–4.0)
MCH: 27.9 pg (ref 26.0–34.0)
MCHC: 32 g/dL (ref 30.0–36.0)
MCV: 87.3 fL (ref 80.0–100.0)
Monocytes Absolute: 0.6 10*3/uL (ref 0.1–1.0)
Monocytes Relative: 7 %
Neutro Abs: 5.9 10*3/uL (ref 1.7–7.7)
Neutrophils Relative %: 71 %
Platelets: 325 10*3/uL (ref 150–400)
RBC: 4.01 MIL/uL — ABNORMAL LOW (ref 4.22–5.81)
RDW: 15.7 % — ABNORMAL HIGH (ref 11.5–15.5)
WBC: 8.3 10*3/uL (ref 4.0–10.5)
nRBC: 0 % (ref 0.0–0.2)

## 2022-06-14 LAB — COMPREHENSIVE METABOLIC PANEL
ALT: 19 U/L (ref 0–44)
AST: 37 U/L (ref 15–41)
Albumin: 2.9 g/dL — ABNORMAL LOW (ref 3.5–5.0)
Alkaline Phosphatase: 82 U/L (ref 38–126)
Anion gap: 9 (ref 5–15)
BUN: 22 mg/dL (ref 8–23)
CO2: 26 mmol/L (ref 22–32)
Calcium: 8.6 mg/dL — ABNORMAL LOW (ref 8.9–10.3)
Chloride: 97 mmol/L — ABNORMAL LOW (ref 98–111)
Creatinine, Ser: 1.05 mg/dL (ref 0.61–1.24)
GFR, Estimated: >60 mL/min (ref >60)
Glucose, Bld: 119 mg/dL — ABNORMAL HIGH (ref 70–99)
Potassium: 4.2 mmol/L (ref 3.5–5.1)
Sodium: 132 mmol/L — ABNORMAL LOW (ref 135–145)
Total Bilirubin: 1.1 mg/dL (ref 0.3–1.2)
Total Protein: 6.4 g/dL — ABNORMAL LOW (ref 6.5–8.1)

## 2022-06-14 LAB — GLUCOSE, CAPILLARY
Glucose-Capillary: 114 mg/dL — ABNORMAL HIGH (ref 70–99)
Glucose-Capillary: 122 mg/dL — ABNORMAL HIGH (ref 70–99)
Glucose-Capillary: 111 mg/dL — ABNORMAL HIGH (ref 70–99)

## 2022-06-14 LAB — PHOSPHORUS: Phosphorus: 3.8 mg/dL (ref 2.5–4.6)

## 2022-06-14 LAB — MAGNESIUM: Magnesium: 2.3 mg/dL (ref 1.7–2.4)

## 2022-06-14 MED ORDER — SUCRALFATE 1 G PO TABS
1.0000 g | ORAL_TABLET | Freq: Three times a day (TID) | ORAL | 0 refills | Status: AC
  Filled 2022-06-14: qty 28, 7d supply, fill #0

## 2022-06-14 MED ORDER — ACETAMINOPHEN 325 MG PO TABS
650.0000 mg | ORAL_TABLET | Freq: Four times a day (QID) | ORAL | 0 refills | Status: AC | PRN
  Filled 2022-06-14: qty 20, 3d supply, fill #0

## 2022-06-14 MED ORDER — CEFDINIR 300 MG PO CAPS
300.0000 mg | ORAL_CAPSULE | Freq: Two times a day (BID) | ORAL | 0 refills | Status: AC
  Filled 2022-06-14: qty 4, 2d supply, fill #0

## 2022-06-14 MED ORDER — LISINOPRIL 10 MG PO TABS
10.0000 mg | ORAL_TABLET | Freq: Every day | ORAL | 11 refills | Status: DC
  Filled 2022-06-14: qty 30, 30d supply, fill #0

## 2022-06-14 MED ORDER — CEFDINIR 300 MG PO CAPS
300.0000 mg | ORAL_CAPSULE | Freq: Two times a day (BID) | ORAL | Status: DC
  Filled 2022-06-14 (×2): qty 1

## 2022-06-14 NOTE — Progress Notes (Signed)
Mobility Specialist - Progress Note   06/14/22 1041  Oxygen Therapy  SpO2 96 %  O2 Device Room Air  Mobility  Activity Ambulated with assistance in hallway  Level of Assistance Standby assist, set-up cues, supervision of patient - no hands on  Assistive Device Front wheel walker  Distance Ambulated (ft) 100 ft  Activity Response Tolerated well  Mobility Referral Yes  $Mobility charge 1 Mobility   Nurse requested Mobility Specialist to perform oxygen saturation test with pt which includes removing pt from oxygen both at rest and while ambulating.  Below are the results from that testing.     Patient Saturations on Room Air at Rest = spO2 96%  Patient Saturations on Room Air while Ambulating = sp02 96% .    At end of testing pt left in room on 1 Liters of oxygen.  Reported results to nurse.   Pt received in bed and agreed to walking O2 test. Pt claimed to feel SOB with quick labored breathing after session, however SpO2 was at 96%. Pt proceeded to put back on nasal cannula even after being told he doesn't need it.   Pt back in bed with all needs met.   Roderick Pee Mobility Specialist

## 2022-06-14 NOTE — Progress Notes (Signed)
Nutrition Brief Note  RD consulted for nutritional assessment.  Pt in room, states he is discharging today. In street clothes. Lunch tray sitting at bedside, encouraged pt to eat something prior to discharge.    Wt Readings from Last 15 Encounters:  06/14/22 99.3 kg  05/29/22 104 kg  05/20/22 104 kg  03/12/22 104 kg  03/03/22 104.3 kg  01/31/22 93.3 kg  01/18/22 93.3 kg  01/11/22 90.7 kg  12/05/21 91.6 kg  12/02/21 91.6 kg  10/11/21 90.7 kg  10/01/21 90.8 kg  11/16/18 90.8 kg  10/27/17 93.9 kg  10/20/17 94 kg    Body mass index is 30.53 kg/m. Patient meets criteria for obesity based on current BMI.   Current diet order is heart healthy/ CHO modified, patient is consuming approximately 100% of meals at this time. Labs and medications reviewed.   No nutrition interventions warranted at this time. If nutrition issues arise, please consult RD.   Clayton Bibles, MS, RD, LDN Inpatient Clinical Dietitian Contact information available via Amion

## 2022-06-14 NOTE — NC FL2 (Signed)
[place] LEVEL OF CARE FORM     IDENTIFICATION  Patient Name: Steven Simmons Birthdate: 1940/05/13 Sex: male Admission Date (Current Location): 06/09/2022  [facility] and [place] Number:      Facility and Address:  [facility],  [address] [place], [place]      Provider Number: [id number]  Attending Physician Name and Address:  Kerney Elbe, DO  Relative Name and Phone Number:   Miquel [address]-facility-Michelle Suella Broad)    Current Level of Care:   Recommended Level of Care:   Prior Approval Number:    Date Approved/Denied:   PASRR Number:    Discharge Plan: Other (Comment) (LTC)    Current Diagnoses: Patient Active Problem List   Diagnosis Date Noted   Acute cystitis 06/10/2022   Hypomagnesemia 06/10/2022   Mild protein malnutrition ([place]) 06/10/2022   Aspiration into airway 06/10/2022   COPD with acute exacerbation ([place]) 03/12/2022   Elevated brain natriuretic peptide (BNP) level 03/11/2022   Generalized weakness 03/11/2022   Housing instability 12/08/2021   Homelessness 12/08/2021   Financial insecurity 12/08/2021   Paroxysmal atrial fibrillation ([place]) 12/02/2021   Dyspnea on exertion 12/02/2021   Normocytic anemia 12/02/2021   Dyspnea 12/02/2021   Acute blood loss anemia    BRBPR (bright red blood per rectum) 10/01/2021   Atrial fibrillation with RVR ([place]) 10/01/2021   Prediabetes 11/16/2018   CAP (community acquired pneumonia) 08/23/2017   Appetite impaired 08/23/2017   Singultus, recent history 08/23/2017   Enlarged prostate on Pelvic CT 08/23/2017   Hyponatremia 08/22/2017   GERD (gastroesophageal reflux disease) 08/22/2017   Hypokalemia 09/24/2016   Erectile dysfunction 12/28/2013   HLD (hyperlipidemia) 08/11/2012   HTN (hypertension) 06/29/2012    Orientation RESPIRATION BLADDER Height & Weight     Self  Normal Incontinent Weight: 99.3 kg Height:  '5\' 11"'$  (180.3 cm)  BEHAVIORAL SYMPTOMS/MOOD NEUROLOGICAL BOWEL  NUTRITION STATUS      Incontinent Diet (Heart Healthy)  AMBULATORY STATUS COMMUNICATION OF NEEDS Skin   Extensive Assist   Normal                       Personal Care Assistance Level of Assistance  Bathing, Feeding, Dressing Bathing Assistance: Maximum assistance Feeding assistance: Maximum assistance Dressing Assistance: Maximum assistance     Functional Limitations Info  Sight, Hearing, Speech Sight Info: Adequate Hearing Info: Adequate Speech Info: Adequate    SPECIAL CARE FACTORS FREQUENCY                       Contractures Contractures Info: Not present    Additional Factors Info  Code Status, Allergies Code Status Info:  (DNR) Allergies Info:  (Ceclor (Cefaclor), Veralipride, Penicillins, Amlodipine, Antihistamines, Chlorpheniramine-type, Beta Adrenergic Blockers, Celebrex (Celecoxib), Ibuprofen, Lipitor (Atorvastatin), Nutrasweet Aspartame (Aspartame))           Current Medications (06/14/2022):  This is the current hospital active medication list Current Facility-Administered Medications  Medication Dose Route Frequency Provider Last Rate Last Admin   acetaminophen (TYLENOL) tablet 650 mg  650 mg Oral Q6H PRN Howerter, Justin B, DO   650 mg at 06/12/22 2337   Or   acetaminophen (TYLENOL) suppository 650 mg  650 mg Rectal Q6H PRN Howerter, Justin B, DO       cefdinir (OMNICEF) capsule 300 mg  300 mg Oral Q12H Sheikh, Omair Latif, DO       Chlorhexidine Gluconate Cloth 2 % PADS 6 each  6  each Topical Daily Howerter, Justin B, DO   6 each at 06/14/22 0926   famotidine (PEPCID) tablet 20 mg  20 mg Oral BID Raiford Noble [place], DO   20 mg at 06/14/22 [id number]   hydrALAZINE (APRESOLINE) injection 10 mg  10 mg Intravenous Q6H PRN Raiford Noble Latif, DO   10 mg at 06/11/22 1250   insulin aspart (novoLOG) injection 0-15 Units  0-15 Units Subcutaneous TID WC Reubin Milan, MD   2 Units at 06/13/22 1314   melatonin tablet 6 mg  6 mg Oral QHS PRN Raenette Rover, NP       metoprolol tartrate (LOPRESSOR) injection 5 mg  5 mg Intravenous Q4H PRN Howerter, Justin B, DO       ondansetron (ZOFRAN) injection 4 mg  4 mg Intravenous Q6H PRN Raenette Rover, NP       Oral care mouth rinse  15 mL Mouth Rinse 4 times per day Howerter, Justin B, DO   15 mL at 06/14/22 0816   Oral care mouth rinse  15 mL Mouth Rinse PRN Howerter, Justin B, DO       pantoprazole (PROTONIX) injection 40 mg  40 mg Intravenous Q24H Sheikh, Georgina Quint [place], DO   40 mg at 06/14/22 0925   sucralfate (CARAFATE) tablet 1 g  1 g Oral TID WC & HS Raiford Noble [place], DO   1 g at 06/14/22 [id number]     Discharge Medications: Please see discharge summary for a list of discharge medications.  Relevant Imaging Results:  Relevant Lab Results:   Additional Information SSN: [id number]  Dessa Phi, RN

## 2022-06-14 NOTE — Discharge Summary (Addendum)
Physician Discharge Summary   Patient: Steven Simmons MRN: [id number] DOB: 06/01/1940  Admit date:     06/09/2022  Discharge date: 06/14/22  Discharge Physician: Raiford Noble, DO    PCP: [name], MD   Recommendations at discharge:   Follow up with PCP within 1-2 weeks and repeat CBC, CMP, Mag, Phos within 1 week Repeat CXR in 3-6 weeks Follow-up with cardiology in outpatient setting and continue discussions about anticoagulation   Discharge Diagnoses: Principal Problem:   Acute cystitis Active Problems:   HTN (hypertension)   HLD (hyperlipidemia)   Hypokalemia   Hyponatremia   GERD (gastroesophageal reflux disease)   Prediabetes   Paroxysmal atrial fibrillation (HCC)   Hypomagnesemia   Mild protein malnutrition (HCC)   Aspiration into airway  Resolved Problems:   * No resolved hospital problems. [facility] Course: The patient is an 82 year old elderly Caucasian male with a past medical history significant for but not limited to allergies, osteoarthritis, aspiration pneumonia, peptic ulcer disease, history of upper GI bleed and history of lower GI bleed, cataracts, GERD, history of heart murmur as a child, history of TIA, hypertension, migraine headaches, history of sepsis due to UTI as well as recent pneumonia presented to the ED from EMS via [facility] for frontal mild headache and had not responded to treatment.  He was disoriented unable to provide a meaningful HPI but was able to answer simple questions.  Further workup revealed that he had a temperature 99.3 a pulse of 109, respirations of 20 and significantly elevated blood pressure 206/100.  He received acetaminophen, diltiazem, hydralazine, Benadryl, metoclopramide, lorazepam, 3 L of lactated Ringer boluses as well as vancomycin and metronidazole as well as cefepime.   Laboratory data showed that he had a stable white count but there is concern for urinary tract infection.  Head CT without contrast was done and  showed atrophy and chronic microvascular disease but no acute intracranial abnormality.  Admitted for further workup of a urinary tract infection and aspiration   **Interim History Urinary tract infection is being treated and antibiotics have been de-escalated.  Sodium still remains on the low side and nursing reports intermittent confusion however on my evaluation he appears awake and alert and oriented.  Will need PT OT to further evaluate and treat and this is pending. His Mentation is improved but nursing states he was still a little confused. Na+ remains on the lower side but is slowly improving.  PT and OT recommending long-term institutional care without follow-up therapy with returning to SNF.   Patient is slowly improving but chest x-ray today showed possible mediastinum so a CT scan was obtained which showed no evidence of mediastinum.  Sodium continues to improve and patient is stable for discharge given that he is improved to his baseline.  He did not desaturate and will need close PCP follow-up within 1 to 2 weeks.  Assessment and Plan:  Acute cystitis and E. coli UTI, improving  -Admitted to SDU/inpatient and improved so transitioned to Floor  -Urinalysis showed a hazy appearance with small hemoglobin, large leukocytes, negative nitrates, few bacteria, greater than 50 WBCs and urine culture growing greater than 100,000 colony-forming units of E. coli which sensitivities were pansensitive and only resistant to ciprofloxacin so antibiotics will be de-escalated and continue on cefazolin as below -Discontinued IV fluids with NS at 75 mL/hr and will continue monitor -Continue cefepime 2 g every 8 hours but changed to IV cefazolin and will further de-escalate to po Cefdinir for  D/C for total of 7 days of Abx -Follow-up blood culture and sensitivity; Blood Cx show NGTD at 3 Days -Follow CBC and CMP and repeat within 1 week    Aspiration into airway  -Negative chest chest radiograph. -No  changes seen oxygen saturation. -SLP consult and they recommending resuming a regular diet with thin liquids and try meds with whole pure have recommended no follow-up needs identified -Initial DG Chest X-Ray done and showed "Cardiac shadow is enlarged but stable. Aortic calcifications are noted. The lungs are well aerated bilaterally. No focal infiltrate or effusion is seen. No bony abnormality is noted. Slight increase in central vascular congestion is noted without edema." -Repeat CXR yesterday AM done and showed "The cardiomediastinal silhouette is unchanged. Elevation of RIGHT hemidiaphragm again noted. There is no evidence of focal airspace disease, pulmonary edema, suspicious pulmonary nodule/mass, pleural effusion, or pneumothorax. No acute bony abnormalities are identified." -Repeat chest x-ray done today and showed "Bibasilar atelectasis. Linear lucency adjacent to the spine overlying the upper mediastinum, likely related to rotation although pneumomediastinum cannot be excluded. CT chest recommended to further evaluate." -CT Scan of the Chest w/o Contrast done and showed "No evidence of  pneumomediastinum. Small right and trace left pleural effusions with associated compressive atelectasis. Mild smooth interlobular septal thickening, suggesting mild pulmonary edema. Mildly dilated central pulmonary vasculature, suggesting pulmonary arterial hypertension. Aortic and coronary artery atherosclerosis." -Ambulatory home O2 screen done prior to discharge and he did not desaturate but he wears 1 to 5 L at his nursing facility. -Repeat chest x-ray in 3 to 6 weeks and continue cefdinir as above   HTN (Hypertension) -Blood pressures have been soft. -Continuing to Hold Diltiazem and, lisinopril and HCTZ for now; resume diltiazem and lisinopril but discontinue hydrochlorothiazide on the [facility] given his hyponatremia -Continue to Monitor BP per Protocol -Last BP reading was 143/96   Paroxysmal Atrial  Fibrillation (HCC) -CHA2DS2-VASc Score of at least 7. -Not on anticoagulation. -Was Holding Cardizem due to soft BP. -Continue with metoprolol tartrate 5 mg IV Q4 as needed for sustained heart rate greater than 130 for 3 doses -Heart rates have improved and will resume his home diltiazem she will need to follow-up with PCP and cardiology in outpatient setting.   Hypophosphatemia -Phos Level Trend: Recent Labs  Lab 06/12/22 0541 06/13/22 0550 06/14/22 0421  PHOS 2.3* 3.3 3.8  -Continue to Replete as Necessary and repeat Phos Level in the AM    HLD (Hyperlipidemia) -No therapy currently listed. -Follow-up with primary.   Mild Protein Malnutrition ([place]) -C/w Protein supplementation. -Obtain dietitian and nutritionist evaluation    Pre-Diabetes -Carbohydrate modified diet. -Continue with moderate NovoLog sliding scale insulin before meals and CBG monitoring before meals and bedtime. -CBG's ranging from 114-159   Hyponatremia -Na+ Trend: Recent Labs  Lab 05/29/22 1730 06/09/22 2110 06/10/22 0230 06/11/22 0311 06/12/22 0541 06/13/22 0550 06/14/22 0421  NA 130* 128* 126* 127* 125* 128* 132*  -IVF now stopped; Urine Osm was 505 and Urine Sodium was 28 -Discontinue hydrochlorothiazide at discharge -Continue to Monitor and Trend and repeat CMP in the AM    GERD (gastroesophageal reflux disease)/GI Prophylaxis -Continued pantoprazole 40 mg p.o. daily and resume at D/C BID given Reflux -Patient requesting Carafate that this has been added back and also requesting Zantac but no changes to famotidine twice daily   Hypokalemia -Patient's K+ Level Trend: Recent Labs  Lab 05/29/22 1730 06/09/22 2110 06/10/22 0230 06/11/22 0311 06/12/22 0541 06/13/22 0550 06/14/22 0421  K 5.1  3.4* 3.3* 3.0* 3.6 3.4* 4.2  -Continue to Monitor and Replete as Necessary -Repeat CMP in the AM    Hypomagnesemia, improved  -Patient's Mag Level Trend: Recent Labs  Lab 06/10/22 0428  06/11/22 0311 06/12/22 0541 06/13/22 0550 06/14/22 0421  MG 1.5* 2.1 2.1 2.0 2.3  -Continue to Monitor and Replete as Necessary -Repeat Mag in the AM    Normocytic Anemia -Hgb/Hct trend: Recent Labs  Lab 05/29/22 1730 06/09/22 2110 06/10/22 0230 06/11/22 0311 06/12/22 0541 06/13/22 0550 06/14/22 0421  HGB 13.3 13.3 13.2 11.5* 11.3* 10.5* 11.2*  HCT 41.6 40.6 39.9 35.1* 34.4* 31.9* 35.0*  MCV 86.1 85.1 84.5 85.6 84.9 85.8 87.3  -Checked Anemia Panel and showed an iron level of 42, UIBC of 172, TIBC of 214, saturation ratios of 20, ferritin level 355, folate 15.0, vitamin B12 level 372 -Continue to Monitor for S/Sx of Bleeding; No overt bleeding noted -Repeat CBC in the AM    Hypoalbuminemia -Patient's Albumin Trend: Recent Labs  Lab 05/29/22 1730 06/09/22 2110 06/10/22 0230 06/11/22 0311 06/12/22 0541 06/13/22 0550 06/14/22 0421  ALBUMIN 3.7 3.3* 3.4* 2.8* 2.7* 2.6* 2.9*  -Continue to Monitor and Trend and repeat CMP in the AM   Obesity -Complicates overall prognosis and care -Estimated body mass index is 30.53 kg/m as calculated from the following:   Height as of this encounter: '5\' 11"'$  (1.803 m).   Weight as of this encounter: 99.3 kg.  -Weight Loss and Dietary Counseling given  Consultants: None Procedures performed: None  Disposition: Skilled nursing facility Diet recommendation:  Cardiac and Carb modified diet DISCHARGE MEDICATION: Allergies as of 06/14/2022       Reactions   Ceclor [cefaclor] Other (See Comments)   "Makes my heart slow and I feel like I am fading away"   Veralipride Other (See Comments)   Gums bleed, tooth aches   Penicillins Rash, Other (See Comments)   Has patient had a PCN reaction causing immediate rash, facial/tongue/throat swelling, SOB or lightheadedness with hypotension: yes- only rash Has patient had a PCN reaction causing severe rash involving mucus membranes or skin necrosis: No Has patient had a PCN reaction that  required hospitalization: No Has patient had a PCN reaction occurring within the last 10 years: No If all of the above answers are "NO", then may proceed with Cephalosporin use.   Amlodipine Other (See Comments)   Gum bleeding   Antihistamines, Chlorpheniramine-type Other (See Comments)   Dizziness and nose bleeds. "Spaced out"    Beta Adrenergic Blockers Other (See Comments)   Gums bleed   Celebrex [celecoxib] Other (See Comments)   Stomach pain   Ibuprofen Other (See Comments)   Stomach pain     Lipitor [atorvastatin] Hypertension   Nutrasweet Aspartame [aspartame] Other (See Comments)   Memory loss. "Spaced out"         Medication List     STOP taking these medications    levofloxacin 750 MG tablet Commonly known as: Levaquin   lisinopril-hydrochlorothiazide 10-12.5 MG tablet Commonly known as: ZESTORETIC   methylPREDNISolone 4 MG Tbpk tablet Commonly known as: MEDROL DOSEPAK   potassium chloride SA 20 MEQ tablet Commonly known as: KLOR-CON M       TAKE these medications    acetaminophen 325 MG tablet Commonly known as: TYLENOL Take 2 tablets (650 mg total) by mouth every 6 (six) hours as needed for mild pain (or Fever >/= 101). What changed:  medication strength how much to take when to take this  reasons to take this   cefdinir 300 MG capsule Commonly known as: OMNICEF Take 1 capsule (300 mg total) by mouth every 12 (twelve) hours for 2 days.   diltiazem 120 MG 24 hr capsule Commonly known as: CARDIZEM CD Take 1 capsule (120 mg total) by mouth daily.   eucerin cream Apply 1 Application topically See admin instructions. Apply to the face in the morning and at bedtime   ipratropium-albuterol 0.5-2.5 (3) MG/3ML Soln Commonly known as: DUONEB Take 3 mLs by nebulization 2 (two) times daily as needed (for shortness of breath, coughing, or wheezing).   lisinopril 10 MG tablet Commonly known as: ZESTRIL Take 1 tablet (10 mg total) by mouth daily.    loperamide 2 MG tablet Commonly known as: IMODIUM A-D Take 4 mg by mouth See admin instructions. Take 4 mg by mouth at onset of diarrhea, then 2 mg after each subsequent stool- not to exceed 16 grams/24 hours; call MD if no relief   OXYGEN Inhale 1-5 L/min into the lungs See admin instructions. 1-5 L/min via nasal cannula and may titrate to keep sats >90%   pantoprazole 40 MG tablet Commonly known as: Protonix Take 1 tablet (40 mg total) by mouth daily. What changed: when to take this   polyethylene glycol powder 17 GM/SCOOP powder Commonly known as: GLYCOLAX/MIRALAX Take 1 Container by mouth daily as needed for mild constipation (to be mixed into 8 ounces of water).   sucralfate 1 g tablet Commonly known as: CARAFATE Take 1 tablet (1 g total) by mouth 4 (four) times daily -  with meals and at bedtime.   Tums 500 MG chewable tablet Generic drug: calcium carbonate Chew 2 tablets by mouth 3 (three) times daily as needed for indigestion.   Zantac 360 10 MG tablet Generic drug: famotidine Take 10 mg by mouth in the morning.       Discharge Exam: Filed Weights   06/10/22 0515 06/11/22 0500 06/14/22 0500  Weight: 92.2 kg 99.5 kg 99.3 kg   Vitals:   06/14/22 1030 06/14/22 1041  BP:    Pulse:    Resp: (!) 26 (!) 27  Temp:    SpO2:  96%   Examination: Physical Exam:  Constitutional: WN/WD obese chronically ill-appearing elderly Caucasian male in no acute distress Respiratory: Diminished to auscultation bilaterally with coarse breath sounds, no wheezing, rales, rhonchi or crackles. Normal respiratory effort and patient is not tachypenic. No accessory muscle use.  Unlabored breathing and was wearing supplemental oxygen via nasal Cardiovascular: RRR, no murmurs / rubs / gallops. S1 and S2 auscultated.  Trace extremity edema Abdomen: Soft, non-tender, distended secondary to body habitus. Bowel sounds positive.  GU: Deferred. Musculoskeletal: No clubbing / cyanosis of  digits/nails. No joint deformity upper and lower extremities. Skin: No rashes limited skin evaluation but has some lesions noted scattered throughout his body and 1 on his face and between his eyebrows. No induration; Warm and dry.  Neurologic: CN 2-12 grossly intact with no focal deficits.  Romberg sign cerebellar reflexes not assessed.  Psychiatric: Normal judgment and insight. Alert and awake and has a normal mood and affect.  Condition at discharge: stable  The results of significant diagnostics from this hospitalization (including imaging, microbiology, ancillary and laboratory) are listed below for reference.   Imaging Studies: CT CHEST WO CONTRAST  Result Date: 06/13/2022 CLINICAL DATA:  Shortness of breath. Concern for pneumomediastinum on x-ray EXAM: CT CHEST WITHOUT CONTRAST TECHNIQUE: Multidetector CT imaging of the chest was performed following  the standard protocol without IV contrast. RADIATION DOSE REDUCTION: This exam was performed according to the departmental dose-optimization program which includes automated exposure control, adjustment of the mA and/or kV according to patient size and/or use of iterative reconstruction technique. COMPARISON:  X-ray 06/13/2022.  CT 03/03/2022 FINDINGS: Cardiovascular: Heart size is mildly enlarged. No pericardial effusion. Thoracic aorta is nonaneurysmal. Atherosclerotic calcifications of the aorta and coronary arteries. There also calcifications of the aortic valve and mitral annulus. Central pulmonary vasculature is mildly dilated. Mediastinum/Nodes: No pneumomediastinum. No mediastinal fluid collection or hematoma. Stable mildly enlarged lower right paratracheal lymph node measuring 12 mm short axis (series 2, image 45). No new or enlarging mediastinal lymph nodes. No axillary or hilar lymphadenopathy. Thyroid, trachea, and esophagus demonstrate no significant findings. Lungs/Pleura: Small right and trace left pleural effusions with associated  compressive atelectasis. Mild smooth interlobular septal thickening. No additional alveolar airspace opacities. No pneumothorax. Upper Abdomen: No acute abnormality. Musculoskeletal: No chest wall mass or suspicious bone lesions identified. Diffuse ankylosis of the thoracic spine. IMPRESSION: 1. No evidence of pneumomediastinum. 2. Small right and trace left pleural effusions with associated compressive atelectasis. 3. Mild smooth interlobular septal thickening, suggesting mild pulmonary edema. 4. Mildly dilated central pulmonary vasculature, suggesting pulmonary arterial hypertension. 5. Aortic and coronary artery atherosclerosis (ICD10-I70.0). Electronically Signed   By: Davina Poke D.O.   On: 06/13/2022 12:26   DG CHEST PORT 1 VIEW  Result Date: 06/13/2022 CLINICAL DATA:  Shortness of breath. EXAM: PORTABLE CHEST 1 VIEW COMPARISON:  06/12/2022 FINDINGS: Bibasilar atelectasis noted. The cardio pericardial silhouette is enlarged. Both AP films of this study a rotated to the left. There is linear lucency adjacent to the spine overlying the upper mediastinum, likely related to rotation although pneumomediastinum cannot be excluded. Bones are diffusely demineralized. Telemetry leads overlie the chest. IMPRESSION: 1. Bibasilar atelectasis. 2. Linear lucency adjacent to the spine overlying the upper mediastinum, likely related to rotation although pneumomediastinum cannot be excluded. CT chest recommended to further evaluate. These results will be called to the ordering clinician or representative by the Radiologist Assistant, and communication documented in the PACS or [employer]. Electronically Signed   By: Misty Stanley M.D.   On: 06/13/2022 10:07   DG CHEST PORT 1 VIEW  Result Date: 06/12/2022 CLINICAL DATA:  Shortness of breath. EXAM: PORTABLE CHEST 1 VIEW COMPARISON:  06/10/2022 prior studies FINDINGS: The cardiomediastinal silhouette is unchanged. Elevation of RIGHT hemidiaphragm again noted.  There is no evidence of focal airspace disease, pulmonary edema, suspicious pulmonary nodule/mass, pleural effusion, or pneumothorax. No acute bony abnormalities are identified. IMPRESSION: No active disease. Electronically Signed   By: Margarette Canada M.D.   On: 06/12/2022 08:35   DG Chest Port 1 View  Result Date: 06/10/2022 CLINICAL DATA:  Recent aspiration EXAM: PORTABLE CHEST 1 VIEW COMPARISON:  Film from earlier in the same day. FINDINGS: Cardiac shadow is enlarged but stable. Aortic calcifications are noted. The lungs are well aerated bilaterally. No focal infiltrate or effusion is seen. No bony abnormality is noted. Slight increase in central vascular congestion is noted without edema. IMPRESSION: Mild vascular congestion.  No other focal abnormality is noted. Electronically Signed   By: Inez Catalina M.D.   On: 06/10/2022 21:59   DG CHEST PORT 1 VIEW  Result Date: 06/10/2022 CLINICAL DATA:  Aspiration. EXAM: PORTABLE CHEST 1 VIEW COMPARISON:  Chest x-ray from yesterday. FINDINGS: Unchanged mild cardiomegaly. No focal consolidation, pleural effusion, or pneumothorax. No acute osseous abnormality. IMPRESSION: No active disease.  Electronically Signed   By: Titus Dubin M.D.   On: 06/10/2022 09:40   DG Chest Port 1 View  Result Date: 06/09/2022 CLINICAL DATA:  Nausea EXAM: PORTABLE CHEST 1 VIEW COMPARISON:  05/29/2022 FINDINGS: Cardiac shadow is within normal limits. Aortic calcifications are again seen. Persistent left retrocardiac density is noted without significant increase. No new focal infiltrate is noted. No bony abnormality is seen. IMPRESSION: Stable left retrocardiac opacity. Electronically Signed   By: Inez Catalina M.D.   On: 06/09/2022 21:28   CT HEAD WO CONTRAST (5MM)  Result Date: 06/09/2022 CLINICAL DATA:  Headache EXAM: CT HEAD WITHOUT CONTRAST TECHNIQUE: Contiguous axial images were obtained from the base of the skull through the vertex without intravenous contrast. RADIATION DOSE  REDUCTION: This exam was performed according to the departmental dose-optimization program which includes automated exposure control, adjustment of the mA and/or kV according to patient size and/or use of iterative reconstruction technique. COMPARISON:  07/28/2015 FINDINGS: Brain: There is atrophy and chronic small vessel disease changes. No acute intracranial abnormality. Specifically, no hemorrhage, hydrocephalus, mass lesion, acute infarction, or significant intracranial injury. Vascular: No hyperdense vessel or unexpected calcification. Skull: No acute calvarial abnormality. Sinuses/Orbits: No acute findings Other: None IMPRESSION: Atrophy, chronic microvascular disease. No acute intracranial abnormality. Electronically Signed   By: Rolm Baptise M.D.   On: 06/09/2022 21:11   DG Chest 2 View  Result Date: 05/29/2022 CLINICAL DATA:  Epigastric/chest pain. EXAM: CHEST - 2 VIEW COMPARISON:  05/20/2022 FINDINGS: Improving left basilar retrocardiac opacity. No new airspace disease. Stable heart size and mediastinal contours. No pulmonary edema. No pleural fluid or pneumothorax. Stable osseous structures. IMPRESSION: Improving left basilar retrocardiac opacity. No acute findings. Electronically Signed   By: Keith Rake M.D.   On: 05/29/2022 17:12   DG Chest Portable 1 View  Result Date: 05/20/2022 CLINICAL DATA:  Cough EXAM: PORTABLE CHEST 1 VIEW COMPARISON:  Chest x-ray dated April 14, 2022 FINDINGS: The heart size and mediastinal contours are within normal limits. New mild retrocardiac opacity. The visualized skeletal structures are unremarkable. IMPRESSION: New mild retrocardiac opacity, which may represent atelectasis or infection/aspiration. Recommend follow-up PA and lateral chest x-ray in 6-8 weeks to ensure resolution. Electronically Signed   By: Yetta Glassman M.D.   On: 05/20/2022 13:43    Microbiology: Results for orders placed or performed during the hospital encounter of 06/09/22   Urine Culture (for pregnant, neutropenic or urologic patients or patients with an indwelling urinary catheter)     Status: Abnormal   Collection Time: 06/09/22 11:33 PM   Specimen: Urine, Clean Catch  Result Value Ref Range Status   Specimen Description   Final    URINE, CLEAN CATCH Performed at [facility], [place] [address], [place],  [address]    Special Requests   Final    NONE Performed at [facility], [place] [address], [place], [place] [address]    Culture >=100,000 COLONIES/mL ESCHERICHIA COLI (A)  Final   Report Status 06/12/2022 FINAL  Final   Organism ID, Bacteria ESCHERICHIA COLI (A)  Final      Susceptibility   Escherichia coli - MIC*    AMPICILLIN 8 SENSITIVE Sensitive     CEFAZOLIN <=4 SENSITIVE Sensitive     CEFEPIME <=0.12 SENSITIVE Sensitive     CEFTRIAXONE <=0.25 SENSITIVE Sensitive     CIPROFLOXACIN >=4 RESISTANT Resistant     GENTAMICIN <=1 SENSITIVE Sensitive     IMIPENEM <=0.25 SENSITIVE Sensitive  NITROFURANTOIN <=16 SENSITIVE Sensitive     TRIMETH/SULFA <=20 SENSITIVE Sensitive     AMPICILLIN/SULBACTAM 4 SENSITIVE Sensitive     PIP/TAZO <=4 SENSITIVE Sensitive     * >=100,000 COLONIES/mL ESCHERICHIA COLI  Blood culture (routine x 2)     Status: None (Preliminary result)   Collection Time: 06/10/22  2:30 AM   Specimen: BLOOD  Result Value Ref Range Status   Specimen Description   Final    BLOOD BLOOD LEFT FOREARM Performed at [place] [address] [place], [place] [address]    Special Requests   Final    BOTTLES DRAWN AEROBIC AND ANAEROBIC Blood Culture adequate volume Performed at [facility] [address], [place], [place] [address]    Culture   Final    NO GROWTH 3 DAYS Performed at [facility], [place] [address] [place], [place] [address]    Report Status PENDING  Incomplete  Resp panel by RT-PCR (RSV, Flu A&B, Covid) Anterior Nasal Swab      Status: None   Collection Time: 06/10/22  2:44 AM   Specimen: Anterior Nasal Swab  Result Value Ref Range Status   SARS Coronavirus 2 by RT PCR NEGATIVE NEGATIVE Final    Comment: (NOTE) SARS-CoV-2 target nucleic acids are NOT DETECTED.  The SARS-CoV-2 RNA is generally detectable in upper respiratory specimens during the acute phase of infection. The lowest concentration of SARS-CoV-2 viral copies this assay can detect is 138 copies/mL. A negative result does not preclude SARS-Cov-2 infection and should not be used as the sole basis for treatment or other patient management decisions. A negative result may occur with  improper specimen collection/handling, submission of specimen other than nasopharyngeal swab, presence of viral mutation(s) within the areas targeted by this assay, and inadequate number of viral copies(<138 copies/mL). A negative result must be combined with clinical observations, patient history, and epidemiological information. The expected result is Negative.  Fact Sheet for Patients:  EntrepreneurPulse.com.au  Fact Sheet for Healthcare Providers:  IncredibleEmployment.be  This test is no t yet approved or cleared by the [place] FDA and  has been authorized for detection and/or diagnosis of SARS-CoV-2 by FDA under an Emergency Use Authorization (EUA). This EUA will remain  in effect (meaning this test can be used) for the duration of the COVID-19 declaration under Section 564(b)(1) of the Act, 21 U.S.C.section 360bbb-3(b)(1), unless the authorization is terminated  or revoked sooner.       Influenza A by PCR NEGATIVE NEGATIVE Final   Influenza B by PCR NEGATIVE NEGATIVE Final    Comment: (NOTE) The Xpert Xpress SARS-CoV-2/FLU/RSV plus assay is intended as an aid in the diagnosis of influenza from Nasopharyngeal swab specimens and should not be used as a sole basis for treatment. Nasal washings and aspirates are  unacceptable for Xpert Xpress SARS-CoV-2/FLU/RSV testing.  Fact Sheet for Patients: EntrepreneurPulse.com.au  Fact Sheet for Healthcare Providers: IncredibleEmployment.be  This test is not yet approved or cleared by the [place] FDA and has been authorized for detection and/or diagnosis of SARS-CoV-2 by FDA under an Emergency Use Authorization (EUA). This EUA will remain in effect (meaning this test can be used) for the duration of the COVID-19 declaration under Section 564(b)(1) of the Act, 21 U.S.C. section 360bbb-3(b)(1), unless the authorization is terminated or revoked.     Resp Syncytial Virus by PCR NEGATIVE NEGATIVE Final    Comment: (NOTE) Fact Sheet for Patients: EntrepreneurPulse.com.au  Fact Sheet for Healthcare  Providers: IncredibleEmployment.be  This test is not yet approved or cleared by the [place] and has been authorized for detection and/or diagnosis of SARS-CoV-2 by FDA under an Emergency Use Authorization (EUA). This EUA will remain in effect (meaning this test can be used) for the duration of the COVID-19 declaration under Section 564(b)(1) of the Act, 21 U.S.C. section 360bbb-3(b)(1), unless the authorization is terminated or revoked.  Performed at [facility], [place] [address], [place], [place] [address]   Blood culture (routine x 2)     Status: None (Preliminary result)   Collection Time: 06/10/22  3:13 AM   Specimen: BLOOD RIGHT WRIST  Result Value Ref Range Status   Specimen Description   Final    BLOOD RIGHT WRIST Performed at [facility] [address] [place], [place] [address]    Special Requests   Final    BOTTLES DRAWN AEROBIC AND ANAEROBIC Blood Culture adequate volume Performed at [place] [address], [place], [place] [address]    Culture   Final    NO GROWTH 3 DAYS Performed at  [facility], [place] [address] [place], [place] [address]    Report Status PENDING  Incomplete  MRSA Next Gen by PCR, Nasal     Status: None   Collection Time: 06/10/22  5:35 AM   Specimen: Nasal Mucosa; Nasal Swab  Result Value Ref Range Status   MRSA by PCR Next Gen NOT DETECTED NOT DETECTED Final    Comment: (NOTE) The GeneXpert MRSA Assay (FDA approved for NASAL specimens only), is one component of a comprehensive MRSA colonization surveillance program. It is not intended to diagnose MRSA infection nor to guide or monitor treatment for MRSA infections. Test performance is not FDA approved in patients less than 61 years old. Performed at [facility], [place] [address] [place], [place] [address]   C Difficile Quick Screen w PCR reflex     Status: None   Collection Time: 06/11/22  4:11 PM   Specimen: STOOL  Result Value Ref Range Status   C Diff antigen NEGATIVE NEGATIVE Final   C Diff toxin NEGATIVE NEGATIVE Final   C Diff interpretation No C. difficile detected.  Final    Comment: Performed at [facility], [place] [address], [place], [place] [address]   Labs: CBC: Recent Labs  Lab 06/10/22 0230 06/11/22 0311 06/12/22 0541 06/13/22 0550 06/14/22 0421  WBC 8.2 10.3 7.7 6.3 8.3  NEUTROABS 7.0 8.6* 6.3 4.4 5.9  HGB 13.2 11.5* 11.3* 10.5* 11.2*  HCT 39.9 35.1* 34.4* 31.9* 35.0*  MCV 84.5 85.6 84.9 85.8 87.3  PLT 262 217 205 229 [id number]   Basic Metabolic Panel: Recent Labs  Lab 06/10/22 0230 06/10/22 0428 06/11/22 0311 06/12/22 0541 06/13/22 0550 06/14/22 0421  NA 126*  --  127* 125* 128* 132*  K 3.3*  --  3.0* 3.6 3.4* 4.2  CL 93*  --  95* 94* 98 97*  CO2 24  --  '24 23 24 26  '$ GLUCOSE 144*  --  112* 153* 128* 119*  BUN 20  --  '16 22 23 22  '$ CREATININE 1.00  --  0.76 0.86 0.77 1.05  CALCIUM 8.5*  --  7.9* 8.2* 8.2* 8.6*  MG  --  1.5* 2.1 2.1 2.0 2.3  PHOS  --   --   --  2.3* 3.3 3.8   Liver Function Tests: Recent Labs  Lab  06/10/22 0230 06/11/22 0311 06/12/22 0541 06/13/22 0550 06/14/22 0421  AST 25 29 33 26 37  ALT '16 19 24 24 19  '$ ALKPHOS 73 65 74 81 82  BILITOT 0.9 0.9 1.0 0.5 1.1  PROT 7.3 6.4* 6.0* 6.2* 6.4*  ALBUMIN 3.4* 2.8* 2.7* 2.6* 2.9*   CBG: Recent Labs  Lab 06/13/22 1120 06/13/22 1602 06/13/22 2028 06/14/22 0740 06/14/22 1211  GLUCAP 131* 114* 159* 114* 122*   Discharge time spent: greater than 30 minutes.  Signed: Raiford Noble, DO [facility] 06/14/2022

## 2022-06-14 NOTE — TOC Transition Note (Signed)
Transition of Care [facility]) - CM/SW Discharge Note   Patient Details  Name: Steven Simmons MRN: [id number] Date of Birth: 25-Mar-1941  [facility] CM/SW Contact:  Dessa Phi, RN Phone Number: 06/14/2022, 3:00 PM   Clinical Narrative:  Patient returning back to [place], insure DNR signed in shadow chart. going to rm#[id number],report tel#[phone], [facility] called.     Final next level of care: Long Term Nursing Home Barriers to Discharge: No Barriers Identified   Patient Goals and CMS Choice      Discharge Placement                Patient chooses bed at: [facility] Patient to be transferred to facility by:  Corey Harold) Name of family member notified:  [address] rep.) Patient and family notified of of transfer: 06/14/22  Discharge Plan and Services Additional resources added to the After Visit Summary for   In-house Referral: NA Discharge Planning Services: CM Consult Post Acute Care Choice: NA          DME Arranged: N/A DME Agency: NA       HH Arranged: NA HH Agency: NA        Social Determinants of Health (SDOH) Interventions SDOH Screenings   Food Insecurity: No Food Insecurity (06/12/2022)  Housing: [place]  (06/12/2022)  Transportation Needs: Unmet Transportation Needs (06/12/2022)  Utilities: At Risk (06/12/2022)  Depression (PHQ2-9): Low Risk  (01/18/2022)  Tobacco Use: Medium Risk (06/10/2022)     Readmission Risk Interventions    06/11/2022    1:17 PM  Readmission Risk Prevention Plan  Transportation Screening Complete  PCP or Specialist Appt within 3-5 Days Complete  HRI or [place] Complete  Social Work Consult for [place] Planning/Counseling Complete  Medication Review Press photographer) Complete

## 2022-06-14 NOTE — Care Management Important Message (Signed)
Important Message  Patient Details  Name: Steven Simmons MRN: [id number] Date of Birth: 1940-12-27   Medicare Important Message Given:  Yes     Memory Argue 06/14/2022, 11:54 AM

## 2022-06-14 NOTE — Progress Notes (Signed)
Report called to [place] at [facility] and all questions were answered.

## 2022-06-15 LAB — CULTURE, BLOOD (ROUTINE X 2)
Culture: NO GROWTH
Culture: NO GROWTH
Special Requests: ADEQUATE
Special Requests: ADEQUATE

## 2022-10-22 NOTE — Progress Notes (Signed)
 The patient is an 82 year old male who is here to talk about an ear concern.  Approximately one month ago, a caregiver at his rehab facility identified an accumulation of earwax in the patient's right ear. The patient believes his hearing is somewhat decreased making him have to listen closely. He experiences a persistent buzzing sensation in his left ear.   Right ear has some wax blocking view of the eardrum. Left ear has a medium amount of wax.   Ear Cerumen Removal  Date/Time: 10/22/2022 7:26 PM  Performed by: Vaughan Alm Ricker, MD Authorized by: Vaughan Alm Ricker, MD   Procedure Details:  Impacted cerumen: Yes Location details: right Procedure type: curette  Post Procedure Details:  Procedure findings: complete impaction removal Patient tolerance of procedure: patient tolerated the procedure without difficulty     1. Right cerumen impaction. The patient's right ear was cleared of obstructing cerumen.  The ears are healthy-appearing otherwise.

## 2022-10-30 ENCOUNTER — Emergency Department (HOSPITAL_COMMUNITY)
Admission: EM | Admit: 2022-10-30 | Discharge: 2022-10-30 | Disposition: A | Discharge status: Home / Self Care | Payer: Medicare PPO | Source: Home / Self Care | Attending: Emergency Medicine | Admitting: Emergency Medicine

## 2022-10-30 ENCOUNTER — Encounter (HOSPITAL_COMMUNITY): Payer: Self-pay

## 2022-10-30 ENCOUNTER — Other Ambulatory Visit: Payer: Self-pay

## 2022-10-30 ENCOUNTER — Emergency Department (HOSPITAL_COMMUNITY): Payer: Medicare PPO

## 2022-10-30 DIAGNOSIS — K5790 Diverticulosis of intestine, part unspecified, without perforation or abscess without bleeding: Secondary | ICD-10-CM

## 2022-10-30 DIAGNOSIS — R1084 Generalized abdominal pain: Secondary | ICD-10-CM | POA: Diagnosis present

## 2022-10-30 DIAGNOSIS — K5730 Diverticulosis of large intestine without perforation or abscess without bleeding: Secondary | ICD-10-CM | POA: Diagnosis not present

## 2022-10-30 DIAGNOSIS — I10 Essential (primary) hypertension: Secondary | ICD-10-CM | POA: Insufficient documentation

## 2022-10-30 DIAGNOSIS — D72829 Elevated white blood cell count, unspecified: Secondary | ICD-10-CM | POA: Insufficient documentation

## 2022-10-30 LAB — COMPREHENSIVE METABOLIC PANEL
ALT: 16 U/L (ref 0–44)
AST: 18 U/L (ref 15–41)
Albumin: 3.8 g/dL (ref 3.5–5.0)
Alkaline Phosphatase: 103 U/L (ref 38–126)
Anion gap: 10 (ref 5–15)
BUN: 22 mg/dL (ref 8–23)
CO2: 25 mmol/L (ref 22–32)
Calcium: 9.1 mg/dL (ref 8.9–10.3)
Chloride: 99 mmol/L (ref 98–111)
Creatinine, Ser: 0.94 mg/dL (ref 0.61–1.24)
GFR, Estimated: >60 mL/min (ref >60)
Glucose, Bld: 150 mg/dL — ABNORMAL HIGH (ref 70–99)
Potassium: 3.7 mmol/L (ref 3.5–5.1)
Sodium: 134 mmol/L — ABNORMAL LOW (ref 135–145)
Total Bilirubin: 1.2 mg/dL (ref 0.3–1.2)
Total Protein: 7.7 g/dL (ref 6.5–8.1)

## 2022-10-30 LAB — CBC WITH DIFFERENTIAL/PLATELET
Abs Immature Granulocytes: 0.12 10*3/uL — ABNORMAL HIGH (ref 0.00–0.07)
Basophils Absolute: 0.1 10*3/uL (ref 0.0–0.1)
Basophils Relative: 0 %
Eosinophils Absolute: 0.1 10*3/uL (ref 0.0–0.5)
Eosinophils Relative: 1 %
HCT: 44.4 % (ref 39.0–52.0)
Hemoglobin: 14.4 g/dL (ref 13.0–17.0)
Immature Granulocytes: 1 %
Lymphocytes Relative: 10 %
Lymphs Abs: 1.2 10*3/uL (ref 0.7–4.0)
MCH: 28.2 pg (ref 26.0–34.0)
MCHC: 32.4 g/dL (ref 30.0–36.0)
MCV: 86.9 fL (ref 80.0–100.0)
Monocytes Absolute: 0.8 10*3/uL (ref 0.1–1.0)
Monocytes Relative: 7 %
Neutro Abs: 9.6 10*3/uL — ABNORMAL HIGH (ref 1.7–7.7)
Neutrophils Relative %: 81 %
Platelets: 303 10*3/uL (ref 150–400)
RBC: 5.11 MIL/uL (ref 4.22–5.81)
RDW: 14.7 % (ref 11.5–15.5)
WBC: 11.9 10*3/uL — ABNORMAL HIGH (ref 4.0–10.5)
nRBC: 0 % (ref 0.0–0.2)

## 2022-10-30 LAB — URINALYSIS, ROUTINE W REFLEX MICROSCOPIC
Bilirubin Urine: NEGATIVE
Glucose, UA: NEGATIVE mg/dL
Hgb urine dipstick: NEGATIVE
Ketones, ur: NEGATIVE mg/dL
Leukocytes,Ua: NEGATIVE
Nitrite: NEGATIVE
Protein, ur: NEGATIVE mg/dL
Specific Gravity, Urine: 1.01 (ref 1.005–1.030)
pH: 5 (ref 5.0–8.0)

## 2022-10-30 LAB — I-STAT CG4 LACTIC ACID, ED: Lactic Acid, Venous: 1.2 mmol/L (ref 0.5–1.9)

## 2022-10-30 LAB — LIPASE, BLOOD: Lipase: 31 U/L (ref 11–51)

## 2022-10-30 MED ORDER — ALUM & MAG HYDROXIDE-SIMETH 200-200-20 MG/5ML PO SUSP
30.0000 mL | Freq: Once | ORAL | Status: DC
  Filled 2022-10-30: qty 30

## 2022-10-30 MED ORDER — SODIUM CHLORIDE (PF) 0.9 % IJ SOLN
INTRAMUSCULAR | Status: AC
  Filled 2022-10-30: qty 50

## 2022-10-30 MED ORDER — LIDOCAINE VISCOUS HCL 2 % MT SOLN
15.0000 mL | Freq: Once | OROMUCOSAL | Status: AC
  Administered 2022-10-30: 15 mL via ORAL
  Filled 2022-10-30: qty 15

## 2022-10-30 MED ORDER — IOHEXOL 300 MG/ML  SOLN
100.0000 mL | Freq: Once | INTRAMUSCULAR | Status: AC | PRN
  Administered 2022-10-30: 100 mL via INTRAVENOUS

## 2022-10-30 NOTE — ED Triage Notes (Signed)
Pt BIBA from nursing facility. States he called due to waking up with generalized abdominal pain, started today. Denies any N/V. Reports he has had few episodes of diarrhea, no blood in stool. NAD. Resp even and unlabored. MAE. Denies cp, sob,dizziness or any other symptoms at this time. Reports refused his medication this morning due to not feeling well.

## 2022-10-30 NOTE — Discharge Instructions (Signed)
Please follow-up with your GI specialist in regards to recent ER visit and symptoms.  Today your labs are all reassuring and your CT shows you have chronic diverticulosis most likely causing your pain.  If symptoms change or worsen please return to ER.

## 2022-10-30 NOTE — ED Notes (Signed)
Patient was transported safely

## 2022-10-30 NOTE — ED Notes (Signed)
Transport is pending for arrival.

## 2022-10-30 NOTE — ED Provider Notes (Signed)
[place] EMERGENCY DEPARTMENT AT [facility] Provider Note   CSN: [id number] Arrival date & time: 10/30/22  0920     History  Chief Complaint  Patient presents with   Abdominal Pain    Steven Simmons is a 82 y.o. male status post cholecystectomy, history of hypertension, hyperlipidemia, GERD, A-fib, peptic ulcer presented for generalized abdominal pain that has been present for a week.  Patient states that he has a generalized burning sensation in his abdomen and that it is in his epigastric region.  Patient states that eating food makes his burning sensation better.  Patient states he has not been drinking alcohol.  Patient states he is on Carafate and Protonix for his acid reflux and has been taking those as prescribed.  Patient denies any chest pain, shortness of breath, change in sensation is motor skills, history of AAA, nausea/vomiting, dysuria, bowel movement changes, fevers.   Home Medications Prior to Admission medications   Medication Sig Start Date End Date Taking? Authorizing Provider  acetaminophen (TYLENOL) 325 MG tablet Take 2 tablets (650 mg total) by mouth every 6 (six) hours as needed for mild pain (or Fever >/= 101). 06/14/22   Marguerita Merles Latif, DO  diltiazem (CARDIZEM CD) 120 MG 24 hr capsule Take 1 capsule (120 mg total) by mouth daily. 03/03/22 06/10/22  Ernie Avena, MD  ipratropium-albuterol (DUONEB) 0.5-2.5 (3) MG/3ML SOLN Take 3 mLs by nebulization 2 (two) times daily as needed (for shortness of breath, coughing, or wheezing).    [provider]  lisinopril (ZESTRIL) 10 MG tablet Take 1 tablet (10 mg total) by mouth daily. 06/14/22 06/14/23  Marguerita Merles Latif, DO  loperamide (IMODIUM A-D) 2 MG tablet Take 4 mg by mouth See admin instructions. Take 4 mg by mouth at onset of diarrhea, then 2 mg after each subsequent stool- not to exceed 16 grams/24 hours; call MD if no relief    [provider]  OXYGEN Inhale 1-5 L/min into the lungs  See admin instructions. 1-5 L/min via nasal cannula and may titrate to keep sats >90%    [provider]  pantoprazole (PROTONIX) 40 MG tablet Take 1 tablet (40 mg total) by mouth daily. Patient taking differently: Take 40 mg by mouth in the morning. 05/20/22 06/19/22  Theron Arista, PA-C  polyethylene glycol powder (GLYCOLAX/MIRALAX) 17 GM/SCOOP powder Take 1 Container by mouth daily as needed for mild constipation (to be mixed into 8 ounces of water).    [provider]  Skin Protectants, Misc. (EUCERIN) cream Apply 1 Application topically See admin instructions. Apply to the face in the morning and at bedtime    [provider]  sucralfate (CARAFATE) 1 g tablet Take 1 tablet (1 g total) by mouth 4 (four) times daily -  with meals and at bedtime. 06/14/22   Sheikh, Kateri Mc Latif, DO  TUMS 500 MG chewable tablet Chew 2 tablets by mouth 3 (three) times daily as needed for indigestion.    [provider]  ZANTAC 360 10 MG tablet Take 10 mg by mouth in the morning.    [provider]      Allergies    Ceclor [cefaclor]; Veralipride; Penicillins; Amlodipine; Antihistamines, chlorpheniramine-type; Beta adrenergic blockers; Celebrex [celecoxib]; Ibuprofen; Lipitor [atorvastatin]; and Nutrasweet aspartame [aspartame]    Review of Systems   Review of Systems  Gastrointestinal:  Positive for abdominal pain.    Physical Exam Updated Vital Signs BP (!) 160/91   Pulse 79   Temp 98.4 F (  36.9 C) (Oral)   Resp (!) 23   Ht 5\' 11"  (1.803 m)   Wt 99 kg   SpO2 95%   BMI 30.44 kg/m  Physical Exam Vitals reviewed.  Constitutional:      General: He is not in acute distress. HENT:     Head: Normocephalic and atraumatic.  Eyes:     Extraocular Movements: Extraocular movements intact.     Conjunctiva/sclera: Conjunctivae normal.     Pupils: Pupils are equal, round, and reactive to light.  Cardiovascular:     Rate and Rhythm: Normal rate and regular rhythm.      Pulses: Normal pulses.     Heart sounds: Normal heart sounds.     Comments: 2+ bilateral radial/dorsalis pedis pulses with regular rate Pulmonary:     Effort: Pulmonary effort is normal. No respiratory distress.     Breath sounds: Normal breath sounds.  Abdominal:     Palpations: Abdomen is soft.     Tenderness: There is no abdominal tenderness. There is no guarding or rebound. Negative signs include Rovsing's sign and psoas sign.  Musculoskeletal:        General: Normal range of motion.     Cervical back: Normal range of motion and neck supple.     Right lower leg: No edema.     Left lower leg: No edema.     Comments: 5 out of 5 bilateral grip/leg extension strength  Skin:    General: Skin is warm and dry.     Capillary Refill: Capillary refill takes less than 2 seconds.  Neurological:     General: No focal deficit present.     Mental Status: He is alert and oriented to person, place, and time.     Comments: Sensation intact in all 4 limbs  Psychiatric:        Mood and Affect: Mood normal.     ED Results / Procedures / Treatments   Labs (all labs ordered are listed, but only abnormal results are displayed) Labs Reviewed  CBC WITH DIFFERENTIAL/PLATELET - Abnormal; Notable for the following components:      Result Value   WBC 11.9 (*)    Neutro Abs 9.6 (*)    Abs Immature Granulocytes 0.12 (*)    All other components within normal limits  COMPREHENSIVE METABOLIC PANEL - Abnormal; Notable for the following components:   Sodium 134 (*)    Glucose, Bld 150 (*)    All other components within normal limits  LIPASE, BLOOD  URINALYSIS, ROUTINE W REFLEX MICROSCOPIC  I-STAT CG4 LACTIC ACID, ED  I-STAT CG4 LACTIC ACID, ED    EKG None  Radiology CT ABDOMEN PELVIS W CONTRAST  Result Date: 10/30/2022 CLINICAL DATA:  Generalized abdominal pain.  Recent diarrhea. EXAM: CT ABDOMEN AND PELVIS WITH CONTRAST TECHNIQUE: Multidetector CT imaging of the abdomen and pelvis was performed  using the standard protocol following bolus administration of intravenous contrast. RADIATION DOSE REDUCTION: This exam was performed according to the departmental dose-optimization program which includes automated exposure control, adjustment of the mA and/or kV according to patient size and/or use of iterative reconstruction technique. CONTRAST:  OMNIPAQUE IOHEXOL 300 MG/ML  SOLN COMPARISON:  CT June 2023 from outside institution. Images. No report. FINDINGS: Lower chest: There is some linear opacity seen along bases likely scar or atelectasis. No pleural effusion. Hepatobiliary: No focal liver abnormality is seen. Status post cholecystectomy. No biliary dilatation. Pancreas: Unremarkable. No pancreatic ductal dilatation or surrounding inflammatory changes. Spleen: Normal in  size without focal abnormality. Adrenals/Urinary Tract: Mild thickening of the adrenal glands bilaterally. Lobular kidneys noted. No enhancing mass. There are some simple parenchymal cysts. Example upper pole on the right measuring 2.1 cm with Hounsfield unit 4. Similar exophytic focus from the lower pole of the left kidney measuring 3.1 cm in diameter and Hounsfield units of 14. No collecting system dilatation. The ureters have normal course and caliber. Bladder diverticula identified. Stomach/Bowel: Stomach is nondilated on this non oral contrast exam. The small bowel has a normal course and caliber large bowel has scattered colonic stool. Diffuse colonic diverticulosis particularly along the descending and sigmoid colon. No obstruction or dilatation. Normal appendix. Vascular/Lymphatic: Aortic atherosclerosis. No enlarged abdominal or pelvic lymph nodes. Reproductive: Enlarged prostate with mass effect along the base of the bladder. Other: No free air or free fluid. Musculoskeletal: Moderate degenerative changes of the spine and pelvis. Bridging osteophytes along the sacroiliac joints. IMPRESSION: Colonic diverticulosis.  No bowel  obstruction.  Normal appendix. Enlarged prostate.  Bladder wall thickening and diverticula. Electronically Signed   By: Karen Kays M.D.   On: 10/30/2022 15:32    Procedures Procedures    Medications Ordered in ED Medications  alum & mag hydroxide-simeth (MAALOX/MYLANTA) 200-200-20 MG/5ML suspension 30 mL (30 mLs Oral Patient Refused/Not Given 10/30/22 1236)    And  lidocaine (XYLOCAINE) 2 % viscous mouth solution 15 mL (15 mLs Oral Given 10/30/22 1233)  iohexol (OMNIPAQUE) 300 MG/ML solution 100 mL (100 mLs Intravenous Contrast Given 10/30/22 1444)    ED Course/ Medical Decision Making/ A&P                             Medical Decision Making Amount and/or Complexity of Data Reviewed Labs: ordered. Radiology: ordered.  Risk OTC drugs. Prescription drug management.   Geronimo Boot 82 y.o. presented today for generalized abdominal pain. Working DDx that I considered at this time includes, but not limited to, gastroenteritis, colitis, small bowel obstruction, appendicitis, cholecystitis, pancreatitis, nephrolithiasis, AAA, UTI, pyelonephritis, testicular torsion.  R/o DDx: gastroenteritis, colitis, small bowel obstruction, appendicitis, cholecystitis, pancreatitis, nephrolithiasis, AAA, UTI, pyelonephritis, testicular torsion: These are considered less likely due to history of present illness and physical exam findings.  Review of prior external notes: 06/09/2022 discharge  Unique Tests and My Interpretation:  CBC with differential: Leukocytosis 11.9 CMP: Unremarkable Lipase: Unremarkable Lactic acid: 1.2 UA: Unremarkable EKG: A-fib 89 bpm, no ST elevations or depressions noted, no blocks noted CT Abd/Pelvis with contrast: Chronic diverticulosis  Discussion with Independent Historian: None  Discussion of Management of Tests: None  Risk: Low: based on diagnostic testing/clinical impression and treatment plan  Risk Stratification Score: None  Plan: On exam patient was in  no acute distress stable vitals.  Patient had unremarkable physical exam including a nontender abdomen without peritoneal signs.  Patient was poor historian so history taking was difficult however it appears patient does have peptic ulcer disease and that his symptoms resolved with food indicative of possible duodenal ulcer.  Patient be given GI cocktail and abdominal labs to be ordered.  Patient does not Dors any nausea vomiting or urinary/bowel symptoms.  Patient not currently endorsing any pain or requiring any Zofran.  GI cocktail will be ordered and patient be monitored.  Patient's labs came back with a slight leukocytosis of 11.9.  Patient does still have appendix and so in consideration with his age a CT scan was ordered to rule out infectious cause.  EKG  shows the patient is currently in A-fib and patient states he is not anticoagulated.  Lactic acid was ordered to rule out mesenteric ischemia and the need for a CTA.  Lactic acid came back negative and so we will proceed with CT with contrast.  CT shows chronic diverticulosis.  Patient has been here for 7 hours and vitals are remained stable and patient has been asymptomatic.  Repeat abdominal exam was nonconcerning as patient's abdomen is soft nontender without peritoneal signs.  I spoke to the patient and following up with GI due to his chronic diverticulosis as he seen [facility] in the past and patient stated that he would follow-up with them and wants to be discharged.  Patient be discharged with GI follow-up.  Patient was given return precautions. Patient stable for discharge at this time.  Patient verbalized understanding of plan.         Final Clinical Impression(s) / ED Diagnoses Final diagnoses:  Diverticulosis    Rx / DC Orders ED Discharge Orders     None         Remi Deter 10/30/22 1619    Bethann Berkshire, MD 11/03/22 [phone]

## 2022-11-01 ENCOUNTER — Encounter: Payer: Self-pay | Admitting: Nurse Practitioner

## 2022-12-02 ENCOUNTER — Emergency Department (HOSPITAL_COMMUNITY): Payer: Medicare PPO

## 2022-12-02 ENCOUNTER — Encounter (HOSPITAL_COMMUNITY): Payer: Self-pay

## 2022-12-02 ENCOUNTER — Emergency Department (HOSPITAL_COMMUNITY)
Admission: EM | Admit: 2022-12-02 | Discharge: 2022-12-02 | Disposition: A | Discharge status: Home / Self Care | Payer: Medicare PPO | Attending: Emergency Medicine | Admitting: Emergency Medicine

## 2022-12-02 ENCOUNTER — Other Ambulatory Visit: Payer: Self-pay

## 2022-12-02 DIAGNOSIS — R000 Tachycardia, unspecified: Secondary | ICD-10-CM | POA: Diagnosis not present

## 2022-12-02 DIAGNOSIS — Z79899 Other long term (current) drug therapy: Secondary | ICD-10-CM | POA: Diagnosis not present

## 2022-12-02 DIAGNOSIS — E876 Hypokalemia: Secondary | ICD-10-CM | POA: Diagnosis not present

## 2022-12-02 DIAGNOSIS — I160 Hypertensive urgency: Secondary | ICD-10-CM | POA: Insufficient documentation

## 2022-12-02 DIAGNOSIS — R519 Headache, unspecified: Secondary | ICD-10-CM | POA: Diagnosis present

## 2022-12-02 DIAGNOSIS — U071 COVID-19: Secondary | ICD-10-CM | POA: Diagnosis not present

## 2022-12-02 LAB — CBC WITH DIFFERENTIAL/PLATELET
Abs Immature Granulocytes: 0.1 10*3/uL — ABNORMAL HIGH (ref 0.00–0.07)
Basophils Absolute: 0 10*3/uL (ref 0.0–0.1)
Basophils Relative: 0 %
Eosinophils Absolute: 0.1 10*3/uL (ref 0.0–0.5)
Eosinophils Relative: 1 %
HCT: 39.1 % (ref 39.0–52.0)
Hemoglobin: 13 g/dL (ref 13.0–17.0)
Immature Granulocytes: 1 %
Lymphocytes Relative: 7 %
Lymphs Abs: 0.6 10*3/uL — ABNORMAL LOW (ref 0.7–4.0)
MCH: 28.4 pg (ref 26.0–34.0)
MCHC: 33.2 g/dL (ref 30.0–36.0)
MCV: 85.6 fL (ref 80.0–100.0)
Monocytes Absolute: 0.8 10*3/uL (ref 0.1–1.0)
Monocytes Relative: 10 %
Neutro Abs: 6.5 10*3/uL (ref 1.7–7.7)
Neutrophils Relative %: 81 %
Platelets: 269 10*3/uL (ref 150–400)
RBC: 4.57 MIL/uL (ref 4.22–5.81)
RDW: 14.7 % (ref 11.5–15.5)
WBC: 8.1 10*3/uL (ref 4.0–10.5)
nRBC: 0 % (ref 0.0–0.2)

## 2022-12-02 LAB — COMPREHENSIVE METABOLIC PANEL
ALT: 18 U/L (ref 0–44)
AST: 22 U/L (ref 15–41)
Albumin: 3.8 g/dL (ref 3.5–5.0)
Alkaline Phosphatase: 102 U/L (ref 38–126)
Anion gap: 13 (ref 5–15)
BUN: 15 mg/dL (ref 8–23)
CO2: 26 mmol/L (ref 22–32)
Calcium: 8.7 mg/dL — ABNORMAL LOW (ref 8.9–10.3)
Chloride: 92 mmol/L — ABNORMAL LOW (ref 98–111)
Creatinine, Ser: 0.86 mg/dL (ref 0.61–1.24)
GFR, Estimated: >60 mL/min (ref >60)
Glucose, Bld: 156 mg/dL — ABNORMAL HIGH (ref 70–99)
Potassium: 3 mmol/L — ABNORMAL LOW (ref 3.5–5.1)
Sodium: 131 mmol/L — ABNORMAL LOW (ref 135–145)
Total Bilirubin: 0.9 mg/dL (ref 0.3–1.2)
Total Protein: 7.9 g/dL (ref 6.5–8.1)

## 2022-12-02 LAB — URINALYSIS, W/ REFLEX TO CULTURE (INFECTION SUSPECTED)
Bacteria, UA: NONE SEEN
Bilirubin Urine: NEGATIVE
Glucose, UA: NEGATIVE mg/dL
Hgb urine dipstick: NEGATIVE
Ketones, ur: 5 mg/dL — AB
Leukocytes,Ua: NEGATIVE
Nitrite: NEGATIVE
Protein, ur: NEGATIVE mg/dL
Specific Gravity, Urine: 1.01 (ref 1.005–1.030)
pH: 8 (ref 5.0–8.0)

## 2022-12-02 LAB — SARS CORONAVIRUS 2 BY RT PCR: SARS Coronavirus 2 by RT PCR: POSITIVE — AB

## 2022-12-02 LAB — TROPONIN I (HIGH SENSITIVITY): Troponin I (High Sensitivity): 10 ng/L (ref <18)

## 2022-12-02 MED ORDER — DILTIAZEM HCL ER COATED BEADS 120 MG PO CP24
120.0000 mg | ORAL_CAPSULE | Freq: Once | ORAL | Status: AC
  Administered 2022-12-02: 120 mg via ORAL
  Filled 2022-12-02: qty 1

## 2022-12-02 MED ORDER — ACETAMINOPHEN 325 MG PO TABS: 650.0000 mg | ORAL_TABLET | ORAL | Status: DC | PRN

## 2022-12-02 MED ORDER — POTASSIUM CHLORIDE CRYS ER 20 MEQ PO TBCR: 20.0000 meq | EXTENDED_RELEASE_TABLET | Freq: Every day | ORAL | 0 refills | Status: DC

## 2022-12-02 MED ORDER — METOCLOPRAMIDE HCL 5 MG/ML IJ SOLN
5.0000 mg | Freq: Once | INTRAMUSCULAR | Status: AC
  Administered 2022-12-02: 5 mg via INTRAVENOUS
  Filled 2022-12-02: qty 2

## 2022-12-02 MED ORDER — ACETAMINOPHEN 500 MG PO TABS
1000.0000 mg | ORAL_TABLET | Freq: Once | ORAL | Status: AC
  Administered 2022-12-02: 1000 mg via ORAL
  Filled 2022-12-02: qty 2

## 2022-12-02 MED ORDER — CALCIUM CARBONATE ANTACID 500 MG PO CHEW: 1.0000 | CHEWABLE_TABLET | Freq: Once | ORAL | Status: DC

## 2022-12-02 MED ORDER — LACTATED RINGERS IV BOLUS
500.0000 mL | Freq: Once | INTRAVENOUS | Status: AC
  Administered 2022-12-02: 500 mL via INTRAVENOUS

## 2022-12-02 MED ORDER — PAXLOVID (300/100) 20 X 150 MG & 10 X 100MG PO TBPK: 3.0000 | ORAL_TABLET | Freq: Two times a day (BID) | ORAL | 0 refills | Status: AC

## 2022-12-02 MED ORDER — LISINOPRIL 10 MG PO TABS
10.0000 mg | ORAL_TABLET | Freq: Once | ORAL | Status: AC
  Administered 2022-12-02: 10 mg via ORAL
  Filled 2022-12-02: qty 1

## 2022-12-02 MED ORDER — POTASSIUM CHLORIDE CRYS ER 20 MEQ PO TBCR
40.0000 meq | EXTENDED_RELEASE_TABLET | Freq: Once | ORAL | Status: AC
  Administered 2022-12-02: 40 meq via ORAL
  Filled 2022-12-02: qty 2

## 2022-12-02 MED ORDER — FAMOTIDINE 20 MG PO TABS: 20.0000 mg | ORAL_TABLET | Freq: Once | ORAL | Status: DC

## 2022-12-02 NOTE — ED Provider Notes (Signed)
[place] EMERGENCY DEPARTMENT AT [facility] Provider Note   CSN: [id number] Arrival date & time: 12/02/22  8413     History  Chief Complaint  Patient presents with   Headache    Steven Simmons is a 82 y.o. male.  HPI 82 year old male presents with frequent ear nation and headache.  He states he has been having frequent urination and large amounts of urination for the past week or so.  He is also been dealing with a frontal headache for the last 12 hours or so.  He reports it is severe.  He denies any chest pain but has acute on chronic dyspnea this morning as well.  He states his blood pressure meds are not working yet.  EMS reported to the nurse that he did not yet take his meds this morning.  Home Medications Prior to Admission medications   Medication Sig Start Date End Date Taking? Authorizing Provider  nirmatrelvir & ritonavir (PAXLOVID, 300/100,) 20 x 150 MG & 10 x 100MG  TBPK Take 3 tablets by mouth 2 (two) times daily for 5 days. 12/02/22 12/07/22 Yes Pricilla Loveless, MD  potassium chloride SA (KLOR-CON M) 20 MEQ tablet Take 1 tablet (20 mEq total) by mouth daily. 12/02/22  Yes Pricilla Loveless, MD  acetaminophen (TYLENOL) 325 MG tablet Take 2 tablets (650 mg total) by mouth every 6 (six) hours as needed for mild pain (or Fever >/= 101). 06/14/22   Marguerita Merles Latif, DO  diltiazem (CARDIZEM CD) 120 MG 24 hr capsule Take 1 capsule (120 mg total) by mouth daily. 03/03/22 06/10/22  Ernie Avena, MD  ipratropium-albuterol (DUONEB) 0.5-2.5 (3) MG/3ML SOLN Take 3 mLs by nebulization 2 (two) times daily as needed (for shortness of breath, coughing, or wheezing).    [provider]  lisinopril (ZESTRIL) 10 MG tablet Take 1 tablet (10 mg total) by mouth daily. 06/14/22 06/14/23  Marguerita Merles Latif, DO  loperamide (IMODIUM A-D) 2 MG tablet Take 4 mg by mouth See admin instructions. Take 4 mg by mouth at onset of diarrhea, then 2 mg after each subsequent stool- not to  exceed 16 grams/24 hours; call MD if no relief    [provider]  OXYGEN Inhale 1-5 L/min into the lungs See admin instructions. 1-5 L/min via nasal cannula and may titrate to keep sats >90%    [provider]  pantoprazole (PROTONIX) 40 MG tablet Take 1 tablet (40 mg total) by mouth daily. Patient taking differently: Take 40 mg by mouth in the morning. 05/20/22 06/19/22  Theron Arista, PA-C  polyethylene glycol powder (GLYCOLAX/MIRALAX) 17 GM/SCOOP powder Take 1 Container by mouth daily as needed for mild constipation (to be mixed into 8 ounces of water).    [provider]  Skin Protectants, Misc. (EUCERIN) cream Apply 1 Application topically See admin instructions. Apply to the face in the morning and at bedtime    [provider]  sucralfate (CARAFATE) 1 g tablet Take 1 tablet (1 g total) by mouth 4 (four) times daily -  with meals and at bedtime. 06/14/22   Sheikh, Kateri Mc Latif, DO  TUMS 500 MG chewable tablet Chew 2 tablets by mouth 3 (three) times daily as needed for indigestion.    [provider]  ZANTAC 360 10 MG tablet Take 10 mg by mouth in the morning.    [provider]      Allergies    Ceclor [cefaclor]; Veralipride; Penicillins; Amlodipine; Antihistamines, chlorpheniramine-type; Beta adrenergic blockers; Celebrex [celecoxib]; Ibuprofen;  Lipitor [atorvastatin]; and Nutrasweet aspartame [aspartame]    Review of Systems   Review of Systems  Constitutional:  Negative for fever.  Respiratory:  Positive for shortness of breath.   Cardiovascular:  Negative for chest pain.  Gastrointestinal:  Positive for nausea. Negative for abdominal pain and vomiting.  Genitourinary:  Positive for frequency and urgency. Negative for dysuria.  Neurological:  Positive for headaches.    Physical Exam Updated Vital Signs BP (!) 145/95   Pulse 83   Temp 97.9 F (36.6 C)   Resp (!) 23   SpO2 95%  Physical Exam Vitals and nursing note reviewed.   Constitutional:      Appearance: He is well-developed.  HENT:     Head: Normocephalic and atraumatic.  Eyes:     Extraocular Movements: Extraocular movements intact.     Pupils: Pupils are equal, round, and reactive to light.  Cardiovascular:     Rate and Rhythm: Tachycardia present. Rhythm irregular.     Heart sounds: Normal heart sounds.     Comments: HR~100 Pulmonary:     Effort: Pulmonary effort is normal.     Breath sounds: Normal breath sounds.  Abdominal:     Palpations: Abdomen is soft.     Tenderness: There is no abdominal tenderness.  Skin:    General: Skin is warm and dry.  Neurological:     Mental Status: He is alert.     Comments: Awake, alert, clear speech. 5/5 strength in all 4 extremities.      ED Results / Procedures / Treatments   Labs (all labs ordered are listed, but only abnormal results are displayed) Labs Reviewed  SARS CORONAVIRUS 2 BY RT PCR - Abnormal; Notable for the following components:      Result Value   SARS Coronavirus 2 by RT PCR POSITIVE (*)    All other components within normal limits  COMPREHENSIVE METABOLIC PANEL - Abnormal; Notable for the following components:   Sodium 131 (*)    Potassium 3.0 (*)    Chloride 92 (*)    Glucose, Bld 156 (*)    Calcium 8.7 (*)    All other components within normal limits  CBC WITH DIFFERENTIAL/PLATELET - Abnormal; Notable for the following components:   Lymphs Abs 0.6 (*)    Abs Immature Granulocytes 0.10 (*)    All other components within normal limits  URINALYSIS, W/ REFLEX TO CULTURE (INFECTION SUSPECTED) - Abnormal; Notable for the following components:   Color, Urine STRAW (*)    Ketones, ur 5 (*)    All other components within normal limits  TROPONIN I (HIGH SENSITIVITY)    EKG EKG Interpretation Date/Time:  Thursday December 02 2022 09:57:45 EDT Ventricular Rate:  96 PR Interval:    QRS Duration:  100 QT Interval:  358 QTC Calculation: 453 R Axis:   -2  Text  Interpretation: Atrial fibrillation Minimal ST depression, lateral leads Confirmed by Pricilla Loveless [phone]) on 12/02/2022 10:04:10 AM  Radiology CT Head Wo Contrast  Result Date: 12/02/2022 CLINICAL DATA:  Severe headache EXAM: CT HEAD WITHOUT CONTRAST TECHNIQUE: Contiguous axial images were obtained from the base of the skull through the vertex without intravenous contrast. RADIATION DOSE REDUCTION: This exam was performed according to the departmental dose-optimization program which includes automated exposure control, adjustment of the mA and/or kV according to patient size and/or use of iterative reconstruction technique. COMPARISON:  06/09/2022 FINDINGS: Brain: No evidence of acute infarction, hemorrhage, hydrocephalus, extra-axial collection or mass lesion/mass effect. Subcortical  white matter and periventricular small vessel ischemic changes. Vascular: No hyperdense vessel or unexpected calcification. Skull: Normal. Negative for fracture or focal lesion. Sinuses/Orbits: The visualized paranasal sinuses are essentially clear. The mastoid air cells are unopacified. Other: None. IMPRESSION: No acute intracranial abnormality. Small vessel ischemic changes. Electronically Signed   By: Charline Bills M.D.   On: 12/02/2022 11:39   DG Chest 2 View  Result Date: 12/02/2022 CLINICAL DATA:  Dyspnea and hypertension EXAM: CHEST - 2 VIEW COMPARISON:  Chest radiograph dated 06/13/2022 FINDINGS: Normal lung volumes. Hazy left lower lung opacities likely related to prominent pericardial fat pad. No pleural effusion or pneumothorax. The heart size and mediastinal contours are within normal limits. No acute osseous abnormality. IMPRESSION: Hazy left lower lung opacities, likely related to prominent pericardial fat pad. Otherwise, no focal consolidations. Electronically Signed   By: Agustin Cree M.D.   On: 12/02/2022 11:09    Procedures Procedures    Medications Ordered in ED Medications  acetaminophen  (TYLENOL) tablet 1,000 mg (1,000 mg Oral Given 12/02/22 1102)  metoCLOPramide (REGLAN) injection 5 mg (5 mg Intravenous Given 12/02/22 1103)  lactated ringers bolus 500 mL (0 mLs Intravenous Stopped 12/02/22 1202)  lisinopril (ZESTRIL) tablet 10 mg (10 mg Oral Given 12/02/22 1102)  diltiazem (CARDIZEM CD) 24 hr capsule 120 mg (120 mg Oral Given 12/02/22 1102)  potassium chloride SA (KLOR-CON M) CR tablet 40 mEq (40 mEq Oral Given 12/02/22 1202)    ED Course/ Medical Decision Making/ A&P                                 Medical Decision Making Amount and/or Complexity of Data Reviewed Labs: ordered.    Details: Covid positive No UTI Mild hypokalemia Radiology: ordered and independent interpretation performed.    Details: No head bleed ECG/medicine tests: ordered and independent interpretation performed.    Details: afib  Risk OTC drugs. Prescription drug management.   Patient presents with a headache and some urinary complaints.  Sounds like the urinary complaints open going on for a long time.  I discussed with the [facility] nurse and she indicates that he chronically has been complaining of urinary complaints.  His postvoid was around 130 mL so I do not think retention is likely.  No UTI on the urine.  His headache likely was a combination of hypertensive urgency (without any obvious signs of endorgan damage) as well as COVID.  Started feeling poorly last night so I suspect this is relatively acute and since he is not hypoxic (wears as needed oxygen at baseline but no O2 sats below 90% here), I do not think he needs admission.  He would like to start Paxlovid and we will also replace his potassium.  Otherwise, doubt ACS, PE, bacterial pneumonia.  There are some questionable left lower lung opacities on x-ray but since he is not hypoxic and not distressed I think is reasonable to treat as an outpatient with return precautions.       Final Clinical Impression(s) / ED Diagnoses Final  diagnoses:  COVID-19  Hypertensive urgency  Hypokalemia    Rx / DC Orders ED Discharge Orders          Ordered    potassium chloride SA (KLOR-CON M) 20 MEQ tablet  Daily        12/02/22 1205    nirmatrelvir & ritonavir (PAXLOVID, 300/100,) 20 x 150 MG & 10 x 100MG   TBPK  2 times daily        12/02/22 1211              Pricilla Loveless, MD 12/02/22 1216

## 2022-12-02 NOTE — ED Notes (Signed)
Post Void Bladder Scan 

## 2022-12-02 NOTE — Discharge Instructions (Addendum)
Your COVID test is positive today.  Your blood pressure was elevated but fortunately has come down, make sure you are taking your medicines as prescribed.  Your potassium is also a little low so we are prescribing you 3 days of potassium supplementation.  We are prescribing you Paxlovid to help reduce the COVID symptoms, check with your pharmacist to make sure there are no drug interactions with your current medication regimen.  If you develop new or worsening shortness of breath, severe headache, coughing up blood, chest pain, or any other new/concerning symptoms then return to the ER or call 911.

## 2022-12-02 NOTE — ED Triage Notes (Signed)
BIB [facility] from [place] [facility] for c/o headache 192/100 hx of HTN Afib has not received meds yet this morning. C/o dry mouth and frequent urination. Pt called EMS, staff unaware.

## 2022-12-24 ENCOUNTER — Emergency Department (HOSPITAL_COMMUNITY): Payer: Medicare PPO

## 2022-12-24 ENCOUNTER — Other Ambulatory Visit: Payer: Self-pay

## 2022-12-24 ENCOUNTER — Encounter (HOSPITAL_COMMUNITY): Payer: Self-pay | Admitting: *Deleted

## 2022-12-24 ENCOUNTER — Emergency Department (HOSPITAL_COMMUNITY)
Admission: EM | Admit: 2022-12-24 | Discharge: 2022-12-25 | Disposition: A | Discharge status: Home / Self Care | Payer: Medicare PPO | Attending: Emergency Medicine | Admitting: Emergency Medicine

## 2022-12-24 DIAGNOSIS — I10 Essential (primary) hypertension: Secondary | ICD-10-CM | POA: Insufficient documentation

## 2022-12-24 DIAGNOSIS — E876 Hypokalemia: Secondary | ICD-10-CM | POA: Diagnosis not present

## 2022-12-24 DIAGNOSIS — Z79899 Other long term (current) drug therapy: Secondary | ICD-10-CM | POA: Insufficient documentation

## 2022-12-24 DIAGNOSIS — R739 Hyperglycemia, unspecified: Secondary | ICD-10-CM | POA: Diagnosis not present

## 2022-12-24 DIAGNOSIS — E871 Hypo-osmolality and hyponatremia: Secondary | ICD-10-CM | POA: Insufficient documentation

## 2022-12-24 DIAGNOSIS — R197 Diarrhea, unspecified: Secondary | ICD-10-CM | POA: Diagnosis present

## 2022-12-24 DIAGNOSIS — R051 Acute cough: Secondary | ICD-10-CM | POA: Insufficient documentation

## 2022-12-24 DIAGNOSIS — R7309 Other abnormal glucose: Secondary | ICD-10-CM

## 2022-12-24 LAB — CBC
HCT: 42.8 % (ref 39.0–52.0)
Hemoglobin: 13.8 g/dL (ref 13.0–17.0)
MCH: 27.9 pg (ref 26.0–34.0)
MCHC: 32.2 g/dL (ref 30.0–36.0)
MCV: 86.6 fL (ref 80.0–100.0)
Platelets: 297 10*3/uL (ref 150–400)
RBC: 4.94 MIL/uL (ref 4.22–5.81)
RDW: 14.6 % (ref 11.5–15.5)
WBC: 7.5 10*3/uL (ref 4.0–10.5)
nRBC: 0 % (ref 0.0–0.2)

## 2022-12-24 LAB — COMPREHENSIVE METABOLIC PANEL
ALT: 18 U/L (ref 0–44)
AST: 19 U/L (ref 15–41)
Albumin: 3.8 g/dL (ref 3.5–5.0)
Alkaline Phosphatase: 111 U/L (ref 38–126)
Anion gap: 11 (ref 5–15)
BUN: 22 mg/dL (ref 8–23)
CO2: 28 mmol/L (ref 22–32)
Calcium: 9.3 mg/dL (ref 8.9–10.3)
Chloride: 95 mmol/L — ABNORMAL LOW (ref 98–111)
Creatinine, Ser: 0.9 mg/dL (ref 0.61–1.24)
GFR, Estimated: >60 mL/min (ref >60)
Glucose, Bld: 132 mg/dL — ABNORMAL HIGH (ref 70–99)
Potassium: 3.4 mmol/L — ABNORMAL LOW (ref 3.5–5.1)
Sodium: 134 mmol/L — ABNORMAL LOW (ref 135–145)
Total Bilirubin: 0.5 mg/dL (ref 0.3–1.2)
Total Protein: 7.7 g/dL (ref 6.5–8.1)

## 2022-12-24 LAB — LIPASE, BLOOD: Lipase: 29 U/L (ref 11–51)

## 2022-12-24 NOTE — ED Triage Notes (Signed)
PER EMS REPORT.........[facility] Kitchen[facility] with [facility] and wants his potassium checked. Dry cough since having COVID several weeks ago. Reporting feeling dizzy. 150/110, hr 68, 97% ra, cbg 138, 98.2 temp.

## 2022-12-24 NOTE — ED Provider Triage Note (Signed)
Emergency Medicine Provider Triage Evaluation Note  Thaden Abajian , a 82 y.o. male  was evaluated in triage.  Pt complains of diarrhea and cough. Had COVID a few weeks ago, treated with paxlovid, felt better, but cough returned. Started having bloating yesterday, diarrhea started today. No fever or abd pain. Feels somewhat dizzy. Had low potassium before, wants this rechecked today.   Review of Systems  Positive: Dry cough, diarrhea, dizziness Negative: Abd pain, fever  Physical Exam  BP (!) 180/90 (BP Location: Left Arm)   Pulse 88   Temp 98.4 F (36.9 C) (Oral)   Resp 18   SpO2 97%  Gen:   Awake, no distress   Resp:  Normal effort  MSK:   Moves extremities without difficulty  Other:    Medical Decision Making  Medically screening exam initiated at 9:25 PM.  Appropriate orders placed.  Geronimo Boot was informed that the remainder of the evaluation will be completed by another provider, this initial triage assessment does not replace that evaluation, and the importance of remaining in the ED until their evaluation is complete.  Workup initiated   Jeanella Flattery 12/24/22 2127

## 2022-12-24 NOTE — ED Triage Notes (Signed)
Pt reporting gas pains yesterday, onset of diarrhea today. He has had a dry cough "since getting out of quarantine last week" denies fevers.

## 2022-12-25 ENCOUNTER — Other Ambulatory Visit (HOSPITAL_COMMUNITY): Payer: Self-pay

## 2022-12-25 DIAGNOSIS — R197 Diarrhea, unspecified: Secondary | ICD-10-CM | POA: Diagnosis not present

## 2022-12-25 LAB — URINALYSIS, ROUTINE W REFLEX MICROSCOPIC
Bilirubin Urine: NEGATIVE
Glucose, UA: NEGATIVE mg/dL
Hgb urine dipstick: NEGATIVE
Ketones, ur: NEGATIVE mg/dL
Leukocytes,Ua: NEGATIVE
Nitrite: NEGATIVE
Protein, ur: NEGATIVE mg/dL
Specific Gravity, Urine: 1.01 (ref 1.005–1.030)
pH: 5 (ref 5.0–8.0)

## 2022-12-25 MED ORDER — PREDNISONE 20 MG PO TABS
60.0000 mg | ORAL_TABLET | Freq: Once | ORAL | Status: AC
  Administered 2022-12-25: 60 mg via ORAL
  Filled 2022-12-25: qty 3

## 2022-12-25 MED ORDER — POTASSIUM CHLORIDE CRYS ER 20 MEQ PO TBCR
40.0000 meq | EXTENDED_RELEASE_TABLET | Freq: Once | ORAL | Status: AC
  Administered 2022-12-25: 40 meq via ORAL
  Filled 2022-12-25: qty 2

## 2022-12-25 MED ORDER — PREDNISONE 50 MG PO TABS
50.0000 mg | ORAL_TABLET | Freq: Every day | ORAL | 0 refills | Status: DC
  Filled 2022-12-25: qty 5, 5d supply, fill #0

## 2022-12-25 MED ORDER — ALBUTEROL SULFATE HFA 108 (90 BASE) MCG/ACT IN AERS
2.0000 | INHALATION_SPRAY | RESPIRATORY_TRACT | 0 refills | Status: AC | PRN
  Filled 2022-12-25: qty 6.7, 17d supply, fill #0

## 2022-12-25 MED ORDER — IPRATROPIUM-ALBUTEROL 0.5-2.5 (3) MG/3ML IN SOLN
3.0000 mL | Freq: Once | RESPIRATORY_TRACT | Status: AC
  Administered 2022-12-25: 3 mL via RESPIRATORY_TRACT
  Filled 2022-12-25: qty 3

## 2022-12-25 NOTE — ED Provider Notes (Signed)
[place] EMERGENCY DEPARTMENT AT [facility] Provider Note   CSN: [id number] Arrival date & time: 12/24/22  2053     History  Chief Complaint  Patient presents with   Diarrhea    Steven Simmons is a 82 y.o. male.  The history is provided by the patient.  Diarrhea He has history of hypertension, hyperlipidemia, GERD, peptic ulcer disease, atrial fibrillation not on anticoagulation because of history of GI bleeding comes in because of nonproductive cough and diarrhea.  He relates that he was recently diagnosed with COVID-19.  He denies fever or chills.  He has had diarrhea for the last 4 days admitting to 4 loose bowel movements a day.  Loperamide has not helped.  Last bowel movement was about 12 hours ago.  He also relates an nonproductive cough.  He denies dyspnea.  He denies fever or chills.   Home Medications Prior to Admission medications   Medication Sig Start Date End Date Taking? Authorizing Provider  acetaminophen (TYLENOL) 325 MG tablet Take 2 tablets (650 mg total) by mouth every 6 (six) hours as needed for mild pain (or Fever >/= 101). 06/14/22   Marguerita Merles Latif, DO  diltiazem (CARDIZEM CD) 120 MG 24 hr capsule Take 1 capsule (120 mg total) by mouth daily. 03/03/22 06/10/22  Ernie Avena, MD  ipratropium-albuterol (DUONEB) 0.5-2.5 (3) MG/3ML SOLN Take 3 mLs by nebulization 2 (two) times daily as needed (for shortness of breath, coughing, or wheezing).    [provider]  lisinopril (ZESTRIL) 10 MG tablet Take 1 tablet (10 mg total) by mouth daily. 06/14/22 06/14/23  Marguerita Merles Latif, DO  loperamide (IMODIUM A-D) 2 MG tablet Take 4 mg by mouth See admin instructions. Take 4 mg by mouth at onset of diarrhea, then 2 mg after each subsequent stool- not to exceed 16 grams/24 hours; call MD if no relief    [provider]  OXYGEN Inhale 1-5 L/min into the lungs See admin instructions. 1-5 L/min via nasal cannula and may titrate to keep sats >90%     [provider]  pantoprazole (PROTONIX) 40 MG tablet Take 1 tablet (40 mg total) by mouth daily. Patient taking differently: Take 40 mg by mouth in the morning. 05/20/22 06/19/22  Theron Arista, PA-C  polyethylene glycol powder (GLYCOLAX/MIRALAX) 17 GM/SCOOP powder Take 1 Container by mouth daily as needed for mild constipation (to be mixed into 8 ounces of water).    [provider]  potassium chloride SA (KLOR-CON M) 20 MEQ tablet Take 1 tablet (20 mEq total) by mouth daily. 12/02/22   Pricilla Loveless, MD  Skin Protectants, Misc. (EUCERIN) cream Apply 1 Application topically See admin instructions. Apply to the face in the morning and at bedtime    [provider]  sucralfate (CARAFATE) 1 g tablet Take 1 tablet (1 g total) by mouth 4 (four) times daily -  with meals and at bedtime. 06/14/22   Sheikh, Kateri Mc Latif, DO  TUMS 500 MG chewable tablet Chew 2 tablets by mouth 3 (three) times daily as needed for indigestion.    [provider]  ZANTAC 360 10 MG tablet Take 10 mg by mouth in the morning.    [provider]      Allergies    Ceclor [cefaclor]; Veralipride; Penicillins; Amlodipine; Antihistamines, chlorpheniramine-type; Beta adrenergic blockers; Celebrex [celecoxib]; Ibuprofen; Lipitor [atorvastatin]; and Nutrasweet aspartame [aspartame]    Review of Systems   Review of Systems  Gastrointestinal:  Positive for diarrhea.  All  other systems reviewed and are negative.   Physical Exam Updated Vital Signs BP (!) 158/90   Pulse 89   Temp 98.4 F (36.9 C) (Oral)   Resp 18   SpO2 92%  Physical Exam Vitals and nursing note reviewed.   82 year old male, resting comfortably and in no acute distress. Vital signs are significant for elevated blood pressure. Oxygen saturation is 95%, which is normal. Head is normocephalic and atraumatic. PERRLA, EOMI. Oropharynx is clear. Neck is nontender and supple without adenopathy. Lungs are clear without rales,  wheezes, or rhonchi. Chest is nontender. Heart has regular rate and rhythm without murmur. Abdomen is soft, flat, nontender. Extremities have no cyanosis or edema, full range of motion is present. Skin is warm and dry without rash. Neurologic: Mental status is normal, cranial nerves are intact, moves all extremities equally.  ED Results / Procedures / Treatments   Labs (all labs ordered are listed, but only abnormal results are displayed) Labs Reviewed  COMPREHENSIVE METABOLIC PANEL - Abnormal; Notable for the following components:      Result Value   Sodium 134 (*)    Potassium 3.4 (*)    Chloride 95 (*)    Glucose, Bld 132 (*)    All other components within normal limits  LIPASE, BLOOD  CBC  URINALYSIS, ROUTINE W REFLEX MICROSCOPIC    EKG EKG Interpretation Date/Time:  Friday December 24 2022 21:09:16 EDT Ventricular Rate:  83 PR Interval:    QRS Duration:  92 QT Interval:  384 QTC Calculation: 452 R Axis:   17  Text Interpretation: Atrial fibrillation Borderline ST depression, lateral leads When compared with ECG of 12/02/2022, No significant change was found Confirmed by Dione Booze ([id number]) on 12/24/2022 11:49:39 PM  Radiology DG Chest 2 View  Result Date: 12/24/2022 CLINICAL DATA:  Shortness of breath EXAM: CHEST - 2 VIEW COMPARISON:  12/02/2022 FINDINGS: The heart size and mediastinal contours are within normal limits. Both lungs are clear. Aortic atherosclerosis. Diffuse ankylosis of the spine. IMPRESSION: No active cardiopulmonary disease. Electronically Signed   By: Jasmine Pang M.D.   On: 12/24/2022 23:10    Procedures Procedures    Medications Ordered in ED Medications  predniSONE (DELTASONE) tablet 60 mg (has no administration in time range)  ipratropium-albuterol (DUONEB) 0.5-2.5 (3) MG/3ML nebulizer solution 3 mL (3 mLs Nebulization Given 12/25/22 0240)  potassium chloride SA (KLOR-CON M) CR tablet 40 mEq (40 mEq Oral Given 12/25/22 0240)    ED Course/  Medical Decision Making/ A&P                                 Medical Decision Making Amount and/or Complexity of Data Reviewed Labs: ordered.  Risk Prescription drug management.   Cough and diarrhea in patient with recent episode of COVID-19.  I suspect that these are related to recent COVID infection.  I have reviewed his past records, and he was seen in the emergency department on 12/02/2022 for COVID-19 and treated with a 5-day course of Paxlovid.  Chest x-ray shows no evidence of pneumonia.  Have independently viewed the images, and agree with the radiologist's interpretation.  I have reviewed his electrocardiogram, and my interpretation is atrial fibrillation with borderline ST depression, unchanged from prior.  I have reviewed his laboratory test, and my interpretation is mild hyponatremia which is not felt to be clinically significant, mild hyponatremia which is actually improved over most recent value,  elevated random glucose level which had been present previously, normal CBC.  Since diarrhea seems to be subsiding, I do not feel he needs any additional medication for that.  I have ordered a nebulizer treatment with albuterol and ipratropium to see if that gives him some relief from his cough.  He states his cough is much better following nebulizer treatment.  I have ordered a dose of oral prednisone.  I am discharging him with a prescription for albuterol inhaler and a 5-day course of prednisone.  I have advised him to use over-the-counter bismuth subsalicylate if he has ongoing problems with diarrhea.  Return precautions discussed.  Final Clinical Impression(s) / ED Diagnoses Final diagnoses:  Acute cough  Diarrhea, unspecified type  Hypokalemia  Hyponatremia  Elevated random blood glucose level    Rx / DC Orders ED Discharge Orders          Ordered    albuterol (VENTOLIN HFA) 108 (90 Base) MCG/ACT inhaler  Every 4 hours PRN        12/25/22 0401    predniSONE (DELTASONE) 50 MG  tablet  Daily        12/25/22 0401              Dione Booze, MD 12/25/22 0403

## 2022-12-25 NOTE — Discharge Instructions (Addendum)
Use the inhaler every 4 hours as needed for your cough.  If you continue to have diarrhea, try taking Pepto-Bismol.  Just be aware, it will make your stool black.  Return if you are having any shortness of breath or any other new or concerning symptoms.

## 2023-01-05 ENCOUNTER — Other Ambulatory Visit (HOSPITAL_COMMUNITY): Payer: Self-pay

## 2023-01-09 ENCOUNTER — Encounter (HOSPITAL_COMMUNITY): Payer: Self-pay

## 2023-01-09 ENCOUNTER — Other Ambulatory Visit: Payer: Self-pay

## 2023-01-09 ENCOUNTER — Emergency Department (HOSPITAL_COMMUNITY)
Admission: EM | Admit: 2023-01-09 | Discharge: 2023-01-09 | Disposition: A | Discharge status: Home / Self Care | Payer: Medicare PPO | Attending: Emergency Medicine | Admitting: Emergency Medicine

## 2023-01-09 DIAGNOSIS — I10 Essential (primary) hypertension: Secondary | ICD-10-CM | POA: Diagnosis not present

## 2023-01-09 DIAGNOSIS — Z79899 Other long term (current) drug therapy: Secondary | ICD-10-CM | POA: Insufficient documentation

## 2023-01-09 DIAGNOSIS — R300 Dysuria: Secondary | ICD-10-CM | POA: Diagnosis not present

## 2023-01-09 DIAGNOSIS — R519 Headache, unspecified: Secondary | ICD-10-CM | POA: Diagnosis present

## 2023-01-09 LAB — COMPREHENSIVE METABOLIC PANEL
ALT: 18 U/L (ref 0–44)
AST: 21 U/L (ref 15–41)
Albumin: 4 g/dL (ref 3.5–5.0)
Alkaline Phosphatase: 119 U/L (ref 38–126)
Anion gap: 11 (ref 5–15)
BUN: 15 mg/dL (ref 8–23)
CO2: 28 mmol/L (ref 22–32)
Calcium: 8.9 mg/dL (ref 8.9–10.3)
Chloride: 98 mmol/L (ref 98–111)
Creatinine, Ser: 1 mg/dL (ref 0.61–1.24)
GFR, Estimated: >60 mL/min (ref >60)
Glucose, Bld: 155 mg/dL — ABNORMAL HIGH (ref 70–99)
Potassium: 3.5 mmol/L (ref 3.5–5.1)
Sodium: 137 mmol/L (ref 135–145)
Total Bilirubin: 0.5 mg/dL (ref 0.3–1.2)
Total Protein: 7.8 g/dL (ref 6.5–8.1)

## 2023-01-09 LAB — CBC
HCT: 43.3 % (ref 39.0–52.0)
Hemoglobin: 13.9 g/dL (ref 13.0–17.0)
MCH: 28.4 pg (ref 26.0–34.0)
MCHC: 32.1 g/dL (ref 30.0–36.0)
MCV: 88.4 fL (ref 80.0–100.0)
Platelets: 310 10*3/uL (ref 150–400)
RBC: 4.9 MIL/uL (ref 4.22–5.81)
RDW: 14.1 % (ref 11.5–15.5)
WBC: 11 10*3/uL — ABNORMAL HIGH (ref 4.0–10.5)
nRBC: 0 % (ref 0.0–0.2)

## 2023-01-09 LAB — URINALYSIS, ROUTINE W REFLEX MICROSCOPIC
Bacteria, UA: NONE SEEN
Bilirubin Urine: NEGATIVE
Glucose, UA: 50 mg/dL — AB
Ketones, ur: NEGATIVE mg/dL
Leukocytes,Ua: NEGATIVE
Nitrite: NEGATIVE
Protein, ur: NEGATIVE mg/dL
Specific Gravity, Urine: 1.003 — ABNORMAL LOW (ref 1.005–1.030)
pH: 8 (ref 5.0–8.0)

## 2023-01-09 MED ORDER — LABETALOL HCL 100 MG PO TABS
100.0000 mg | ORAL_TABLET | Freq: Once | ORAL | Status: AC
  Administered 2023-01-09: 100 mg via ORAL
  Filled 2023-01-09: qty 1

## 2023-01-09 MED ORDER — LABETALOL HCL 100 MG PO TABS: 100.0000 mg | ORAL_TABLET | Freq: Two times a day (BID) | ORAL | 1 refills | Status: DC

## 2023-01-09 NOTE — ED Notes (Signed)
[facility] notified for pt transport

## 2023-01-09 NOTE — Discharge Instructions (Signed)
Follow-up with your doctor this week for your blood pressure

## 2023-01-09 NOTE — ED Triage Notes (Signed)
Pt BIBA from [place] [facility], c/o headache and dry mouth.  Hx of hypertension, en route 224/110. A&Ox4.   BP 224/110 HR 88 O2 98

## 2023-01-11 NOTE — ED Provider Notes (Signed)
[place] EMERGENCY DEPARTMENT AT [facility] Provider Note   CSN: [id number] Arrival date & time: 01/09/23  1122     History  Chief Complaint  Patient presents with   Dysuria    Steven Simmons is a 82 y.o. male.  Patient complains of headache and dry mouth.  Patient has history of hypertension  The history is provided by the patient and medical records. No language interpreter was used.  Dysuria Context: not after injury   Relieved by:  Nothing Worsened by:  Nothing Ineffective treatments:  None tried Associated symptoms: no abdominal pain, no diarrhea, no hematuria and no urinary frequency   Risk factors: no bladder surgery        Home Medications Prior to Admission medications   Medication Sig Start Date End Date Taking? Authorizing Provider  labetalol (NORMODYNE) 100 MG tablet Take 1 tablet (100 mg total) by mouth 2 (two) times daily. 01/09/23  Yes Bethann Berkshire, MD  acetaminophen (TYLENOL) 325 MG tablet Take 2 tablets (650 mg total) by mouth every 6 (six) hours as needed for mild pain (or Fever >/= 101). 06/14/22   Marguerita Merles Latif, DO  albuterol (VENTOLIN HFA) 108 (90 Base) MCG/ACT inhaler Inhale 2 puffs into the lungs every 4 (four) hours as needed for wheezing or shortness of breath (or coughing). 12/25/22   Dione Booze, MD  diltiazem (CARDIZEM CD) 120 MG 24 hr capsule Take 1 capsule (120 mg total) by mouth daily. 03/03/22 06/10/22  Ernie Avena, MD  ipratropium-albuterol (DUONEB) 0.5-2.5 (3) MG/3ML SOLN Take 3 mLs by nebulization 2 (two) times daily as needed (for shortness of breath, coughing, or wheezing).    [provider]  lisinopril (ZESTRIL) 10 MG tablet Take 1 tablet (10 mg total) by mouth daily. 06/14/22 06/14/23  Marguerita Merles Latif, DO  loperamide (IMODIUM A-D) 2 MG tablet Take 4 mg by mouth See admin instructions. Take 4 mg by mouth at onset of diarrhea, then 2 mg after each subsequent stool- not to exceed 16 grams/24 hours; call MD if  no relief    [provider]  OXYGEN Inhale 1-5 L/min into the lungs See admin instructions. 1-5 L/min via nasal cannula and may titrate to keep sats >90%    [provider]  pantoprazole (PROTONIX) 40 MG tablet Take 1 tablet (40 mg total) by mouth daily. Patient taking differently: Take 40 mg by mouth in the morning. 05/20/22 06/19/22  Theron Arista, PA-C  polyethylene glycol powder (GLYCOLAX/MIRALAX) 17 GM/SCOOP powder Take 1 Container by mouth daily as needed for mild constipation (to be mixed into 8 ounces of water).    [provider]  potassium chloride SA (KLOR-CON M) 20 MEQ tablet Take 1 tablet (20 mEq total) by mouth daily. 12/02/22   Pricilla Loveless, MD  predniSONE (DELTASONE) 50 MG tablet Take 1 tablet (50 mg total) by mouth daily. 12/25/22   Dione Booze, MD  Skin Protectants, Misc. (EUCERIN) cream Apply 1 Application topically See admin instructions. Apply to the face in the morning and at bedtime    [provider]  sucralfate (CARAFATE) 1 g tablet Take 1 tablet (1 g total) by mouth 4 (four) times daily -  with meals and at bedtime. 06/14/22   Sheikh, Kateri Mc Latif, DO  TUMS 500 MG chewable tablet Chew 2 tablets by mouth 3 (three) times daily as needed for indigestion.    [provider]  ZANTAC 360 10 MG tablet Take 10 mg by mouth in the morning.  [provider]      Allergies    Ceclor [cefaclor]; Veralipride; Penicillins; Amlodipine; Antihistamines, chlorpheniramine-type; Beta adrenergic blockers; Celebrex [celecoxib]; Ibuprofen; Lipitor [atorvastatin]; and Nutrasweet aspartame [aspartame]    Review of Systems   Review of Systems  Constitutional:  Negative for appetite change and fatigue.  HENT:  Negative for congestion, ear discharge and sinus pressure.        Dry mouth  Eyes:  Negative for discharge.  Respiratory:  Negative for cough.   Cardiovascular:  Negative for chest pain.  Gastrointestinal:  Negative for abdominal pain  and diarrhea.  Genitourinary:  Negative for frequency and hematuria.  Musculoskeletal:  Negative for back pain.  Skin:  Negative for rash.  Neurological:  Positive for headaches. Negative for seizures.  Psychiatric/Behavioral:  Negative for hallucinations.     Physical Exam Updated Vital Signs BP (!) 188/76   Pulse 76   Temp 98 F (36.7 C)   Resp 18   Ht 5\' 11"  (1.803 m)   Wt 97.5 kg   SpO2 94%   BMI 29.99 kg/m  Physical Exam Vitals and nursing note reviewed.  Constitutional:      Appearance: He is well-developed.  HENT:     Head: Normocephalic.     Nose: Nose normal.  Eyes:     General: No scleral icterus.    Conjunctiva/sclera: Conjunctivae normal.  Neck:     Thyroid: No thyromegaly.  Cardiovascular:     Rate and Rhythm: Normal rate and regular rhythm.     Heart sounds: No murmur heard.    No friction rub. No gallop.  Pulmonary:     Breath sounds: No stridor. No wheezing or rales.  Chest:     Chest wall: No tenderness.  Abdominal:     General: There is no distension.     Tenderness: There is no abdominal tenderness. There is no rebound.  Musculoskeletal:        General: Normal range of motion.     Cervical back: Neck supple.  Lymphadenopathy:     Cervical: No cervical adenopathy.  Skin:    Findings: No erythema or rash.  Neurological:     Mental Status: He is alert and oriented to person, place, and time.     Motor: No abnormal muscle tone.     Coordination: Coordination normal.  Psychiatric:        Behavior: Behavior normal.     ED Results / Procedures / Treatments   Labs (all labs ordered are listed, but only abnormal results are displayed) Labs Reviewed  CBC - Abnormal; Notable for the following components:      Result Value   WBC 11.0 (*)    All other components within normal limits  COMPREHENSIVE METABOLIC PANEL - Abnormal; Notable for the following components:   Glucose, Bld 155 (*)    All other components within normal limits  URINALYSIS,  ROUTINE W REFLEX MICROSCOPIC - Abnormal; Notable for the following components:   Color, Urine COLORLESS (*)    Specific Gravity, Urine 1.003 (*)    Glucose, UA 50 (*)    Hgb urine dipstick SMALL (*)    All other components within normal limits    EKG None  Radiology No results found.  Procedures Procedures    Medications Ordered in ED Medications  labetalol (NORMODYNE) tablet 100 mg (100 mg Oral Given 01/09/23 1457)    ED Course/ Medical Decision Making/ A&P  Medical Decision Making Amount and/or Complexity of Data Reviewed Labs: ordered.  Risk Prescription drug management.   Patient with poorly controlled blood pressure.  Will start him on labetalol 100 mg twice a day and he will follow-up with his PCP        Final Clinical Impression(s) / ED Diagnoses Final diagnoses:  Hypertension, unspecified type    Rx / DC Orders ED Discharge Orders          Ordered    labetalol (NORMODYNE) 100 MG tablet  2 times daily        01/09/23 1442              Bethann Berkshire, MD 01/11/23 1232

## 2023-01-13 ENCOUNTER — Ambulatory Visit (INDEPENDENT_AMBULATORY_CARE_PROVIDER_SITE_OTHER): Payer: Medicare PPO | Admitting: Nurse Practitioner

## 2023-01-13 ENCOUNTER — Encounter: Payer: Self-pay | Admitting: Nurse Practitioner

## 2023-01-13 VITALS — BP 140/90 | HR 86 | Ht 71.0 in | Wt 225.0 lb

## 2023-01-13 DIAGNOSIS — R1084 Generalized abdominal pain: Secondary | ICD-10-CM

## 2023-01-13 DIAGNOSIS — Z800 Family history of malignant neoplasm of digestive organs: Secondary | ICD-10-CM

## 2023-01-13 DIAGNOSIS — Z860101 Personal history of adenomatous and serrated colon polyps: Secondary | ICD-10-CM | POA: Diagnosis not present

## 2023-01-13 MED ORDER — FAMOTIDINE 20 MG PO TABS: 20.0000 mg | ORAL_TABLET | Freq: Every day | ORAL | 4 refills | Status: AC

## 2023-01-13 NOTE — Patient Instructions (Addendum)
_______________________________________________________  If your blood pressure at your visit was 140/90 or greater, please contact your primary care physician to follow up on this.  _______________________________________________________  If you are age 80 or older, your body mass index should be between 23-30. Your Body mass index is 31.38 kg/m. If this is out of the aforementioned range listed, please consider follow up with your Primary Care Provider.  If you are age 20 or younger, your body mass index should be between 19-25. Your Body mass index is 31.38 kg/m. If this is out of the aformentioned range listed, please consider follow up with your Primary Care Provider.   ________________________________________________________  The [place] [facility] providers would like to encourage you to use [facility] to communicate with providers for non-urgent requests or questions.  Due to long hold times on the telephone, sending your provider a message by [facility] may be a faster and more efficient way to get a response.  Please allow 48 business hours for a response.  Please remember that this is for non-urgent requests.  _______________________________________________________  A script for pepcid has been given to you.  Continue Protonix 30 minutes before breakfast  You have been scheduled for an appointment with Dr. Leone Payor on 04-18-2023 at 10:50am . Please arrive 10 minutes early for your appointment.  It was a pleasure to see you today!  Thank you for trusting me with your gastrointestinal care!

## 2023-01-13 NOTE — Progress Notes (Signed)
ASSESSMENT    Brief Narrative:  82 y.o.  male known to Dr. Elberta Leatherwood  with a past medical history not limited to GERD, remote PUD, AFIB, TIA, HTN, colon polyps, diverticulosis,    Intermittent generalized abdominal discomfort, ? Etiology.  Unremarkable labs and CT AP with contrast for abdominal pain late July. CTA a year ago for bleeding showed no stenosis of IMA , SMA. His weight is stable  FMH of colon cancer in Father / personal history of colon polyps ( 2 sessile serrated polyps < 1 cm) in July 2019.  No recall colonoscopy due to age.   See PMH below for additional history  PLAN   --Continue pantoprazole 40 mg before breakfast --He wants to resume OTC " Zantac" (which is Famotidne). Take Famotidine 20 mg at bedtime --Follow up in January.   HPI   Chief complaint : ED follow up . Generalized abdominal discomfort. Wants famotidine resumed.   We last saw patient in the hospital in June 2023 for painless rectal bleeding thought to be a diverticular hemorrhage.  CTA without acute findings. Also, IMA patent, SMA with mild atherosclerotic calcification at origin , no stenosis.   Patient has frequent ED visits. Seen  in ED 7/29 for abdominal pain . CT with contrast was scan unrevealing.  ED made this appointment for him. Since then has gone back to ED on 12/25/22 with diarrhrea and cough thought to be 2/2 to recent COVID19 infection. Back to ED again on 10/6 with headaches and dry mouth  INTERVAL HISTORY  Mr Wanzer complains of intermittent generalized abdominal discomfort. The discomfort migrates around the abdomen. He eats a bland diet, doesn't correlate symptoms to eating.  He cannot say for sure how long the pain has been present.  Tums helps and belching helps. CT scan and labs in ED 7/29 for abdominal pain was unremarkable. Bowel movements are normal. Mr Hennon tells me that he was taking Zantac ( OTC) but it was recently stopped. He wants it restarted because if helps  the  abdominal pain. According to the med list from [facility] :Place he is on pantoprazole 40 mg daily. Not having any acid reflux symptoms. No blood in stool. No black stools.  Wakes up with a dry mouth, dry throat despite drinking a lot of water.   In ED 7/29 for abdominal pain .  LFTs normal.  WBC 11, hgb 13.9.  Lipase was normal.  CT AP with contrast - no acute findings   GI History / Pertinent GI Studies   **All endoscopic studies may not be included here    Screening Colonoscopy July 2019 - Two 4 to 8 mm polyps in the transverse colon and in the ascending colon, removed with a cold snare. Resected and retrieved. - Severe diverticulosis in the left colon and in the right colon. - The examination was otherwise normal on direct and retroflexion views.      Latest Ref Rng & Units 01/09/2023   11:55 AM 12/24/2022    9:11 PM 12/02/2022    9:58 AM  Hepatic Function  Total Protein 6.5 - 8.1 g/dL 7.8  7.7  7.9   Albumin 3.5 - 5.0 g/dL 4.0  3.8  3.8   AST 15 - 41 U/L 21  19  22    ALT 0 - 44 U/L 18  18  18    Alk Phosphatase 38 - 126 U/L 119  111  102   Total Bilirubin 0.3 - 1.2 mg/dL 0.5  0.5  0.9        Latest Ref Rng & Units 01/09/2023   11:55 AM 12/24/2022    9:11 PM 12/02/2022    9:58 AM  CBC  WBC 4.0 - 10.5 K/uL 11.0  7.5  8.1   Hemoglobin 13.0 - 17.0 g/dL 59.5  63.8  75.6   Hematocrit 39.0 - 52.0 % 43.3  42.8  39.1   Platelets 150 - 400 K/uL 310  297  269      Past Medical History:  Diagnosis Date   Allergy    Arthritis    shoulders (08/23/2017)   Aspiration pneumonia (HCC) 08/23/2017   Bleeding stomach ulcer 1988   Cataract    Complication of anesthesia 1993   " dont' know the name of it but they gave it to me at [facility] in 1993 "   I was cold, shaking and had a headache for hours afterwards "   GERD (gastroesophageal reflux disease)    Heart murmur    "when I was a child"   History of blood transfusion 1988   "bleeding ulcer"     History of TIA (transient ischemic  attack) 07/2015   pt denies this hx on 08/23/2017   Hypertension    Migraine 1959-1980   "did have 4 ~ 2 wks ago; just the aura then" (08/23/2017)   Peptic ulcer disease    Sepsis secondary to UTI (HCC) 08/22/2017    Past Surgical History:  Procedure Laterality Date   CATARACT EXTRACTION W/ INTRAOCULAR LENS  IMPLANT, BILATERAL Bilateral    COLONOSCOPY  2012 ?   EXCISIONAL HEMORRHOIDECTOMY  1993   LAPAROSCOPIC CHOLECYSTECTOMY      Family History  Problem Relation Age of Onset   Heart disease Mother    Hypertension Mother    Heart disease Father    Hypertension Father    Colon cancer Father    Esophageal cancer Neg Hx    Rectal cancer Neg Hx    Stomach cancer Neg Hx     Current Medications, Allergies, Family History and Social History were reviewed in [employer] record.     Current Outpatient Medications  Medication Sig Dispense Refill   acetaminophen (TYLENOL) 325 MG tablet Take 2 tablets (650 mg total) by mouth every 6 (six) hours as needed for mild pain (or Fever >/= 101). 20 tablet 0   albuterol (VENTOLIN HFA) 108 (90 Base) MCG/ACT inhaler Inhale 2 puffs into the lungs every 4 (four) hours as needed for wheezing or shortness of breath (or coughing). 6.7 g 0   lisinopril (ZESTRIL) 10 MG tablet Take 1 tablet (10 mg total) by mouth daily. 30 tablet 11   OXYGEN Inhale 1-5 L/min into the lungs See admin instructions. 1-5 L/min via nasal cannula and may titrate to keep sats >90%     polyethylene glycol powder (GLYCOLAX/MIRALAX) 17 GM/SCOOP powder Take 1 Container by mouth daily as needed for mild constipation (to be mixed into 8 ounces of water).     predniSONE (DELTASONE) 50 MG tablet Take 1 tablet (50 mg total) by mouth daily. 5 tablet 0   diltiazem (CARDIZEM CD) 120 MG 24 hr capsule Take 1 capsule (120 mg total) by mouth daily. 30 capsule 0   ipratropium-albuterol (DUONEB) 0.5-2.5 (3) MG/3ML SOLN Take 3 mLs by nebulization 2 (two) times daily as needed  (for shortness of breath, coughing, or wheezing). (Patient not taking: Reported on 01/13/2023)     labetalol (NORMODYNE) 100 MG tablet  Take 1 tablet (100 mg total) by mouth 2 (two) times daily. (Patient not taking: Reported on 01/13/2023) 60 tablet 1   loperamide (IMODIUM A-D) 2 MG tablet Take 4 mg by mouth See admin instructions. Take 4 mg by mouth at onset of diarrhea, then 2 mg after each subsequent stool- not to exceed 16 grams/24 hours; call MD if no relief (Patient not taking: Reported on 01/13/2023)     pantoprazole (PROTONIX) 40 MG tablet Take 1 tablet (40 mg total) by mouth daily. (Patient taking differently: Take 40 mg by mouth in the morning.) 30 tablet 0   potassium chloride SA (KLOR-CON M) 20 MEQ tablet Take 1 tablet (20 mEq total) by mouth daily. (Patient not taking: Reported on 01/13/2023) 3 tablet 0   Skin Protectants, Misc. (EUCERIN) cream Apply 1 Application topically See admin instructions. Apply to the face in the morning and at bedtime (Patient not taking: Reported on 01/13/2023)     sucralfate (CARAFATE) 1 g tablet Take 1 tablet (1 g total) by mouth 4 (four) times daily -  with meals and at bedtime. (Patient not taking: Reported on 01/13/2023) 28 tablet 0   TUMS 500 MG chewable tablet Chew 2 tablets by mouth 3 (three) times daily as needed for indigestion. (Patient not taking: Reported on 01/13/2023)     ZANTAC 360 10 MG tablet Take 10 mg by mouth in the morning. (Patient not taking: Reported on 01/13/2023)     No current facility-administered medications for this visit.    Review of Systems: No chest pain. No shortness of breath. No urinary complaints.    Physical Exam  Wt Readings from Last 3 Encounters:  01/13/23 225 lb (102.1 kg)  01/09/23 215 lb (97.5 kg)  10/30/22 218 lb 4.1 oz (99 kg)    BP (!) 140/90   Pulse 86   Ht 5\' 11"  (1.803 m)   Wt 225 lb (102.1 kg)   BMI 31.38 kg/m  Constitutional:  Pleasant, generally well appearing male in no acute distress in a  wheelchair. Psychiatric: Normal mood and affect. Behavior is normal. EENT: Pupils normal.  Conjunctivae are normal. No scleral icterus. Neck supple.  Cardiovascular: Normal rate, regular rhythm.  Pulmonary/chest: Effort normal and breath sounds normal. No wheezing, rales or rhonchi. Abdominal: Limited exam in wheelchair, soft, nondistended, nontender. Bowel sounds active throughout. There are no masses palpable. No hepatomegaly. Neurological: Alert and oriented to person place and time.    Willette Cluster, NP  01/13/2023, 2:51 PM

## 2023-01-17 ENCOUNTER — Encounter: Payer: Self-pay | Admitting: Nurse Practitioner

## 2023-01-17 ENCOUNTER — Other Ambulatory Visit (HOSPITAL_COMMUNITY): Payer: Self-pay

## 2023-01-17 ENCOUNTER — Other Ambulatory Visit: Payer: Self-pay

## 2023-01-17 ENCOUNTER — Encounter (HOSPITAL_COMMUNITY): Payer: Self-pay

## 2023-01-17 ENCOUNTER — Emergency Department (HOSPITAL_COMMUNITY)
Admission: EM | Admit: 2023-01-17 | Discharge: 2023-01-17 | Disposition: A | Discharge status: Home Health | Payer: Medicare PPO | Attending: Emergency Medicine | Admitting: Emergency Medicine

## 2023-01-17 DIAGNOSIS — E876 Hypokalemia: Secondary | ICD-10-CM | POA: Diagnosis not present

## 2023-01-17 DIAGNOSIS — I10 Essential (primary) hypertension: Secondary | ICD-10-CM | POA: Diagnosis not present

## 2023-01-17 DIAGNOSIS — Z79899 Other long term (current) drug therapy: Secondary | ICD-10-CM | POA: Insufficient documentation

## 2023-01-17 DIAGNOSIS — I482 Chronic atrial fibrillation, unspecified: Secondary | ICD-10-CM | POA: Insufficient documentation

## 2023-01-17 DIAGNOSIS — K529 Noninfective gastroenteritis and colitis, unspecified: Secondary | ICD-10-CM | POA: Diagnosis not present

## 2023-01-17 DIAGNOSIS — D649 Anemia, unspecified: Secondary | ICD-10-CM | POA: Insufficient documentation

## 2023-01-17 DIAGNOSIS — R197 Diarrhea, unspecified: Secondary | ICD-10-CM | POA: Diagnosis present

## 2023-01-17 DIAGNOSIS — R739 Hyperglycemia, unspecified: Secondary | ICD-10-CM

## 2023-01-17 LAB — CBC WITH DIFFERENTIAL/PLATELET
Abs Immature Granulocytes: 0.03 10*3/uL (ref 0.00–0.07)
Basophils Absolute: 0 10*3/uL (ref 0.0–0.1)
Basophils Relative: 0 %
Eosinophils Absolute: 0.1 10*3/uL (ref 0.0–0.5)
Eosinophils Relative: 1 %
HCT: 36.4 % — ABNORMAL LOW (ref 39.0–52.0)
Hemoglobin: 11.7 g/dL — ABNORMAL LOW (ref 13.0–17.0)
Immature Granulocytes: 0 %
Lymphocytes Relative: 12 %
Lymphs Abs: 0.9 10*3/uL (ref 0.7–4.0)
MCH: 28.5 pg (ref 26.0–34.0)
MCHC: 32.1 g/dL (ref 30.0–36.0)
MCV: 88.6 fL (ref 80.0–100.0)
Monocytes Absolute: 0.6 10*3/uL (ref 0.1–1.0)
Monocytes Relative: 7 %
Neutro Abs: 6.2 10*3/uL (ref 1.7–7.7)
Neutrophils Relative %: 80 %
Platelets: 229 10*3/uL (ref 150–400)
RBC: 4.11 MIL/uL — ABNORMAL LOW (ref 4.22–5.81)
RDW: 14.6 % (ref 11.5–15.5)
WBC: 7.8 10*3/uL (ref 4.0–10.5)
nRBC: 0 % (ref 0.0–0.2)

## 2023-01-17 LAB — BASIC METABOLIC PANEL
Anion gap: 10 (ref 5–15)
BUN: 13 mg/dL (ref 8–23)
CO2: 24 mmol/L (ref 22–32)
Calcium: 8.6 mg/dL — ABNORMAL LOW (ref 8.9–10.3)
Chloride: 101 mmol/L (ref 98–111)
Creatinine, Ser: 0.96 mg/dL (ref 0.61–1.24)
GFR, Estimated: >60 mL/min (ref >60)
Glucose, Bld: 147 mg/dL — ABNORMAL HIGH (ref 70–99)
Potassium: 3.1 mmol/L — ABNORMAL LOW (ref 3.5–5.1)
Sodium: 135 mmol/L (ref 135–145)

## 2023-01-17 MED ORDER — LABETALOL HCL 100 MG PO TABS
100.0000 mg | ORAL_TABLET | Freq: Two times a day (BID) | ORAL | 1 refills | Status: AC
  Filled 2023-01-17: qty 60, 30d supply, fill #0

## 2023-01-17 MED ORDER — POTASSIUM CHLORIDE CRYS ER 20 MEQ PO TBCR
40.0000 meq | EXTENDED_RELEASE_TABLET | Freq: Once | ORAL | Status: AC
  Administered 2023-01-17: 40 meq via ORAL
  Filled 2023-01-17: qty 2

## 2023-01-17 MED ORDER — POTASSIUM CHLORIDE CRYS ER 20 MEQ PO TBCR
EXTENDED_RELEASE_TABLET | ORAL | 0 refills | Status: AC
  Filled 2023-01-17: qty 40, 30d supply, fill #0

## 2023-01-17 MED ORDER — POTASSIUM CHLORIDE CRYS ER 20 MEQ PO TBCR: EXTENDED_RELEASE_TABLET | ORAL | 0 refills | Status: DC

## 2023-01-17 MED ORDER — ONDANSETRON 4 MG PO TBDP
4.0000 mg | ORAL_TABLET | Freq: Once | ORAL | Status: DC
  Filled 2023-01-17: qty 1

## 2023-01-17 MED ORDER — LABETALOL HCL 100 MG PO TABS
100.0000 mg | ORAL_TABLET | Freq: Once | ORAL | Status: AC
  Administered 2023-01-17: 100 mg via ORAL
  Filled 2023-01-17: qty 1

## 2023-01-17 NOTE — ED Triage Notes (Signed)
EMS Patient stated that he is having headaches. The patient took Tylenol and is non compliant with meds at the facility. Patient had Covid Vaccine yesterday.  Vitals within normal HX of Afib

## 2023-01-17 NOTE — ED Notes (Signed)
[facility] was contacted.

## 2023-01-17 NOTE — ED Provider Notes (Signed)
[place] EMERGENCY DEPARTMENT AT [facility] Provider Note   CSN: [id number] Arrival date & time: 01/17/23  4098     History  No chief complaint on file.   Steven Simmons is a 82 y.o. male.  The history is provided by the patient.  He has history of hypertension, hyperlipidemia, GERD, peptic ulcer disease, atrial fibrillation not on anticoagulation because of history of GI bleeding and comes in complaining that his blood pressure medicine is not working.  He was seen here recently and was put on a new blood pressure medicine which she says worked great while he was here, but states that his facility where he lives was unable to get it for several days and he thinks it is a different medication that he was given and it is not working.  He states that he has a dry mouth and is drinking a lot of water and he is urinating very frequently and he has been having nausea and diarrhea.  He states he has 2-3 loose to watery bowel movements a day and this has been an ongoing issue.  On further questioning, those symptoms actually preceded the medication change.  The patient has multiple complaints which seem to center on the fact that he is not happy with the facility where he is staying.  He is requesting that prescription be sent to another pharmacy where he can get his other medicines that have being given the medicine that is from the facility.     Home Medications Prior to Admission medications   Medication Sig Start Date End Date Taking? Authorizing Provider  acetaminophen (TYLENOL) 325 MG tablet Take 2 tablets (650 mg total) by mouth every 6 (six) hours as needed for mild pain (or Fever >/= 101). 06/14/22   Marguerita Merles Latif, DO  albuterol (VENTOLIN HFA) 108 (90 Base) MCG/ACT inhaler Inhale 2 puffs into the lungs every 4 (four) hours as needed for wheezing or shortness of breath (or coughing). 12/25/22   Dione Booze, MD  diltiazem (CARDIZEM CD) 120 MG 24 hr capsule Take 1 capsule  (120 mg total) by mouth daily. 03/03/22 06/10/22  Ernie Avena, MD  famotidine (PEPCID) 20 MG tablet Take 1 tablet (20 mg total) by mouth at bedtime. 01/13/23   Meredith Pel, NP  ipratropium-albuterol (DUONEB) 0.5-2.5 (3) MG/3ML SOLN Take 3 mLs by nebulization 2 (two) times daily as needed (for shortness of breath, coughing, or wheezing). Patient not taking: Reported on 01/13/2023    [provider]  labetalol (NORMODYNE) 100 MG tablet Take 1 tablet (100 mg total) by mouth 2 (two) times daily. Patient not taking: Reported on 01/13/2023 01/09/23   Bethann Berkshire, MD  lisinopril (ZESTRIL) 10 MG tablet Take 1 tablet (10 mg total) by mouth daily. 06/14/22 06/14/23  Marguerita Merles Latif, DO  loperamide (IMODIUM A-D) 2 MG tablet Take 4 mg by mouth See admin instructions. Take 4 mg by mouth at onset of diarrhea, then 2 mg after each subsequent stool- not to exceed 16 grams/24 hours; call MD if no relief Patient not taking: Reported on 01/13/2023    [provider]  OXYGEN Inhale 1-5 L/min into the lungs See admin instructions. 1-5 L/min via nasal cannula and may titrate to keep sats >90%    [provider]  pantoprazole (PROTONIX) 40 MG tablet Take 1 tablet (40 mg total) by mouth daily. Patient taking differently: Take 40 mg by mouth in the morning. 05/20/22 06/19/22  Theron Arista, PA-C  polyethylene glycol  powder (GLYCOLAX/MIRALAX) 17 GM/SCOOP powder Take 1 Container by mouth daily as needed for mild constipation (to be mixed into 8 ounces of water).    [provider]  potassium chloride SA (KLOR-CON M) 20 MEQ tablet Take 1 tablet (20 mEq total) by mouth daily. Patient not taking: Reported on 01/13/2023 12/02/22   Pricilla Loveless, MD  predniSONE (DELTASONE) 50 MG tablet Take 1 tablet (50 mg total) by mouth daily. 12/25/22   Dione Booze, MD  Skin Protectants, Misc. (EUCERIN) cream Apply 1 Application topically See admin instructions. Apply to the face in the morning and at  bedtime Patient not taking: Reported on 01/13/2023    [provider]  sucralfate (CARAFATE) 1 g tablet Take 1 tablet (1 g total) by mouth 4 (four) times daily -  with meals and at bedtime. Patient not taking: Reported on 01/13/2023 06/14/22   Marguerita Merles Latif, DO  TUMS 500 MG chewable tablet Chew 2 tablets by mouth 3 (three) times daily as needed for indigestion. Patient not taking: Reported on 01/13/2023    [provider]  ZANTAC 360 10 MG tablet Take 10 mg by mouth in the morning. Patient not taking: Reported on 01/13/2023    [provider]      Allergies    Ceclor [cefaclor]; Veralipride; Penicillins; Amlodipine; Antihistamines, chlorpheniramine-type; Beta adrenergic blockers; Celebrex [celecoxib]; Ibuprofen; Lipitor [atorvastatin]; and Nutrasweet aspartame [aspartame]    Review of Systems   Review of Systems  All other systems reviewed and are negative.   Physical Exam Updated Vital Signs BP (!) 184/166   Pulse 86   Temp 99.3 F (37.4 C) (Oral)   Resp (!) 22   SpO2 91%  Physical Exam Vitals and nursing note reviewed.   82 year old male, resting comfortably and in no acute distress. Vital signs are significant for elevated blood pressure. Oxygen saturation is 91%, which is normal. Head is normocephalic and atraumatic. PERRLA, EOMI. Mucous moist. Neck is nontender and supple without adenopathy or JVD. Back is nontender and there is no CVA tenderness. Lungs are clear without rales, wheezes, or rhonchi. Chest is nontender. Heart has regular rate and rhythm without murmur. Abdomen is soft, flat, nontender. Extremities have no cyanosis or edema. Skin is warm and dry without rash. Neurologic: Moves all extremities equally.  ED Results / Procedures / Treatments   Labs (all labs ordered are listed, but only abnormal results are displayed) Labs Reviewed - No data to display  EKG EKG Interpretation Date/Time:  Monday January 17 2023 01:48:09  EDT Ventricular Rate:  80 PR Interval:    QRS Duration:  97 QT Interval:  406 QTC Calculation: 469 R Axis:   12  Text Interpretation: Atrial fibrillation When compared with ECG of 12/24/2022, No significant change was found Confirmed by Dione Booze ([id number]) on 01/17/2023 2:08:14 AM  Procedures Procedures    Medications Ordered in ED Medications  ondansetron (ZOFRAN-ODT) disintegrating tablet 4 mg (4 mg Oral Patient Refused/Not Given 01/17/23 0250)  potassium chloride SA (KLOR-CON M) CR tablet 40 mEq (has no administration in time range)    ED Course/ Medical Decision Making/ A&P                                 Medical Decision Making Amount and/or Complexity of Data Reviewed Labs: ordered.  Risk Prescription drug management.   Chronic nausea and diarrhea.  Multiple complaints centering around general dissatisfaction with his  living arrangement.  I have reviewed his past records, and he was seen in the ED on 01/09/2019 for elevated blood pressure, headache, dry mouth.  He was given a dose of labetalol and discharged with a prescription for labetalol.  I have reviewed his medication list from his facility and he is getting labetalol in the same dose.  I have explained to the patient that I am not able to fix the problems he is having with his facility.  I have ordered CBC and basic metabolic panel to make sure that he is not getting hypokalemic with GI losses of potassium and also to make sure renal function is normal.  Glucose has been mildly elevated on every blood draw on record since 2020, but has not been high enough to cause polyuria and polydipsia.  I have reviewed his laboratory test, and my interpretation is hypokalemia likely diuretic induced, elevated random glucose level and range that he has been at, not a range requiring initiation of medication, mild anemia which is new compared with 01/09/2023, but in a range that he has been as recently as 06/14/2022.  I have ordered oral  potassium for him.  Addressing his concerns at the pharmacy where he is staying is not giving him the right medication, I have written new prescriptions for labetalol and oral potassium and send it to the pharmacy that he has requested.  I have informed him that he will need to work with his primary care provider to make appropriate adjustments in his blood pressure regimen as well as manage his chronic diarrhea.  Final Clinical Impression(s) / ED Diagnoses Final diagnoses:  Elevated blood pressure reading with diagnosis of hypertension  Diuretic-induced hypokalemia  Chronic diarrhea  Elevated random blood glucose level  Normochromic normocytic anemia  Chronic atrial fibrillation (HCC)    Rx / DC Orders ED Discharge Orders          Ordered    labetalol (NORMODYNE) 100 MG tablet  2 times daily        01/17/23 0330    potassium chloride SA (KLOR-CON M) 20 MEQ tablet  Status:  Discontinued        01/17/23 0330    potassium chloride SA (KLOR-CON M) 20 MEQ tablet        01/17/23 0330              Dione Booze, MD 01/17/23 [phone]

## 2023-01-17 NOTE — Discharge Instructions (Addendum)
You may take loperamide (Imodium A-D) as needed for diarrhea.  You need to work with your primary care provider to adjust your medication to get adequate control of your blood pressure.

## 2023-01-28 ENCOUNTER — Other Ambulatory Visit (HOSPITAL_COMMUNITY): Payer: Self-pay

## 2023-03-21 DIAGNOSIS — I480 Paroxysmal atrial fibrillation: Secondary | ICD-10-CM | POA: Diagnosis not present

## 2023-03-21 DIAGNOSIS — I10 Essential (primary) hypertension: Secondary | ICD-10-CM | POA: Diagnosis not present

## 2023-04-14 DIAGNOSIS — M6281 Muscle weakness (generalized): Secondary | ICD-10-CM | POA: Diagnosis not present

## 2023-04-14 DIAGNOSIS — I10 Essential (primary) hypertension: Secondary | ICD-10-CM | POA: Diagnosis not present

## 2023-04-14 DIAGNOSIS — K219 Gastro-esophageal reflux disease without esophagitis: Secondary | ICD-10-CM | POA: Diagnosis not present

## 2023-04-14 DIAGNOSIS — E559 Vitamin D deficiency, unspecified: Secondary | ICD-10-CM | POA: Diagnosis not present

## 2023-04-14 DIAGNOSIS — I4891 Unspecified atrial fibrillation: Secondary | ICD-10-CM | POA: Diagnosis not present

## 2023-04-14 DIAGNOSIS — J449 Chronic obstructive pulmonary disease, unspecified: Secondary | ICD-10-CM | POA: Diagnosis not present

## 2023-04-14 DIAGNOSIS — K5790 Diverticulosis of intestine, part unspecified, without perforation or abscess without bleeding: Secondary | ICD-10-CM | POA: Diagnosis not present

## 2023-04-14 DIAGNOSIS — E119 Type 2 diabetes mellitus without complications: Secondary | ICD-10-CM | POA: Diagnosis not present

## 2023-04-15 DIAGNOSIS — E119 Type 2 diabetes mellitus without complications: Secondary | ICD-10-CM | POA: Diagnosis not present

## 2023-04-15 DIAGNOSIS — E559 Vitamin D deficiency, unspecified: Secondary | ICD-10-CM | POA: Diagnosis not present

## 2023-04-18 ENCOUNTER — Ambulatory Visit: Payer: Medicare Other | Admitting: Internal Medicine

## 2023-04-28 DIAGNOSIS — N390 Urinary tract infection, site not specified: Secondary | ICD-10-CM | POA: Diagnosis not present

## 2023-04-30 DIAGNOSIS — N390 Urinary tract infection, site not specified: Secondary | ICD-10-CM | POA: Diagnosis not present

## 2023-05-04 DIAGNOSIS — N390 Urinary tract infection, site not specified: Secondary | ICD-10-CM | POA: Diagnosis not present

## 2023-05-04 DIAGNOSIS — I10 Essential (primary) hypertension: Secondary | ICD-10-CM | POA: Diagnosis not present

## 2023-05-13 DIAGNOSIS — J449 Chronic obstructive pulmonary disease, unspecified: Secondary | ICD-10-CM | POA: Diagnosis not present

## 2023-05-13 DIAGNOSIS — I4891 Unspecified atrial fibrillation: Secondary | ICD-10-CM | POA: Diagnosis not present

## 2023-05-13 DIAGNOSIS — K219 Gastro-esophageal reflux disease without esophagitis: Secondary | ICD-10-CM | POA: Diagnosis not present

## 2023-05-13 DIAGNOSIS — I10 Essential (primary) hypertension: Secondary | ICD-10-CM | POA: Diagnosis not present

## 2023-05-13 DIAGNOSIS — K5790 Diverticulosis of intestine, part unspecified, without perforation or abscess without bleeding: Secondary | ICD-10-CM | POA: Diagnosis not present

## 2023-05-13 DIAGNOSIS — M6281 Muscle weakness (generalized): Secondary | ICD-10-CM | POA: Diagnosis not present

## 2023-05-13 DIAGNOSIS — E119 Type 2 diabetes mellitus without complications: Secondary | ICD-10-CM | POA: Diagnosis not present

## 2023-05-13 DIAGNOSIS — E559 Vitamin D deficiency, unspecified: Secondary | ICD-10-CM | POA: Diagnosis not present

## 2023-05-19 DIAGNOSIS — Z0000 Encounter for general adult medical examination without abnormal findings: Secondary | ICD-10-CM | POA: Diagnosis not present

## 2023-06-02 DIAGNOSIS — I10 Essential (primary) hypertension: Secondary | ICD-10-CM | POA: Diagnosis not present

## 2023-06-02 DIAGNOSIS — J449 Chronic obstructive pulmonary disease, unspecified: Secondary | ICD-10-CM | POA: Diagnosis not present

## 2023-06-03 DIAGNOSIS — M6281 Muscle weakness (generalized): Secondary | ICD-10-CM | POA: Diagnosis not present

## 2023-06-03 DIAGNOSIS — E119 Type 2 diabetes mellitus without complications: Secondary | ICD-10-CM | POA: Diagnosis not present

## 2023-06-03 DIAGNOSIS — E559 Vitamin D deficiency, unspecified: Secondary | ICD-10-CM | POA: Diagnosis not present

## 2023-06-03 DIAGNOSIS — J449 Chronic obstructive pulmonary disease, unspecified: Secondary | ICD-10-CM | POA: Diagnosis not present

## 2023-06-03 DIAGNOSIS — I4891 Unspecified atrial fibrillation: Secondary | ICD-10-CM | POA: Diagnosis not present

## 2023-06-03 DIAGNOSIS — I10 Essential (primary) hypertension: Secondary | ICD-10-CM | POA: Diagnosis not present

## 2023-06-03 DIAGNOSIS — R21 Rash and other nonspecific skin eruption: Secondary | ICD-10-CM | POA: Diagnosis not present

## 2023-06-10 DIAGNOSIS — R41841 Cognitive communication deficit: Secondary | ICD-10-CM | POA: Diagnosis not present

## 2023-06-10 DIAGNOSIS — R278 Other lack of coordination: Secondary | ICD-10-CM | POA: Diagnosis not present

## 2023-06-10 DIAGNOSIS — M6281 Muscle weakness (generalized): Secondary | ICD-10-CM | POA: Diagnosis not present

## 2023-06-11 DIAGNOSIS — M6281 Muscle weakness (generalized): Secondary | ICD-10-CM | POA: Diagnosis not present

## 2023-06-11 DIAGNOSIS — R41841 Cognitive communication deficit: Secondary | ICD-10-CM | POA: Diagnosis not present

## 2023-06-11 DIAGNOSIS — R278 Other lack of coordination: Secondary | ICD-10-CM | POA: Diagnosis not present

## 2023-06-13 DIAGNOSIS — R41841 Cognitive communication deficit: Secondary | ICD-10-CM | POA: Diagnosis not present

## 2023-06-13 DIAGNOSIS — M6281 Muscle weakness (generalized): Secondary | ICD-10-CM | POA: Diagnosis not present

## 2023-06-13 DIAGNOSIS — R278 Other lack of coordination: Secondary | ICD-10-CM | POA: Diagnosis not present

## 2023-06-14 DIAGNOSIS — R41841 Cognitive communication deficit: Secondary | ICD-10-CM | POA: Diagnosis not present

## 2023-06-14 DIAGNOSIS — R278 Other lack of coordination: Secondary | ICD-10-CM | POA: Diagnosis not present

## 2023-06-14 DIAGNOSIS — M6281 Muscle weakness (generalized): Secondary | ICD-10-CM | POA: Diagnosis not present

## 2023-06-15 DIAGNOSIS — R278 Other lack of coordination: Secondary | ICD-10-CM | POA: Diagnosis not present

## 2023-06-15 DIAGNOSIS — M6281 Muscle weakness (generalized): Secondary | ICD-10-CM | POA: Diagnosis not present

## 2023-06-15 DIAGNOSIS — R41841 Cognitive communication deficit: Secondary | ICD-10-CM | POA: Diagnosis not present

## 2023-06-16 DIAGNOSIS — I4891 Unspecified atrial fibrillation: Secondary | ICD-10-CM | POA: Diagnosis not present

## 2023-06-16 DIAGNOSIS — E119 Type 2 diabetes mellitus without complications: Secondary | ICD-10-CM | POA: Diagnosis not present

## 2023-06-16 DIAGNOSIS — R41841 Cognitive communication deficit: Secondary | ICD-10-CM | POA: Diagnosis not present

## 2023-06-16 DIAGNOSIS — R278 Other lack of coordination: Secondary | ICD-10-CM | POA: Diagnosis not present

## 2023-06-16 DIAGNOSIS — M6281 Muscle weakness (generalized): Secondary | ICD-10-CM | POA: Diagnosis not present

## 2023-06-16 DIAGNOSIS — J449 Chronic obstructive pulmonary disease, unspecified: Secondary | ICD-10-CM | POA: Diagnosis not present

## 2023-06-16 DIAGNOSIS — E559 Vitamin D deficiency, unspecified: Secondary | ICD-10-CM | POA: Diagnosis not present

## 2023-06-16 DIAGNOSIS — I10 Essential (primary) hypertension: Secondary | ICD-10-CM | POA: Diagnosis not present

## 2023-06-16 DIAGNOSIS — B372 Candidiasis of skin and nail: Secondary | ICD-10-CM | POA: Diagnosis not present

## 2023-06-17 DIAGNOSIS — M6281 Muscle weakness (generalized): Secondary | ICD-10-CM | POA: Diagnosis not present

## 2023-06-17 DIAGNOSIS — R41841 Cognitive communication deficit: Secondary | ICD-10-CM | POA: Diagnosis not present

## 2023-06-17 DIAGNOSIS — R278 Other lack of coordination: Secondary | ICD-10-CM | POA: Diagnosis not present

## 2023-06-18 DIAGNOSIS — R41841 Cognitive communication deficit: Secondary | ICD-10-CM | POA: Diagnosis not present

## 2023-06-18 DIAGNOSIS — M6281 Muscle weakness (generalized): Secondary | ICD-10-CM | POA: Diagnosis not present

## 2023-06-18 DIAGNOSIS — R278 Other lack of coordination: Secondary | ICD-10-CM | POA: Diagnosis not present

## 2023-06-19 DIAGNOSIS — M6281 Muscle weakness (generalized): Secondary | ICD-10-CM | POA: Diagnosis not present

## 2023-06-19 DIAGNOSIS — R278 Other lack of coordination: Secondary | ICD-10-CM | POA: Diagnosis not present

## 2023-06-19 DIAGNOSIS — R41841 Cognitive communication deficit: Secondary | ICD-10-CM | POA: Diagnosis not present

## 2023-06-20 DIAGNOSIS — R41841 Cognitive communication deficit: Secondary | ICD-10-CM | POA: Diagnosis not present

## 2023-06-20 DIAGNOSIS — M6281 Muscle weakness (generalized): Secondary | ICD-10-CM | POA: Diagnosis not present

## 2023-06-20 DIAGNOSIS — R278 Other lack of coordination: Secondary | ICD-10-CM | POA: Diagnosis not present

## 2023-06-21 DIAGNOSIS — R278 Other lack of coordination: Secondary | ICD-10-CM | POA: Diagnosis not present

## 2023-06-21 DIAGNOSIS — R41841 Cognitive communication deficit: Secondary | ICD-10-CM | POA: Diagnosis not present

## 2023-06-21 DIAGNOSIS — M6281 Muscle weakness (generalized): Secondary | ICD-10-CM | POA: Diagnosis not present

## 2023-06-22 DIAGNOSIS — R41841 Cognitive communication deficit: Secondary | ICD-10-CM | POA: Diagnosis not present

## 2023-06-22 DIAGNOSIS — M6281 Muscle weakness (generalized): Secondary | ICD-10-CM | POA: Diagnosis not present

## 2023-06-22 DIAGNOSIS — R278 Other lack of coordination: Secondary | ICD-10-CM | POA: Diagnosis not present

## 2023-06-23 DIAGNOSIS — R278 Other lack of coordination: Secondary | ICD-10-CM | POA: Diagnosis not present

## 2023-06-23 DIAGNOSIS — M6281 Muscle weakness (generalized): Secondary | ICD-10-CM | POA: Diagnosis not present

## 2023-06-23 DIAGNOSIS — R41841 Cognitive communication deficit: Secondary | ICD-10-CM | POA: Diagnosis not present

## 2023-06-24 DIAGNOSIS — R278 Other lack of coordination: Secondary | ICD-10-CM | POA: Diagnosis not present

## 2023-06-24 DIAGNOSIS — M6281 Muscle weakness (generalized): Secondary | ICD-10-CM | POA: Diagnosis not present

## 2023-06-24 DIAGNOSIS — R41841 Cognitive communication deficit: Secondary | ICD-10-CM | POA: Diagnosis not present

## 2023-06-28 ENCOUNTER — Ambulatory Visit: Payer: Medicare Other | Admitting: Internal Medicine

## 2023-06-28 DIAGNOSIS — M6281 Muscle weakness (generalized): Secondary | ICD-10-CM | POA: Diagnosis not present

## 2023-06-28 DIAGNOSIS — R278 Other lack of coordination: Secondary | ICD-10-CM | POA: Diagnosis not present

## 2023-06-28 DIAGNOSIS — R41841 Cognitive communication deficit: Secondary | ICD-10-CM | POA: Diagnosis not present

## 2023-06-29 DIAGNOSIS — M6281 Muscle weakness (generalized): Secondary | ICD-10-CM | POA: Diagnosis not present

## 2023-06-29 DIAGNOSIS — R41841 Cognitive communication deficit: Secondary | ICD-10-CM | POA: Diagnosis not present

## 2023-06-29 DIAGNOSIS — R278 Other lack of coordination: Secondary | ICD-10-CM | POA: Diagnosis not present

## 2023-06-30 DIAGNOSIS — R41841 Cognitive communication deficit: Secondary | ICD-10-CM | POA: Diagnosis not present

## 2023-06-30 DIAGNOSIS — M6281 Muscle weakness (generalized): Secondary | ICD-10-CM | POA: Diagnosis not present

## 2023-06-30 DIAGNOSIS — R278 Other lack of coordination: Secondary | ICD-10-CM | POA: Diagnosis not present

## 2023-07-01 DIAGNOSIS — R41841 Cognitive communication deficit: Secondary | ICD-10-CM | POA: Diagnosis not present

## 2023-07-01 DIAGNOSIS — R278 Other lack of coordination: Secondary | ICD-10-CM | POA: Diagnosis not present

## 2023-07-01 DIAGNOSIS — M6281 Muscle weakness (generalized): Secondary | ICD-10-CM | POA: Diagnosis not present

## 2023-07-02 DIAGNOSIS — R41841 Cognitive communication deficit: Secondary | ICD-10-CM | POA: Diagnosis not present

## 2023-07-02 DIAGNOSIS — R278 Other lack of coordination: Secondary | ICD-10-CM | POA: Diagnosis not present

## 2023-07-02 DIAGNOSIS — M6281 Muscle weakness (generalized): Secondary | ICD-10-CM | POA: Diagnosis not present

## 2023-07-04 DIAGNOSIS — R278 Other lack of coordination: Secondary | ICD-10-CM | POA: Diagnosis not present

## 2023-07-04 DIAGNOSIS — M6281 Muscle weakness (generalized): Secondary | ICD-10-CM | POA: Diagnosis not present

## 2023-07-04 DIAGNOSIS — R41841 Cognitive communication deficit: Secondary | ICD-10-CM | POA: Diagnosis not present

## 2023-07-06 DIAGNOSIS — R41841 Cognitive communication deficit: Secondary | ICD-10-CM | POA: Diagnosis not present

## 2023-07-06 DIAGNOSIS — R278 Other lack of coordination: Secondary | ICD-10-CM | POA: Diagnosis not present

## 2023-07-06 DIAGNOSIS — M6281 Muscle weakness (generalized): Secondary | ICD-10-CM | POA: Diagnosis not present

## 2023-07-08 DIAGNOSIS — R41841 Cognitive communication deficit: Secondary | ICD-10-CM | POA: Diagnosis not present

## 2023-07-08 DIAGNOSIS — R278 Other lack of coordination: Secondary | ICD-10-CM | POA: Diagnosis not present

## 2023-07-08 DIAGNOSIS — M6281 Muscle weakness (generalized): Secondary | ICD-10-CM | POA: Diagnosis not present

## 2023-07-09 DIAGNOSIS — R278 Other lack of coordination: Secondary | ICD-10-CM | POA: Diagnosis not present

## 2023-07-09 DIAGNOSIS — R41841 Cognitive communication deficit: Secondary | ICD-10-CM | POA: Diagnosis not present

## 2023-07-09 DIAGNOSIS — M6281 Muscle weakness (generalized): Secondary | ICD-10-CM | POA: Diagnosis not present

## 2023-07-12 DIAGNOSIS — E119 Type 2 diabetes mellitus without complications: Secondary | ICD-10-CM | POA: Diagnosis not present

## 2023-07-12 DIAGNOSIS — M6281 Muscle weakness (generalized): Secondary | ICD-10-CM | POA: Diagnosis not present

## 2023-07-12 DIAGNOSIS — I4891 Unspecified atrial fibrillation: Secondary | ICD-10-CM | POA: Diagnosis not present

## 2023-07-12 DIAGNOSIS — B372 Candidiasis of skin and nail: Secondary | ICD-10-CM | POA: Diagnosis not present

## 2023-07-12 DIAGNOSIS — I10 Essential (primary) hypertension: Secondary | ICD-10-CM | POA: Diagnosis not present

## 2023-07-12 DIAGNOSIS — E559 Vitamin D deficiency, unspecified: Secondary | ICD-10-CM | POA: Diagnosis not present

## 2023-07-12 DIAGNOSIS — J449 Chronic obstructive pulmonary disease, unspecified: Secondary | ICD-10-CM | POA: Diagnosis not present

## 2023-07-14 DIAGNOSIS — R278 Other lack of coordination: Secondary | ICD-10-CM | POA: Diagnosis not present

## 2023-07-14 DIAGNOSIS — M6281 Muscle weakness (generalized): Secondary | ICD-10-CM | POA: Diagnosis not present

## 2023-07-14 DIAGNOSIS — R41841 Cognitive communication deficit: Secondary | ICD-10-CM | POA: Diagnosis not present

## 2023-07-25 DIAGNOSIS — R4182 Altered mental status, unspecified: Secondary | ICD-10-CM | POA: Diagnosis not present

## 2023-07-25 DIAGNOSIS — W19XXXA Unspecified fall, initial encounter: Secondary | ICD-10-CM | POA: Diagnosis not present

## 2023-07-26 DIAGNOSIS — I10 Essential (primary) hypertension: Secondary | ICD-10-CM | POA: Diagnosis not present

## 2023-07-26 DIAGNOSIS — M545 Low back pain, unspecified: Secondary | ICD-10-CM | POA: Diagnosis not present

## 2023-07-26 DIAGNOSIS — E119 Type 2 diabetes mellitus without complications: Secondary | ICD-10-CM | POA: Diagnosis not present

## 2023-07-26 DIAGNOSIS — I4891 Unspecified atrial fibrillation: Secondary | ICD-10-CM | POA: Diagnosis not present

## 2023-07-26 DIAGNOSIS — M6281 Muscle weakness (generalized): Secondary | ICD-10-CM | POA: Diagnosis not present

## 2023-07-26 DIAGNOSIS — E039 Hypothyroidism, unspecified: Secondary | ICD-10-CM | POA: Diagnosis not present

## 2023-07-26 DIAGNOSIS — J449 Chronic obstructive pulmonary disease, unspecified: Secondary | ICD-10-CM | POA: Diagnosis not present

## 2023-07-26 DIAGNOSIS — N390 Urinary tract infection, site not specified: Secondary | ICD-10-CM | POA: Diagnosis not present

## 2023-07-26 DIAGNOSIS — M542 Cervicalgia: Secondary | ICD-10-CM | POA: Diagnosis not present

## 2023-07-26 DIAGNOSIS — M6259 Muscle wasting and atrophy, not elsewhere classified, multiple sites: Secondary | ICD-10-CM | POA: Diagnosis not present

## 2023-07-26 DIAGNOSIS — E559 Vitamin D deficiency, unspecified: Secondary | ICD-10-CM | POA: Diagnosis not present

## 2023-07-28 DIAGNOSIS — J449 Chronic obstructive pulmonary disease, unspecified: Secondary | ICD-10-CM | POA: Diagnosis not present

## 2023-07-28 DIAGNOSIS — F600 Paranoid personality disorder: Secondary | ICD-10-CM | POA: Diagnosis not present

## 2023-08-09 DIAGNOSIS — J449 Chronic obstructive pulmonary disease, unspecified: Secondary | ICD-10-CM | POA: Diagnosis not present

## 2023-08-09 DIAGNOSIS — I4891 Unspecified atrial fibrillation: Secondary | ICD-10-CM | POA: Diagnosis not present

## 2023-08-09 DIAGNOSIS — E119 Type 2 diabetes mellitus without complications: Secondary | ICD-10-CM | POA: Diagnosis not present

## 2023-08-09 DIAGNOSIS — I10 Essential (primary) hypertension: Secondary | ICD-10-CM | POA: Diagnosis not present

## 2023-08-09 DIAGNOSIS — K5790 Diverticulosis of intestine, part unspecified, without perforation or abscess without bleeding: Secondary | ICD-10-CM | POA: Diagnosis not present

## 2023-08-09 DIAGNOSIS — Z7901 Long term (current) use of anticoagulants: Secondary | ICD-10-CM | POA: Diagnosis not present

## 2023-08-09 DIAGNOSIS — F600 Paranoid personality disorder: Secondary | ICD-10-CM | POA: Diagnosis not present

## 2023-08-09 DIAGNOSIS — Z79899 Other long term (current) drug therapy: Secondary | ICD-10-CM | POA: Diagnosis not present

## 2023-08-09 DIAGNOSIS — E559 Vitamin D deficiency, unspecified: Secondary | ICD-10-CM | POA: Diagnosis not present

## 2023-08-09 DIAGNOSIS — K219 Gastro-esophageal reflux disease without esophagitis: Secondary | ICD-10-CM | POA: Diagnosis not present

## 2023-08-18 DIAGNOSIS — R4589 Other symptoms and signs involving emotional state: Secondary | ICD-10-CM | POA: Diagnosis not present

## 2023-08-18 DIAGNOSIS — F4324 Adjustment disorder with disturbance of conduct: Secondary | ICD-10-CM | POA: Diagnosis not present

## 2023-08-18 DIAGNOSIS — F411 Generalized anxiety disorder: Secondary | ICD-10-CM | POA: Diagnosis not present

## 2023-08-18 DIAGNOSIS — R41841 Cognitive communication deficit: Secondary | ICD-10-CM | POA: Diagnosis not present

## 2023-08-27 DIAGNOSIS — F99 Mental disorder, not otherwise specified: Secondary | ICD-10-CM | POA: Diagnosis not present

## 2023-08-27 DIAGNOSIS — F600 Paranoid personality disorder: Secondary | ICD-10-CM | POA: Diagnosis not present

## 2023-08-31 DIAGNOSIS — R4589 Other symptoms and signs involving emotional state: Secondary | ICD-10-CM | POA: Diagnosis not present

## 2023-08-31 DIAGNOSIS — F4324 Adjustment disorder with disturbance of conduct: Secondary | ICD-10-CM | POA: Diagnosis not present

## 2023-08-31 DIAGNOSIS — F411 Generalized anxiety disorder: Secondary | ICD-10-CM | POA: Diagnosis not present

## 2023-08-31 DIAGNOSIS — R41841 Cognitive communication deficit: Secondary | ICD-10-CM | POA: Diagnosis not present

## 2023-09-07 DIAGNOSIS — I4891 Unspecified atrial fibrillation: Secondary | ICD-10-CM | POA: Diagnosis not present

## 2023-09-07 DIAGNOSIS — F99 Mental disorder, not otherwise specified: Secondary | ICD-10-CM | POA: Diagnosis not present

## 2023-09-07 DIAGNOSIS — K5730 Diverticulosis of large intestine without perforation or abscess without bleeding: Secondary | ICD-10-CM | POA: Diagnosis not present

## 2023-09-07 DIAGNOSIS — I10 Essential (primary) hypertension: Secondary | ICD-10-CM | POA: Diagnosis not present

## 2023-09-07 DIAGNOSIS — J449 Chronic obstructive pulmonary disease, unspecified: Secondary | ICD-10-CM | POA: Diagnosis not present

## 2023-09-07 DIAGNOSIS — E119 Type 2 diabetes mellitus without complications: Secondary | ICD-10-CM | POA: Diagnosis not present

## 2023-09-08 DIAGNOSIS — R41841 Cognitive communication deficit: Secondary | ICD-10-CM | POA: Diagnosis not present

## 2023-09-08 DIAGNOSIS — M6259 Muscle wasting and atrophy, not elsewhere classified, multiple sites: Secondary | ICD-10-CM | POA: Diagnosis not present

## 2023-09-08 DIAGNOSIS — R1312 Dysphagia, oropharyngeal phase: Secondary | ICD-10-CM | POA: Diagnosis not present

## 2023-09-08 DIAGNOSIS — R278 Other lack of coordination: Secondary | ICD-10-CM | POA: Diagnosis not present

## 2023-09-08 DIAGNOSIS — R2689 Other abnormalities of gait and mobility: Secondary | ICD-10-CM | POA: Diagnosis not present

## 2023-09-09 DIAGNOSIS — R278 Other lack of coordination: Secondary | ICD-10-CM | POA: Diagnosis not present

## 2023-09-09 DIAGNOSIS — R1312 Dysphagia, oropharyngeal phase: Secondary | ICD-10-CM | POA: Diagnosis not present

## 2023-09-09 DIAGNOSIS — R41841 Cognitive communication deficit: Secondary | ICD-10-CM | POA: Diagnosis not present

## 2023-09-09 DIAGNOSIS — R2689 Other abnormalities of gait and mobility: Secondary | ICD-10-CM | POA: Diagnosis not present

## 2023-09-09 DIAGNOSIS — M6259 Muscle wasting and atrophy, not elsewhere classified, multiple sites: Secondary | ICD-10-CM | POA: Diagnosis not present

## 2023-09-12 DIAGNOSIS — R2689 Other abnormalities of gait and mobility: Secondary | ICD-10-CM | POA: Diagnosis not present

## 2023-09-12 DIAGNOSIS — M6259 Muscle wasting and atrophy, not elsewhere classified, multiple sites: Secondary | ICD-10-CM | POA: Diagnosis not present

## 2023-09-12 DIAGNOSIS — R278 Other lack of coordination: Secondary | ICD-10-CM | POA: Diagnosis not present

## 2023-09-12 DIAGNOSIS — R1312 Dysphagia, oropharyngeal phase: Secondary | ICD-10-CM | POA: Diagnosis not present

## 2023-09-12 DIAGNOSIS — R41841 Cognitive communication deficit: Secondary | ICD-10-CM | POA: Diagnosis not present

## 2023-09-13 DIAGNOSIS — R1312 Dysphagia, oropharyngeal phase: Secondary | ICD-10-CM | POA: Diagnosis not present

## 2023-09-13 DIAGNOSIS — R41841 Cognitive communication deficit: Secondary | ICD-10-CM | POA: Diagnosis not present

## 2023-09-13 DIAGNOSIS — R278 Other lack of coordination: Secondary | ICD-10-CM | POA: Diagnosis not present

## 2023-09-13 DIAGNOSIS — R2689 Other abnormalities of gait and mobility: Secondary | ICD-10-CM | POA: Diagnosis not present

## 2023-09-13 DIAGNOSIS — M6259 Muscle wasting and atrophy, not elsewhere classified, multiple sites: Secondary | ICD-10-CM | POA: Diagnosis not present

## 2023-09-14 DIAGNOSIS — R41841 Cognitive communication deficit: Secondary | ICD-10-CM | POA: Diagnosis not present

## 2023-09-14 DIAGNOSIS — R2689 Other abnormalities of gait and mobility: Secondary | ICD-10-CM | POA: Diagnosis not present

## 2023-09-14 DIAGNOSIS — R1312 Dysphagia, oropharyngeal phase: Secondary | ICD-10-CM | POA: Diagnosis not present

## 2023-09-14 DIAGNOSIS — R278 Other lack of coordination: Secondary | ICD-10-CM | POA: Diagnosis not present

## 2023-09-14 DIAGNOSIS — M6259 Muscle wasting and atrophy, not elsewhere classified, multiple sites: Secondary | ICD-10-CM | POA: Diagnosis not present

## 2023-09-15 DIAGNOSIS — R41841 Cognitive communication deficit: Secondary | ICD-10-CM | POA: Diagnosis not present

## 2023-09-15 DIAGNOSIS — R1312 Dysphagia, oropharyngeal phase: Secondary | ICD-10-CM | POA: Diagnosis not present

## 2023-09-15 DIAGNOSIS — R2689 Other abnormalities of gait and mobility: Secondary | ICD-10-CM | POA: Diagnosis not present

## 2023-09-15 DIAGNOSIS — R278 Other lack of coordination: Secondary | ICD-10-CM | POA: Diagnosis not present

## 2023-09-15 DIAGNOSIS — M6259 Muscle wasting and atrophy, not elsewhere classified, multiple sites: Secondary | ICD-10-CM | POA: Diagnosis not present

## 2023-09-16 DIAGNOSIS — J449 Chronic obstructive pulmonary disease, unspecified: Secondary | ICD-10-CM | POA: Diagnosis not present

## 2023-09-16 DIAGNOSIS — M6259 Muscle wasting and atrophy, not elsewhere classified, multiple sites: Secondary | ICD-10-CM | POA: Diagnosis not present

## 2023-09-16 DIAGNOSIS — F99 Mental disorder, not otherwise specified: Secondary | ICD-10-CM | POA: Diagnosis not present

## 2023-09-16 DIAGNOSIS — R2689 Other abnormalities of gait and mobility: Secondary | ICD-10-CM | POA: Diagnosis not present

## 2023-09-16 DIAGNOSIS — R278 Other lack of coordination: Secondary | ICD-10-CM | POA: Diagnosis not present

## 2023-09-16 DIAGNOSIS — I4891 Unspecified atrial fibrillation: Secondary | ICD-10-CM | POA: Diagnosis not present

## 2023-09-16 DIAGNOSIS — R41841 Cognitive communication deficit: Secondary | ICD-10-CM | POA: Diagnosis not present

## 2023-09-16 DIAGNOSIS — R1312 Dysphagia, oropharyngeal phase: Secondary | ICD-10-CM | POA: Diagnosis not present

## 2023-09-16 DIAGNOSIS — E119 Type 2 diabetes mellitus without complications: Secondary | ICD-10-CM | POA: Diagnosis not present

## 2023-09-16 DIAGNOSIS — I10 Essential (primary) hypertension: Secondary | ICD-10-CM | POA: Diagnosis not present

## 2023-09-16 DIAGNOSIS — K5730 Diverticulosis of large intestine without perforation or abscess without bleeding: Secondary | ICD-10-CM | POA: Diagnosis not present

## 2023-09-19 DIAGNOSIS — R1312 Dysphagia, oropharyngeal phase: Secondary | ICD-10-CM | POA: Diagnosis not present

## 2023-09-19 DIAGNOSIS — R41841 Cognitive communication deficit: Secondary | ICD-10-CM | POA: Diagnosis not present

## 2023-09-19 DIAGNOSIS — R2689 Other abnormalities of gait and mobility: Secondary | ICD-10-CM | POA: Diagnosis not present

## 2023-09-19 DIAGNOSIS — M6259 Muscle wasting and atrophy, not elsewhere classified, multiple sites: Secondary | ICD-10-CM | POA: Diagnosis not present

## 2023-09-19 DIAGNOSIS — R278 Other lack of coordination: Secondary | ICD-10-CM | POA: Diagnosis not present

## 2023-09-20 DIAGNOSIS — R1312 Dysphagia, oropharyngeal phase: Secondary | ICD-10-CM | POA: Diagnosis not present

## 2023-09-20 DIAGNOSIS — M6259 Muscle wasting and atrophy, not elsewhere classified, multiple sites: Secondary | ICD-10-CM | POA: Diagnosis not present

## 2023-09-20 DIAGNOSIS — R2689 Other abnormalities of gait and mobility: Secondary | ICD-10-CM | POA: Diagnosis not present

## 2023-09-20 DIAGNOSIS — R41841 Cognitive communication deficit: Secondary | ICD-10-CM | POA: Diagnosis not present

## 2023-09-20 DIAGNOSIS — R278 Other lack of coordination: Secondary | ICD-10-CM | POA: Diagnosis not present

## 2023-09-21 ENCOUNTER — Other Ambulatory Visit: Payer: Self-pay

## 2023-09-21 ENCOUNTER — Emergency Department (HOSPITAL_COMMUNITY)
Admission: EM | Admit: 2023-09-21 | Discharge: 2023-09-21 | Disposition: A | Discharge status: Home / Self Care | Attending: Emergency Medicine | Admitting: Emergency Medicine

## 2023-09-21 ENCOUNTER — Emergency Department (HOSPITAL_COMMUNITY)

## 2023-09-21 DIAGNOSIS — R0602 Shortness of breath: Secondary | ICD-10-CM | POA: Diagnosis not present

## 2023-09-21 DIAGNOSIS — J42 Unspecified chronic bronchitis: Secondary | ICD-10-CM | POA: Insufficient documentation

## 2023-09-21 DIAGNOSIS — R41841 Cognitive communication deficit: Secondary | ICD-10-CM | POA: Diagnosis not present

## 2023-09-21 DIAGNOSIS — N390 Urinary tract infection, site not specified: Secondary | ICD-10-CM | POA: Diagnosis not present

## 2023-09-21 DIAGNOSIS — R1312 Dysphagia, oropharyngeal phase: Secondary | ICD-10-CM | POA: Diagnosis not present

## 2023-09-21 DIAGNOSIS — Z79899 Other long term (current) drug therapy: Secondary | ICD-10-CM | POA: Insufficient documentation

## 2023-09-21 DIAGNOSIS — I10 Essential (primary) hypertension: Secondary | ICD-10-CM | POA: Diagnosis not present

## 2023-09-21 DIAGNOSIS — E871 Hypo-osmolality and hyponatremia: Secondary | ICD-10-CM | POA: Diagnosis not present

## 2023-09-21 DIAGNOSIS — L299 Pruritus, unspecified: Secondary | ICD-10-CM | POA: Diagnosis not present

## 2023-09-21 DIAGNOSIS — Z7401 Bed confinement status: Secondary | ICD-10-CM | POA: Diagnosis not present

## 2023-09-21 DIAGNOSIS — M6259 Muscle wasting and atrophy, not elsewhere classified, multiple sites: Secondary | ICD-10-CM | POA: Diagnosis not present

## 2023-09-21 DIAGNOSIS — R21 Rash and other nonspecific skin eruption: Secondary | ICD-10-CM | POA: Diagnosis not present

## 2023-09-21 DIAGNOSIS — B356 Tinea cruris: Secondary | ICD-10-CM | POA: Diagnosis not present

## 2023-09-21 DIAGNOSIS — J9811 Atelectasis: Secondary | ICD-10-CM | POA: Diagnosis not present

## 2023-09-21 DIAGNOSIS — R2689 Other abnormalities of gait and mobility: Secondary | ICD-10-CM | POA: Diagnosis not present

## 2023-09-21 DIAGNOSIS — Z7901 Long term (current) use of anticoagulants: Secondary | ICD-10-CM | POA: Diagnosis not present

## 2023-09-21 DIAGNOSIS — J40 Bronchitis, not specified as acute or chronic: Secondary | ICD-10-CM | POA: Diagnosis not present

## 2023-09-21 DIAGNOSIS — R300 Dysuria: Secondary | ICD-10-CM | POA: Diagnosis not present

## 2023-09-21 DIAGNOSIS — R319 Hematuria, unspecified: Secondary | ICD-10-CM | POA: Diagnosis not present

## 2023-09-21 DIAGNOSIS — R278 Other lack of coordination: Secondary | ICD-10-CM | POA: Diagnosis not present

## 2023-09-21 LAB — CBC WITH DIFFERENTIAL/PLATELET
Abs Immature Granulocytes: 0.05 10*3/uL (ref 0.00–0.07)
Basophils Absolute: 0 10*3/uL (ref 0.0–0.1)
Basophils Relative: 0 %
Eosinophils Absolute: 0.1 10*3/uL (ref 0.0–0.5)
Eosinophils Relative: 2 %
HCT: 36.1 % — ABNORMAL LOW (ref 39.0–52.0)
Hemoglobin: 12 g/dL — ABNORMAL LOW (ref 13.0–17.0)
Immature Granulocytes: 1 %
Lymphocytes Relative: 9 %
Lymphs Abs: 0.8 10*3/uL (ref 0.7–4.0)
MCH: 29.3 pg (ref 26.0–34.0)
MCHC: 33.2 g/dL (ref 30.0–36.0)
MCV: 88 fL (ref 80.0–100.0)
Monocytes Absolute: 0.6 10*3/uL (ref 0.1–1.0)
Monocytes Relative: 7 %
Neutro Abs: 7.4 10*3/uL (ref 1.7–7.7)
Neutrophils Relative %: 81 %
Platelets: 329 10*3/uL (ref 150–400)
RBC: 4.1 MIL/uL — ABNORMAL LOW (ref 4.22–5.81)
RDW: 15.7 % — ABNORMAL HIGH (ref 11.5–15.5)
WBC: 9 10*3/uL (ref 4.0–10.5)
nRBC: 0 % (ref 0.0–0.2)

## 2023-09-21 LAB — BASIC METABOLIC PANEL WITH GFR
Anion gap: 9 (ref 5–15)
BUN: 22 mg/dL (ref 8–23)
CO2: 28 mmol/L (ref 22–32)
Calcium: 9.3 mg/dL (ref 8.9–10.3)
Chloride: 91 mmol/L — ABNORMAL LOW (ref 98–111)
Creatinine, Ser: 0.9 mg/dL (ref 0.61–1.24)
GFR, Estimated: >60 mL/min (ref >60)
Glucose, Bld: 159 mg/dL — ABNORMAL HIGH (ref 70–99)
Potassium: 4.1 mmol/L (ref 3.5–5.1)
Sodium: 128 mmol/L — ABNORMAL LOW (ref 135–145)

## 2023-09-21 LAB — URINALYSIS, W/ REFLEX TO CULTURE (INFECTION SUSPECTED)
Bilirubin Urine: NEGATIVE
Glucose, UA: NEGATIVE mg/dL
Hgb urine dipstick: NEGATIVE
Ketones, ur: NEGATIVE mg/dL
Nitrite: NEGATIVE
Protein, ur: NEGATIVE mg/dL
Specific Gravity, Urine: 1.009 (ref 1.005–1.030)
pH: 7 (ref 5.0–8.0)

## 2023-09-21 LAB — TROPONIN I (HIGH SENSITIVITY): Troponin I (High Sensitivity): 10 ng/L (ref <18)

## 2023-09-21 MED ORDER — IPRATROPIUM-ALBUTEROL 0.5-2.5 (3) MG/3ML IN SOLN
3.0000 mL | Freq: Once | RESPIRATORY_TRACT | Status: AC
  Administered 2023-09-21: 3 mL via RESPIRATORY_TRACT
  Filled 2023-09-21: qty 3

## 2023-09-21 MED ORDER — SODIUM CHLORIDE 0.9 % IV BOLUS
500.0000 mL | Freq: Once | INTRAVENOUS | Status: AC
  Administered 2023-09-21: 500 mL via INTRAVENOUS

## 2023-09-21 MED ORDER — NITROFURANTOIN MONOHYD MACRO 100 MG PO CAPS: 100.0000 mg | ORAL_CAPSULE | Freq: Two times a day (BID) | ORAL | 0 refills | Status: AC

## 2023-09-21 NOTE — ED Notes (Signed)
 Attempted to call report to [facility], no answer x 2

## 2023-09-21 NOTE — ED Provider Notes (Signed)
 [place] EMERGENCY DEPARTMENT AT [facility] Provider Note   CSN: [id number] Arrival date & time: 09/21/23  1358     Patient presents with: Diaper Rash and Shortness of Breath (Pt arrives today with intermittent sob while he eats. Also reports severe redness/scaling rash in groin and inbetween legs and buttocks. Being tx currently for rash under breasts. Hx copd )   Steven Simmons is a 83 y.o. male.   Patient is a 83 year old male with a past medical history of COPD on home O2 as needed, hypertension, A-fib presenting to the emergency department with rash and shortness of breath.  The patient states that he has had a rash on his chest for the last few weeks that has been treated with a topical antifungal.  He states that over the last week he started to develop a rash in his groin.  He states that it is itchy not painful.  He states that he is able to control his bowels and bladder and does not normally wear any depends.  He states that he has had some dysuria, denies any hematuria.  Patient also reports some shortness of breath.  He states that this has been on and off for several weeks.  He states that he gets short of breath when drinking water after going to the bathroom and it will quickly resolve.  States that he has had a mild cough productive of mucus.  Denies any chest pain, fever or lower extremity swelling.  The history is provided by the patient.  Diaper Rash Associated symptoms include shortness of breath.  Shortness of Breath      Prior to Admission medications   Medication Sig Start Date End Date Taking? Authorizing Provider  acetaminophen  (TYLENOL ) 325 MG tablet Take 2 tablets (650 mg total) by mouth every 6 (six) hours as needed for mild pain (or Fever >/= 101). Patient taking differently: Take 650 mg by mouth in the morning and at bedtime. 06/14/22  Yes Sheikh, Omair Latif, DO  acetaminophen  (TYLENOL ) 325 MG tablet Take 650 mg by mouth every 6 (six) hours as  needed for mild pain (pain score 1-3), moderate pain (pain score 4-6) or fever.   Yes [provider]  albuterol  (VENTOLIN  HFA) 108 (90 Base) MCG/ACT inhaler Inhale 2 puffs into the lungs every 4 (four) hours as needed for wheezing or shortness of breath (or coughing). 12/25/22  Yes Alissa April, MD  calcium  carbonate (TUMS - DOSED IN MG ELEMENTAL CALCIUM ) 500 MG chewable tablet Chew 1,000 mg by mouth 3 (three) times daily as needed for indigestion.   Yes [provider]  Cholecalciferol (VITAMIN D) 50 MCG (2000 UT) CAPS Take 2,000 Units by mouth daily.   Yes [provider]  cloNIDine  (CATAPRES ) 0.1 MG tablet Take 0.1 mg by mouth daily as needed (SBP >180).   Yes [provider]  diltiazem  (CARDIZEM  CD) 120 MG 24 hr capsule Take 1 capsule (120 mg total) by mouth daily. 03/03/22 09/21/23 Yes Rosealee Concha, MD  famotidine  (PEPCID ) 20 MG tablet Take 1 tablet (20 mg total) by mouth at bedtime. 01/13/23  Yes Arlee Bellows, NP  fluconazole  (DIFLUCAN ) 200 MG tablet Take 200 mg by mouth once.   Yes [provider]  hydrochlorothiazide  (MICROZIDE ) 12.5 MG capsule Take 12.5 mg by mouth daily.   Yes [provider]  ipratropium-albuterol  (DUONEB) 0.5-2.5 (3) MG/3ML SOLN Take 3 mLs by nebulization 2 (two) times daily as needed (for shortness of breath, coughing, or wheezing).  Yes [provider]  labetalol  (NORMODYNE ) 100 MG tablet Take 1 tablet (100 mg total) by mouth 2 (two) times daily. 01/17/23  Yes Alissa April, MD  loperamide (IMODIUM A-D) 2 MG tablet Take 2-4 mg by mouth See admin instructions. Take 4 mg by mouth at onset of diarrhea, then 2 mg after each subsequent stool- not to exceed 16 grams/24 hours; call MD if no relief   Yes [provider]  metFORMIN (GLUCOPHAGE) 500 MG tablet Take 500 mg by mouth 2 (two) times daily with a meal.   Yes [provider]  pantoprazole  (PROTONIX ) 40 MG tablet Take 1 tablet (40 mg  total) by mouth daily. 05/20/22 09/21/23 Yes Sage, Fairy Homer, PA-C  polyethylene glycol powder (GLYCOLAX /MIRALAX ) 17 GM/SCOOP powder Take 17 g by mouth daily as needed for mild constipation or moderate constipation.   Yes [provider]  potassium chloride  (KLOR-CON ) 10 MEQ tablet Take 10 mEq by mouth daily.   Yes [provider]  Skin Protectants, Misc. (EUCERIN) cream Apply 1 Application topically in the morning and at bedtime.   Yes [provider]  sucralfate  (CARAFATE ) 1 g tablet Take 1 tablet (1 g total) by mouth 4 (four) times daily -  with meals and at bedtime. Patient taking differently: Take 1 g by mouth 4 (four) times daily as needed (acid reflux). 06/14/22  Yes Sheikh, Omair Latif, DO  fluconazole  (DIFLUCAN ) 150 MG tablet Take 150 mg by mouth once. Patient not taking: Reported on 09/21/2023    [provider]  OXYGEN Inhale 1-5 L/min into the lungs See admin instructions. 1-5 L/min via nasal cannula and may titrate to keep sats >90% Patient not taking: Reported on 09/21/2023    [provider]  potassium chloride  SA (KLOR-CON  M) 20 MEQ tablet Take 1 tablet twice a day for 10 days, then take 1 tablet once daily Patient not taking: Reported on 09/21/2023 01/17/23   Alissa April, MD    Allergies: Ceclor [cefaclor]; Veralipride; Penicillins; Amlodipine ; Antihistamines, chlorpheniramine-type; Beta adrenergic blockers; Celebrex [celecoxib]; Ibuprofen; Lipitor [atorvastatin ]; and Nutrasweet aspartame [aspartame]    Review of Systems  Respiratory:  Positive for shortness of breath.     Updated Vital Signs BP (!) 189/105   Pulse 68   Temp 97.9 F (36.6 C) (Oral)   Resp 19   SpO2 99%   Physical Exam Vitals and nursing note reviewed.  Constitutional:      General: He is not in acute distress.    Appearance: He is well-developed.  HENT:     Head: Normocephalic.     Mouth/Throat:     Mouth: Mucous membranes are moist.   Eyes:     Extraocular  Movements: Extraocular movements intact.    Cardiovascular:     Rate and Rhythm: Normal rate and regular rhythm.  Pulmonary:     Effort: Pulmonary effort is normal.     Breath sounds: Wheezing (diffuse, trace end expiratory) present.  Abdominal:     Palpations: Abdomen is soft.     Tenderness: There is no abdominal tenderness.   Musculoskeletal:        General: Normal range of motion.     Cervical back: Normal range of motion and neck supple.     Right lower leg: No edema.     Left lower leg: No edema.   Skin:    General: Skin is warm and dry.     Findings: Rash (erythematous rash along buttocks and posterior thigh, no warmth) present.  Neurological:     General: No focal deficit present.     Mental Status: He is alert and oriented to person, place, and time.   Psychiatric:        Mood and Affect: Mood normal.        Behavior: Behavior normal.     (all labs ordered are listed, but only abnormal results are displayed) Labs Reviewed  CBC WITH DIFFERENTIAL/PLATELET - Abnormal; Notable for the following components:      Result Value   RBC 4.10 (*)    Hemoglobin 12.0 (*)    HCT 36.1 (*)    RDW 15.7 (*)    All other components within normal limits  BASIC METABOLIC PANEL WITH GFR - Abnormal; Notable for the following components:   Sodium 128 (*)    Chloride 91 (*)    Glucose, Bld 159 (*)    All other components within normal limits  URINALYSIS, W/ REFLEX TO CULTURE (INFECTION SUSPECTED)  TROPONIN I (HIGH SENSITIVITY)    EKG: EKG Interpretation Date/Time:  Wednesday September 21 2023 15:20:39 EDT Ventricular Rate:  58 PR Interval:    QRS Duration:  101 QT Interval:  465 QTC Calculation: 457 R Axis:   4  Text Interpretation: Atrial fibrillation No significant change since last tracing Confirmed by Celesta Coke ([id number]) on 09/21/2023 3:24:06 PM  Radiology: DG Chest 2 View Result Date: 09/21/2023 CLINICAL DATA:  shortness of breath EXAM: CHEST - 2 VIEW COMPARISON:   Chest x-ray 12/24/2022 FINDINGS: The heart and mediastinal contours are unchanged atherosclerotic plaque. Bibasilar atelectasis. No focal consolidation. No pulmonary edema. No pleural effusion. No pneumothorax. No acute osseous abnormality. IMPRESSION: No active cardiopulmonary disease. Electronically Signed   By: Morgane  Naveau M.D.   On: 09/21/2023 16:08     Procedures   Medications Ordered in the ED  ipratropium-albuterol  (DUONEB) 0.5-2.5 (3) MG/3ML nebulizer solution 3 mL (3 mLs Nebulization Given 09/21/23 1620)  sodium chloride  0.9 % bolus 500 mL (500 mLs Intravenous New Bag/Given 09/21/23 1702)    Clinical Course as of 09/21/23 1708  Wed Sep 21, 2023  1631 Mild hyponatremia, will be given fluid bolus. CXR without acute disease. Troponin negative. Symptoms ongoing for several weeks so single troponin is sufficient. Suspect SOB is related to COPD. UA is pending. [VK]    Clinical Course User Index [VK] Kingsley, Meshulem Onorato K, DO                                 Medical Decision Making This patient presents to the ED with chief complaint(s) of rash, SOB with pertinent past medical history of COPD, A fib, HTN which further complicates the presenting complaint. The complaint involves an extensive differential diagnosis and also carries with it a high risk of complications and morbidity.    The differential diagnosis includes fungal rash, no warmth to touch making cellulitis unlikely, COPD exacerbation, pneumonia, pnemothorax, ACS, arrhythmia, anemia    Additional history obtained: Additional history obtained from N/A Records reviewed Nursing Home Documents  ED Course and Reassessment: On patient's arrival he is hemodynamically stable in no acute distress.  Does have some trace end expiratory wheeze and will be given a DuoNeb.  EKG on arrival showed rate controlled A-fib without acute ischemic changes.  Will of labs including chest x-ray performed.  The patient's rash does appear consistent  with a fungal rash and he is currently being treated with antifungal cream and pills  which seem to be appropriate treatment.  Independent labs interpretation:  The following labs were independently interpreted: mild hyponatremia, otherwise at baseline  Independent visualization of imaging: - I independently visualized the following imaging with scope of interpretation limited to determining acute life threatening conditions related to emergency care: CXR, which revealed no acute disease  Consultation: - Consulted or discussed management/test interpretation w/ external professional: N/A  Consideration for admission or further workup: Patient has no emergent conditions requiring admission or further work-up at this time and is stable for discharge home with primary care follow-up  Social Determinants of health: N/A    Amount and/or Complexity of Data Reviewed Labs: ordered. Radiology: ordered.  Risk Prescription drug management.       Final diagnoses:  Tinea cruris  Chronic bronchitis, unspecified chronic bronchitis type [facility])    ED Discharge Orders     None          Kingsley, Khaliel Morey K, DO 09/21/23 1708

## 2023-09-21 NOTE — ED Notes (Signed)
 Given sandwich and juice per provider ok

## 2023-09-21 NOTE — Discharge Instructions (Addendum)
 You were seen in the emergency department for your shortness of breath and rash. Your rash is consistent with a fungal rash and should continue the antifungal medication and cream as prescribed at your nursing home. Your shortness of breath is likely related to your COPD and can continue to use your home inhalers as prescribed. You should return to the emergency department if you have fever, worsening shortness of breath or any other new or concerning symptoms.

## 2023-09-21 NOTE — ED Provider Notes (Addendum)
 Patient is urinalysis consistent with infection.  Will place on antibiotics and discharged home   Lind Repine, MD 09/21/23 1954    Lind Repine, MD 09/21/23 Gerry Krone

## 2023-09-21 NOTE — ED Notes (Signed)
 Patient is resting comfortably.

## 2023-09-21 NOTE — ED Notes (Signed)
[facility] notified for pt transport back to facility.

## 2023-09-22 DIAGNOSIS — R41841 Cognitive communication deficit: Secondary | ICD-10-CM | POA: Diagnosis not present

## 2023-09-22 DIAGNOSIS — M6259 Muscle wasting and atrophy, not elsewhere classified, multiple sites: Secondary | ICD-10-CM | POA: Diagnosis not present

## 2023-09-22 DIAGNOSIS — R1312 Dysphagia, oropharyngeal phase: Secondary | ICD-10-CM | POA: Diagnosis not present

## 2023-09-22 DIAGNOSIS — R278 Other lack of coordination: Secondary | ICD-10-CM | POA: Diagnosis not present

## 2023-09-22 DIAGNOSIS — R2689 Other abnormalities of gait and mobility: Secondary | ICD-10-CM | POA: Diagnosis not present

## 2023-09-23 DIAGNOSIS — R2689 Other abnormalities of gait and mobility: Secondary | ICD-10-CM | POA: Diagnosis not present

## 2023-09-23 DIAGNOSIS — R278 Other lack of coordination: Secondary | ICD-10-CM | POA: Diagnosis not present

## 2023-09-23 DIAGNOSIS — R1312 Dysphagia, oropharyngeal phase: Secondary | ICD-10-CM | POA: Diagnosis not present

## 2023-09-23 DIAGNOSIS — M6259 Muscle wasting and atrophy, not elsewhere classified, multiple sites: Secondary | ICD-10-CM | POA: Diagnosis not present

## 2023-09-23 DIAGNOSIS — R41841 Cognitive communication deficit: Secondary | ICD-10-CM | POA: Diagnosis not present

## 2023-09-23 LAB — URINE CULTURE: Culture: >=100000 — AB

## 2023-09-24 ENCOUNTER — Telehealth (HOSPITAL_BASED_OUTPATIENT_CLINIC_OR_DEPARTMENT_OTHER): Payer: Self-pay

## 2023-09-24 NOTE — Progress Notes (Addendum)
 ED Antimicrobial Stewardship Positive Culture Follow Up   Steven Simmons is an 83 y.o. male who presented to [facility] on 09/21/2023 with a chief complaint of  Chief Complaint  Patient presents with   Diaper Rash   Shortness of Breath    Pt arrives today with intermittent sob while he eats. Also reports severe redness/scaling rash in groin and inbetween legs and buttocks. Being tx currently for rash under breasts. Hx copd     Recent Results (from the past 720 hours)  Urine Culture     Status: Abnormal   Collection Time: 09/21/23  4:20 PM   Specimen: Urine, Random  Result Value Ref Range Status   Specimen Description   Final    URINE, RANDOM Performed at [facility], [address] [place], [place] [address]    Special Requests   Final    NONE Reflexed from [id number] Performed at [facility], [address], [place], [place] [address]    Culture >=100,000 COLONIES/mL PROVIDENCIA STUARTII (A)  Final   Report Status 09/23/2023 FINAL  Final   Organism ID, Bacteria PROVIDENCIA STUARTII (A)  Final      Susceptibility   Providencia stuartii - MIC*    AMPICILLIN RESISTANT Resistant     CEFEPIME  <=0.12 SENSITIVE Sensitive     CEFTRIAXONE  <=0.25 SENSITIVE Sensitive     CIPROFLOXACIN  >=4 RESISTANT Resistant     GENTAMICIN RESISTANT Resistant     IMIPENEM 1 SENSITIVE Sensitive     NITROFURANTOIN 128 RESISTANT Resistant     TRIMETH/SULFA >=320 RESISTANT Resistant     AMPICILLIN/SULBACTAM <=2 SENSITIVE Sensitive     PIP/TAZO <=4 SENSITIVE Sensitive ug/mL    * >=100,000 COLONIES/mL PROVIDENCIA STUARTII    [x]  Treated with nitrofurantoin, organism resistant to prescribed antimicrobial  Pt here for shortness of breath and concern for fungal infection in groin. He had been treated with topical antifungal for rash on chest. Some mention of dysuria. Patient treated with antifungals and given nitrofurantoin based on UA. Urine culture with Providencia  stuartii resistant to all oral antibiotics except Augmentin. Patient with allergy to penicillins - rash. Discussed with EDP - would recommend giving oral penicillin and having patient and facility staff monitor for reaction. Sounds like allergy from many years ago.  New antibiotic prescription: Augmentin 875 mg BID x 5 days  ADDENDUM 6/21: Facility called concerning PCN allergy, will treat with one dose of fosfomycin 3 g instead.  I attempted to call the facility at the request of Corean, RN at [facility] [phone]). I was hung up on twice after waiting on hold for approximately 2-3 minutes and I also gave my callback number to someone with no return call at the time of this note.  ED Provider: Jayson Pereyra, DO   Stefano MARLA Bologna, PharmD, BCPS Clinical Pharmacist 09/24/2023 8:22 AM

## 2023-09-24 NOTE — Telephone Encounter (Signed)
 Post ED Visit - Positive Culture Follow-up: Successful Patient Follow-Up  Culture assessed and recommendations reviewed by:  [x]  Abby Ellington, Pharm.D. []  Venetia Gully, Pharm.D., BCPS AQ-ID []  Garrel Crews, Pharm.D., BCPS []  Almarie Lunger, Pharm.D., BCPS []  [place], [address].D., BCPS, AAHIVP []  Rosaline Bihari, Pharm.D., BCPS, AAHIVP []  Vernell Meier, PharmD, BCPS []  Latanya Hint, PharmD, BCPS []  Donald Medley, PharmD, BCPS []  Rocky Bold, PharmD  Positive urine culture  []  Patient discharged without antimicrobial prescription and treatment is now indicated [x]  Organism is resistant to prescribed ED discharge antimicrobial []  Patient with positive blood cultures  Changes discussed with ED provider: Jayson Pereyra, DO  New antibiotic prescription Augmentin 875 - 125 mg po 1 tab BID x 5 days.   Pt with PCN allergy- Rash. Will need to confirm with [facility] with pt if OK to give otherwise no PO options. Would recommend giving Augment and have pt and staff monitor for allergic reaction.   Called and Faxed to [facility] (pt caregiver), date 09/24/23, time 12:05 pm   Steven Simmons 09/24/2023, 12:03 PM

## 2023-09-26 DIAGNOSIS — S60222A Contusion of left hand, initial encounter: Secondary | ICD-10-CM | POA: Diagnosis not present

## 2023-09-26 DIAGNOSIS — S60052A Contusion of left little finger without damage to nail, initial encounter: Secondary | ICD-10-CM | POA: Diagnosis not present

## 2023-09-26 DIAGNOSIS — M79642 Pain in left hand: Secondary | ICD-10-CM | POA: Diagnosis not present

## 2023-09-26 DIAGNOSIS — W06XXXA Fall from bed, initial encounter: Secondary | ICD-10-CM | POA: Diagnosis not present

## 2023-09-27 DIAGNOSIS — R1312 Dysphagia, oropharyngeal phase: Secondary | ICD-10-CM | POA: Diagnosis not present

## 2023-09-27 DIAGNOSIS — M79645 Pain in left finger(s): Secondary | ICD-10-CM | POA: Diagnosis not present

## 2023-09-27 DIAGNOSIS — M6259 Muscle wasting and atrophy, not elsewhere classified, multiple sites: Secondary | ICD-10-CM | POA: Diagnosis not present

## 2023-09-27 DIAGNOSIS — R41841 Cognitive communication deficit: Secondary | ICD-10-CM | POA: Diagnosis not present

## 2023-09-27 DIAGNOSIS — R2689 Other abnormalities of gait and mobility: Secondary | ICD-10-CM | POA: Diagnosis not present

## 2023-09-27 DIAGNOSIS — M24445 Recurrent dislocation, left finger: Secondary | ICD-10-CM | POA: Diagnosis not present

## 2023-09-27 DIAGNOSIS — R278 Other lack of coordination: Secondary | ICD-10-CM | POA: Diagnosis not present

## 2023-09-28 DIAGNOSIS — R41841 Cognitive communication deficit: Secondary | ICD-10-CM | POA: Diagnosis not present

## 2023-09-28 DIAGNOSIS — M6259 Muscle wasting and atrophy, not elsewhere classified, multiple sites: Secondary | ICD-10-CM | POA: Diagnosis not present

## 2023-09-28 DIAGNOSIS — R278 Other lack of coordination: Secondary | ICD-10-CM | POA: Diagnosis not present

## 2023-09-28 DIAGNOSIS — R2689 Other abnormalities of gait and mobility: Secondary | ICD-10-CM | POA: Diagnosis not present

## 2023-09-28 DIAGNOSIS — R1312 Dysphagia, oropharyngeal phase: Secondary | ICD-10-CM | POA: Diagnosis not present

## 2023-09-29 DIAGNOSIS — R2689 Other abnormalities of gait and mobility: Secondary | ICD-10-CM | POA: Diagnosis not present

## 2023-09-29 DIAGNOSIS — R41841 Cognitive communication deficit: Secondary | ICD-10-CM | POA: Diagnosis not present

## 2023-09-29 DIAGNOSIS — M6259 Muscle wasting and atrophy, not elsewhere classified, multiple sites: Secondary | ICD-10-CM | POA: Diagnosis not present

## 2023-09-29 DIAGNOSIS — R278 Other lack of coordination: Secondary | ICD-10-CM | POA: Diagnosis not present

## 2023-09-29 DIAGNOSIS — R1312 Dysphagia, oropharyngeal phase: Secondary | ICD-10-CM | POA: Diagnosis not present

## 2023-09-30 DIAGNOSIS — R2689 Other abnormalities of gait and mobility: Secondary | ICD-10-CM | POA: Diagnosis not present

## 2023-09-30 DIAGNOSIS — R41841 Cognitive communication deficit: Secondary | ICD-10-CM | POA: Diagnosis not present

## 2023-09-30 DIAGNOSIS — R1312 Dysphagia, oropharyngeal phase: Secondary | ICD-10-CM | POA: Diagnosis not present

## 2023-09-30 DIAGNOSIS — M6259 Muscle wasting and atrophy, not elsewhere classified, multiple sites: Secondary | ICD-10-CM | POA: Diagnosis not present

## 2023-09-30 DIAGNOSIS — R278 Other lack of coordination: Secondary | ICD-10-CM | POA: Diagnosis not present

## 2023-10-01 DIAGNOSIS — I10 Essential (primary) hypertension: Secondary | ICD-10-CM | POA: Diagnosis not present

## 2023-10-01 DIAGNOSIS — R2689 Other abnormalities of gait and mobility: Secondary | ICD-10-CM | POA: Diagnosis not present

## 2023-10-01 DIAGNOSIS — R278 Other lack of coordination: Secondary | ICD-10-CM | POA: Diagnosis not present

## 2023-10-01 DIAGNOSIS — R41841 Cognitive communication deficit: Secondary | ICD-10-CM | POA: Diagnosis not present

## 2023-10-01 DIAGNOSIS — J449 Chronic obstructive pulmonary disease, unspecified: Secondary | ICD-10-CM | POA: Diagnosis not present

## 2023-10-01 DIAGNOSIS — M6259 Muscle wasting and atrophy, not elsewhere classified, multiple sites: Secondary | ICD-10-CM | POA: Diagnosis not present

## 2023-10-01 DIAGNOSIS — R1312 Dysphagia, oropharyngeal phase: Secondary | ICD-10-CM | POA: Diagnosis not present

## 2023-10-03 DIAGNOSIS — S63287A Dislocation of proximal interphalangeal joint of left little finger, initial encounter: Secondary | ICD-10-CM | POA: Diagnosis not present

## 2023-10-03 DIAGNOSIS — X58XXXA Exposure to other specified factors, initial encounter: Secondary | ICD-10-CM | POA: Diagnosis not present

## 2023-10-03 DIAGNOSIS — Y999 Unspecified external cause status: Secondary | ICD-10-CM | POA: Diagnosis not present

## 2023-10-03 DIAGNOSIS — S62627A Displaced fracture of medial phalanx of left little finger, initial encounter for closed fracture: Secondary | ICD-10-CM | POA: Diagnosis not present

## 2023-10-03 DIAGNOSIS — R41841 Cognitive communication deficit: Secondary | ICD-10-CM | POA: Diagnosis not present

## 2023-10-03 DIAGNOSIS — S63293A Dislocation of distal interphalangeal joint of left middle finger, initial encounter: Secondary | ICD-10-CM | POA: Diagnosis not present

## 2023-10-03 DIAGNOSIS — R1312 Dysphagia, oropharyngeal phase: Secondary | ICD-10-CM | POA: Diagnosis not present

## 2023-10-03 DIAGNOSIS — R278 Other lack of coordination: Secondary | ICD-10-CM | POA: Diagnosis not present

## 2023-10-03 DIAGNOSIS — M6259 Muscle wasting and atrophy, not elsewhere classified, multiple sites: Secondary | ICD-10-CM | POA: Diagnosis not present

## 2023-10-03 DIAGNOSIS — R2689 Other abnormalities of gait and mobility: Secondary | ICD-10-CM | POA: Diagnosis not present

## 2023-10-04 DIAGNOSIS — R278 Other lack of coordination: Secondary | ICD-10-CM | POA: Diagnosis not present

## 2023-10-04 DIAGNOSIS — S63257A Unspecified dislocation of left little finger, initial encounter: Secondary | ICD-10-CM | POA: Diagnosis not present

## 2023-10-04 DIAGNOSIS — R41841 Cognitive communication deficit: Secondary | ICD-10-CM | POA: Diagnosis not present

## 2023-10-04 DIAGNOSIS — R1312 Dysphagia, oropharyngeal phase: Secondary | ICD-10-CM | POA: Diagnosis not present

## 2023-10-04 DIAGNOSIS — E119 Type 2 diabetes mellitus without complications: Secondary | ICD-10-CM | POA: Diagnosis not present

## 2023-10-04 DIAGNOSIS — M6259 Muscle wasting and atrophy, not elsewhere classified, multiple sites: Secondary | ICD-10-CM | POA: Diagnosis not present

## 2023-10-04 DIAGNOSIS — I4891 Unspecified atrial fibrillation: Secondary | ICD-10-CM | POA: Diagnosis not present

## 2023-10-04 DIAGNOSIS — I10 Essential (primary) hypertension: Secondary | ICD-10-CM | POA: Diagnosis not present

## 2023-10-04 DIAGNOSIS — R2681 Unsteadiness on feet: Secondary | ICD-10-CM | POA: Diagnosis not present

## 2023-10-04 DIAGNOSIS — R2689 Other abnormalities of gait and mobility: Secondary | ICD-10-CM | POA: Diagnosis not present

## 2023-10-04 DIAGNOSIS — M6281 Muscle weakness (generalized): Secondary | ICD-10-CM | POA: Diagnosis not present

## 2023-10-04 DIAGNOSIS — Z8719 Personal history of other diseases of the digestive system: Secondary | ICD-10-CM | POA: Diagnosis not present

## 2023-10-05 DIAGNOSIS — M6281 Muscle weakness (generalized): Secondary | ICD-10-CM | POA: Diagnosis not present

## 2023-10-05 DIAGNOSIS — R278 Other lack of coordination: Secondary | ICD-10-CM | POA: Diagnosis not present

## 2023-10-05 DIAGNOSIS — R2689 Other abnormalities of gait and mobility: Secondary | ICD-10-CM | POA: Diagnosis not present

## 2023-10-05 DIAGNOSIS — R41841 Cognitive communication deficit: Secondary | ICD-10-CM | POA: Diagnosis not present

## 2023-10-05 DIAGNOSIS — R1312 Dysphagia, oropharyngeal phase: Secondary | ICD-10-CM | POA: Diagnosis not present

## 2023-10-05 DIAGNOSIS — M6259 Muscle wasting and atrophy, not elsewhere classified, multiple sites: Secondary | ICD-10-CM | POA: Diagnosis not present

## 2023-10-06 DIAGNOSIS — M6259 Muscle wasting and atrophy, not elsewhere classified, multiple sites: Secondary | ICD-10-CM | POA: Diagnosis not present

## 2023-10-06 DIAGNOSIS — M6281 Muscle weakness (generalized): Secondary | ICD-10-CM | POA: Diagnosis not present

## 2023-10-06 DIAGNOSIS — R1312 Dysphagia, oropharyngeal phase: Secondary | ICD-10-CM | POA: Diagnosis not present

## 2023-10-06 DIAGNOSIS — R2689 Other abnormalities of gait and mobility: Secondary | ICD-10-CM | POA: Diagnosis not present

## 2023-10-06 DIAGNOSIS — R41841 Cognitive communication deficit: Secondary | ICD-10-CM | POA: Diagnosis not present

## 2023-10-06 DIAGNOSIS — R278 Other lack of coordination: Secondary | ICD-10-CM | POA: Diagnosis not present

## 2023-10-07 DIAGNOSIS — R41841 Cognitive communication deficit: Secondary | ICD-10-CM | POA: Diagnosis not present

## 2023-10-07 DIAGNOSIS — R1312 Dysphagia, oropharyngeal phase: Secondary | ICD-10-CM | POA: Diagnosis not present

## 2023-10-07 DIAGNOSIS — M6281 Muscle weakness (generalized): Secondary | ICD-10-CM | POA: Diagnosis not present

## 2023-10-07 DIAGNOSIS — R278 Other lack of coordination: Secondary | ICD-10-CM | POA: Diagnosis not present

## 2023-10-07 DIAGNOSIS — M6259 Muscle wasting and atrophy, not elsewhere classified, multiple sites: Secondary | ICD-10-CM | POA: Diagnosis not present

## 2023-10-07 DIAGNOSIS — R2689 Other abnormalities of gait and mobility: Secondary | ICD-10-CM | POA: Diagnosis not present

## 2023-10-11 DIAGNOSIS — S63257D Unspecified dislocation of left little finger, subsequent encounter: Secondary | ICD-10-CM | POA: Diagnosis not present

## 2023-10-11 DIAGNOSIS — M25642 Stiffness of left hand, not elsewhere classified: Secondary | ICD-10-CM | POA: Diagnosis not present

## 2023-10-20 DIAGNOSIS — R4589 Other symptoms and signs involving emotional state: Secondary | ICD-10-CM | POA: Diagnosis not present

## 2023-10-20 DIAGNOSIS — R41841 Cognitive communication deficit: Secondary | ICD-10-CM | POA: Diagnosis not present

## 2023-10-20 DIAGNOSIS — F4324 Adjustment disorder with disturbance of conduct: Secondary | ICD-10-CM | POA: Diagnosis not present

## 2023-10-20 DIAGNOSIS — F411 Generalized anxiety disorder: Secondary | ICD-10-CM | POA: Diagnosis not present

## 2023-10-25 DIAGNOSIS — S63257D Unspecified dislocation of left little finger, subsequent encounter: Secondary | ICD-10-CM | POA: Diagnosis not present

## 2023-11-01 DIAGNOSIS — M6281 Muscle weakness (generalized): Secondary | ICD-10-CM | POA: Diagnosis not present

## 2023-11-01 DIAGNOSIS — M6259 Muscle wasting and atrophy, not elsewhere classified, multiple sites: Secondary | ICD-10-CM | POA: Diagnosis not present

## 2023-11-01 DIAGNOSIS — R2689 Other abnormalities of gait and mobility: Secondary | ICD-10-CM | POA: Diagnosis not present

## 2023-11-01 DIAGNOSIS — R278 Other lack of coordination: Secondary | ICD-10-CM | POA: Diagnosis not present

## 2023-11-01 DIAGNOSIS — R41841 Cognitive communication deficit: Secondary | ICD-10-CM | POA: Diagnosis not present

## 2023-11-01 DIAGNOSIS — R1312 Dysphagia, oropharyngeal phase: Secondary | ICD-10-CM | POA: Diagnosis not present

## 2023-11-02 DIAGNOSIS — R1312 Dysphagia, oropharyngeal phase: Secondary | ICD-10-CM | POA: Diagnosis not present

## 2023-11-02 DIAGNOSIS — R2689 Other abnormalities of gait and mobility: Secondary | ICD-10-CM | POA: Diagnosis not present

## 2023-11-02 DIAGNOSIS — E109 Type 1 diabetes mellitus without complications: Secondary | ICD-10-CM | POA: Diagnosis not present

## 2023-11-02 DIAGNOSIS — M6281 Muscle weakness (generalized): Secondary | ICD-10-CM | POA: Diagnosis not present

## 2023-11-02 DIAGNOSIS — M6259 Muscle wasting and atrophy, not elsewhere classified, multiple sites: Secondary | ICD-10-CM | POA: Diagnosis not present

## 2023-11-02 DIAGNOSIS — R278 Other lack of coordination: Secondary | ICD-10-CM | POA: Diagnosis not present

## 2023-11-02 DIAGNOSIS — R41841 Cognitive communication deficit: Secondary | ICD-10-CM | POA: Diagnosis not present

## 2023-11-02 DIAGNOSIS — I10 Essential (primary) hypertension: Secondary | ICD-10-CM | POA: Diagnosis not present

## 2023-11-03 DIAGNOSIS — E119 Type 2 diabetes mellitus without complications: Secondary | ICD-10-CM | POA: Diagnosis not present

## 2023-11-03 DIAGNOSIS — R2689 Other abnormalities of gait and mobility: Secondary | ICD-10-CM | POA: Diagnosis not present

## 2023-11-03 DIAGNOSIS — M6259 Muscle wasting and atrophy, not elsewhere classified, multiple sites: Secondary | ICD-10-CM | POA: Diagnosis not present

## 2023-11-03 DIAGNOSIS — I10 Essential (primary) hypertension: Secondary | ICD-10-CM | POA: Diagnosis not present

## 2023-11-03 DIAGNOSIS — R41841 Cognitive communication deficit: Secondary | ICD-10-CM | POA: Diagnosis not present

## 2023-11-03 DIAGNOSIS — F200 Paranoid schizophrenia: Secondary | ICD-10-CM | POA: Diagnosis not present

## 2023-11-03 DIAGNOSIS — S63257D Unspecified dislocation of left little finger, subsequent encounter: Secondary | ICD-10-CM | POA: Diagnosis not present

## 2023-11-03 DIAGNOSIS — R1312 Dysphagia, oropharyngeal phase: Secondary | ICD-10-CM | POA: Diagnosis not present

## 2023-11-03 DIAGNOSIS — R278 Other lack of coordination: Secondary | ICD-10-CM | POA: Diagnosis not present

## 2023-11-03 DIAGNOSIS — M6281 Muscle weakness (generalized): Secondary | ICD-10-CM | POA: Diagnosis not present

## 2023-11-04 DIAGNOSIS — R2681 Unsteadiness on feet: Secondary | ICD-10-CM | POA: Diagnosis not present

## 2023-11-04 DIAGNOSIS — L304 Erythema intertrigo: Secondary | ICD-10-CM | POA: Diagnosis not present

## 2023-11-04 DIAGNOSIS — M6281 Muscle weakness (generalized): Secondary | ICD-10-CM | POA: Diagnosis not present

## 2023-11-07 DIAGNOSIS — M6281 Muscle weakness (generalized): Secondary | ICD-10-CM | POA: Diagnosis not present

## 2023-11-07 DIAGNOSIS — R2681 Unsteadiness on feet: Secondary | ICD-10-CM | POA: Diagnosis not present

## 2023-11-08 DIAGNOSIS — M6281 Muscle weakness (generalized): Secondary | ICD-10-CM | POA: Diagnosis not present

## 2023-11-08 DIAGNOSIS — R2681 Unsteadiness on feet: Secondary | ICD-10-CM | POA: Diagnosis not present

## 2023-11-09 DIAGNOSIS — M6281 Muscle weakness (generalized): Secondary | ICD-10-CM | POA: Diagnosis not present

## 2023-11-09 DIAGNOSIS — R2681 Unsteadiness on feet: Secondary | ICD-10-CM | POA: Diagnosis not present

## 2023-11-10 DIAGNOSIS — F0390 Unspecified dementia without behavioral disturbance: Secondary | ICD-10-CM | POA: Diagnosis not present

## 2023-11-10 DIAGNOSIS — M25559 Pain in unspecified hip: Secondary | ICD-10-CM | POA: Diagnosis not present

## 2023-11-10 DIAGNOSIS — R296 Repeated falls: Secondary | ICD-10-CM | POA: Diagnosis not present

## 2023-11-10 DIAGNOSIS — R2681 Unsteadiness on feet: Secondary | ICD-10-CM | POA: Diagnosis not present

## 2023-11-10 DIAGNOSIS — I10 Essential (primary) hypertension: Secondary | ICD-10-CM | POA: Diagnosis not present

## 2023-11-10 DIAGNOSIS — M6281 Muscle weakness (generalized): Secondary | ICD-10-CM | POA: Diagnosis not present

## 2023-11-11 ENCOUNTER — Emergency Department (HOSPITAL_COMMUNITY)

## 2023-11-11 ENCOUNTER — Emergency Department (HOSPITAL_COMMUNITY): Admission: EM | Admit: 2023-11-11 | Discharge: 2023-11-11 | Disposition: A | Discharge status: Home / Self Care

## 2023-11-11 ENCOUNTER — Encounter (HOSPITAL_COMMUNITY): Payer: Self-pay

## 2023-11-11 ENCOUNTER — Other Ambulatory Visit: Payer: Self-pay

## 2023-11-11 DIAGNOSIS — I6782 Cerebral ischemia: Secondary | ICD-10-CM | POA: Diagnosis not present

## 2023-11-11 DIAGNOSIS — R110 Nausea: Secondary | ICD-10-CM | POA: Diagnosis not present

## 2023-11-11 DIAGNOSIS — Z7401 Bed confinement status: Secondary | ICD-10-CM | POA: Diagnosis not present

## 2023-11-11 DIAGNOSIS — S0990XA Unspecified injury of head, initial encounter: Secondary | ICD-10-CM | POA: Diagnosis not present

## 2023-11-11 DIAGNOSIS — M47812 Spondylosis without myelopathy or radiculopathy, cervical region: Secondary | ICD-10-CM | POA: Diagnosis not present

## 2023-11-11 DIAGNOSIS — R0902 Hypoxemia: Secondary | ICD-10-CM | POA: Diagnosis not present

## 2023-11-11 DIAGNOSIS — R519 Headache, unspecified: Secondary | ICD-10-CM | POA: Diagnosis not present

## 2023-11-11 DIAGNOSIS — W050XXA Fall from non-moving wheelchair, initial encounter: Secondary | ICD-10-CM | POA: Diagnosis not present

## 2023-11-11 DIAGNOSIS — R531 Weakness: Secondary | ICD-10-CM | POA: Diagnosis not present

## 2023-11-11 DIAGNOSIS — Z79899 Other long term (current) drug therapy: Secondary | ICD-10-CM | POA: Diagnosis not present

## 2023-11-11 DIAGNOSIS — M5020 Other cervical disc displacement, unspecified cervical region: Secondary | ICD-10-CM | POA: Diagnosis not present

## 2023-11-11 DIAGNOSIS — G4489 Other headache syndrome: Secondary | ICD-10-CM | POA: Diagnosis not present

## 2023-11-11 DIAGNOSIS — I10 Essential (primary) hypertension: Secondary | ICD-10-CM | POA: Insufficient documentation

## 2023-11-11 DIAGNOSIS — R918 Other nonspecific abnormal finding of lung field: Secondary | ICD-10-CM | POA: Diagnosis not present

## 2023-11-11 DIAGNOSIS — R0989 Other specified symptoms and signs involving the circulatory and respiratory systems: Secondary | ICD-10-CM | POA: Diagnosis not present

## 2023-11-11 DIAGNOSIS — N390 Urinary tract infection, site not specified: Secondary | ICD-10-CM | POA: Diagnosis not present

## 2023-11-11 DIAGNOSIS — M47811 Spondylosis without myelopathy or radiculopathy, occipito-atlanto-axial region: Secondary | ICD-10-CM | POA: Diagnosis not present

## 2023-11-11 MED ORDER — IPRATROPIUM-ALBUTEROL 0.5-2.5 (3) MG/3ML IN SOLN
3.0000 mL | Freq: Once | RESPIRATORY_TRACT | Status: AC
  Administered 2023-11-11: 3 mL via RESPIRATORY_TRACT
  Filled 2023-11-11: qty 3

## 2023-11-11 NOTE — ED Notes (Signed)
 Patient transported to CT

## 2023-11-11 NOTE — ED Triage Notes (Signed)
 Patient BIB EMS from [facility] c/o headache and nausea upon awakening this AM. EMS states that the patient fell 2 times yesterday and hit his head during one of those falls. Patient refused to come to ED yesterday. Facility staff report that the second fall was witnessed. Patient is here today due to facility concussion protocol. No blood thinners.

## 2023-11-11 NOTE — ED Provider Notes (Signed)
 [facility] EMERG[facility] DEPARTMENT AT [facility] Provider Note   CSN: [id number] Arrival date & time: 11/11/23  9050     Patient presents with: Headache and Nausea   Steven Simmons is a 83 y.o. male.   83 year old male with past medical history of atrial fibrillation, hypertension, and TIA in the past presenting to the emergency department today with concern for head injury.  The patient was in his wheelchair and states that he slid out twice yesterday.  He denies any focal weakness, numbness, or tingling.  Apparently was complaining of a headache earlier today but currently the patient is denying any headache or nausea.  He denies any complaints at this time.  He states that he is not feeling dizzy or lightheaded and that he slipped out of his wheelchair.  Reports that he has some chronic generalized weakness and was unable to get up for a few minutes early this morning.  He was sent to the ER for further evaluation regarding this.  He is not on any blood thinners.   Headache      Prior to Admission medications   Medication Sig Start Date End Date Taking? Authorizing Provider  acetaminophen  (TYLENOL ) 325 MG tablet Take 2 tablets (650 mg total) by mouth every 6 (six) hours as needed for mild pain (or Fever >/= 101). Patient taking differently: Take 650 mg by mouth in the morning and at bedtime. 06/14/22   Sherrill Cable Latif, DO  acetaminophen  (TYLENOL ) 325 MG tablet Take 650 mg by mouth every 6 (six) hours as needed for mild pain (pain score 1-3), moderate pain (pain score 4-6) or fever.    [provider]  albuterol  (VENTOLIN  HFA) 108 (90 Base) MCG/ACT inhaler Inhale 2 puffs into the lungs every 4 (four) hours as needed for wheezing or shortness of breath (or coughing). 12/25/22   Raford Lenis, MD  calcium  carbonate (TUMS - DOSED IN MG ELEMENTAL CALCIUM ) 500 MG chewable tablet Chew 1,000 mg by mouth 3 (three) times daily as needed for indigestion.    [provider]  Cholecalciferol (VITAMIN D) 50 MCG (2000 UT) CAPS Take 2,000 Units by mouth daily.    [provider]  cloNIDine  (CATAPRES ) 0.1 MG tablet Take 0.1 mg by mouth daily as needed (SBP >180).    [provider]  diltiazem  (CARDIZEM  CD) 120 MG 24 hr capsule Take 1 capsule (120 mg total) by mouth daily. 03/03/22 09/21/23  Jerrol Agent, MD  famotidine  (PEPCID ) 20 MG tablet Take 1 tablet (20 mg total) by mouth at bedtime. 01/13/23   Kerman Vina HERO, NP  fluconazole  (DIFLUCAN ) 150 MG tablet Take 150 mg by mouth once. Patient not taking: Reported on 09/21/2023    [provider]  fluconazole  (DIFLUCAN ) 200 MG tablet Take 200 mg by mouth once.    [provider]  hydrochlorothiazide  (MICROZIDE ) 12.5 MG capsule Take 12.5 mg by mouth daily.    [provider]  ipratropium-albuterol  (DUONEB) 0.5-2.5 (3) MG/3ML SOLN Take 3 mLs by nebulization 2 (two) times daily as needed (for shortness of breath, coughing, or wheezing).    [provider]  labetalol  (NORMODYNE ) 100 MG tablet Take 1 tablet (100 mg total) by mouth 2 (two) times daily. 01/17/23   Raford Lenis, MD  loperamide (IMODIUM A-D) 2 MG tablet Take 2-4 mg by mouth See admin instructions. Take 4 mg by mouth at onset of diarrhea, then 2 mg after each subsequent stool- not to exceed 16 grams/24 hours; call MD  if no relief    [provider]  metFORMIN (GLUCOPHAGE) 500 MG tablet Take 500 mg by mouth 2 (two) times daily with a meal.    [provider]  nitrofurantoin , macrocrystal-monohydrate, (MACROBID ) 100 MG capsule Take 1 capsule (100 mg total) by mouth 2 (two) times daily. 09/21/23   Dasie Faden, MD  OXYGEN Inhale 1-5 L/min into the lungs See admin instructions. 1-5 L/min via nasal cannula and may titrate to keep sats >90% Patient not taking: Reported on 09/21/2023    [provider]  pantoprazole  (PROTONIX ) 40 MG tablet Take 1 tablet (40 mg total) by mouth daily. 05/20/22  09/21/23  Emelia Sluder, PA-C  polyethylene glycol powder (GLYCOLAX /MIRALAX ) 17 GM/SCOOP powder Take 17 g by mouth daily as needed for mild constipation or moderate constipation.    [provider]  potassium chloride  (KLOR-CON ) 10 MEQ tablet Take 10 mEq by mouth daily.    [provider]  potassium chloride  SA (KLOR-CON  M) 20 MEQ tablet Take 1 tablet twice a day for 10 days, then take 1 tablet once daily Patient not taking: Reported on 09/21/2023 01/17/23   Raford Lenis, MD  Skin Protectants, Misc. (EUCERIN) cream Apply 1 Application topically in the morning and at bedtime.    [provider]  sucralfate  (CARAFATE ) 1 g tablet Take 1 tablet (1 g total) by mouth 4 (four) times daily -  with meals and at bedtime. Patient taking differently: Take 1 g by mouth 4 (four) times daily as needed (acid reflux). 06/14/22   Sherrill Alejandro Donovan, DO    Allergies: Ceclor [cefaclor]; Veralipride; Penicillins; Amlodipine ; Antihistamines, chlorpheniramine-type; Beta adrenergic blockers; Celebrex [celecoxib]; Ibuprofen; Lipitor [atorvastatin ]; and Nutrasweet aspartame [aspartame]    Review of Systems  Neurological:  Positive for headaches.  All other systems reviewed and are negative.   Updated Vital Signs BP (!) 141/81   Pulse 78   Temp 99.4 F (37.4 C) (Oral)   Resp (!) 25   SpO2 100%   Physical Exam Vitals and nursing note reviewed.   Gen: NAD Eyes: PERRL, EOMI HEENT: no oropharyngeal swelling Neck: trachea midline, no midline tenderness Resp: clear to auscultation bilaterally Card: RRR, no murmurs, rubs, or gallops Abd: nontender, nondistended Extremities: no calf tenderness, no edema Vascular: 2+ radial pulses bilaterally, 2+ DP pulses bilaterally Neuro: Equal strength and sensation throughout bilateral upper and lower extremities with no dysmetria finger-to-nose testing Skin: no rashes Psyc: acting appropriately   (all labs ordered are listed, but only abnormal  results are displayed) Labs Reviewed - No data to display  EKG: EKG Interpretation Date/Time:  Friday November 11 2023 10:07:48 EDT Ventricular Rate:  62 PR Interval:    QRS Duration:  98 QT Interval:  440 QTC Calculation: 447 R Axis:   8  Text Interpretation: Atrial fibrillation RSR' in V1 or V2, probably normal variant Confirmed by Ula Barter [phone]) on 11/11/2023 10:12:08 AM  Radiology: CT Head Wo Contrast Result Date: 11/11/2023 CLINICAL DATA:  Provided history: Head trauma, minor.  Neck trauma. EXAM: CT HEAD WITHOUT CONTRAST CT CERVICAL SPINE WITHOUT CONTRAST TECHNIQUE: Multidetector CT imaging of the head and cervical spine was performed following the standard protocol without intravenous contrast. Multiplanar CT image reconstructions of the cervical spine were also generated. RADIATION DOSE REDUCTION: This exam was performed according to the departmental dose-optimization program which includes automated exposure control, adjustment of the mA and/or kV according to patient size and/or use of iterative reconstruction technique. COMPARISON:  Head CT 12/02/2022. FINDINGS: CT HEAD  FINDINGS Brain: Generalized cerebral atrophy. Patchy and ill-defined hypoattenuation within the cerebral white matter, nonspecific but compatible with moderate-to-advanced chronic small vessel ischemic disease. Redemonstrated chronic lacunar infarct within the left thalamus. There is no acute intracranial hemorrhage. No demarcated cortical infarct. No extra-axial fluid collection. No evidence of an intracranial mass. No midline shift. Vascular: No hyperdense vessel. Atherosclerotic calcifications. Skull: No calvarial fracture or aggressive osseous lesion. Sinuses/Orbits: No mass or acute finding within the imaged orbits. No significant paranasal sinus disease. CT CERVICAL SPINE FINDINGS Alignment: Slight grade 1 anterolisthesis at C3-C4, C5-C6 and T1-T2. Skull base and vertebrae: The basion-dental and atlanto-dental  intervals are maintained.No evidence of acute fracture to the cervical spine. Soft tissues and spinal canal: No prevertebral fluid or swelling. No visible canal hematoma. Disc levels: Cervical spondylosis with multilevel disc space narrowing, disc bulges/central disc protrusions, posterior disc osteophyte complexes, uncovertebral hypertrophy and facet arthropathy. Disc space narrowing is greatest at C6-C7 (advanced at this level). No appreciable high-grade spinal canal stenosis. Multilevel bony neural foraminal narrowing. Bridging ventrolateral osteophytes at T1-T2 and T2-T3. Degenerative changes also present at the C1-C2 articulation. Upper chest: No consolidation within the imaged lung apices. No visible pneumothorax. IMPRESSION: CT head: 1. No evidence of an acute intracranial abnormality. 2. Parenchymal atrophy and chronic small vessel ischemic disease, as described. CT cervical spine: 1. No evidence of an acute cervical spine fracture. 2. Mild grade 1 anterolisthesis at C3-C4, C5-C6 and T1-T2. 3. Spondylosis at the cervical and visualized upper thoracic levels, as described. Electronically Signed   By: Rockey Childs D.O.   On: 11/11/2023 12:46   CT Cervical Spine Wo Contrast Result Date: 11/11/2023 CLINICAL DATA:  Provided history: Head trauma, minor.  Neck trauma. EXAM: CT HEAD WITHOUT CONTRAST CT CERVICAL SPINE WITHOUT CONTRAST TECHNIQUE: Multidetector CT imaging of the head and cervical spine was performed following the standard protocol without intravenous contrast. Multiplanar CT image reconstructions of the cervical spine were also generated. RADIATION DOSE REDUCTION: This exam was performed according to the departmental dose-optimization program which includes automated exposure control, adjustment of the mA and/or kV according to patient size and/or use of iterative reconstruction technique. COMPARISON:  Head CT 12/02/2022. FINDINGS: CT HEAD FINDINGS Brain: Generalized cerebral atrophy. Patchy and  ill-defined hypoattenuation within the cerebral white matter, nonspecific but compatible with moderate-to-advanced chronic small vessel ischemic disease. Redemonstrated chronic lacunar infarct within the left thalamus. There is no acute intracranial hemorrhage. No demarcated cortical infarct. No extra-axial fluid collection. No evidence of an intracranial mass. No midline shift. Vascular: No hyperdense vessel. Atherosclerotic calcifications. Skull: No calvarial fracture or aggressive osseous lesion. Sinuses/Orbits: No mass or acute finding within the imaged orbits. No significant paranasal sinus disease. CT CERVICAL SPINE FINDINGS Alignment: Slight grade 1 anterolisthesis at C3-C4, C5-C6 and T1-T2. Skull base and vertebrae: The basion-dental and atlanto-dental intervals are maintained.No evidence of acute fracture to the cervical spine. Soft tissues and spinal canal: No prevertebral fluid or swelling. No visible canal hematoma. Disc levels: Cervical spondylosis with multilevel disc space narrowing, disc bulges/central disc protrusions, posterior disc osteophyte complexes, uncovertebral hypertrophy and facet arthropathy. Disc space narrowing is greatest at C6-C7 (advanced at this level). No appreciable high-grade spinal canal stenosis. Multilevel bony neural foraminal narrowing. Bridging ventrolateral osteophytes at T1-T2 and T2-T3. Degenerative changes also present at the C1-C2 articulation. Upper chest: No consolidation within the imaged lung apices. No visible pneumothorax. IMPRESSION: CT head: 1. No evidence of an acute intracranial abnormality. 2. Parenchymal atrophy and chronic small vessel ischemic disease, as  described. CT cervical spine: 1. No evidence of an acute cervical spine fracture. 2. Mild grade 1 anterolisthesis at C3-C4, C5-C6 and T1-T2. 3. Spondylosis at the cervical and visualized upper thoracic levels, as described. Electronically Signed   By: Rockey Childs D.O.   On: 11/11/2023 12:46   DG Chest  Portable 1 View Result Date: 11/11/2023 EXAM: 1 VIEW XRAY OF THE CHEST 11/11/2023 10:40:00 AM COMPARISON: 09/21/2023 CLINICAL HISTORY: Fall. Reason for exam: fall; headache; nausea FINDINGS: LUNGS AND PLEURA: Low lung volumes. Patchy airspace opacities in the left lower lung as before. No pulmonary edema. No pleural effusion. No pneumothorax. HEART AND MEDIASTINUM: Atheromatous aorta. No acute abnormality of the cardiac and mediastinal silhouettes. BONES AND SOFT TISSUES: Left shoulder degenerative joint disease. No acute osseous abnormality. IMPRESSION: 1. No acute process. 2. Patchy airspace opacities in the left lower lung, unchanged from prior study on 09/21/2023. Electronically signed by: Katheleen Faes MD 11/11/2023 11:24 AM EDT RP Workstation: [id number]     Procedures   Medications Ordered in the ED  ipratropium-albuterol  (DUONEB) 0.5-2.5 (3) MG/3ML nebulizer solution 3 mL (3 mLs Nebulization Given 11/11/23 1044)                                    Medical Decision Making 83 year old male with past medical history of hypertension, TIA, and migraine headaches in the past presenting to the emergency department today with no complaints after a mechanical fall from his wheelchair x 2 yesterday.  The patient is neuroexam is reassuring.  Will further evaluate him here with a CT scan of his head and cervical spine in addition to a chest x-ray to evaluate for acute traumatic injuries.  Pulse ox here is 93% which I suspect is more of a chronic finding given his age and the patient is denying any cough or shortness of breath.  The patient is in rate controlled atrial fibrillation.  Looking back through his chart it appears that he is in persistent atrial fibrillation.  Will keep him on the monitor here for this.  I will reevaluate for ultimate disposition.  With no symptoms I think that if his workup is unremarkable that he may be safely discharged.  The patient CT scans do not show any concerning  findings.  There is some mild anterolisthesis on CT.  The patient is nontender over his lower cervical or upper thoracic region so suspicion for acute injury causing this is low at this time.  The patient remains neurovascularly intact here.  I think he is stable for discharge back to his nursing facility.  Amount and/or Complexity of Data Reviewed Radiology: ordered.  Risk Prescription drug management.        Final diagnoses:  Closed head injury, initial encounter    ED Discharge Orders     None          Ula Prentice SAUNDERS, MD 11/11/23 1254

## 2023-11-11 NOTE — ED Notes (Signed)
 Called [facility] for pt pick

## 2023-11-11 NOTE — Discharge Instructions (Signed)
 Your CT scans did not show any acute findings.  Please follow-up with your doctor and return to the ER for worsening symptoms.

## 2023-11-13 DIAGNOSIS — R2681 Unsteadiness on feet: Secondary | ICD-10-CM | POA: Diagnosis not present

## 2023-11-13 DIAGNOSIS — M6281 Muscle weakness (generalized): Secondary | ICD-10-CM | POA: Diagnosis not present

## 2023-11-14 DIAGNOSIS — R2681 Unsteadiness on feet: Secondary | ICD-10-CM | POA: Diagnosis not present

## 2023-11-14 DIAGNOSIS — M6281 Muscle weakness (generalized): Secondary | ICD-10-CM | POA: Diagnosis not present

## 2023-11-15 DIAGNOSIS — R2681 Unsteadiness on feet: Secondary | ICD-10-CM | POA: Diagnosis not present

## 2023-11-15 DIAGNOSIS — M6281 Muscle weakness (generalized): Secondary | ICD-10-CM | POA: Diagnosis not present

## 2023-11-16 DIAGNOSIS — M6281 Muscle weakness (generalized): Secondary | ICD-10-CM | POA: Diagnosis not present

## 2023-11-16 DIAGNOSIS — R2681 Unsteadiness on feet: Secondary | ICD-10-CM | POA: Diagnosis not present

## 2023-11-18 DIAGNOSIS — M6281 Muscle weakness (generalized): Secondary | ICD-10-CM | POA: Diagnosis not present

## 2023-11-18 DIAGNOSIS — R41841 Cognitive communication deficit: Secondary | ICD-10-CM | POA: Diagnosis not present

## 2023-11-18 DIAGNOSIS — F4324 Adjustment disorder with disturbance of conduct: Secondary | ICD-10-CM | POA: Diagnosis not present

## 2023-11-18 DIAGNOSIS — R4589 Other symptoms and signs involving emotional state: Secondary | ICD-10-CM | POA: Diagnosis not present

## 2023-11-18 DIAGNOSIS — R2681 Unsteadiness on feet: Secondary | ICD-10-CM | POA: Diagnosis not present

## 2023-11-18 DIAGNOSIS — N390 Urinary tract infection, site not specified: Secondary | ICD-10-CM | POA: Diagnosis not present

## 2023-11-18 DIAGNOSIS — F411 Generalized anxiety disorder: Secondary | ICD-10-CM | POA: Diagnosis not present

## 2023-11-21 DIAGNOSIS — M6281 Muscle weakness (generalized): Secondary | ICD-10-CM | POA: Diagnosis not present

## 2023-11-21 DIAGNOSIS — R2681 Unsteadiness on feet: Secondary | ICD-10-CM | POA: Diagnosis not present

## 2023-11-22 DIAGNOSIS — M6281 Muscle weakness (generalized): Secondary | ICD-10-CM | POA: Diagnosis not present

## 2023-11-22 DIAGNOSIS — R2681 Unsteadiness on feet: Secondary | ICD-10-CM | POA: Diagnosis not present

## 2023-11-23 DIAGNOSIS — M6281 Muscle weakness (generalized): Secondary | ICD-10-CM | POA: Diagnosis not present

## 2023-11-23 DIAGNOSIS — R2681 Unsteadiness on feet: Secondary | ICD-10-CM | POA: Diagnosis not present

## 2023-11-25 DIAGNOSIS — M6281 Muscle weakness (generalized): Secondary | ICD-10-CM | POA: Diagnosis not present

## 2023-11-25 DIAGNOSIS — R2681 Unsteadiness on feet: Secondary | ICD-10-CM | POA: Diagnosis not present

## 2023-11-28 DIAGNOSIS — M6281 Muscle weakness (generalized): Secondary | ICD-10-CM | POA: Diagnosis not present

## 2023-11-28 DIAGNOSIS — R2681 Unsteadiness on feet: Secondary | ICD-10-CM | POA: Diagnosis not present

## 2023-11-29 DIAGNOSIS — R2681 Unsteadiness on feet: Secondary | ICD-10-CM | POA: Diagnosis not present

## 2023-11-29 DIAGNOSIS — M6281 Muscle weakness (generalized): Secondary | ICD-10-CM | POA: Diagnosis not present

## 2023-11-30 DIAGNOSIS — E109 Type 1 diabetes mellitus without complications: Secondary | ICD-10-CM | POA: Diagnosis not present

## 2023-11-30 DIAGNOSIS — M6281 Muscle weakness (generalized): Secondary | ICD-10-CM | POA: Diagnosis not present

## 2023-11-30 DIAGNOSIS — I10 Essential (primary) hypertension: Secondary | ICD-10-CM | POA: Diagnosis not present

## 2023-11-30 DIAGNOSIS — R2681 Unsteadiness on feet: Secondary | ICD-10-CM | POA: Diagnosis not present

## 2023-12-01 DIAGNOSIS — E119 Type 2 diabetes mellitus without complications: Secondary | ICD-10-CM | POA: Diagnosis not present

## 2023-12-01 DIAGNOSIS — J449 Chronic obstructive pulmonary disease, unspecified: Secondary | ICD-10-CM | POA: Diagnosis not present

## 2023-12-01 DIAGNOSIS — I10 Essential (primary) hypertension: Secondary | ICD-10-CM | POA: Diagnosis not present

## 2023-12-01 DIAGNOSIS — F200 Paranoid schizophrenia: Secondary | ICD-10-CM | POA: Diagnosis not present

## 2023-12-02 DIAGNOSIS — F4324 Adjustment disorder with disturbance of conduct: Secondary | ICD-10-CM | POA: Diagnosis not present

## 2023-12-02 DIAGNOSIS — F411 Generalized anxiety disorder: Secondary | ICD-10-CM | POA: Diagnosis not present

## 2023-12-02 DIAGNOSIS — R4589 Other symptoms and signs involving emotional state: Secondary | ICD-10-CM | POA: Diagnosis not present

## 2023-12-02 DIAGNOSIS — R41841 Cognitive communication deficit: Secondary | ICD-10-CM | POA: Diagnosis not present

## 2023-12-05 DIAGNOSIS — M6281 Muscle weakness (generalized): Secondary | ICD-10-CM | POA: Diagnosis not present

## 2023-12-05 DIAGNOSIS — R2681 Unsteadiness on feet: Secondary | ICD-10-CM | POA: Diagnosis not present

## 2023-12-06 DIAGNOSIS — R2681 Unsteadiness on feet: Secondary | ICD-10-CM | POA: Diagnosis not present

## 2023-12-06 DIAGNOSIS — M6281 Muscle weakness (generalized): Secondary | ICD-10-CM | POA: Diagnosis not present

## 2023-12-07 DIAGNOSIS — R2681 Unsteadiness on feet: Secondary | ICD-10-CM | POA: Diagnosis not present

## 2023-12-07 DIAGNOSIS — M6281 Muscle weakness (generalized): Secondary | ICD-10-CM | POA: Diagnosis not present

## 2023-12-08 DIAGNOSIS — M6281 Muscle weakness (generalized): Secondary | ICD-10-CM | POA: Diagnosis not present

## 2023-12-08 DIAGNOSIS — R2681 Unsteadiness on feet: Secondary | ICD-10-CM | POA: Diagnosis not present

## 2023-12-12 DIAGNOSIS — R2681 Unsteadiness on feet: Secondary | ICD-10-CM | POA: Diagnosis not present

## 2023-12-12 DIAGNOSIS — M6281 Muscle weakness (generalized): Secondary | ICD-10-CM | POA: Diagnosis not present

## 2023-12-13 DIAGNOSIS — R2681 Unsteadiness on feet: Secondary | ICD-10-CM | POA: Diagnosis not present

## 2023-12-13 DIAGNOSIS — M6281 Muscle weakness (generalized): Secondary | ICD-10-CM | POA: Diagnosis not present

## 2023-12-18 DIAGNOSIS — M6281 Muscle weakness (generalized): Secondary | ICD-10-CM | POA: Diagnosis not present

## 2023-12-18 DIAGNOSIS — R2681 Unsteadiness on feet: Secondary | ICD-10-CM | POA: Diagnosis not present

## 2023-12-19 DIAGNOSIS — M6281 Muscle weakness (generalized): Secondary | ICD-10-CM | POA: Diagnosis not present

## 2023-12-19 DIAGNOSIS — R2681 Unsteadiness on feet: Secondary | ICD-10-CM | POA: Diagnosis not present

## 2023-12-20 DIAGNOSIS — Z23 Encounter for immunization: Secondary | ICD-10-CM | POA: Diagnosis not present

## 2023-12-21 DIAGNOSIS — M6281 Muscle weakness (generalized): Secondary | ICD-10-CM | POA: Diagnosis not present

## 2023-12-21 DIAGNOSIS — R2681 Unsteadiness on feet: Secondary | ICD-10-CM | POA: Diagnosis not present

## 2023-12-22 DIAGNOSIS — R2681 Unsteadiness on feet: Secondary | ICD-10-CM | POA: Diagnosis not present

## 2023-12-22 DIAGNOSIS — M6281 Muscle weakness (generalized): Secondary | ICD-10-CM | POA: Diagnosis not present

## 2023-12-23 DIAGNOSIS — M6281 Muscle weakness (generalized): Secondary | ICD-10-CM | POA: Diagnosis not present

## 2023-12-23 DIAGNOSIS — R2681 Unsteadiness on feet: Secondary | ICD-10-CM | POA: Diagnosis not present

## 2023-12-30 DIAGNOSIS — E109 Type 1 diabetes mellitus without complications: Secondary | ICD-10-CM | POA: Diagnosis not present

## 2023-12-30 DIAGNOSIS — F4324 Adjustment disorder with disturbance of conduct: Secondary | ICD-10-CM | POA: Diagnosis not present

## 2023-12-30 DIAGNOSIS — I10 Essential (primary) hypertension: Secondary | ICD-10-CM | POA: Diagnosis not present

## 2023-12-30 DIAGNOSIS — R41841 Cognitive communication deficit: Secondary | ICD-10-CM | POA: Diagnosis not present

## 2023-12-30 DIAGNOSIS — E119 Type 2 diabetes mellitus without complications: Secondary | ICD-10-CM | POA: Diagnosis not present

## 2023-12-30 DIAGNOSIS — R4589 Other symptoms and signs involving emotional state: Secondary | ICD-10-CM | POA: Diagnosis not present

## 2023-12-30 DIAGNOSIS — F411 Generalized anxiety disorder: Secondary | ICD-10-CM | POA: Diagnosis not present

## 2023-12-30 DIAGNOSIS — J449 Chronic obstructive pulmonary disease, unspecified: Secondary | ICD-10-CM | POA: Diagnosis not present

## 2024-01-13 DIAGNOSIS — F411 Generalized anxiety disorder: Secondary | ICD-10-CM | POA: Diagnosis not present

## 2024-01-13 DIAGNOSIS — R4589 Other symptoms and signs involving emotional state: Secondary | ICD-10-CM | POA: Diagnosis not present

## 2024-01-13 DIAGNOSIS — R41841 Cognitive communication deficit: Secondary | ICD-10-CM | POA: Diagnosis not present

## 2024-01-13 DIAGNOSIS — F4324 Adjustment disorder with disturbance of conduct: Secondary | ICD-10-CM | POA: Diagnosis not present

## 2024-01-18 DIAGNOSIS — M6281 Muscle weakness (generalized): Secondary | ICD-10-CM | POA: Diagnosis not present

## 2024-01-19 DIAGNOSIS — M6281 Muscle weakness (generalized): Secondary | ICD-10-CM | POA: Diagnosis not present

## 2024-01-23 DIAGNOSIS — M6281 Muscle weakness (generalized): Secondary | ICD-10-CM | POA: Diagnosis not present

## 2024-01-24 DIAGNOSIS — M6281 Muscle weakness (generalized): Secondary | ICD-10-CM | POA: Diagnosis not present

## 2024-01-25 DIAGNOSIS — M6281 Muscle weakness (generalized): Secondary | ICD-10-CM | POA: Diagnosis not present

## 2024-01-26 DIAGNOSIS — M6281 Muscle weakness (generalized): Secondary | ICD-10-CM | POA: Diagnosis not present

## 2024-01-27 DIAGNOSIS — M6281 Muscle weakness (generalized): Secondary | ICD-10-CM | POA: Diagnosis not present

## 2024-01-31 DIAGNOSIS — M6281 Muscle weakness (generalized): Secondary | ICD-10-CM | POA: Diagnosis not present

## 2024-02-01 DIAGNOSIS — M6281 Muscle weakness (generalized): Secondary | ICD-10-CM | POA: Diagnosis not present

## 2024-02-02 DIAGNOSIS — M6281 Muscle weakness (generalized): Secondary | ICD-10-CM | POA: Diagnosis not present

## 2024-02-03 DIAGNOSIS — F411 Generalized anxiety disorder: Secondary | ICD-10-CM | POA: Diagnosis not present

## 2024-02-03 DIAGNOSIS — F4324 Adjustment disorder with disturbance of conduct: Secondary | ICD-10-CM | POA: Diagnosis not present

## 2024-02-03 DIAGNOSIS — R4589 Other symptoms and signs involving emotional state: Secondary | ICD-10-CM | POA: Diagnosis not present

## 2024-02-03 DIAGNOSIS — R41841 Cognitive communication deficit: Secondary | ICD-10-CM | POA: Diagnosis not present

## 2024-02-04 DIAGNOSIS — M6281 Muscle weakness (generalized): Secondary | ICD-10-CM | POA: Diagnosis not present

## 2024-02-04 DIAGNOSIS — R2681 Unsteadiness on feet: Secondary | ICD-10-CM | POA: Diagnosis not present

## 2024-02-06 DIAGNOSIS — M6281 Muscle weakness (generalized): Secondary | ICD-10-CM | POA: Diagnosis not present

## 2024-02-06 DIAGNOSIS — R2681 Unsteadiness on feet: Secondary | ICD-10-CM | POA: Diagnosis not present

## 2024-02-07 DIAGNOSIS — R2681 Unsteadiness on feet: Secondary | ICD-10-CM | POA: Diagnosis not present

## 2024-02-07 DIAGNOSIS — M6281 Muscle weakness (generalized): Secondary | ICD-10-CM | POA: Diagnosis not present

## 2024-02-08 ENCOUNTER — Emergency Department (HOSPITAL_COMMUNITY)

## 2024-02-08 ENCOUNTER — Encounter (HOSPITAL_COMMUNITY): Payer: Self-pay

## 2024-02-08 ENCOUNTER — Emergency Department (HOSPITAL_COMMUNITY)
Admission: EM | Admit: 2024-02-08 | Discharge: 2024-02-08 | Disposition: A | Discharge status: Skilled Nursing Facility | Source: Skilled Nursing Facility | Attending: Emergency Medicine | Admitting: Emergency Medicine

## 2024-02-08 DIAGNOSIS — Z9981 Dependence on supplemental oxygen: Secondary | ICD-10-CM | POA: Insufficient documentation

## 2024-02-08 DIAGNOSIS — K219 Gastro-esophageal reflux disease without esophagitis: Secondary | ICD-10-CM | POA: Insufficient documentation

## 2024-02-08 DIAGNOSIS — I10 Essential (primary) hypertension: Secondary | ICD-10-CM | POA: Insufficient documentation

## 2024-02-08 DIAGNOSIS — J449 Chronic obstructive pulmonary disease, unspecified: Secondary | ICD-10-CM | POA: Diagnosis not present

## 2024-02-08 DIAGNOSIS — R220 Localized swelling, mass and lump, head: Secondary | ICD-10-CM | POA: Diagnosis not present

## 2024-02-08 DIAGNOSIS — W19XXXA Unspecified fall, initial encounter: Secondary | ICD-10-CM | POA: Diagnosis not present

## 2024-02-08 DIAGNOSIS — M47812 Spondylosis without myelopathy or radiculopathy, cervical region: Secondary | ICD-10-CM | POA: Diagnosis not present

## 2024-02-08 DIAGNOSIS — M8580 Other specified disorders of bone density and structure, unspecified site: Secondary | ICD-10-CM | POA: Diagnosis not present

## 2024-02-08 DIAGNOSIS — I6789 Other cerebrovascular disease: Secondary | ICD-10-CM | POA: Diagnosis not present

## 2024-02-08 DIAGNOSIS — R410 Disorientation, unspecified: Secondary | ICD-10-CM | POA: Diagnosis not present

## 2024-02-08 DIAGNOSIS — Z043 Encounter for examination and observation following other accident: Secondary | ICD-10-CM | POA: Diagnosis present

## 2024-02-08 DIAGNOSIS — R531 Weakness: Secondary | ICD-10-CM | POA: Diagnosis not present

## 2024-02-08 DIAGNOSIS — S098XXA Other specified injuries of head, initial encounter: Secondary | ICD-10-CM | POA: Diagnosis not present

## 2024-02-08 DIAGNOSIS — S199XXA Unspecified injury of neck, initial encounter: Secondary | ICD-10-CM | POA: Diagnosis not present

## 2024-02-08 DIAGNOSIS — M4312 Spondylolisthesis, cervical region: Secondary | ICD-10-CM | POA: Diagnosis not present

## 2024-02-08 DIAGNOSIS — W050XXA Fall from non-moving wheelchair, initial encounter: Secondary | ICD-10-CM | POA: Diagnosis not present

## 2024-02-08 DIAGNOSIS — Z79899 Other long term (current) drug therapy: Secondary | ICD-10-CM | POA: Insufficient documentation

## 2024-02-08 DIAGNOSIS — S0990XA Unspecified injury of head, initial encounter: Secondary | ICD-10-CM | POA: Diagnosis not present

## 2024-02-08 DIAGNOSIS — Z7984 Long term (current) use of oral hypoglycemic drugs: Secondary | ICD-10-CM | POA: Diagnosis not present

## 2024-02-08 DIAGNOSIS — E785 Hyperlipidemia, unspecified: Secondary | ICD-10-CM | POA: Diagnosis not present

## 2024-02-08 MED ORDER — ACETAMINOPHEN 325 MG PO TABS
650.0000 mg | ORAL_TABLET | Freq: Once | ORAL | Status: AC
  Administered 2024-02-08: 650 mg via ORAL
  Filled 2024-02-08: qty 2

## 2024-02-08 NOTE — ED Notes (Signed)
 Called [facility] for transport of pt back to [facility]

## 2024-02-08 NOTE — ED Notes (Signed)
 Calling report to SNF - [facility].  Spoke with Grayce in [facility] where pt resides.  Gave report.

## 2024-02-08 NOTE — ED Provider Notes (Signed)
 [place] EMERGENCY DEPARTMENT AT [facility] Provider Note   CSN: [id number] Arrival date & time: 02/08/24  9167     Patient presents with: [facility] is a 83 y.o. male with past medical history seen for hypertension, hyperlipidemia, GERD, COPD who is from facility for evaluation of head injury.  Code stroke.  He slid from his wheelchair and struck his head on the floor.  No obvious injury or hematoma noted.  Initially 88% on room air on arrival.  Denies shortness of breath.  Does use oxygen at his facility, normal oxygen saturation on 2 L nasal cannula.    Fall       Prior to Admission medications   Medication Sig Start Date End Date Taking? Authorizing Provider  acetaminophen  (TYLENOL ) 325 MG tablet Take 2 tablets (650 mg total) by mouth every 6 (six) hours as needed for mild pain (or Fever >/= 101). Patient taking differently: Take 650 mg by mouth in the morning and at bedtime. 06/14/22   Sherrill Cable Latif, DO  acetaminophen  (TYLENOL ) 325 MG tablet Take 650 mg by mouth every 6 (six) hours as needed for mild pain (pain score 1-3), moderate pain (pain score 4-6) or fever.    [provider]  albuterol  (VENTOLIN  HFA) 108 (90 Base) MCG/ACT inhaler Inhale 2 puffs into the lungs every 4 (four) hours as needed for wheezing or shortness of breath (or coughing). 12/25/22   Raford Lenis, MD  calcium  carbonate (TUMS - DOSED IN MG ELEMENTAL CALCIUM ) 500 MG chewable tablet Chew 1,000 mg by mouth 3 (three) times daily as needed for indigestion.    [provider]  Cholecalciferol (VITAMIN D) 50 MCG (2000 UT) CAPS Take 2,000 Units by mouth daily.    [provider]  cloNIDine  (CATAPRES ) 0.1 MG tablet Take 0.1 mg by mouth daily as needed (SBP >180).    [provider]  diltiazem  (CARDIZEM  CD) 120 MG 24 hr capsule Take 1 capsule (120 mg total) by mouth daily. 03/03/22 09/21/23  Jerrol Agent, MD  famotidine  (PEPCID ) 20 MG tablet Take 1  tablet (20 mg total) by mouth at bedtime. 01/13/23   Kerman Vina HERO, NP  fluconazole  (DIFLUCAN ) 150 MG tablet Take 150 mg by mouth once. Patient not taking: Reported on 09/21/2023    [provider]  fluconazole  (DIFLUCAN ) 200 MG tablet Take 200 mg by mouth once.    [provider]  hydrochlorothiazide  (MICROZIDE ) 12.5 MG capsule Take 12.5 mg by mouth daily.    [provider]  ipratropium-albuterol  (DUONEB) 0.5-2.5 (3) MG/3ML SOLN Take 3 mLs by nebulization 2 (two) times daily as needed (for shortness of breath, coughing, or wheezing).    [provider]  labetalol  (NORMODYNE ) 100 MG tablet Take 1 tablet (100 mg total) by mouth 2 (two) times daily. 01/17/23   Raford Lenis, MD  loperamide (IMODIUM A-D) 2 MG tablet Take 2-4 mg by mouth See admin instructions. Take 4 mg by mouth at onset of diarrhea, then 2 mg after each subsequent stool- not to exceed 16 grams/24 hours; call MD if no relief    [provider]  metFORMIN (GLUCOPHAGE) 500 MG tablet Take 500 mg by mouth 2 (two) times daily with a meal.    [provider]  nitrofurantoin , macrocrystal-monohydrate, (MACROBID ) 100 MG capsule Take 1 capsule (100 mg total) by mouth 2 (two) times daily. 09/21/23   Dasie Faden, MD  OXYGEN Inhale 1-5 L/min into the lungs See admin instructions. 1-5  L/min via nasal cannula and may titrate to keep sats >90% Patient not taking: Reported on 09/21/2023    [provider]  pantoprazole  (PROTONIX ) 40 MG tablet Take 1 tablet (40 mg total) by mouth daily. 05/20/22 09/21/23  Emelia Sluder, PA-C  polyethylene glycol powder (GLYCOLAX /MIRALAX ) 17 GM/SCOOP powder Take 17 g by mouth daily as needed for mild constipation or moderate constipation.    [provider]  potassium chloride  (KLOR-CON ) 10 MEQ tablet Take 10 mEq by mouth daily.    [provider]  potassium chloride  SA (KLOR-CON  M) 20 MEQ tablet Take 1 tablet twice a day for 10 days, then  take 1 tablet once daily Patient not taking: Reported on 09/21/2023 01/17/23   Raford Lenis, MD  Skin Protectants, Misc. (EUCERIN) cream Apply 1 Application topically in the morning and at bedtime.    [provider]  sucralfate  (CARAFATE ) 1 g tablet Take 1 tablet (1 g total) by mouth 4 (four) times daily -  with meals and at bedtime. Patient taking differently: Take 1 g by mouth 4 (four) times daily as needed (acid reflux). 06/14/22   Sherrill Alejandro Donovan, DO    Allergies: Ceclor [cefaclor]; Veralipride; Penicillins; Amlodipine ; Antihistamines, chlorpheniramine-type; Beta adrenergic blockers; Celebrex [celecoxib]; Ibuprofen; Lipitor [atorvastatin ]; and Nutrasweet aspartame [aspartame]    Review of Systems  All other systems reviewed and are negative.   Updated Vital Signs BP (!) 168/94 (BP Location: Right Arm)   Pulse (!) 58   Temp 98.6 F (37 C) (Oral)   Resp 18   SpO2 95%   Physical Exam Vitals and nursing note reviewed.  Constitutional:      General: He is not in acute distress.    Appearance: Normal appearance.  HENT:     Head: Normocephalic and atraumatic.  Eyes:     General:        Right eye: No discharge.        Left eye: No discharge.  Cardiovascular:     Rate and Rhythm: Normal rate and regular rhythm.     Heart sounds: No murmur heard.    No friction rub. No gallop.  Pulmonary:     Effort: Pulmonary effort is normal.     Breath sounds: Normal breath sounds.     Comments: No wheezing, rhonchi, stridor, rales Abdominal:     General: Bowel sounds are normal.     Palpations: Abdomen is soft.  Musculoskeletal:     Comments: Atrumatic head, neck, intact strength 5/5 of bilateral upper extremities, global lower extremity weakness, 4/5 similar compared to known baseline  Skin:    General: Skin is warm and dry.     Capillary Refill: Capillary refill takes less than 2 seconds.  Neurological:     Mental Status: He is alert and oriented to person, place, and  time.  Psychiatric:        Mood and Affect: Mood normal.        Behavior: Behavior normal.     (all labs ordered are listed, but only abnormal results are displayed) Labs Reviewed - No data to display  EKG: None  Radiology: CT Cervical Spine Wo Contrast Result Date: 02/08/2024 EXAM: CT CERVICAL SPINE WITHOUT CONTRAST 02/08/2024 09:31:46 AM TECHNIQUE: CT of the cervical spine was performed without the administration of intravenous contrast. Multiplanar reformatted images are provided for review. Automated exposure control, iterative reconstruction, and/or weight based adjustment of the mA/kV was utilized to reduce the radiation dose to as low as reasonably achievable. COMPARISON:  Head CT 02/08/2024 reported separately. Cervical spine CT 11/11/2023. CLINICAL HISTORY: 83 year old male. Neck trauma. FINDINGS: CERVICAL SPINE: BONES AND ALIGNMENT: No acute fracture or traumatic malalignment. Maintained cervical lordosis. Subtle chronic anterolisthesis of C5 on C6 appears degenerative. Degenerative partially calcified ligamentous hypertrophy about the odontoid. Chronic anterior C1 odontoid degeneration stable background bone mineralization. DEGENERATIVE CHANGES: Multilevel cervical facet arthropathy with vacuum facet on the left at C5-C6. Absent and carious dentition. Asymmetric advanced degeneration of the right sternoclavicular joint. Upper thoracic hyperostosis with interbody ankylosis. SOFT TISSUES: No prevertebral soft tissue swelling. Mild respiratory motion in the visible upper lungs. IMPRESSION: 1. No acute traumatic injury identified in the cervical spine. 2. Stable chronic cervical spine degeneration. Electronically signed by: Helayne Hurst MD 02/08/2024 09:42 AM EST RP Workstation: [id number]   CT Head Wo Contrast Result Date: 02/08/2024 EXAM: CT HEAD WITHOUT CONTRAST 02/08/2024 09:31:46 AM TECHNIQUE: CT of the head was performed without the administration of intravenous contrast. Automated  exposure control, iterative reconstruction, and/or weight based adjustment of the mA/kV was utilized to reduce the radiation dose to as low as reasonably achievable. COMPARISON: Brain MRI 07/28/2015. Head CT 11/11/2023. CLINICAL HISTORY: 83 year old male. Head trauma, minor (Age >= 65y). FINDINGS: BRAIN AND VENTRICLES: No acute hemorrhage. No evidence of acute infarct. No hydrocephalus. No extra-axial collection. No mass effect or midline shift. Stable brain volume. 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 Chronic patchy and confluent bilateral cerebral white matter hypodensity. Chronic lacunar infarct left thalamus. Stable gray white differentiation. Calcified atherosclerosis at the skull base. No suspicious intracranial vascular hyperdensity. ORBITS: No acute abnormality. Orbital soft tissues appear stable. SINUSES: Visible paranasal sinuses, middle ears and mastoids remain well aerated. SOFT TISSUES AND SKULL: Left anterior convexity scalp soft tissue swelling and stranding series 5 image 31. Underlying left humeral bone appears intact. No scalp soft tissue gas. No skull fracture. IMPRESSION: 1. Scalp soft tissue injury. 2. No acute intracranial abnormality. 3. Stable non-contrast CT appearance of chronic small vessel disease, mesial temporal lobe atrophy. Electronically signed by: Helayne Hurst MD 02/08/2024 09:36 AM EST RP Workstation: [id number]     Procedures   Medications Ordered in the ED  acetaminophen  (TYLENOL ) tablet 650 mg (has no administration in time range)                                    Medical Decision Making Amount and/or Complexity of Data Reviewed Radiology: ordered.  Risk OTC drugs.   This patient is a 83 y.o. male  who presents to the ED for concern of fall, head injury.   Differential diagnoses prior to evaluation: The emergent differential diagnosis includes, but is not limited to,  epidural hematoma, subdural hematoma,  skull fracture, subarachnoid hemorrhage, unstable cervical spine fracture, concussion vs other MSK injury . This is not an exhaustive differential.   Past Medical History / Co-morbidities / Social History:  hypertension, hyperlipidemia, GERD, COPD  Additional history: Chart reviewed. Pertinent results include: Reviewed lab  Physical Exam: Physical exam performed. The pertinent findings include: Moving all 4 limbs spontaneously, no obvious signs of trauma, no wheezing, rhonchi, initially with mild hypoxia reportedly 88% on room air on arrival, still requiring some oxygen, however he has chronic COPD and reports that he uses oxygen at his facility.  Lab Tests/Imaging studies: I personally interpreted labs/imaging and the pertinent results include: CT head, CT C-spine with no acute traumatic  injury, no intracranial bleed   Disposition: After consideration of the diagnostic results and the patients response to treatment, I feel that no acute traumatic injury, patient feeling fine, stable for return to facility.   emergency department workup does not suggest an emergent condition requiring admission or immediate intervention beyond what has been performed at this time. The plan is: as above. The patient is safe for discharge and has been instructed to return immediately for worsening symptoms, change in symptoms or any other concerns.   Final diagnoses:  None    ED Discharge Orders     None          Rosan Sherlean VEAR DEVONNA 02/08/24 1007    Suzette Pac, MD 02/10/24 [phone]

## 2024-02-08 NOTE — ED Triage Notes (Addendum)
 Pt BIBA from [facility] for evaluation of head injury after sliding from wheelchair and striking head on the floor.  No obvious injury or hematoma noted. Pt was 88% on RA on arrival. Placed on 2L [place].

## 2024-02-08 NOTE — Discharge Instructions (Signed)
 Please use Tylenol  for pain as needed.  You may use up to 1000 mg of Tylenol  every 6 hours.  Not to exceed 4 g of Tylenol  within 24 hours.

## 2024-02-09 DIAGNOSIS — N182 Chronic kidney disease, stage 2 (mild): Secondary | ICD-10-CM | POA: Diagnosis not present

## 2024-02-09 DIAGNOSIS — F0390 Unspecified dementia without behavioral disturbance: Secondary | ICD-10-CM | POA: Diagnosis not present

## 2024-02-09 DIAGNOSIS — I129 Hypertensive chronic kidney disease with stage 1 through stage 4 chronic kidney disease, or unspecified chronic kidney disease: Secondary | ICD-10-CM | POA: Diagnosis not present

## 2024-02-09 DIAGNOSIS — R2681 Unsteadiness on feet: Secondary | ICD-10-CM | POA: Diagnosis not present

## 2024-02-09 DIAGNOSIS — W07XXXA Fall from chair, initial encounter: Secondary | ICD-10-CM | POA: Diagnosis not present

## 2024-02-09 DIAGNOSIS — M6281 Muscle weakness (generalized): Secondary | ICD-10-CM | POA: Diagnosis not present

## 2024-02-09 DIAGNOSIS — Z9181 History of falling: Secondary | ICD-10-CM | POA: Diagnosis not present

## 2024-02-10 DIAGNOSIS — R2681 Unsteadiness on feet: Secondary | ICD-10-CM | POA: Diagnosis not present

## 2024-02-10 DIAGNOSIS — M6281 Muscle weakness (generalized): Secondary | ICD-10-CM | POA: Diagnosis not present

## 2024-02-11 ENCOUNTER — Emergency Department (HOSPITAL_COMMUNITY)
Admission: EM | Admit: 2024-02-11 | Discharge: 2024-02-12 | Disposition: A | Discharge status: Skilled Nursing Facility | Attending: Emergency Medicine | Admitting: Emergency Medicine

## 2024-02-11 DIAGNOSIS — Z043 Encounter for examination and observation following other accident: Secondary | ICD-10-CM | POA: Insufficient documentation

## 2024-02-11 DIAGNOSIS — I10 Essential (primary) hypertension: Secondary | ICD-10-CM | POA: Insufficient documentation

## 2024-02-11 DIAGNOSIS — W06XXXA Fall from bed, initial encounter: Secondary | ICD-10-CM | POA: Insufficient documentation

## 2024-02-11 DIAGNOSIS — W19XXXA Unspecified fall, initial encounter: Secondary | ICD-10-CM | POA: Diagnosis not present

## 2024-02-11 DIAGNOSIS — R001 Bradycardia, unspecified: Secondary | ICD-10-CM | POA: Diagnosis not present

## 2024-02-11 DIAGNOSIS — J449 Chronic obstructive pulmonary disease, unspecified: Secondary | ICD-10-CM | POA: Diagnosis not present

## 2024-02-11 DIAGNOSIS — S0990XA Unspecified injury of head, initial encounter: Secondary | ICD-10-CM | POA: Insufficient documentation

## 2024-02-11 DIAGNOSIS — R0902 Hypoxemia: Secondary | ICD-10-CM | POA: Diagnosis not present

## 2024-02-11 NOTE — ED Provider Notes (Signed)
 [place] EMERGENCY DEPARTMENT AT [facility] Provider Note   CSN: [id number] Arrival date & time: 02/11/24  2349     History No chief complaint on file.   HPI [facility] is a 83 y.o. male presenting for chief complaint of fall. HX: hypertension, hyperlipidemia, GERD, COPD on 2L at baseline  Patient's recorded medical, surgical, social, medication list and allergies were reviewed in the Snapshot window as part of the initial history.   Review of Systems   Review of Systems  Physical Exam Updated Vital Signs There were no vitals taken for this visit. Physical Exam Vitals and nursing note reviewed.  Constitutional:      General: He is not in acute distress.    Appearance: He is well-developed.  HENT:     Head: Normocephalic and atraumatic.  Eyes:     Conjunctiva/sclera: Conjunctivae normal.  Cardiovascular:     Rate and Rhythm: Normal rate and regular rhythm.  Pulmonary:     Effort: Pulmonary effort is normal. No respiratory distress.  Abdominal:     General: Abdomen is flat. There is no distension.  Musculoskeletal:        General: No swelling or deformity.  Skin:    General: Skin is warm and dry.     Capillary Refill: Capillary refill takes less than 2 seconds.  Neurological:     Mental Status: He is alert and oriented to person, place, and time. Mental status is at baseline.      ED Course/ Medical Decision Making/ A&P    Procedures Procedures   Medications Ordered in ED Medications - No data to display  Medical Decision Making:   [name] is a 83 y.o. male who presented to the ED today with *** detailed above.    {crccomplexity:27900} Complete initial physical exam performed, notably the patient  was ***.    Reviewed and confirmed nursing documentation for past medical history, family history, social history.    Initial Assessment:   With the patient's presentation of ***, most likely diagnosis is ***. Other diagnoses were  considered including (but not limited to) ***. These are considered less likely due to history of present illness and physical exam findings.   {crccopa:27899}  Initial Plan:  ***  ***Screening labs including CBC and Metabolic panel to evaluate for infectious or metabolic etiology of disease.  ***Urinalysis with reflex culture ordered to evaluate for UTI or relevant urologic/nephrologic pathology.  ***CXR to evaluate for structural/infectious intrathoracic pathology.  {crccardiactesting:32591::EKG to evaluate for cardiac pathology} Objective evaluation as below reviewed   Initial Study Results:   Laboratory  All laboratory results reviewed without evidence of clinically relevant pathology.   ***Exceptions include: ***   ***EKG EKG was reviewed independently. Rate, rhythm, axis, intervals all examined and without medically relevant abnormality. ST segments without concerns for elevations.    Radiology:  All images reviewed independently. ***Agree with radiology report at this time.   CT Cervical Spine Wo Contrast Result Date: 02/08/2024 EXAM: CT CERVICAL SPINE WITHOUT CONTRAST 02/08/2024 09:31:46 AM TECHNIQUE: CT of the cervical spine was performed without the administration of intravenous contrast. Multiplanar reformatted images are provided for review. Automated exposure control, iterative reconstruction, and/or weight based adjustment of the mA/kV was utilized to reduce the radiation dose to as low as reasonably achievable. COMPARISON: Head CT 02/08/2024 reported separately. Cervical spine CT 11/11/2023. CLINICAL HISTORY: 83 year old male. Neck trauma. FINDINGS: CERVICAL SPINE: BONES AND ALIGNMENT: No acute fracture or traumatic malalignment. Maintained cervical lordosis. Subtle  chronic anterolisthesis of C5 on C6 appears degenerative. Degenerative partially calcified ligamentous hypertrophy about the odontoid. Chronic anterior C1 odontoid degeneration stable background bone mineralization.  DEGENERATIVE CHANGES: Multilevel cervical facet arthropathy with vacuum facet on the left at C5-C6. Absent and carious dentition. Asymmetric advanced degeneration of the right sternoclavicular joint. Upper thoracic hyperostosis with interbody ankylosis. SOFT TISSUES: No prevertebral soft tissue swelling. Mild respiratory motion in the visible upper lungs. IMPRESSION: 1. No acute traumatic injury identified in the cervical spine. 2. Stable chronic cervical spine degeneration. Electronically signed by: Steven Hurst MD 02/08/2024 09:42 AM EST RP Workstation: [id number]   CT Head Wo Contrast Result Date: 02/08/2024 EXAM: CT HEAD WITHOUT CONTRAST 02/08/2024 09:31:46 AM TECHNIQUE: CT of the head was performed without the administration of intravenous contrast. Automated exposure control, iterative reconstruction, and/or weight based adjustment of the mA/kV was utilized to reduce the radiation dose to as low as reasonably achievable. COMPARISON: Brain MRI 07/28/2015. Head CT 11/11/2023. CLINICAL HISTORY: 83 year old male. Head trauma, minor (Age >= 65y). FINDINGS: BRAIN AND VENTRICLES: No acute hemorrhage. No evidence of acute infarct. No hydrocephalus. No extra-axial collection. No mass effect or midline shift. Stable brain volume. 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 Chronic patchy and confluent bilateral cerebral white matter hypodensity. Chronic lacunar infarct left thalamus. Stable gray white differentiation. Calcified atherosclerosis at the skull base. No suspicious intracranial vascular hyperdensity. ORBITS: No acute abnormality. Orbital soft tissues appear stable. SINUSES: Visible paranasal sinuses, middle ears and mastoids remain well aerated. SOFT TISSUES AND SKULL: Left anterior convexity scalp soft tissue swelling and stranding series 5 image 31. Underlying left humeral bone appears intact. No scalp soft tissue gas. No skull fracture. IMPRESSION: 1. Scalp  soft tissue injury. 2. No acute intracranial abnormality. 3. Stable non-contrast CT appearance of chronic small vessel disease, mesial temporal lobe atrophy. Electronically signed by: Steven Hurst MD 02/08/2024 09:36 AM EST RP Workstation: [id number]      Consults: Case discussed with ***.   Reassessment and Plan:   ***    ***  Clinical Impression: No diagnosis found.   Data Unavailable   Final Clinical Impression(s) / ED Diagnoses Final diagnoses:  None    Rx / DC Orders ED Discharge Orders     None

## 2024-02-12 ENCOUNTER — Emergency Department (HOSPITAL_COMMUNITY)

## 2024-02-12 ENCOUNTER — Other Ambulatory Visit: Payer: Self-pay

## 2024-02-12 ENCOUNTER — Encounter (HOSPITAL_COMMUNITY): Payer: Self-pay

## 2024-02-12 DIAGNOSIS — Z7401 Bed confinement status: Secondary | ICD-10-CM | POA: Diagnosis not present

## 2024-02-12 DIAGNOSIS — S0990XA Unspecified injury of head, initial encounter: Secondary | ICD-10-CM | POA: Diagnosis not present

## 2024-02-12 DIAGNOSIS — M549 Dorsalgia, unspecified: Secondary | ICD-10-CM | POA: Diagnosis not present

## 2024-02-12 DIAGNOSIS — Z043 Encounter for examination and observation following other accident: Secondary | ICD-10-CM | POA: Diagnosis not present

## 2024-02-12 NOTE — ED Triage Notes (Signed)
 Pt bib ems d/t patient falling off edge of bed and hitting face-mechanical fall. No complaints of pain.   Pt not on thinners, afib controlled  136/88 91% RA placed on 3L-100% 41 HR O2 as needed Cbg: 136

## 2024-02-12 NOTE — ED Notes (Signed)
[facility] transport setup for pt

## 2024-02-12 NOTE — ED Notes (Signed)
 Increased o2 from 3 to 4L. Pt sat was at 92 with complaint of not breathing well. O2 increased to 96% on 4L.

## 2024-02-13 DIAGNOSIS — R2681 Unsteadiness on feet: Secondary | ICD-10-CM | POA: Diagnosis not present

## 2024-02-13 DIAGNOSIS — E1122 Type 2 diabetes mellitus with diabetic chronic kidney disease: Secondary | ICD-10-CM | POA: Diagnosis not present

## 2024-02-13 DIAGNOSIS — N182 Chronic kidney disease, stage 2 (mild): Secondary | ICD-10-CM | POA: Diagnosis not present

## 2024-02-13 DIAGNOSIS — I129 Hypertensive chronic kidney disease with stage 1 through stage 4 chronic kidney disease, or unspecified chronic kidney disease: Secondary | ICD-10-CM | POA: Diagnosis not present

## 2024-02-13 DIAGNOSIS — K219 Gastro-esophageal reflux disease without esophagitis: Secondary | ICD-10-CM | POA: Diagnosis not present

## 2024-02-13 DIAGNOSIS — M6281 Muscle weakness (generalized): Secondary | ICD-10-CM | POA: Diagnosis not present

## 2024-02-13 DIAGNOSIS — R296 Repeated falls: Secondary | ICD-10-CM | POA: Diagnosis not present

## 2024-02-13 DIAGNOSIS — J449 Chronic obstructive pulmonary disease, unspecified: Secondary | ICD-10-CM | POA: Diagnosis not present

## 2024-02-14 DIAGNOSIS — I10 Essential (primary) hypertension: Secondary | ICD-10-CM | POA: Diagnosis not present

## 2024-02-14 DIAGNOSIS — R2681 Unsteadiness on feet: Secondary | ICD-10-CM | POA: Diagnosis not present

## 2024-02-14 DIAGNOSIS — J449 Chronic obstructive pulmonary disease, unspecified: Secondary | ICD-10-CM | POA: Diagnosis not present

## 2024-02-14 DIAGNOSIS — E119 Type 2 diabetes mellitus without complications: Secondary | ICD-10-CM | POA: Diagnosis not present

## 2024-02-14 DIAGNOSIS — M6281 Muscle weakness (generalized): Secondary | ICD-10-CM | POA: Diagnosis not present

## 2024-02-16 DIAGNOSIS — R2681 Unsteadiness on feet: Secondary | ICD-10-CM | POA: Diagnosis not present

## 2024-02-16 DIAGNOSIS — M6281 Muscle weakness (generalized): Secondary | ICD-10-CM | POA: Diagnosis not present

## 2024-02-20 DIAGNOSIS — R2681 Unsteadiness on feet: Secondary | ICD-10-CM | POA: Diagnosis not present

## 2024-02-20 DIAGNOSIS — M6281 Muscle weakness (generalized): Secondary | ICD-10-CM | POA: Diagnosis not present

## 2024-02-21 DIAGNOSIS — R2681 Unsteadiness on feet: Secondary | ICD-10-CM | POA: Diagnosis not present

## 2024-02-21 DIAGNOSIS — M6281 Muscle weakness (generalized): Secondary | ICD-10-CM | POA: Diagnosis not present

## 2024-02-22 DIAGNOSIS — R2681 Unsteadiness on feet: Secondary | ICD-10-CM | POA: Diagnosis not present

## 2024-02-22 DIAGNOSIS — M6281 Muscle weakness (generalized): Secondary | ICD-10-CM | POA: Diagnosis not present

## 2024-02-23 DIAGNOSIS — M6281 Muscle weakness (generalized): Secondary | ICD-10-CM | POA: Diagnosis not present

## 2024-02-23 DIAGNOSIS — R2681 Unsteadiness on feet: Secondary | ICD-10-CM | POA: Diagnosis not present

## 2024-02-24 DIAGNOSIS — M6281 Muscle weakness (generalized): Secondary | ICD-10-CM | POA: Diagnosis not present

## 2024-02-24 DIAGNOSIS — R2681 Unsteadiness on feet: Secondary | ICD-10-CM | POA: Diagnosis not present

## 2024-02-27 DIAGNOSIS — J449 Chronic obstructive pulmonary disease, unspecified: Secondary | ICD-10-CM | POA: Diagnosis not present

## 2024-02-27 DIAGNOSIS — N390 Urinary tract infection, site not specified: Secondary | ICD-10-CM | POA: Diagnosis not present

## 2024-02-28 DIAGNOSIS — F22 Delusional disorders: Secondary | ICD-10-CM | POA: Diagnosis not present

## 2024-02-28 DIAGNOSIS — F0390 Unspecified dementia without behavioral disturbance: Secondary | ICD-10-CM | POA: Diagnosis not present

## 2024-02-28 DIAGNOSIS — F5101 Primary insomnia: Secondary | ICD-10-CM | POA: Diagnosis not present

## 2024-02-28 DIAGNOSIS — R2681 Unsteadiness on feet: Secondary | ICD-10-CM | POA: Diagnosis not present

## 2024-02-28 DIAGNOSIS — M6281 Muscle weakness (generalized): Secondary | ICD-10-CM | POA: Diagnosis not present

## 2024-02-29 DIAGNOSIS — M6281 Muscle weakness (generalized): Secondary | ICD-10-CM | POA: Diagnosis not present

## 2024-02-29 DIAGNOSIS — R2681 Unsteadiness on feet: Secondary | ICD-10-CM | POA: Diagnosis not present

## 2024-03-01 DIAGNOSIS — M6281 Muscle weakness (generalized): Secondary | ICD-10-CM | POA: Diagnosis not present

## 2024-03-01 DIAGNOSIS — R2681 Unsteadiness on feet: Secondary | ICD-10-CM | POA: Diagnosis not present

## 2024-03-02 DIAGNOSIS — R2681 Unsteadiness on feet: Secondary | ICD-10-CM | POA: Diagnosis not present

## 2024-03-02 DIAGNOSIS — M6281 Muscle weakness (generalized): Secondary | ICD-10-CM | POA: Diagnosis not present

## 2024-03-06 DIAGNOSIS — N390 Urinary tract infection, site not specified: Secondary | ICD-10-CM | POA: Diagnosis not present

## 2024-03-16 DIAGNOSIS — I517 Cardiomegaly: Secondary | ICD-10-CM | POA: Diagnosis not present

## 2024-03-16 DIAGNOSIS — R062 Wheezing: Secondary | ICD-10-CM | POA: Diagnosis not present

## 2024-03-18 ENCOUNTER — Emergency Department (HOSPITAL_COMMUNITY)
Admission: EM | Admit: 2024-03-18 | Discharge: 2024-03-19 | Disposition: A | Discharge status: Skilled Nursing Facility | Attending: Emergency Medicine | Admitting: Emergency Medicine

## 2024-03-18 DIAGNOSIS — R309 Painful micturition, unspecified: Secondary | ICD-10-CM | POA: Diagnosis present

## 2024-03-18 DIAGNOSIS — N390 Urinary tract infection, site not specified: Secondary | ICD-10-CM

## 2024-03-18 DIAGNOSIS — B9689 Other specified bacterial agents as the cause of diseases classified elsewhere: Secondary | ICD-10-CM | POA: Diagnosis not present

## 2024-03-18 DIAGNOSIS — R062 Wheezing: Secondary | ICD-10-CM | POA: Diagnosis not present

## 2024-03-18 DIAGNOSIS — Z9981 Dependence on supplemental oxygen: Secondary | ICD-10-CM | POA: Diagnosis not present

## 2024-03-18 LAB — URINALYSIS, W/ REFLEX TO CULTURE (INFECTION SUSPECTED)
Bilirubin Urine: NEGATIVE
Glucose, UA: NEGATIVE mg/dL
Ketones, ur: NEGATIVE mg/dL
Nitrite: NEGATIVE
Protein, ur: 100 mg/dL — AB
Specific Gravity, Urine: 1.016 (ref 1.005–1.030)
WBC, UA: >50 WBC/hpf (ref 0–5)
pH: 6 (ref 5.0–8.0)

## 2024-03-18 MED ORDER — ALBUTEROL SULFATE (2.5 MG/3ML) 0.083% IN NEBU
10.0000 mg/h | INHALATION_SOLUTION | RESPIRATORY_TRACT | Status: AC
  Administered 2024-03-18: 10 mg/h via RESPIRATORY_TRACT
  Filled 2024-03-18: qty 12

## 2024-03-18 MED ORDER — SULFAMETHOXAZOLE-TRIMETHOPRIM 800-160 MG PO TABS: 1.0000 | ORAL_TABLET | Freq: Two times a day (BID) | ORAL | 0 refills | Status: AC

## 2024-03-18 MED ORDER — SULFAMETHOXAZOLE-TRIMETHOPRIM 800-160 MG PO TABS: 1.0000 | ORAL_TABLET | Freq: Two times a day (BID) | ORAL | 0 refills | Status: DC

## 2024-03-18 MED ORDER — SULFAMETHOXAZOLE-TRIMETHOPRIM 800-160 MG PO TABS
1.0000 | ORAL_TABLET | Freq: Once | ORAL | Status: AC
  Administered 2024-03-18: 1 via ORAL
  Filled 2024-03-18: qty 1

## 2024-03-18 MED ORDER — PREDNISONE 20 MG PO TABS
60.0000 mg | ORAL_TABLET | ORAL | Status: AC
  Administered 2024-03-18: 60 mg via ORAL
  Filled 2024-03-18: qty 3

## 2024-03-18 NOTE — Discharge Instructions (Addendum)
 You have been diagnosed with a urinary tract infection.  As long as you continue to previous urine, it is appropriate to take your antibiotics as prescribed and follow-up with your physician.  If you develop new inability to urinate, return here for evaluation, possible catheterization.  Return here for any concerning changes in your condition.

## 2024-03-18 NOTE — ED Notes (Signed)
 [facility] called and patient placed back on the list for transport home

## 2024-03-18 NOTE — ED Notes (Signed)
 [facility] called

## 2024-03-18 NOTE — ED Provider Notes (Addendum)
 [facility] EM[facility]Y DEPARTMENT AT [facility] Provider Note   CSN: [id number] Arrival date & time: 03/18/24  1349     Patient presents with: Urinary Retention   Steven Simmons is a 83 y.o. male.   HPI Patient BIB ems from [facility] Patient reported to EMS he has fear of not being able to urinate. Ems says upon arrival patient was found sitting in decent amount of urine No pain, no UTI symtpoms Patient has asked for urinal in triage    Patient reports to this nurse that he has had some pain with urination, difficulty starting stream. X 3 days.  He himself notes he has had Foley catheter in the past, though not for quite some time.  He denies discomfort anywhere other than his suprapubic region, does not and he has been able to urinate small amounts over the past few days.    Prior to Admission medications  Medication Sig Start Date End Date Taking? Authorizing Provider  acetaminophen  (TYLENOL ) 325 MG tablet Take 2 tablets (650 mg total) by mouth every 6 (six) hours as needed for mild pain (or Fever >/= 101). Patient taking differently: Take 650 mg by mouth in the morning and at bedtime. 06/14/22   Sherrill Cable Latif, DO  acetaminophen  (TYLENOL ) 325 MG tablet Take 650 mg by mouth every 6 (six) hours as needed for mild pain (pain score 1-3), moderate pain (pain score 4-6) or fever.    [provider]  albuterol  (VENTOLIN  HFA) 108 (90 Base) MCG/ACT inhaler Inhale 2 puffs into the lungs every 4 (four) hours as needed for wheezing or shortness of breath (or coughing). 12/25/22   Raford Lenis, MD  calcium  carbonate (TUMS - DOSED IN MG ELEMENTAL CALCIUM ) 500 MG chewable tablet Chew 1,000 mg by mouth 3 (three) times daily as needed for indigestion.    [provider]  Cholecalciferol (VITAMIN D) 50 MCG (2000 UT) CAPS Take 2,000 Units by mouth daily.    [provider]  cloNIDine  (CATAPRES ) 0.1 MG tablet Take 0.1 mg by mouth daily as needed (SBP  >180).    [provider]  diltiazem  (CARDIZEM  CD) 120 MG 24 hr capsule Take 1 capsule (120 mg total) by mouth daily. 03/03/22 09/21/23  Jerrol Agent, MD  famotidine  (PEPCID ) 20 MG tablet Take 1 tablet (20 mg total) by mouth at bedtime. 01/13/23   Kerman Vina HERO, NP  fluconazole  (DIFLUCAN ) 150 MG tablet Take 150 mg by mouth once. Patient not taking: Reported on 09/21/2023    [provider]  fluconazole  (DIFLUCAN ) 200 MG tablet Take 200 mg by mouth once.    [provider]  hydrochlorothiazide  (MICROZIDE ) 12.5 MG capsule Take 12.5 mg by mouth daily.    [provider]  ipratropium-albuterol  (DUONEB) 0.5-2.5 (3) MG/3ML SOLN Take 3 mLs by nebulization 2 (two) times daily as needed (for shortness of breath, coughing, or wheezing).    [provider]  labetalol  (NORMODYNE ) 100 MG tablet Take 1 tablet (100 mg total) by mouth 2 (two) times daily. 01/17/23   Raford Lenis, MD  loperamide (IMODIUM A-D) 2 MG tablet Take 2-4 mg by mouth See admin instructions. Take 4 mg by mouth at onset of diarrhea, then 2 mg after each subsequent stool- not to exceed 16 grams/24 hours; call MD if no relief    [provider]  metFORMIN (GLUCOPHAGE) 500 MG tablet Take 500 mg by mouth 2 (two) times daily with a meal.    [provider]  nitrofurantoin , macrocrystal-monohydrate, (MACROBID ) 100 MG capsule Take 1 capsule (100 mg total) by mouth 2 (two) times daily. 09/21/23   Dasie Faden, MD  OXYGEN Inhale 1-5 L/min into the lungs See admin instructions. 1-5 L/min via nasal cannula and may titrate to keep sats >90% Patient not taking: Reported on 09/21/2023    [provider]  pantoprazole  (PROTONIX ) 40 MG tablet Take 1 tablet (40 mg total) by mouth daily. 05/20/22 09/21/23  Emelia Sluder, PA-C  polyethylene glycol powder (GLYCOLAX /MIRALAX ) 17 GM/SCOOP powder Take 17 g by mouth daily as needed for mild constipation or moderate constipation.    [provider]  potassium chloride  (KLOR-CON ) 10 MEQ tablet Take 10 mEq by mouth daily.    [provider]  potassium chloride  SA (KLOR-CON  M) 20 MEQ tablet Take 1 tablet twice a day for 10 days, then take 1 tablet once daily Patient not taking: Reported on 09/21/2023 01/17/23   Raford Lenis, MD  Skin Protectants, Misc. (EUCERIN) cream Apply 1 Application topically in the morning and at bedtime.    [provider]  sucralfate  (CARAFATE ) 1 g tablet Take 1 tablet (1 g total) by mouth 4 (four) times daily -  with meals and at bedtime. Patient taking differently: Take 1 g by mouth 4 (four) times daily as needed (acid reflux). 06/14/22   Sheikh, Alejandro Latif, DO  sulfamethoxazole -trimethoprim  (BACTRIM  DS) 800-160 MG tablet Take 1 tablet by mouth 2 (two) times daily for 21 days. 03/18/24 04/08/24  Garrick Charleston, MD    Allergies: Ceclor [cefaclor]; Veralipride; Penicillins; Amlodipine ; Antihistamines, chlorpheniramine-type; Beta adrenergic blockers; Celebrex [celecoxib]; Ibuprofen; Lipitor [atorvastatin ]; and Nutrasweet aspartame [aspartame]    Review of Systems  Updated Vital Signs BP (!) 179/92   Pulse 77   Temp 98.1 F (36.7 C) (Oral)   Resp 16   SpO2 91%   Physical Exam Vitals and nursing note reviewed.  Constitutional:      General: He is not in acute distress.    Appearance: He is well-developed.  HENT:     Head: Normocephalic and atraumatic.  Eyes:     Conjunctiva/sclera: Conjunctivae normal.  Cardiovascular:     Rate and Rhythm: Normal rate and regular rhythm.  Pulmonary:     Effort: Pulmonary effort is normal. No respiratory distress.     Breath sounds: No stridor.  Abdominal:     General: There is no distension.     Tenderness: There is no abdominal tenderness.  Skin:    General: Skin is warm and dry.  Neurological:     Mental Status: He is alert and oriented to person, place, and time.     (all labs ordered are listed, but only abnormal results are  displayed) Labs Reviewed  URINALYSIS, W/ REFLEX TO CULTURE (INFECTION SUSPECTED) - Abnormal; Notable for the following components:      Result Value   APPearance CLOUDY (*)    Hgb urine dipstick SMALL (*)    Protein, ur 100 (*)    Leukocytes,Ua LARGE (*)    Bacteria, UA RARE (*)    Non Squamous Epithelial 0-5 (*)    All other components within normal limits  URINE CULTURE    EKG: None  Radiology: No results found.   Procedures   Medications Ordered in the ED  sulfamethoxazole -trimethoprim  (BACTRIM  DS) 800-160 MG per tablet 1 tablet (has no administration in time range)  Medical Decision Making Adult male presents with several days of difficulty urinating, though there is evidence for gross urination at his facility, and again here he has been able to produce urine. Bedside ultrasound performed after my initial evaluation with nurse demonstrates minimal fluid in the bladder. Patient is hemodynamically unremarkable, is awake, alert, without other complaints, no evidence for bacteremia, sepsis.  Urinalysis suggestive of infection, patient started on antibiotics, will return to his nursing facility with ongoing antibiotics to follow-up with his urologist.  Amount and/or Complexity of Data Reviewed Independent Historian: EMS Labs: ordered. Decision-making details documented in ED Course.  Risk Prescription drug management. Decision regarding hospitalization. Diagnosis or treatment significantly limited by social determinants of health.   9:16 PM Transferred to gurney for return to his nursing facility patient has audible wheezing.  Breathing treatment offered, prednisone  offered. Patient has home 24/7 oxygen, on arrival here he was not wearing this.  10:26 PM Patient calm, sitting upright, receiving bronchodilator as he had wheezing, which has improved.  Patient will return to nursing facility.  Final diagnoses:  Lower urinary tract  infectious disease    ED Discharge Orders          Ordered    sulfamethoxazole -trimethoprim  (BACTRIM  DS) 800-160 MG tablet  2 times daily,   Status:  Discontinued        03/18/24 1751    sulfamethoxazole -trimethoprim  (BACTRIM  DS) 800-160 MG tablet  2 times daily        03/18/24 1752               Garrick Charleston, MD 03/18/24 1752    Garrick Charleston, MD 03/18/24 2226    Garrick Charleston, MD 03/18/24 2226

## 2024-03-18 NOTE — ED Triage Notes (Addendum)
 Patient BIB ems from [facility] Patient reported to EMS he has fear of not being able to urinate. Ems says upon arrival patient was found sitting in decent amount of urine No pain, no UTI symtpoms Patient has asked for urinal in triage   Patient reports to this nurse that he has had some pain with urination, difficulty starting stream. X 3 days.

## 2024-03-20 LAB — URINE CULTURE: Culture: >=100000 — AB

## 2024-03-21 ENCOUNTER — Telehealth (HOSPITAL_BASED_OUTPATIENT_CLINIC_OR_DEPARTMENT_OTHER): Payer: Self-pay | Admitting: *Deleted

## 2024-03-21 NOTE — Telephone Encounter (Signed)
 Post ED Visit - Positive Culture Follow-up  Culture report reviewed by antimicrobial stewardship pharmacist: [facility] []  Rankin Dee, Pharm.D. []  Venetia Gully, Pharm.D., BCPS AQ-ID []  Garrel Crews, Pharm.D., BCPS []  Almarie Lunger, Pharm.D., BCPS []  [place], [place].D., BCPS, AAHIVP []  Rosaline Bihari, Pharm.D., BCPS, AAHIVP []  Vernell Meier, PharmD, BCPS []  Latanya Hint, PharmD, BCPS []  Donald Medley, PharmD, BCPS []  Rocky Bold, PharmD []  Dorothyann Alert, PharmD, BCPS []  Morene Babe, PharmD  [facility] [x]  Rosaline Edison, PharmD []  Romona Bliss, PharmD []  Dolphus Roller, PharmD []  Veva Seip, Rph []  Vernell Daunt) Leonce, PharmD []  Eva Allis, PharmD []  Rosaline Millet, PharmD []  Iantha Batch, PharmD []  Arvin Gauss, PharmD []  Wanda Hasting, PharmD []  Ronal Rav, PharmD []  Rocky Slade, PharmD []  Bard Jeans, PharmD   Positive urine culture Treated with sulfamethoxazole , organism sensitive to the same and no further patient follow-up is required at this time.  Steven Simmons 03/21/2024, 9:55 AM
